# Patient Record
Sex: Male | Born: 1945 | Race: Black or African American | Hispanic: No | Marital: Married | Smoking: Former smoker
Health system: Southern US, Community
[De-identification: ages and names within clinical notes are randomized; demographics above are authoritative.]

## PROBLEM LIST (undated history)

## (undated) DIAGNOSIS — N183 Chronic kidney disease, stage 3 unspecified: Secondary | ICD-10-CM

## (undated) DIAGNOSIS — D649 Anemia, unspecified: Secondary | ICD-10-CM

## (undated) DIAGNOSIS — R339 Retention of urine, unspecified: Secondary | ICD-10-CM

## (undated) DIAGNOSIS — I10 Essential (primary) hypertension: Secondary | ICD-10-CM

## (undated) DIAGNOSIS — M109 Gout, unspecified: Secondary | ICD-10-CM

---

## 2004-02-07 ENCOUNTER — Emergency Department: Payer: Self-pay | Admitting: Emergency Medicine

## 2004-02-07 ENCOUNTER — Other Ambulatory Visit: Payer: Self-pay

## 2004-08-20 ENCOUNTER — Other Ambulatory Visit: Payer: Self-pay

## 2004-08-20 ENCOUNTER — Emergency Department: Payer: Self-pay | Admitting: Emergency Medicine

## 2005-05-19 ENCOUNTER — Emergency Department: Payer: Self-pay | Admitting: Emergency Medicine

## 2005-05-19 ENCOUNTER — Other Ambulatory Visit: Payer: Self-pay

## 2005-05-25 ENCOUNTER — Emergency Department: Payer: Self-pay | Admitting: Emergency Medicine

## 2005-07-30 ENCOUNTER — Emergency Department: Payer: Self-pay | Admitting: Emergency Medicine

## 2005-07-30 ENCOUNTER — Other Ambulatory Visit: Payer: Self-pay

## 2005-09-26 ENCOUNTER — Other Ambulatory Visit: Payer: Self-pay

## 2005-09-26 ENCOUNTER — Inpatient Hospital Stay: Payer: Self-pay | Admitting: Internal Medicine

## 2006-06-30 ENCOUNTER — Emergency Department: Payer: Self-pay | Admitting: Emergency Medicine

## 2006-06-30 ENCOUNTER — Other Ambulatory Visit: Payer: Self-pay

## 2006-10-14 ENCOUNTER — Emergency Department: Payer: Self-pay | Admitting: Emergency Medicine

## 2006-11-05 ENCOUNTER — Other Ambulatory Visit: Payer: Self-pay

## 2006-11-05 ENCOUNTER — Emergency Department: Payer: Self-pay | Admitting: Emergency Medicine

## 2007-01-15 ENCOUNTER — Inpatient Hospital Stay: Payer: Self-pay | Admitting: Internal Medicine

## 2007-01-15 ENCOUNTER — Other Ambulatory Visit: Payer: Self-pay

## 2007-01-19 ENCOUNTER — Other Ambulatory Visit: Payer: Self-pay

## 2007-05-10 ENCOUNTER — Emergency Department: Payer: Self-pay | Admitting: Emergency Medicine

## 2007-05-10 ENCOUNTER — Other Ambulatory Visit: Payer: Self-pay

## 2008-01-24 ENCOUNTER — Emergency Department: Payer: Self-pay | Admitting: Emergency Medicine

## 2008-06-17 ENCOUNTER — Emergency Department: Payer: Self-pay | Admitting: Internal Medicine

## 2008-06-27 ENCOUNTER — Emergency Department: Payer: Self-pay | Admitting: Emergency Medicine

## 2011-03-23 ENCOUNTER — Emergency Department: Payer: Self-pay | Admitting: Emergency Medicine

## 2011-09-26 ENCOUNTER — Emergency Department: Payer: Self-pay | Admitting: Emergency Medicine

## 2011-09-26 LAB — CBC
HGB: 12.9 g/dL — ABNORMAL LOW (ref 13.0–18.0)
MCHC: 33.3 g/dL (ref 32.0–36.0)
MCV: 92 fL (ref 80–100)
Platelet: 110 10*3/uL — ABNORMAL LOW (ref 150–440)
RBC: 4.24 10*6/uL — ABNORMAL LOW (ref 4.40–5.90)
RDW: 16.1 % — ABNORMAL HIGH (ref 11.5–14.5)
WBC: 7.1 10*3/uL (ref 3.8–10.6)

## 2011-09-26 LAB — BASIC METABOLIC PANEL
Anion Gap: 15 (ref 7–16)
BUN: 10 mg/dL (ref 7–18)
Calcium, Total: 9.2 mg/dL (ref 8.5–10.1)
Co2: 21 mmol/L (ref 21–32)
EGFR (African American): 52 — ABNORMAL LOW
EGFR (Non-African Amer.): 45 — ABNORMAL LOW
Glucose: 211 mg/dL — ABNORMAL HIGH (ref 65–99)
Osmolality: 277 (ref 275–301)

## 2011-09-26 LAB — URINALYSIS, COMPLETE
Glucose,UR: 150 mg/dL (ref 0–75)
Leukocyte Esterase: NEGATIVE
Nitrite: NEGATIVE
Specific Gravity: 1.01 (ref 1.003–1.030)
WBC UR: 1 /HPF (ref 0–5)

## 2011-09-26 LAB — PRO B NATRIURETIC PEPTIDE: B-Type Natriuretic Peptide: 441 pg/mL — ABNORMAL HIGH (ref 0–125)

## 2014-03-09 DIAGNOSIS — D649 Anemia, unspecified: Secondary | ICD-10-CM | POA: Insufficient documentation

## 2014-03-09 DIAGNOSIS — N189 Chronic kidney disease, unspecified: Secondary | ICD-10-CM | POA: Insufficient documentation

## 2014-03-09 DIAGNOSIS — I10 Essential (primary) hypertension: Secondary | ICD-10-CM | POA: Insufficient documentation

## 2014-03-09 DIAGNOSIS — N184 Chronic kidney disease, stage 4 (severe): Secondary | ICD-10-CM | POA: Insufficient documentation

## 2016-02-19 DIAGNOSIS — R011 Cardiac murmur, unspecified: Secondary | ICD-10-CM | POA: Diagnosis not present

## 2016-02-19 DIAGNOSIS — N183 Chronic kidney disease, stage 3 (moderate): Secondary | ICD-10-CM | POA: Diagnosis not present

## 2016-02-19 DIAGNOSIS — I10 Essential (primary) hypertension: Secondary | ICD-10-CM | POA: Diagnosis not present

## 2016-02-19 DIAGNOSIS — Z23 Encounter for immunization: Secondary | ICD-10-CM | POA: Diagnosis not present

## 2016-02-19 DIAGNOSIS — M109 Gout, unspecified: Secondary | ICD-10-CM | POA: Diagnosis not present

## 2016-02-19 DIAGNOSIS — E785 Hyperlipidemia, unspecified: Secondary | ICD-10-CM | POA: Diagnosis not present

## 2017-04-16 DIAGNOSIS — Z0000 Encounter for general adult medical examination without abnormal findings: Secondary | ICD-10-CM | POA: Diagnosis not present

## 2017-04-16 DIAGNOSIS — I10 Essential (primary) hypertension: Secondary | ICD-10-CM | POA: Diagnosis not present

## 2017-04-16 DIAGNOSIS — E785 Hyperlipidemia, unspecified: Secondary | ICD-10-CM | POA: Diagnosis not present

## 2017-04-16 DIAGNOSIS — Z1389 Encounter for screening for other disorder: Secondary | ICD-10-CM | POA: Diagnosis not present

## 2017-07-29 ENCOUNTER — Emergency Department: Payer: Medicare HMO

## 2017-07-29 ENCOUNTER — Inpatient Hospital Stay
Admission: EM | Admit: 2017-07-29 | Discharge: 2017-07-31 | DRG: 682 | Disposition: A | Discharge status: Home / Self Care | Payer: Medicare HMO | Attending: Internal Medicine | Admitting: Internal Medicine

## 2017-07-29 ENCOUNTER — Other Ambulatory Visit: Payer: Self-pay

## 2017-07-29 DIAGNOSIS — Z87891 Personal history of nicotine dependence: Secondary | ICD-10-CM | POA: Diagnosis not present

## 2017-07-29 DIAGNOSIS — R3129 Other microscopic hematuria: Secondary | ICD-10-CM | POA: Diagnosis present

## 2017-07-29 DIAGNOSIS — N139 Obstructive and reflux uropathy, unspecified: Secondary | ICD-10-CM | POA: Diagnosis not present

## 2017-07-29 DIAGNOSIS — Z79899 Other long term (current) drug therapy: Secondary | ICD-10-CM

## 2017-07-29 DIAGNOSIS — G9341 Metabolic encephalopathy: Secondary | ICD-10-CM | POA: Diagnosis present

## 2017-07-29 DIAGNOSIS — E871 Hypo-osmolality and hyponatremia: Secondary | ICD-10-CM | POA: Diagnosis present

## 2017-07-29 DIAGNOSIS — R778 Other specified abnormalities of plasma proteins: Secondary | ICD-10-CM

## 2017-07-29 DIAGNOSIS — R319 Hematuria, unspecified: Secondary | ICD-10-CM | POA: Diagnosis not present

## 2017-07-29 DIAGNOSIS — K8020 Calculus of gallbladder without cholecystitis without obstruction: Secondary | ICD-10-CM | POA: Diagnosis present

## 2017-07-29 DIAGNOSIS — E876 Hypokalemia: Secondary | ICD-10-CM | POA: Diagnosis present

## 2017-07-29 DIAGNOSIS — N183 Chronic kidney disease, stage 3 (moderate): Secondary | ICD-10-CM | POA: Diagnosis present

## 2017-07-29 DIAGNOSIS — R079 Chest pain, unspecified: Secondary | ICD-10-CM | POA: Diagnosis not present

## 2017-07-29 DIAGNOSIS — R0602 Shortness of breath: Secondary | ICD-10-CM | POA: Diagnosis not present

## 2017-07-29 DIAGNOSIS — N2889 Other specified disorders of kidney and ureter: Secondary | ICD-10-CM | POA: Diagnosis present

## 2017-07-29 DIAGNOSIS — R339 Retention of urine, unspecified: Secondary | ICD-10-CM | POA: Diagnosis present

## 2017-07-29 DIAGNOSIS — K567 Ileus, unspecified: Secondary | ICD-10-CM

## 2017-07-29 DIAGNOSIS — N1339 Other hydronephrosis: Secondary | ICD-10-CM | POA: Diagnosis not present

## 2017-07-29 DIAGNOSIS — R0789 Other chest pain: Secondary | ICD-10-CM | POA: Diagnosis present

## 2017-07-29 DIAGNOSIS — E861 Hypovolemia: Secondary | ICD-10-CM | POA: Diagnosis present

## 2017-07-29 DIAGNOSIS — N1330 Unspecified hydronephrosis: Secondary | ICD-10-CM | POA: Diagnosis not present

## 2017-07-29 DIAGNOSIS — I129 Hypertensive chronic kidney disease with stage 1 through stage 4 chronic kidney disease, or unspecified chronic kidney disease: Secondary | ICD-10-CM | POA: Diagnosis present

## 2017-07-29 DIAGNOSIS — R809 Proteinuria, unspecified: Secondary | ICD-10-CM | POA: Diagnosis not present

## 2017-07-29 DIAGNOSIS — H9190 Unspecified hearing loss, unspecified ear: Secondary | ICD-10-CM | POA: Diagnosis present

## 2017-07-29 DIAGNOSIS — R7989 Other specified abnormal findings of blood chemistry: Secondary | ICD-10-CM

## 2017-07-29 DIAGNOSIS — N179 Acute kidney failure, unspecified: Secondary | ICD-10-CM | POA: Diagnosis not present

## 2017-07-29 HISTORY — DX: Chronic kidney disease, stage 3 (moderate): N18.3

## 2017-07-29 HISTORY — DX: Anemia, unspecified: D64.9

## 2017-07-29 HISTORY — DX: Chronic kidney disease, stage 3 unspecified: N18.30

## 2017-07-29 HISTORY — DX: Essential (primary) hypertension: I10

## 2017-07-29 LAB — CBC WITH DIFFERENTIAL/PLATELET
Basophils Absolute: 0 10*3/uL (ref 0–0.1)
Basophils Relative: 0 %
EOS ABS: 0 10*3/uL (ref 0–0.7)
EOS PCT: 0 %
HCT: 34.5 % — ABNORMAL LOW (ref 40.0–52.0)
Hemoglobin: 12 g/dL — ABNORMAL LOW (ref 13.0–18.0)
LYMPHS ABS: 0.6 10*3/uL — AB (ref 1.0–3.6)
Lymphocytes Relative: 7 %
MCH: 26.7 pg (ref 26.0–34.0)
MCHC: 34.7 g/dL (ref 32.0–36.0)
MCV: 77.1 fL — AB (ref 80.0–100.0)
MONOS PCT: 11 %
Monocytes Absolute: 1 10*3/uL (ref 0.2–1.0)
Neutro Abs: 7.7 10*3/uL — ABNORMAL HIGH (ref 1.4–6.5)
Neutrophils Relative %: 82 %
PLATELETS: 183 10*3/uL (ref 150–440)
RBC: 4.47 MIL/uL (ref 4.40–5.90)
RDW: 16.4 % — ABNORMAL HIGH (ref 11.5–14.5)
WBC: 9.4 10*3/uL (ref 3.8–10.6)

## 2017-07-29 LAB — URINALYSIS, COMPLETE (UACMP) WITH MICROSCOPIC
Bilirubin Urine: NEGATIVE
GLUCOSE, UA: NEGATIVE mg/dL
Ketones, ur: NEGATIVE mg/dL
Leukocytes, UA: NEGATIVE
NITRITE: NEGATIVE
PH: 6 (ref 5.0–8.0)
Protein, ur: 100 mg/dL — AB
SPECIFIC GRAVITY, URINE: 1.004 — AB (ref 1.005–1.030)
Squamous Epithelial / LPF: NONE SEEN (ref 0–5)

## 2017-07-29 LAB — COMPREHENSIVE METABOLIC PANEL
ALT: 10 U/L — AB (ref 17–63)
AST: 23 U/L (ref 15–41)
Albumin: 4 g/dL (ref 3.5–5.0)
Alkaline Phosphatase: 46 U/L (ref 38–126)
Anion gap: 13 (ref 5–15)
BILIRUBIN TOTAL: 0.7 mg/dL (ref 0.3–1.2)
BUN: 34 mg/dL — ABNORMAL HIGH (ref 6–20)
CHLORIDE: 87 mmol/L — AB (ref 101–111)
CO2: 19 mmol/L — ABNORMAL LOW (ref 22–32)
CREATININE: 3.08 mg/dL — AB (ref 0.61–1.24)
Calcium: 9.2 mg/dL (ref 8.9–10.3)
GFR, EST AFRICAN AMERICAN: 22 mL/min — AB (ref >60)
GFR, EST NON AFRICAN AMERICAN: 19 mL/min — AB (ref >60)
Glucose, Bld: 101 mg/dL — ABNORMAL HIGH (ref 65–99)
Potassium: 3.4 mmol/L — ABNORMAL LOW (ref 3.5–5.1)
Sodium: 119 mmol/L — CL (ref 135–145)
TOTAL PROTEIN: 7.8 g/dL (ref 6.5–8.1)

## 2017-07-29 LAB — CREATININE, SERUM
CREATININE: 3.19 mg/dL — AB (ref 0.61–1.24)
GFR calc Af Amer: 21 mL/min — ABNORMAL LOW (ref >60)
GFR calc non Af Amer: 18 mL/min — ABNORMAL LOW (ref >60)

## 2017-07-29 LAB — MAGNESIUM: MAGNESIUM: 1.7 mg/dL (ref 1.7–2.4)

## 2017-07-29 LAB — LIPASE, BLOOD: LIPASE: 41 U/L (ref 11–51)

## 2017-07-29 LAB — TROPONIN I: Troponin I: 0.03 ng/mL (ref <0.03)

## 2017-07-29 MED ORDER — SODIUM CHLORIDE 0.9 % IV SOLN
INTRAVENOUS | Status: DC
  Administered 2017-07-29 – 2017-07-30 (×3): via INTRAVENOUS

## 2017-07-29 MED ORDER — ACETAMINOPHEN 325 MG PO TABS: 650.0000 mg | ORAL_TABLET | Freq: Four times a day (QID) | ORAL | Status: DC | PRN

## 2017-07-29 MED ORDER — CARVEDILOL 25 MG PO TABS
25.0000 mg | ORAL_TABLET | Freq: Two times a day (BID) | ORAL | Status: DC
  Administered 2017-07-30 – 2017-07-31 (×3): 25 mg via ORAL
  Filled 2017-07-29 (×3): qty 1

## 2017-07-29 MED ORDER — ALBUTEROL SULFATE (2.5 MG/3ML) 0.083% IN NEBU: 2.5000 mg | INHALATION_SOLUTION | RESPIRATORY_TRACT | Status: DC | PRN

## 2017-07-29 MED ORDER — HYDROCODONE-ACETAMINOPHEN 5-325 MG PO TABS: 1.0000 | ORAL_TABLET | ORAL | Status: DC | PRN

## 2017-07-29 MED ORDER — DOXAZOSIN MESYLATE 2 MG PO TABS
2.0000 mg | ORAL_TABLET | Freq: Every day | ORAL | Status: DC
  Administered 2017-07-30 – 2017-07-31 (×2): 2 mg via ORAL
  Filled 2017-07-29 (×2): qty 1

## 2017-07-29 MED ORDER — HEPARIN SODIUM (PORCINE) 5000 UNIT/ML IJ SOLN
5000.0000 [IU] | Freq: Three times a day (TID) | INTRAMUSCULAR | Status: DC
  Administered 2017-07-29 – 2017-07-31 (×5): 5000 [IU] via SUBCUTANEOUS
  Filled 2017-07-29 (×5): qty 1

## 2017-07-29 MED ORDER — ACETAMINOPHEN 650 MG RE SUPP: 650.0000 mg | Freq: Four times a day (QID) | RECTAL | Status: DC | PRN

## 2017-07-29 MED ORDER — IOPAMIDOL (ISOVUE-300) INJECTION 61%: 30.0000 mL | Freq: Once | INTRAVENOUS | Status: DC | PRN

## 2017-07-29 MED ORDER — ONDANSETRON HCL 4 MG PO TABS: 4.0000 mg | ORAL_TABLET | Freq: Four times a day (QID) | ORAL | Status: DC | PRN

## 2017-07-29 MED ORDER — POTASSIUM CHLORIDE CRYS ER 20 MEQ PO TBCR
40.0000 meq | EXTENDED_RELEASE_TABLET | Freq: Once | ORAL | Status: AC
  Administered 2017-07-29: 40 meq via ORAL
  Filled 2017-07-29: qty 2

## 2017-07-29 MED ORDER — AMLODIPINE BESYLATE 5 MG PO TABS
10.0000 mg | ORAL_TABLET | Freq: Every day | ORAL | Status: DC
  Administered 2017-07-30 – 2017-07-31 (×2): 10 mg via ORAL
  Filled 2017-07-29 (×2): qty 2

## 2017-07-29 MED ORDER — PRAVASTATIN SODIUM 40 MG PO TABS
40.0000 mg | ORAL_TABLET | Freq: Every day | ORAL | Status: DC
  Administered 2017-07-30: 40 mg via ORAL
  Filled 2017-07-29: qty 1

## 2017-07-29 MED ORDER — SODIUM CHLORIDE 0.9 % IV SOLN
Freq: Once | INTRAVENOUS | Status: AC
  Administered 2017-07-29: 14:00:00 via INTRAVENOUS

## 2017-07-29 MED ORDER — SENNOSIDES-DOCUSATE SODIUM 8.6-50 MG PO TABS: 1.0000 | ORAL_TABLET | Freq: Every evening | ORAL | Status: DC | PRN

## 2017-07-29 MED ORDER — ONDANSETRON HCL 4 MG/2ML IJ SOLN: 4.0000 mg | Freq: Four times a day (QID) | INTRAMUSCULAR | Status: DC | PRN

## 2017-07-29 MED ORDER — BISACODYL 5 MG PO TBEC: 5.0000 mg | DELAYED_RELEASE_TABLET | Freq: Every day | ORAL | Status: DC | PRN

## 2017-07-29 NOTE — ED Triage Notes (Signed)
Pt c/o having substernal chest pain for the past 2-3 hours with SOB.

## 2017-07-29 NOTE — ED Notes (Signed)
Date and time results received: 07/29/17 1315  Test: Sodium Critical Value: 119  Test: Troponin Critical Value: 0.03  Name of Provider Notified: Cinda Quest, MD  Orders Received? Or Actions Taken?: See new order

## 2017-07-29 NOTE — Progress Notes (Signed)
Received consult for bladder distention with bilateral hydronephrosis presumably secondary to urinary retention.  Foley catheter is now in place.  I will see the patient nonurgently in the morning.  No further intervention at this time, we will follow-up labs tomorrow to assess for improvement in renal function with bladder drainage.    Hollice Espy, MD

## 2017-07-29 NOTE — ED Provider Notes (Signed)
[facility] Emergency Department Provider Note   ____________________________________________   First MD Initiated Contact with Patient 07/29/17 1116     (approximate)  I have reviewed the triage vital signs and the nursing notes.   HISTORY  Chief Complaint Chest Pain    HPI Troy Berry is a 72 y.o. male who comes in with his wife.  His wife says he was breathing hard yesterday.  Today he is breathing hard with exertion and has chest tightness in his chest since this morning.  It does not seem to be worse with exertion.  Shortness of breath may be worse with exertion no.  He also complains of some right upper quadrant pain on palpation on my exam.  This appeared to have just started now.  Low-grade fever he is not coughing though.  Past medical history significant for hypertension is also taking cholesterol-lowering medication.   Past Medical History:  Diagnosis Date  . Hypertension     There are no active problems to display for this patient.   History reviewed. No pertinent surgical history.  Prior to Admission medications   Medication Sig Start Date End Date Taking? Authorizing Provider  amLODipine (NORVASC) 10 MG tablet Take 10 mg by mouth daily.   Yes [provider]  carvedilol (COREG) 25 MG tablet Take 25 mg by mouth 2 (two) times daily with a meal.   Yes [provider]  doxazosin (CARDURA) 2 MG tablet Take 1 tablet by mouth daily. 07/16/17  Yes [provider]  lovastatin (MEVACOR) 40 MG tablet Take 1 tablet by mouth every evening. 07/16/17  Yes [provider]    Allergies Patient has no known allergies.  No family history on file.  Social History Social History   Tobacco Use  . Smoking status: Former Research scientist (life sciences)  . Smokeless tobacco: Never Used  Substance Use Topics  . Alcohol use: Yes  . Drug use: Never    Review of Systems  Constitutional: No fever/chills Eyes: No visual changes. ENT: No  sore throat. Cardiovascular: See HPI Respiratory: See HPI Gastrointestinal: Upper abdominal pain see HPI.  No nausea, no vomiting.  No diarrhea.  No constipation. Genitourinary: Negative for dysuria. Musculoskeletal: Negative for back pain. Skin: Negative for rash. Neurological: Negative for headaches, focal weakness  ____________________________________________   PHYSICAL EXAM:  VITAL SIGNS: ED Triage Vitals [07/29/17 1105]  Enc Vitals Group     BP (!) 175/97     Pulse Rate 83     Resp (!) 24     Temp 99.1 F (37.3 C)     Temp Source Oral     SpO2 96 %     Weight 210 lb (95.3 kg)     Height 5\' 11"  (1.803 m)     Head Circumference      Peak Flow      Pain Score 6     Pain Loc      Pain Edu?      Excl. in [place]?     Constitutional: Alert and oriented. Well appearing and in no acute distress. Eyes: Conjunctivae are normal. Head: Atraumatic. Nose: No congestion/rhinnorhea. Mouth/Throat: Mucous membranes are moist.  Oropharynx non-erythematous. Neck: No stridor. Cardiovascular: Normal rate, regular rhythm. Grossly normal heart sounds.  Good peripheral circulation. Respiratory: Normal respiratory effort.  No retractions. Lungs CTAB. Gastrointestinal: Soft and nontender except on palpation of the right upper quadrant where he says it hurts a little bit when I palpate fairly deeply. No distention. No  abdominal bruits. No CVA tenderness. Musculoskeletal: No lower extremity tenderness trace edema.   Neurologic: Slightly slowed speech and language. No gross focal neurologic deficits are appreciated.. Skin:  Skin is warm, dry and intact. No rash noted. Psychiatric: Mood and affect are normal. Speech and behavior are normal.  ____________________________________________   LABS (all labs ordered are listed, but only abnormal results are displayed)  Labs Reviewed  TROPONIN I - Abnormal; Notable for the following components:      Result Value   Troponin I 0.03 (*)    All other  components within normal limits  CBC WITH DIFFERENTIAL/PLATELET - Abnormal; Notable for the following components:   Hemoglobin 12.0 (*)    HCT 34.5 (*)    MCV 77.1 (*)    RDW 16.4 (*)    Neutro Abs 7.7 (*)    Lymphs Abs 0.6 (*)    All other components within normal limits  COMPREHENSIVE METABOLIC PANEL - Abnormal; Notable for the following components:   Sodium 119 (*)    Potassium 3.4 (*)    Chloride 87 (*)    CO2 19 (*)    Glucose, Bld 101 (*)    BUN 34 (*)    Creatinine, Ser 3.08 (*)    ALT 10 (*)    GFR calc non Af Amer 19 (*)    GFR calc Af Amer 22 (*)    All other components within normal limits  LIPASE, BLOOD  URINALYSIS, COMPLETE (UACMP) WITH MICROSCOPIC   ____________________________________________  EKG  EKG read interpreted by me shows normal sinus rhythm rate of 81 with first-degree AV block there is a suggestion of slight ST elevation but not significant elevation in lead II flipped T's in 1 and L these changes are not much different from previous EKG in 2013 ____________________________________________  RADIOLOGY  ED MD interpretatio chest x-ray shows no obvious acute pathology there is loops of dilated bowel however that probably explains patient's tenderness.  CT of the abdomen was done has multiple results which have copied below.  I have reviewed both films CT and chest x-ray Bilateral hydronephrosis. Urinary bladder is markedly distended into  the lower abdomen. Prostate enlargement.    Cholelithiasis.    Gaseous distention of the right colon and transverse colon. This  presumably is related to mild ileus. Colonic diverticulosis. No  active diverticulitis.    Diffuse severe aortoiliac atherosclerosis and branch vessel  atherosclerosis.    Trace bilateral effusions. Diffuse severe coronary artery disease.    Old granulomatous disease.       Official radiology report(s): Ct Abdomen Pelvis Wo Contrast  Result Date: 07/29/2017 CLINICAL  DATA:  Epigastric pain. Shortness of breath. Dilated bowel noted on chest x-ray. EXAM: CT ABDOMEN AND PELVIS WITHOUT CONTRAST TECHNIQUE: Multidetector CT imaging of the abdomen and pelvis was performed following the standard protocol without IV contrast. COMPARISON:  Chest x-ray performed today FINDINGS: Lower chest: Diffuse extensive coronary artery calcifications. Calcified mediastinal and hilar lymph nodes. Trace pleural effusions bilaterally. Hepatobiliary: Numerous layering gallstones within the gallbladder. Scattered hepatic calcifications. No visible suspicious hepatic abnormality. Pancreas: No focal abnormality or ductal dilatation. Spleen: No focal abnormality.  Normal size. Adrenals/Urinary Tract: Severe renovascular calcifications. Bilateral moderate hydronephrosis. Urinary bladder is markedly distended into the lower abdomen. No visible stones. Adrenal glands are unremarkable. Stomach/Bowel: Gaseous distension of the transverse colon, ascending colon and proximal descending colon. Scattered moderate colonic diverticulosis. No visible active diverticulitis. Moderate stool burden in the right colon. Stomach is moderately distended with  gas and debris. Small bowel decompressed, grossly unremarkable. Vascular/Lymphatic: Diffuse aortic, iliac and branch vessel calcifications. No aneurysm or adenopathy. Reproductive: Enlarged prostate. Other: No free fluid or free air. Musculoskeletal: Diffuse advanced degenerative disc and facet disease in the lumbar spine. IMPRESSION: Bilateral hydronephrosis. Urinary bladder is markedly distended into the lower abdomen. Prostate enlargement. Cholelithiasis. Gaseous distention of the right colon and transverse colon. This presumably is related to mild ileus. Colonic diverticulosis. No active diverticulitis. Diffuse severe aortoiliac atherosclerosis and branch vessel atherosclerosis. Trace bilateral effusions.  Diffuse severe coronary artery disease. Old granulomatous  disease. Electronically Signed   By: Rolm Baptise M.D.   On: 07/29/2017 13:33   Dg Chest 2 View  Result Date: 07/29/2017 CLINICAL DATA:  Chest pain and shortness of breath. EXAM: CHEST - 2 VIEW COMPARISON:  09/26/2011 and 03/23/2011 FINDINGS: Heart size and pulmonary vascularity are normal. Tortuosity of the thoracic aorta. Multiple calcified lymph nodes in the hilar regions, unchanged. No infiltrates or effusions. Slight linear scarring at the right lung base. Dilated bowel loops in the upper abdomen. IMPRESSION: No active cardiopulmonary disease.  Dilated bowel. Electronically Signed   By: Lorriane Shire M.D.   On: 07/29/2017 12:26    ____________________________________________   PROCEDURES  Procedure(s) performed:   Procedures  Critical Care performed: Critical care time 45 minutes including reviewing the films myself reviewing old records speaking with the patient in the hospital Dr.  ____________________________________________   INITIAL IMPRESSION / [place] / ED COURSE           ____________________________________________   FINAL CLINICAL IMPRESSION(S) / ED DIAGNOSES  Final diagnoses:  Chest pain, unspecified type  Elevated troponin  Other hydronephrosis  Ileus ([place])  Hyponatremia  AKI (acute kidney injury) [facility])     ED Discharge Orders    None       Note:  This document was prepared using Dragon voice recognition software and may include unintentional dictation errors.    Nena Polio, MD 07/29/17 1359

## 2017-07-29 NOTE — H&P (Addendum)
[facility] NAME: Troy Berry    MR#:  [id number]  DATE OF BIRTH:  29-Nov-1945  DATE OF ADMISSION:  07/29/2017  PRIMARY CARE PHYSICIAN: [facility] [place]   REQUESTING/REFERRING PHYSICIAN: Dr. Rip Harbour.  CHIEF COMPLAINT:   Chief Complaint  Patient presents with  . Chest Pain   Chest pain on the right side. HISTORY OF PRESENT ILLNESS:  Troy Berry  is a 72 y.o. male with a known history of hypertension.  The patient came to ED due to above chief complaints.  He looks confused, only complains of right-sided chest pain.  His wife said that he has been drinking a lot of water recently. He is found no sodium in the 119 and acute renal failure with elevated troponin at at 3.08. PAST MEDICAL HISTORY:   Past Medical History:  Diagnosis Date  . Anemia   . CKD (chronic kidney disease), stage III ([place])   . Hypertension     PAST SURGICAL HISTORY:  History reviewed. No pertinent surgical history.  SOCIAL HISTORY:   Social History   Tobacco Use  . Smoking status: Former Research scientist (life sciences)  . Smokeless tobacco: Never Used  Substance Use Topics  . Alcohol use: Yes    FAMILY HISTORY:   Family History  Problem Relation Age of Onset  . Cancer Father     DRUG ALLERGIES:  No Known Allergies  REVIEW OF SYSTEMS:   Review of Systems  Unable to perform ROS: Mental status change    MEDICATIONS AT HOME:   Prior to Admission medications   Medication Sig Start Date End Date Taking? Authorizing Provider  amLODipine (NORVASC) 10 MG tablet Take 10 mg by mouth daily.   Yes [provider]  carvedilol (COREG) 25 MG tablet Take 25 mg by mouth 2 (two) times daily with a meal.   Yes [provider]  doxazosin (CARDURA) 2 MG tablet Take 1 tablet by mouth daily. 07/16/17  Yes [provider]  lovastatin (MEVACOR) 40 MG tablet Take 1 tablet by mouth every evening. 07/16/17  Yes [provider]      VITAL SIGNS:  Blood pressure (!) 141/87, pulse 78, temperature 99.1 F (37.3 C), temperature source Oral, resp. rate 20, height 5\' 11"  (1.803 m), weight 210 lb (95.3 kg), SpO2 96 %.  PHYSICAL EXAMINATION:  Physical Exam  GENERAL:  72 y.o.-year-old patient lying in the bed with no acute distress.  EYES: Pupils equal, round, reactive to light and accommodation. No scleral icterus. Extraocular muscles intact.  HEENT: Head atraumatic, normocephalic. Oropharynx and nasopharynx clear.  NECK:  Supple, no jugular venous distention. No thyroid enlargement, no tenderness.  LUNGS: Normal breath sounds bilaterally, no wheezing, rales,rhonchi or crepitation. No use of accessory muscles of respiration.  CARDIOVASCULAR: S1, S2 normal. No murmurs, rubs, or gallops.  ABDOMEN: Soft, nontender, nondistended. Bowel sounds present. No organomegaly or mass.  EXTREMITIES: No pedal edema, cyanosis, or clubbing.  NEUROLOGIC: Cranial nerves II through XII are intact. Muscle strength 5/5 in all extremities. Sensation intact. Gait not checked.  PSYCHIATRIC: The patient is alert and oriented x 2.  SKIN: No obvious rash, lesion, or ulcer.   LABORATORY PANEL:   CBC Recent Labs  Lab 07/29/17 1227  WBC 9.4  HGB 12.0*  HCT 34.5*  PLT 183   ------------------------------------------------------------------------------------------------------------------  Chemistries  Recent Labs  Lab 07/29/17 1227  NA 119*  K 3.4*  CL 87*  CO2 19*  GLUCOSE 101*  BUN  34*  CREATININE 3.08*  CALCIUM 9.2  AST 23  ALT 10*  ALKPHOS 46  BILITOT 0.7   ------------------------------------------------------------------------------------------------------------------  Cardiac Enzymes Recent Labs  Lab 07/29/17 1227  TROPONINI 0.03*   ------------------------------------------------------------------------------------------------------------------  RADIOLOGY:  Ct Abdomen Pelvis Wo Contrast  Result Date:  07/29/2017 CLINICAL DATA:  Epigastric pain. Shortness of breath. Dilated bowel noted on chest x-ray. EXAM: CT ABDOMEN AND PELVIS WITHOUT CONTRAST TECHNIQUE: Multidetector CT imaging of the abdomen and pelvis was performed following the standard protocol without IV contrast. COMPARISON:  Chest x-ray performed today FINDINGS: Lower chest: Diffuse extensive coronary artery calcifications. Calcified mediastinal and hilar lymph nodes. Trace pleural effusions bilaterally. Hepatobiliary: Numerous layering gallstones within the gallbladder. Scattered hepatic calcifications. No visible suspicious hepatic abnormality. Pancreas: No focal abnormality or ductal dilatation. Spleen: No focal abnormality.  Normal size. Adrenals/Urinary Tract: Severe renovascular calcifications. Bilateral moderate hydronephrosis. Urinary bladder is markedly distended into the lower abdomen. No visible stones. Adrenal glands are unremarkable. Stomach/Bowel: Gaseous distension of the transverse colon, ascending colon and proximal descending colon. Scattered moderate colonic diverticulosis. No visible active diverticulitis. Moderate stool burden in the right colon. Stomach is moderately distended with gas and debris. Small bowel decompressed, grossly unremarkable. Vascular/Lymphatic: Diffuse aortic, iliac and branch vessel calcifications. No aneurysm or adenopathy. Reproductive: Enlarged prostate. Other: No free fluid or free air. Musculoskeletal: Diffuse advanced degenerative disc and facet disease in the lumbar spine. IMPRESSION: Bilateral hydronephrosis. Urinary bladder is markedly distended into the lower abdomen. Prostate enlargement. Cholelithiasis. Gaseous distention of the right colon and transverse colon. This presumably is related to mild ileus. Colonic diverticulosis. No active diverticulitis. Diffuse severe aortoiliac atherosclerosis and branch vessel atherosclerosis. Trace bilateral effusions.  Diffuse severe coronary artery disease. Old  granulomatous disease. Electronically Signed   By: Rolm Baptise M.D.   On: 07/29/2017 13:33   Dg Chest 2 View  Result Date: 07/29/2017 CLINICAL DATA:  Chest pain and shortness of breath. EXAM: CHEST - 2 VIEW COMPARISON:  09/26/2011 and 03/23/2011 FINDINGS: Heart size and pulmonary vascularity are normal. Tortuosity of the thoracic aorta. Multiple calcified lymph nodes in the hilar regions, unchanged. No infiltrates or effusions. Slight linear scarring at the right lung base. Dilated bowel loops in the upper abdomen. IMPRESSION: No active cardiopulmonary disease.  Dilated bowel. Electronically Signed   By: Lorriane Shire M.D.   On: 07/29/2017 12:26      IMPRESSION AND PLAN:   ARF on CKD stage 3 The patient will be admitted to medical floor. Start normal saline IV, follow-up BMP and nephrology consult.  Bilateral hydronephrosis, possibly due to urinary retention. Foley catheter and follow-up BMP.  Hyponatremia, NS iv and f/u BMP.  Acute metabolic encephalopathy due to above.  Aspiration fall precaution.  Hypokalemia.  Give potassium supplement and follow-up level. Cholelithiasis per CAT scan.  Asymptomatic. Hypertension.  Continue home hypertension medication.  All the records are reviewed and case discussed with ED provider. Management plans discussed with the patient, family and they are in agreement.  CODE STATUS: Full code  TOTAL TIME TAKING CARE OF THIS PATIENT: 50 minutes.    Demetrios Loll M.D on 07/29/2017 at 2:55 PM  Between 7am to 6pm - Pager - [phone]  After 6pm go to [email or web address] - Technical brewer [facility]  Office  [phone]  CC: Primary care physician; [facility]   Note: This dictation was prepared with Dragon dictation along with smaller phrase technology. Any transcriptional errors that result from this process are unin

## 2017-07-29 NOTE — Progress Notes (Signed)
Advanced Care Plan.  Purpose of Encounter: Code status. Parties in Attendance: The patient and me. Patient's Decisional Capacity: Yes. Medical Story: Troy Berry  is a 72 y.o. male with a known history of hypertension, CKD and anemia.  The patient is being admitted for acute renal failure, hyponatremia and bilateral hydronephrosis due to urine retention.  I discussed with the patient about his current condition, prognosis and the colder status.  The patient wants full code.   Plan:  Code Status: Full code. Time spent discussing advance care planning: 17 minutes.

## 2017-07-29 NOTE — Consult Note (Signed)
[facility]  CONSULT NOTE    Date: 07/29/2017                  Patient Name:  Troy Berry  MRN: [id number]  DOB: 1945/07/17  Age / Sex: 72 y.o., male         PCP: [facility] [place]                 Service Requesting Consult: Hospitalist- Dr. Bridgett Larsson                  Reason for Consult: ARF and hyponatremia             History of Present Illness: [name] is a 72 y.o. black  male with anemia, CKD stage III, HTN , who was admitted to [facility] on 07/29/2017 for chest pain, ARF and hyponatremia.    Patient reports that he was not feeling well this morning.   In the ED the patient was found to have urinary retention. A foley was place and they removed 1.1 L.   Wife is at bedside and patient is a hard of hearing.    Medications: Outpatient medications: Medications Prior to Admission  Medication Sig Dispense Refill Last Dose  . amLODipine (NORVASC) 10 MG tablet Take 10 mg by mouth daily.   07/29/2017 at 0800  . carvedilol (COREG) 25 MG tablet Take 25 mg by mouth 2 (two) times daily with a meal.   07/29/2017 at 0800  . doxazosin (CARDURA) 2 MG tablet Take 1 tablet by mouth daily.   07/29/2017 at 0800  . lovastatin (MEVACOR) 40 MG tablet Take 1 tablet by mouth every evening.   07/28/2017 at 1900    Current medications: Current Facility-Administered Medications  Medication Dose Route Frequency Provider Last Rate Last Dose  . 0.9 %  sodium chloride infusion   Intravenous Continuous Demetrios Loll, MD 100 mL/hr at 07/29/17 1549    . acetaminophen (TYLENOL) tablet 650 mg  650 mg Oral Q6H PRN Demetrios Loll, MD       Or  . acetaminophen (TYLENOL) suppository 650 mg  650 mg Rectal Q6H PRN Demetrios Loll, MD      . albuterol (PROVENTIL) (2.5 MG/3ML) 0.083% nebulizer solution 2.5 mg  2.5 mg Nebulization Q2H PRN Demetrios Loll, MD      . Derrill Memo ON 07/30/2017] amLODipine (NORVASC) tablet 10 mg  10 mg Oral Daily Demetrios Loll, MD      . bisacodyl (DULCOLAX) EC tablet 5 mg   5 mg Oral Daily PRN Demetrios Loll, MD      . carvedilol (COREG) tablet 25 mg  25 mg Oral BID WC Demetrios Loll, MD      . Derrill Memo ON 07/30/2017] doxazosin (CARDURA) tablet 2 mg  2 mg Oral Daily Demetrios Loll, MD      . heparin injection 5,000 Units  5,000 Units Subcutaneous Q8H Demetrios Loll, MD      . HYDROcodone-acetaminophen (NORCO/VICODIN) 5-325 MG per tablet 1-2 tablet  1-2 tablet Oral Q4H PRN Demetrios Loll, MD      . iopamidol (ISOVUE-300) 61 % injection 30 mL  30 mL Oral Once PRN Nena Polio, MD      . ondansetron [facility]) tablet 4 mg  4 mg Oral Q6H PRN Demetrios Loll, MD       Or  . ondansetron [facility]) injection 4 mg  4 mg Intravenous Q6H PRN Demetrios Loll, MD      . potassium chloride SA (  K-DUR,KLOR-CON) CR tablet 40 mEq  40 mEq Oral Once Demetrios Loll, MD      . pravastatin (PRAVACHOL) tablet 40 mg  40 mg Oral q1800 Demetrios Loll, MD      . senna-docusate (Senokot-S) tablet 1 tablet  1 tablet Oral QHS PRN Demetrios Loll, MD          Allergies: No Known Allergies    Past Medical History: Past Medical History:  Diagnosis Date  . Anemia   . CKD (chronic kidney disease), stage III ([place])   . Hypertension      Past Surgical History: History reviewed. No pertinent surgical history.   Family History: Family History  Problem Relation Age of Onset  . Cancer Father      Social History: Social History   Socioeconomic History  . Marital status: Married    Spouse name: Not on file  . Number of children: Not on file  . Years of education: Not on file  . Highest education level: Not on file  Occupational History  . Not on file  Social Needs  . Financial resource strain: Not on file  . Food insecurity:    Worry: Not on file    Inability: Not on file  . Transportation needs:    Medical: Not on file    Non-medical: Not on file  Tobacco Use  . Smoking status: Former Smoker    Types: Cigarettes    Last attempt to quit: 04/07/1998    Years since quitting: 19.3  . Smokeless tobacco: Never Used   Substance and Sexual Activity  . Alcohol use: Yes  . Drug use: Never  . Sexual activity: Yes  Lifestyle  . Physical activity:    Days per week: Not on file    Minutes per session: Not on file  . Stress: Not on file  Relationships  . Social connections:    Talks on phone: Not on file    Gets together: Not on file    Attends religious service: Not on file    Active member of club or organization: Not on file    Attends meetings of clubs or organizations: Not on file    Relationship status: Not on file  . Intimate partner violence:    Fear of current or ex partner: Not on file    Emotionally abused: Not on file    Physically abused: Not on file    Forced sexual activity: Not on file  Other Topics Concern  . Not on file  Social History Narrative  . Not on file     Review of Systems: Review of Systems  Constitutional: Negative for chills, diaphoresis, fever, malaise/fatigue and weight loss.  HENT: Positive for hearing loss. Negative for congestion, ear discharge, ear pain, nosebleeds, sinus pain, sore throat and tinnitus.   Eyes: Negative for blurred vision, double vision, photophobia, pain, discharge and redness.  Respiratory: Negative for cough, hemoptysis, sputum production, shortness of breath, wheezing and stridor.   Cardiovascular: Positive for leg swelling. Negative for chest pain, palpitations, orthopnea, claudication and PND.  Gastrointestinal: Negative for abdominal pain, blood in stool, constipation, diarrhea, heartburn, melena, nausea and vomiting.  Genitourinary: Negative for dysuria, flank pain, frequency, hematuria and urgency.  Musculoskeletal: Negative for back pain, falls, joint pain, myalgias and neck pain.  Skin: Negative for itching and rash.  Neurological: Negative for dizziness, tingling, tremors, sensory change, speech change, focal weakness, seizures, loss of consciousness, weakness and headaches.  Endo/Heme/Allergies: Negative for environmental  allergies and polydipsia.  Does not bruise/bleed easily.  Psychiatric/Behavioral: Negative for depression, hallucinations, memory loss, substance abuse and suicidal ideas. The patient is not nervous/anxious and does not have insomnia.     Vital Signs: Blood pressure (!) 166/80, pulse 74, temperature 98.7 F (37.1 C), temperature source Oral, resp. rate 18, height 5\' 11"  (1.803 m), weight 84.6 kg (186 lb 8.2 oz), SpO2 100 %.  Weight trends: Filed Weights   07/29/17 1105 07/29/17 1550  Weight: 95.3 kg (210 lb) 84.6 kg (186 lb 8.2 oz)    Physical Exam: General: NAD, lying comfortably in bed, patient has difficulty hearing   Head: Normocephalic, atraumatic. dry oral mucosal membranes  Eyes: Anicteric, PERRL  Neck: Supple, trachea midline  Lungs:  Clear to auscultation  Heart: Regular rate and rhythm  Abdomen:  Soft, nontender,   Extremities:  no peripheral edema.  Neurologic: Nonfocal, moving all four extremities  Skin: No lesions  GU  Foley in place, red urine      Lab results: Basic Metabolic Panel: Recent Labs  Lab 07/29/17 1227  NA 119*  K 3.4*  CL 87*  CO2 19*  GLUCOSE 101*  BUN 34*  CREATININE 3.08*  CALCIUM 9.2    Liver Function Tests: Recent Labs  Lab 07/29/17 1227  AST 23  ALT 10*  ALKPHOS 46  BILITOT 0.7  PROT 7.8  ALBUMIN 4.0   Recent Labs  Lab 07/29/17 1227  LIPASE 41   No results for input(s): AMMONIA in the last 168 hours.  CBC: Recent Labs  Lab 07/29/17 1227  WBC 9.4  NEUTROABS 7.7*  HGB 12.0*  HCT 34.5*  MCV 77.1*  PLT 183    Cardiac Enzymes: Recent Labs  Lab 07/29/17 1227  TROPONINI 0.03*    BNP: Invalid input(s): POCBNP  CBG: No results for input(s): GLUCAP in the last 168 hours.  Microbiology: No results found for this or any previous visit.  Coagulation Studies: No results for input(s): LABPROT, INR in the last 72 hours.  Urinalysis: Recent Labs    07/29/17 1455  COLORURINE STRAW*  LABSPEC 1.004*   PHURINE 6.0  GLUCOSEU NEGATIVE  HGBUR MODERATE*  BILIRUBINUR NEGATIVE  KETONESUR NEGATIVE  PROTEINUR 100*  NITRITE NEGATIVE  LEUKOCYTESUR NEGATIVE      Imaging: Ct Abdomen Pelvis Wo Contrast  Result Date: 07/29/2017 CLINICAL DATA:  Epigastric pain. Shortness of breath. Dilated bowel noted on chest x-ray. EXAM: CT ABDOMEN AND PELVIS WITHOUT CONTRAST TECHNIQUE: Multidetector CT imaging of the abdomen and pelvis was performed following the standard protocol without IV contrast. COMPARISON:  Chest x-ray performed today FINDINGS: Lower chest: Diffuse extensive coronary artery calcifications. Calcified mediastinal and hilar lymph nodes. Trace pleural effusions bilaterally. Hepatobiliary: Numerous layering gallstones within the gallbladder. Scattered hepatic calcifications. No visible suspicious hepatic abnormality. Pancreas: No focal abnormality or ductal dilatation. Spleen: No focal abnormality.  Normal size. Adrenals/Urinary Tract: Severe renovascular calcifications. Bilateral moderate hydronephrosis. Urinary bladder is markedly distended into the lower abdomen. No visible stones. Adrenal glands are unremarkable. Stomach/Bowel: Gaseous distension of the transverse colon, ascending colon and proximal descending colon. Scattered moderate colonic diverticulosis. No visible active diverticulitis. Moderate stool burden in the right colon. Stomach is moderately distended with gas and debris. Small bowel decompressed, grossly unremarkable. Vascular/Lymphatic: Diffuse aortic, iliac and branch vessel calcifications. No aneurysm or adenopathy. Reproductive: Enlarged prostate. Other: No free fluid or free air. Musculoskeletal: Diffuse advanced degenerative disc and facet disease in the lumbar spine. IMPRESSION: Bilateral hydronephrosis. Urinary bladder is markedly distended into the lower abdomen. Prostate  enlargement. Cholelithiasis. Gaseous distention of the right colon and transverse colon. This presumably is  related to mild ileus. Colonic diverticulosis. No active diverticulitis. Diffuse severe aortoiliac atherosclerosis and branch vessel atherosclerosis. Trace bilateral effusions.  Diffuse severe coronary artery disease. Old granulomatous disease. Electronically Signed   By: Rolm Baptise M.D.   On: 07/29/2017 13:33   Dg Chest 2 View  Result Date: 07/29/2017 CLINICAL DATA:  Chest pain and shortness of breath. EXAM: CHEST - 2 VIEW COMPARISON:  09/26/2011 and 03/23/2011 FINDINGS: Heart size and pulmonary vascularity are normal. Tortuosity of the thoracic aorta. Multiple calcified lymph nodes in the hilar regions, unchanged. No infiltrates or effusions. Slight linear scarring at the right lung base. Dilated bowel loops in the upper abdomen. IMPRESSION: No active cardiopulmonary disease.  Dilated bowel. Electronically Signed   By: Lorriane Shire M.D.   On: 07/29/2017 12:26      Assessment & Plan: Mr. Nakoa Ganus is a 72 y.o.  male with , anemia, CKD stage III, HTN , who was admitted to [facility] on 07/29/2017 for chest pain, ARF and hyponatremia.    1. Acute Renal Failure likely due to post renal obstruction. Baseline Creatinine of 1.57 on 09/26/11.  - foley in place -IV fluids NS 100 ml/hr   2. Hematuria and proteinuria  - Continue to monitor.  - appreciate urology input   3.Hyponatremia secondary to hypovolemia  - IV fluids in anticipation for post obstructive diuresis   LOS: 0 Neven Fina 4/24/20194:15 PM

## 2017-07-29 NOTE — ED Notes (Signed)
Patient in room comfortable at this time ,medic Andy in room getting IV.

## 2017-07-30 ENCOUNTER — Telehealth: Payer: Self-pay | Admitting: Urology

## 2017-07-30 DIAGNOSIS — N1339 Other hydronephrosis: Secondary | ICD-10-CM

## 2017-07-30 DIAGNOSIS — R339 Retention of urine, unspecified: Secondary | ICD-10-CM

## 2017-07-30 DIAGNOSIS — N179 Acute kidney failure, unspecified: Principal | ICD-10-CM

## 2017-07-30 LAB — CBC
HCT: 28.2 % — ABNORMAL LOW (ref 40.0–52.0)
HEMOGLOBIN: 10 g/dL — AB (ref 13.0–18.0)
MCH: 27.6 pg (ref 26.0–34.0)
MCHC: 35.5 g/dL (ref 32.0–36.0)
MCV: 77.9 fL — AB (ref 80.0–100.0)
Platelets: 147 10*3/uL — ABNORMAL LOW (ref 150–440)
RBC: 3.62 MIL/uL — AB (ref 4.40–5.90)
RDW: 16.2 % — ABNORMAL HIGH (ref 11.5–14.5)
WBC: 6.3 10*3/uL (ref 3.8–10.6)

## 2017-07-30 LAB — BASIC METABOLIC PANEL
Anion gap: 6 (ref 5–15)
BUN: 36 mg/dL — ABNORMAL HIGH (ref 6–20)
CHLORIDE: 99 mmol/L — AB (ref 101–111)
CO2: 20 mmol/L — AB (ref 22–32)
CREATININE: 2.71 mg/dL — AB (ref 0.61–1.24)
Calcium: 8.2 mg/dL — ABNORMAL LOW (ref 8.9–10.3)
GFR calc non Af Amer: 22 mL/min — ABNORMAL LOW (ref >60)
GFR, EST AFRICAN AMERICAN: 25 mL/min — AB (ref >60)
Glucose, Bld: 79 mg/dL (ref 65–99)
POTASSIUM: 3.1 mmol/L — AB (ref 3.5–5.1)
Sodium: 125 mmol/L — ABNORMAL LOW (ref 135–145)

## 2017-07-30 MED ORDER — POTASSIUM CHLORIDE IN NACL 40-0.9 MEQ/L-% IV SOLN
INTRAVENOUS | Status: AC
  Administered 2017-07-30 – 2017-07-31 (×3): 100 mL/h via INTRAVENOUS
  Filled 2017-07-30 (×3): qty 1000

## 2017-07-30 MED ORDER — MAGNESIUM SULFATE 2 GM/50ML IV SOLN
2.0000 g | Freq: Once | INTRAVENOUS | Status: AC
  Administered 2017-07-30: 2 g via INTRAVENOUS
  Filled 2017-07-30: qty 50

## 2017-07-30 MED ORDER — POTASSIUM CHLORIDE CRYS ER 20 MEQ PO TBCR
40.0000 meq | EXTENDED_RELEASE_TABLET | Freq: Once | ORAL | Status: AC
  Administered 2017-07-30: 40 meq via ORAL
  Filled 2017-07-30: qty 2

## 2017-07-30 NOTE — Telephone Encounter (Signed)
APPS MADE   Troy Berry

## 2017-07-30 NOTE — Consult Note (Signed)
Urology Consult  I have been asked to see the patient by Dr. Barrett Shell, for evaluation and management of retention/hydronephrosis.  Chief Complaint: Same as above  History of Present Illness: Troy Berry is a 72 y.o. year old admitted yesterday afternoon to the hospital service with acute on chronic renal failure, urinary retention with 1.1 L with associated moderate hydroureteronephrosis.    The patient initially presented to the emergency room complaining of pain extending across his chest.  He had no urinary complaints including no dysuria, gross hematuria, urgency, frequency, or difficulty voiding.  No obstructive urinary symptoms.  He is found to have an elevated creatinine of about 3 from his baseline of approximately 1.5.  He was also noted to have hyponatremia.  Notably today, the patient is a somewhat poor historian.  It does appear that he is on doxazosin as an outpatient.  Urinalysis does show evidence of microscopic hematuria, otherwise unremarkable.  On chart review, it appears that he had a PSA in 10/2008 which was elevated to 10.4.  It is unclear whether he had any follow-up for this.  There are no additional follow-up labs available.    Past Medical History:  Diagnosis Date  . Anemia   . CKD (chronic kidney disease), stage III ([place])   . Hypertension     History reviewed. No pertinent surgical history.  Home Medications:  Current Meds  Medication Sig  . amLODipine (NORVASC) 10 MG tablet Take 10 mg by mouth daily.  . carvedilol (COREG) 25 MG tablet Take 25 mg by mouth 2 (two) times daily with a meal.  . doxazosin (CARDURA) 2 MG tablet Take 1 tablet by mouth daily.  Marland Kitchen lovastatin (MEVACOR) 40 MG tablet Take 1 tablet by mouth every evening.    Allergies: No Known Allergies  Family History  Problem Relation Age of Onset  . Cancer Father     Social History:  reports that he quit smoking about 19 years ago. His smoking use included cigarettes. He has never  used smokeless tobacco. He reports that he drinks alcohol. He reports that he does not use drugs.  ROS: A complete review of systems was performed.  All systems are negative except for pertinent findings as noted.  Notably today, the patient is a poor historian.  Physical Exam:  Vital signs in last 24 hours: Temp:  [97.8 F (36.6 C)-99.1 F (37.3 C)] 98.6 F (37 C) (04/25 0814) Pulse Rate:  [61-83] 61 (04/25 0814) Resp:  [14-24] 14 (04/25 0814) BP: (120-175)/(70-97) 147/70 (04/25 0814) SpO2:  [96 %-100 %] 100 % (04/25 0814) Weight:  [186 lb 8.2 oz (84.6 kg)-210 lb (95.3 kg)] 186 lb 8.2 oz (84.6 kg) (04/24 1550) Constitutional:  Alert and oriented, No acute distress.  Able to answer most questions but seems to have a lack of understanding. HEENT: [place] AT, moist mucus membranes.  Trachea midline, no masses Cardiovascular: No clubbing, cyanosis, or edema. Respiratory: No increased work of breathing or respiratory distress. GI: Abdomen is soft, nontender, nondistended, no abdominal masses GU: Circumcised phallus with a Foley catheter in place draining clear yellow urine secured to the leg.  Bilateral descended testicles. Skin: No rashes, bruises or suspicious lesions Rectal exam deferred for outpatient follow-up Neurologic: Grossly intact, no focal deficits, moving all 4 extremities Psychiatric: Normal mood and affect   Laboratory Data:  Recent Labs    07/29/17 1227 07/30/17 0416  WBC 9.4 6.3  HGB 12.0* 10.0*  HCT 34.5* 28.2*   Recent Labs  07/29/17 1227 07/30/17 0416  NA 119* 125*  K 3.4* 3.1*  CL 87* 99*  CO2 19* 20*  GLUCOSE 101* 79  BUN 34* 36*  CREATININE 3.19*  3.08* 2.71*  CALCIUM 9.2 8.2*  \  Radiologic Imaging: Ct Abdomen Pelvis Wo Contrast  Result Date: 07/29/2017 CLINICAL DATA:  Epigastric pain. Shortness of breath. Dilated bowel noted on chest x-ray. EXAM: CT ABDOMEN AND PELVIS WITHOUT CONTRAST TECHNIQUE: Multidetector CT imaging of the abdomen and  pelvis was performed following the standard protocol without IV contrast. COMPARISON:  Chest x-ray performed today FINDINGS: Lower chest: Diffuse extensive coronary artery calcifications. Calcified mediastinal and hilar lymph nodes. Trace pleural effusions bilaterally. Hepatobiliary: Numerous layering gallstones within the gallbladder. Scattered hepatic calcifications. No visible suspicious hepatic abnormality. Pancreas: No focal abnormality or ductal dilatation. Spleen: No focal abnormality.  Normal size. Adrenals/Urinary Tract: Severe renovascular calcifications. Bilateral moderate hydronephrosis. Urinary bladder is markedly distended into the lower abdomen. No visible stones. Adrenal glands are unremarkable. Stomach/Bowel: Gaseous distension of the transverse colon, ascending colon and proximal descending colon. Scattered moderate colonic diverticulosis. No visible active diverticulitis. Moderate stool burden in the right colon. Stomach is moderately distended with gas and debris. Small bowel decompressed, grossly unremarkable. Vascular/Lymphatic: Diffuse aortic, iliac and branch vessel calcifications. No aneurysm or adenopathy. Reproductive: Enlarged prostate. Other: No free fluid or free air. Musculoskeletal: Diffuse advanced degenerative disc and facet disease in the lumbar spine. IMPRESSION: Bilateral hydronephrosis. Urinary bladder is markedly distended into the lower abdomen. Prostate enlargement. Cholelithiasis. Gaseous distention of the right colon and transverse colon. This presumably is related to mild ileus. Colonic diverticulosis. No active diverticulitis. Diffuse severe aortoiliac atherosclerosis and branch vessel atherosclerosis. Trace bilateral effusions.  Diffuse severe coronary artery disease. Old granulomatous disease. Electronically Signed   By: Rolm Baptise M.D.   On: 07/29/2017 13:33   Dg Chest 2 View  Result Date: 07/29/2017 CLINICAL DATA:  Chest pain and shortness of breath. EXAM: CHEST  - 2 VIEW COMPARISON:  09/26/2011 and 03/23/2011 FINDINGS: Heart size and pulmonary vascularity are normal. Tortuosity of the thoracic aorta. Multiple calcified lymph nodes in the hilar regions, unchanged. No infiltrates or effusions. Slight linear scarring at the right lung base. Dilated bowel loops in the upper abdomen. IMPRESSION: No active cardiopulmonary disease.  Dilated bowel. Electronically Signed   By: Lorriane Shire M.D.   On: 07/29/2017 12:26    CT scan personally reviewed.  Agree with radiologic interpretation.  Impression/Assessment:  72 year old male presenting with urinary retention with secondary bilateral hydroureteronephrosis and acute renal failure.  Based on his lack of symptoms and marked bladder distention, I suspect this is a chronic issue for him.  Incidental microscopic hematuria, presumably related to prostamegaly and stretch injury of her bladder, however, underlying issues will need to be ruled out.  Plan:  -Maintain Foley catheter for at least a week in order to allow for renal decompression -Replace fluids and electrolytes as needed for postobstructive diuresis -We will arrange for outpatient voiding trial with [facility] teaching as needed next week -We will plan to repeat his PSA/rectal exam as an outpatient once his acute retention has resolved -He will also likely need cystoscopy due to presence of microscopic hematuria.  07/30/2017, 8:49 AM  Hollice Espy,  MD   Urology will sign off, please page with any questions or concerns.

## 2017-07-30 NOTE — Clinical Social Work Note (Signed)
CSW received referral for SNF, PT is recommending not follow up.  CSW to sign off please re-consult if social work needs arise.  Jones Broom. [place], MSW, [place]

## 2017-07-30 NOTE — Telephone Encounter (Signed)
-----   Message from Hollice Espy, MD sent at 07/30/2017  9:03 AM EDT ----- Regarding: outpatient follow up Please arrange for voiding trial next week with a nurse and possible [facility] teaching.  He needs to see urologist in 2 to 3 weeks for PSA/rectal exam/bladder management discussion.   Hollice Espy, MD

## 2017-07-30 NOTE — Evaluation (Signed)
Physical Therapy Evaluation Patient Details Name: Troy Berry MRN: [id number] DOB: 1945/06/02 Today's Date: 07/30/2017   History of Present Illness  72 y/o male who came to [facility] with chest pain, low sodium, admitted with acute renal failure.   Clinical Impression  Pt did relatively well with all aspects of PT.  He was slow and labored with getting to sitting, getting to standing and with circumambulation of the nurses' station but ultimately he needed little more than verbal cues and encouragement with all tasks and reports feeling relatively close to his baseline. We will maintain pt on in-house PT caseload to insure that he remains somewhat active while here but pt states that he feels good about going home and is not interested in HHPT at this time (PT agrees that he is likely not far from his baseline and should be safe at home.)      Follow Up Recommendations No PT follow up    Equipment Recommendations       Recommendations for Other Services       Precautions / Restrictions Precautions Precautions: Fall Restrictions Weight Bearing Restrictions: No      Mobility  Bed Mobility Overal bed mobility: Modified Independent                Transfers Overall transfer level: Modified independent Equipment used: Rolling walker (2 wheeled)             General transfer comment: Pt was able to rise from the low bed setting w/o direct assist, slow to rise with heavy UE use  Ambulation/Gait Ambulation/Gait assistance: Supervision Ambulation Distance (Feet): 200 Feet Assistive device: Rolling walker (2 wheeled)       General Gait Details: Pt was able to ambulate with slow but consistent and safe cadence and though he had some fatigue his vitals remained stable and he was able to circumambulate the nurses' station with relative ease  Stairs            Wheelchair Mobility    Modified Rankin (Stroke Patients Only)       Balance Overall balance assessment:  Modified Independent                                           Pertinent Vitals/Pain Pain Assessment: No/denies pain    Home Living Family/patient expects to be discharged to:: Private residence Living Arrangements: Spouse/significant other   Type of Home: [place] Access: Ramped entrance       [place]: Environmental consultant - 2 wheels      Prior Function Level of Independence: Independent with assistive device(s)         Comments: Pt dresses, showers, is able to get out of the home multiple times per week     Hand Dominance        Extremity/Trunk Assessment   Upper Extremity Assessment Upper Extremity Assessment: Generalized weakness(R grossly 3-/5 shld elevation to ~70, L 3+/5 to ~120,)    Lower Extremity Assessment Lower Extremity Assessment: Overall WFL for tasks assessed;Generalized weakness       Communication   Communication: No difficulties  Cognition Arousal/Alertness: Awake/alert Behavior During Therapy: WFL for tasks assessed/performed Overall Cognitive Status: Within Functional Limits for tasks assessed  General Comments: Pt did have some minimal orientation confusion (date)      General Comments      Exercises     Assessment/Plan    PT Assessment Patient needs continued PT services  PT Problem List Decreased strength;Decreased range of motion;Decreased activity tolerance;Decreased balance;Decreased mobility;Decreased coordination;Decreased cognition;Decreased knowledge of use of DME;Decreased safety awareness       PT Treatment Interventions DME instruction;Gait training;Stair training;Functional mobility training;Therapeutic activities;Balance training;Therapeutic exercise;Neuromuscular re-education;Patient/family education;Cognitive remediation    PT Goals (Current goals can be found in the Care Plan section)  Acute Rehab PT Goals Patient Stated Goal: go home PT Goal  Formulation: With patient Time For Goal Achievement: 08/13/17 Potential to Achieve Goals: Good    Frequency Min 2X/week   Barriers to discharge        Co-evaluation               AM-PAC PT "6 Clicks" Daily Activity  Outcome Measure Difficulty turning over in bed (including adjusting bedclothes, sheets and blankets)?: None Difficulty moving from lying on back to sitting on the side of the bed? : A Little Difficulty sitting down on and standing up from a chair with arms (e.g., wheelchair, bedside commode, etc,.)?: A Little Help needed moving to and from a bed to chair (including a wheelchair)?: None Help needed walking in hospital room?: None Help needed climbing 3-5 steps with a railing? : A Little 6 Click Score: 21    End of Session Equipment Utilized During Treatment: Gait belt Activity Tolerance: Patient tolerated treatment well Patient left: with chair alarm set;with call bell/phone within reach Nurse Communication: Mobility status PT Visit Diagnosis: Muscle weakness (generalized) (M62.81);Difficulty in walking, not elsewhere classified (R26.2)    Time: 4128-7867 PT Time Calculation (min) (ACUTE ONLY): 31 min   Charges:   PT Evaluation $PT Eval Low Complexity: 1 Low PT Treatments $Gait Training: 8-22 mins   PT G Codes:        Kreg Shropshire, DPT 07/30/2017, 5:28 PM

## 2017-07-30 NOTE — Progress Notes (Signed)
[facility] [place] NAME: Troy Berry    MR#:  [id number]  DATE OF BIRTH:  1945/05/26  SUBJECTIVE:  CHIEF COMPLAINT:   Chief Complaint  Patient presents with  . Chest Pain   No complaints.  The patient is more alert in the week. REVIEW OF SYSTEMS:  Review of Systems  Constitutional: Positive for malaise/fatigue. Negative for chills and fever.  HENT: Negative for sore throat.   Eyes: Negative for blurred vision and double vision.  Respiratory: Negative for cough, hemoptysis, shortness of breath, wheezing and stridor.   Cardiovascular: Negative for chest pain, palpitations, orthopnea and leg swelling.  Gastrointestinal: Negative for abdominal pain, blood in stool, diarrhea, melena, nausea and vomiting.  Genitourinary: Negative for dysuria, flank pain and hematuria.  Musculoskeletal: Negative for back pain and joint pain.  Skin: Negative for rash.  Neurological: Negative for dizziness, sensory change, focal weakness, seizures, loss of consciousness, weakness and headaches.  Endo/Heme/Allergies: Negative for polydipsia.  Psychiatric/Behavioral: Negative for depression. The patient is not nervous/anxious.     DRUG ALLERGIES:  No Known Allergies VITALS:  Blood pressure (!) 147/70, pulse 61, temperature 98.6 F (37 C), temperature source Oral, resp. rate 14, height 5\' 11"  (1.803 m), weight 186 lb 8.2 oz (84.6 kg), SpO2 100 %. PHYSICAL EXAMINATION:  Physical Exam  Constitutional: He is oriented to person, place, and time. He appears well-developed.  HENT:  Head: Normocephalic.  Mouth/Throat: Oropharynx is clear and moist.  Eyes: Pupils are equal, round, and reactive to light. Conjunctivae and EOM are normal. No scleral icterus.  Neck: Normal range of motion. Neck supple. No JVD present. No tracheal deviation present.  Cardiovascular: Normal rate, regular rhythm and normal heart sounds. Exam reveals no gallop.  No murmur  heard. Pulmonary/Chest: Effort normal and breath sounds normal. No respiratory distress. He has no wheezes. He has no rales.  Abdominal: Soft. Bowel sounds are normal. He exhibits no distension. There is no tenderness. There is no rebound.  Musculoskeletal: Normal range of motion. He exhibits no edema or tenderness.  Neurological: He is alert and oriented to person, place, and time. No cranial nerve deficit.  Skin: No rash noted. No erythema.  Psychiatric: He has a normal mood and affect.   LABORATORY PANEL:  Male CBC Recent Labs  Lab 07/30/17 0416  WBC 6.3  HGB 10.0*  HCT 28.2*  PLT 147*   ------------------------------------------------------------------------------------------------------------------ Chemistries  Recent Labs  Lab 07/29/17 1227 07/30/17 0416  NA 119* 125*  K 3.4* 3.1*  CL 87* 99*  CO2 19* 20*  GLUCOSE 101* 79  BUN 34* 36*  CREATININE 3.19*  3.08* 2.71*  CALCIUM 9.2 8.2*  MG 1.7  --   AST 23  --   ALT 10*  --   ALKPHOS 46  --   BILITOT 0.7  --    RADIOLOGY:  No results found. ASSESSMENT AND PLAN:   ARF on CKD stage 3, possible due to hydronephrosis. Improving venous Foley catheter and normal saline IV, follow-up BMP.  Bilateral hydronephrosis, due to urinary retention. Keep Foley catheter and follow-up BMP.  Follow-up with urologist as outpatient. Maintain Foley catheter for at least a week in order to allow for renal decompression per Dr. Erlene Quan.  Hyponatremia, improving with NS iv and f/u BMP.  Acute metabolic encephalopathy due to above.  Aspiration fall precaution.  Better.  Hypokalemia.  Given potassium supplement and follow-up level. Cholelithiasis per CAT scan.  Asymptomatic. Hypertension.  Continue home  hypertension medication.  All the records are reviewed and case discussed with Care Management/Social Worker. Management plans discussed with the patient, his wife and they are in agreement.  CODE STATUS: Full Code  TOTAL  TIME TAKING CARE OF THIS PATIENT: 36 minutes.   More than 50% of the time was spent in counseling/coordination of care: YES  POSSIBLE D/C IN 2-3 DAYS, DEPENDING ON CLINICAL CONDITION.   Demetrios Loll M.D on 07/30/2017 at 4:06 PM  Between 7am to 6pm - Pager - [phone]  After 6pm go to [email or web address] - [facility]

## 2017-07-30 NOTE — Progress Notes (Signed)
[facility]  ROUNDING NOTE   Subjective:   Creatinine 2.71 (3.08)  NS at 167mL/hr  Objective:  Vital signs in last 24 hours:  Temp:  [97.8 F (36.6 C)-99.1 F (37.3 C)] 98.6 F (37 C) (04/25 0814) Pulse Rate:  [61-83] 61 (04/25 0814) Resp:  [14-24] 14 (04/25 0814) BP: (120-175)/(70-97) 147/70 (04/25 0814) SpO2:  [96 %-100 %] 100 % (04/25 0814) Weight:  [84.6 kg (186 lb 8.2 oz)-95.3 kg (210 lb)] 84.6 kg (186 lb 8.2 oz) (04/24 1550)  Weight change:  Filed Weights   07/29/17 1105 07/29/17 1550  Weight: 95.3 kg (210 lb) 84.6 kg (186 lb 8.2 oz)    Intake/Output: I/O last 3 completed shifts: In: 1076.7 [I.V.:1076.7] Out: 5300 [Urine:5300]   Intake/Output this shift:  Total I/O In: -  Out: 1300 [Urine:1300]  Physical Exam: General: NAD, laying in bed  Head: Normocephalic, atraumatic. Moist oral mucosal membranes  Eyes: Anicteric, PERRL  Neck: Supple, trachea midline  Lungs:  Clear to auscultation  Heart: Regular rate and rhythm  Abdomen:  Soft, nontender,   Extremities: no peripheral edema.  Neurologic: Nonfocal, moving all four extremities  Skin: No lesions  GU Foley with red urine, trauma with dried blood around urethra    Basic Metabolic Panel: Recent Labs  Lab 07/29/17 1227 07/30/17 0416  NA 119* 125*  K 3.4* 3.1*  CL 87* 99*  CO2 19* 20*  GLUCOSE 101* 79  BUN 34* 36*  CREATININE 3.19*  3.08* 2.71*  CALCIUM 9.2 8.2*  MG 1.7  --     Liver Function Tests: Recent Labs  Lab 07/29/17 1227  AST 23  ALT 10*  ALKPHOS 46  BILITOT 0.7  PROT 7.8  ALBUMIN 4.0   Recent Labs  Lab 07/29/17 1227  LIPASE 41   No results for input(s): AMMONIA in the last 168 hours.  CBC: Recent Labs  Lab 07/29/17 1227 07/30/17 0416  WBC 9.4 6.3  NEUTROABS 7.7*  --   HGB 12.0* 10.0*  HCT 34.5* 28.2*  MCV 77.1* 77.9*  PLT 183 147*    Cardiac Enzymes: Recent Labs  Lab 07/29/17 1227  TROPONINI 0.03*    BNP: Invalid input(s):  POCBNP  CBG: No results for input(s): GLUCAP in the last 168 hours.  Microbiology: No results found for this or any previous visit.  Coagulation Studies: No results for input(s): LABPROT, INR in the last 72 hours.  Urinalysis: Recent Labs    07/29/17 1455  COLORURINE STRAW*  LABSPEC 1.004*  PHURINE 6.0  GLUCOSEU NEGATIVE  HGBUR MODERATE*  BILIRUBINUR NEGATIVE  KETONESUR NEGATIVE  PROTEINUR 100*  NITRITE NEGATIVE  LEUKOCYTESUR NEGATIVE      Imaging: Ct Abdomen Pelvis Wo Contrast  Result Date: 07/29/2017 CLINICAL DATA:  Epigastric pain. Shortness of breath. Dilated bowel noted on chest x-ray. EXAM: CT ABDOMEN AND PELVIS WITHOUT CONTRAST TECHNIQUE: Multidetector CT imaging of the abdomen and pelvis was performed following the standard protocol without IV contrast. COMPARISON:  Chest x-ray performed today FINDINGS: Lower chest: Diffuse extensive coronary artery calcifications. Calcified mediastinal and hilar lymph nodes. Trace pleural effusions bilaterally. Hepatobiliary: Numerous layering gallstones within the gallbladder. Scattered hepatic calcifications. No visible suspicious hepatic abnormality. Pancreas: No focal abnormality or ductal dilatation. Spleen: No focal abnormality.  Normal size. Adrenals/Urinary Tract: Severe renovascular calcifications. Bilateral moderate hydronephrosis. Urinary bladder is markedly distended into the lower abdomen. No visible stones. Adrenal glands are unremarkable. Stomach/Bowel: Gaseous distension of the transverse colon, ascending colon and proximal descending colon. Scattered moderate  colonic diverticulosis. No visible active diverticulitis. Moderate stool burden in the right colon. Stomach is moderately distended with gas and debris. Small bowel decompressed, grossly unremarkable. Vascular/Lymphatic: Diffuse aortic, iliac and branch vessel calcifications. No aneurysm or adenopathy. Reproductive: Enlarged prostate. Other: No free fluid or free air.  Musculoskeletal: Diffuse advanced degenerative disc and facet disease in the lumbar spine. IMPRESSION: Bilateral hydronephrosis. Urinary bladder is markedly distended into the lower abdomen. Prostate enlargement. Cholelithiasis. Gaseous distention of the right colon and transverse colon. This presumably is related to mild ileus. Colonic diverticulosis. No active diverticulitis. Diffuse severe aortoiliac atherosclerosis and branch vessel atherosclerosis. Trace bilateral effusions.  Diffuse severe coronary artery disease. Old granulomatous disease. Electronically Signed   By: Rolm Baptise M.D.   On: 07/29/2017 13:33   Dg Chest 2 View  Result Date: 07/29/2017 CLINICAL DATA:  Chest pain and shortness of breath. EXAM: CHEST - 2 VIEW COMPARISON:  09/26/2011 and 03/23/2011 FINDINGS: Heart size and pulmonary vascularity are normal. Tortuosity of the thoracic aorta. Multiple calcified lymph nodes in the hilar regions, unchanged. No infiltrates or effusions. Slight linear scarring at the right lung base. Dilated bowel loops in the upper abdomen. IMPRESSION: No active cardiopulmonary disease.  Dilated bowel. Electronically Signed   By: Lorriane Shire M.D.   On: 07/29/2017 12:26     Medications:   . 0.9 % NaCl with KCl 40 mEq / L 100 mL/hr (07/30/17 1026)  . magnesium sulfate 1 - 4 g bolus IVPB 2 g (07/30/17 1026)   . amLODipine  10 mg Oral Daily  . carvedilol  25 mg Oral BID WC  . doxazosin  2 mg Oral Daily  . heparin  5,000 Units Subcutaneous Q8H  . pravastatin  40 mg Oral q1800   acetaminophen **OR** acetaminophen, albuterol, bisacodyl, HYDROcodone-acetaminophen, iopamidol, ondansetron **OR** ondansetron (ZOFRAN) IV, senna-docusate  Assessment/ Plan:  [name] is a 72 y.o. black male [name] is a 72 y.o.  male with , anemia, CKD stage III, HTN , who was admitted to [facility] on 07/29/2017 for chest pain, ARF and hyponatremia.    1. Acute Renal Failure likely due to post renal  obstruction. Baseline Creatinine of 1.57 on 09/26/11.  - foley in place - Continue IV fluids  2. Hematuria and proteinuria  - Continue to monitor.  - appreciate urology input   3.Hyponatremia and hypokalemia: with post obstructive diuresis - IV fluids: change to NS with 40KCl      LOS: 1 Troy Berry 4/25/201910:36 AM

## 2017-07-31 LAB — BASIC METABOLIC PANEL
ANION GAP: 6 (ref 5–15)
BUN: 39 mg/dL — ABNORMAL HIGH (ref 6–20)
CO2: 20 mmol/L — AB (ref 22–32)
Calcium: 8.3 mg/dL — ABNORMAL LOW (ref 8.9–10.3)
Chloride: 111 mmol/L (ref 101–111)
Creatinine, Ser: 2.27 mg/dL — ABNORMAL HIGH (ref 0.61–1.24)
GFR calc Af Amer: 31 mL/min — ABNORMAL LOW (ref >60)
GFR calc non Af Amer: 27 mL/min — ABNORMAL LOW (ref >60)
GLUCOSE: 89 mg/dL (ref 65–99)
Potassium: 4 mmol/L (ref 3.5–5.1)
Sodium: 137 mmol/L (ref 135–145)

## 2017-07-31 LAB — CBC
HCT: 28.7 % — ABNORMAL LOW (ref 40.0–52.0)
HEMOGLOBIN: 9.8 g/dL — AB (ref 13.0–18.0)
MCH: 27 pg (ref 26.0–34.0)
MCHC: 33.9 g/dL (ref 32.0–36.0)
MCV: 79.6 fL — ABNORMAL LOW (ref 80.0–100.0)
Platelets: 167 10*3/uL (ref 150–440)
RBC: 3.61 MIL/uL — AB (ref 4.40–5.90)
RDW: 16.4 % — ABNORMAL HIGH (ref 11.5–14.5)
WBC: 6 10*3/uL (ref 3.8–10.6)

## 2017-07-31 LAB — MAGNESIUM: Magnesium: 2.2 mg/dL (ref 1.7–2.4)

## 2017-07-31 NOTE — Evaluation (Addendum)
Occupational Therapy Evaluation Patient Details Name: Troy Berry MRN: [id number] DOB: 06/14/1945 Today's Date: 07/31/2017    History of Present Illness 72 y/o male who came to [facility] with chest pain, low sodium, admitted with acute renal failure.    Clinical Impression    Pt. presents with weakness, and confusion. Pt. Resides at home with with his wife. Pt. reports independence with ADLs. Pt. wife reports the pt. gives her a hard time when she tries to assist with morning care tasks. Pt.'s wife assists with meal preparation, medication management, home management, and household tasks. Pt. Reports that he has not driven in a year.  Follow-up Skilled OT services are not warranted at this time, however pt. could benefit from an in home aide to assist the pt. and wife as needed.     Follow Up Recommendations  No OT follow up    Equipment Recommendations       Recommendations for Other Services       Precautions / Restrictions Precautions Precautions: Fall Restrictions Weight Bearing Restrictions: No      Mobility Bed Mobility Overal bed mobility: Modified Independent                Transfers Overall transfer level: Modified independent Equipment used: Rolling walker (2 wheeled)                  Balance                                           ADL either performed or assessed with clinical judgement   ADL Overall ADL's : Needs assistance/impaired Eating/Feeding: Set up;Independent   Grooming: Set up;Independent   Upper Body Bathing: Set up   Lower Body Bathing: Set up;Minimal assistance   Upper Body Dressing : Set up   Lower Body Dressing: Set up;Minimal assistance                       Vision Baseline Vision/History: Wears glasses Wears Glasses: Reading only Patient Visual Report: No change from baseline       Perception     Praxis      Pertinent Vitals/Pain       Hand Dominance     Extremity/Trunk  Assessment Upper Extremity Assessment Upper Extremity Assessment: Generalized weakness           Communication Communication Communication: No difficulties   Cognition Arousal/Alertness: Awake/alert Behavior During Therapy: WFL for tasks assessed/performed Overall Cognitive Status: Within Functional Limits for tasks assessed                                 General Comments: Minimal confusion, with increased time needed.   General Comments       Exercises     Shoulder Instructions      Home Living Family/patient expects to be discharged to:: Private residence Living Arrangements: Spouse/significant other Available Help at Discharge: Family Type of Home: House Home Access: [place]: One level     Bathroom Shower/Tub: Walk-in shower;Curtain;Door         Home Equipment: Walker - 2 wheels          Prior Functioning/Environment Level of Independence: Independent with assistive device(s)        Comments: Pt. was independent with  basic ADL care. Wife assists pt. As needed, and assists with IADL tasks.Pt. stopped driving approximately one year ago.         OT Problem List: Decreased activity tolerance      OT Treatment/Interventions:      OT Goals(Current goals can be found in the care plan section) Acute Rehab OT Goals Patient Stated Goal: To return home OT Goal Formulation: With patient Potential to Achieve Goals: Good  OT Frequency:     Barriers to D/C:            Co-evaluation              AM-PAC PT "6 Clicks" Daily Activity     Outcome Measure Help from another person eating meals?: None Help from another person taking care of personal grooming?: None Help from another person toileting, which includes using toliet, bedpan, or urinal?: A Little Help from another person bathing (including washing, rinsing, drying)?: A Little Help from another person to put on and taking off regular upper body clothing?:  None Help from another person to put on and taking off regular lower body clothing?: A Little 6 Click Score: 21   End of Session Equipment Utilized During Treatment: Gait belt  Activity Tolerance: Patient tolerated treatment well Patient left: in bed;with call bell/phone within reach;with bed alarm set  OT Visit Diagnosis: Muscle weakness (generalized) (M62.81)                Time: 5397-6734 OT Time Calculation (min): 18 min Charges:  OT General Charges $OT Visit: 1 Visit OT Evaluation $OT Eval Low Complexity: 1 Low G-Codes:    Harrel Carina, MS, OTR/L   Harrel Carina, MS, OTR/L 07/31/2017, 11:54 AM

## 2017-07-31 NOTE — Progress Notes (Signed)
Discharged to home with wife and daughter. Extensive instruction on how to care for the leg bag and how to given peri/foley care.  Wife is nervous about the process. Daughter was formerly a NA and remembers how to do it.

## 2017-07-31 NOTE — Progress Notes (Signed)
[facility]  ROUNDING NOTE   Subjective:   Patient reports no problems at this time. Denies any pain. Wife at bedside.  Creatinine improving 2.27. Potassium improving to 4.0 NS with 40 mEq/L potassium at 100 ml/hr.    Objective:  Vital signs in last 24 hours:  Temp:  [98 F (36.7 C)-99.1 F (37.3 C)] 99.1 F (37.3 C) (04/26 0744) Pulse Rate:  [63-66] 63 (04/26 0744) Resp:  [14-16] 16 (04/26 0744) BP: (133-158)/(75-81) 158/81 (04/26 0744) SpO2:  [99 %-100 %] 100 % (04/26 0744) Weight:  [81 kg (178 lb 8 oz)] 81 kg (178 lb 8 oz) (04/26 0357)  Weight change: -14.3 kg (-31 lb 8 oz) Filed Weights   07/29/17 1105 07/29/17 1550 07/31/17 0357  Weight: 95.3 kg (210 lb) 84.6 kg (186 lb 8.2 oz) 81 kg (178 lb 8 oz)    Intake/Output: I/O last 3 completed shifts: In: 4098 [I.V.:1515] Out: 9300 [Urine:9300]   Intake/Output this shift:  No intake/output data recorded.  Physical Exam: General: NAD, appears comfortable   Head: Normocephalic, atraumatic. Moist oral mucosal membranes  Eyes: Anicteric, PERRL  Neck: Supple, trachea midline  Lungs:  Clear to auscultation  Heart: Regular rate and rhythm  Abdomen:  Soft, nontender,   Extremities:  No peripheral edema.  Neurologic: Nonfocal, moving all four extremities  Skin: No lesions  GU Foley Cath in place     Basic Metabolic Panel: Recent Labs  Lab 07/29/17 1227 07/30/17 0416 07/31/17 0507  NA 119* 125* 137  K 3.4* 3.1* 4.0  CL 87* 99* 111  CO2 19* 20* 20*  GLUCOSE 101* 79 89  BUN 34* 36* 39*  CREATININE 3.19*  3.08* 2.71* 2.27*  CALCIUM 9.2 8.2* 8.3*  MG 1.7  --  2.2    Liver Function Tests: Recent Labs  Lab 07/29/17 1227  AST 23  ALT 10*  ALKPHOS 46  BILITOT 0.7  PROT 7.8  ALBUMIN 4.0   Recent Labs  Lab 07/29/17 1227  LIPASE 41   No results for input(s): AMMONIA in the last 168 hours.  CBC: Recent Labs  Lab 07/29/17 1227 07/30/17 0416 07/31/17 0507  WBC 9.4 6.3 6.0  NEUTROABS 7.7*   --   --   HGB 12.0* 10.0* 9.8*  HCT 34.5* 28.2* 28.7*  MCV 77.1* 77.9* 79.6*  PLT 183 147* 167    Cardiac Enzymes: Recent Labs  Lab 07/29/17 1227  TROPONINI 0.03*    BNP: Invalid input(s): POCBNP  CBG: No results for input(s): GLUCAP in the last 168 hours.  Microbiology: No results found for this or any previous visit.  Coagulation Studies: No results for input(s): LABPROT, INR in the last 72 hours.  Urinalysis: Recent Labs    07/29/17 1455  COLORURINE STRAW*  LABSPEC 1.004*  PHURINE 6.0  GLUCOSEU NEGATIVE  HGBUR MODERATE*  BILIRUBINUR NEGATIVE  KETONESUR NEGATIVE  PROTEINUR 100*  NITRITE NEGATIVE  LEUKOCYTESUR NEGATIVE      Imaging: Ct Abdomen Pelvis Wo Contrast  Result Date: 07/29/2017 CLINICAL DATA:  Epigastric pain. Shortness of breath. Dilated bowel noted on chest x-ray. EXAM: CT ABDOMEN AND PELVIS WITHOUT CONTRAST TECHNIQUE: Multidetector CT imaging of the abdomen and pelvis was performed following the standard protocol without IV contrast. COMPARISON:  Chest x-ray performed today FINDINGS: Lower chest: Diffuse extensive coronary artery calcifications. Calcified mediastinal and hilar lymph nodes. Trace pleural effusions bilaterally. Hepatobiliary: Numerous layering gallstones within the gallbladder. Scattered hepatic calcifications. No visible suspicious hepatic abnormality. Pancreas: No focal abnormality or ductal  dilatation. Spleen: No focal abnormality.  Normal size. Adrenals/Urinary Tract: Severe renovascular calcifications. Bilateral moderate hydronephrosis. Urinary bladder is markedly distended into the lower abdomen. No visible stones. Adrenal glands are unremarkable. Stomach/Bowel: Gaseous distension of the transverse colon, ascending colon and proximal descending colon. Scattered moderate colonic diverticulosis. No visible active diverticulitis. Moderate stool burden in the right colon. Stomach is moderately distended with gas and debris. Small bowel  decompressed, grossly unremarkable. Vascular/Lymphatic: Diffuse aortic, iliac and branch vessel calcifications. No aneurysm or adenopathy. Reproductive: Enlarged prostate. Other: No free fluid or free air. Musculoskeletal: Diffuse advanced degenerative disc and facet disease in the lumbar spine. IMPRESSION: Bilateral hydronephrosis. Urinary bladder is markedly distended into the lower abdomen. Prostate enlargement. Cholelithiasis. Gaseous distention of the right colon and transverse colon. This presumably is related to mild ileus. Colonic diverticulosis. No active diverticulitis. Diffuse severe aortoiliac atherosclerosis and branch vessel atherosclerosis. Trace bilateral effusions.  Diffuse severe coronary artery disease. Old granulomatous disease. Electronically Signed   By: Rolm Baptise M.D.   On: 07/29/2017 13:33   Dg Chest 2 View  Result Date: 07/29/2017 CLINICAL DATA:  Chest pain and shortness of breath. EXAM: CHEST - 2 VIEW COMPARISON:  09/26/2011 and 03/23/2011 FINDINGS: Heart size and pulmonary vascularity are normal. Tortuosity of the thoracic aorta. Multiple calcified lymph nodes in the hilar regions, unchanged. No infiltrates or effusions. Slight linear scarring at the right lung base. Dilated bowel loops in the upper abdomen. IMPRESSION: No active cardiopulmonary disease.  Dilated bowel. Electronically Signed   By: Lorriane Shire M.D.   On: 07/29/2017 12:26     Medications:   . 0.9 % NaCl with KCl 40 mEq / L 100 mL/hr (07/31/17 0746)   . amLODipine  10 mg Oral Daily  . carvedilol  25 mg Oral BID WC  . doxazosin  2 mg Oral Daily  . heparin  5,000 Units Subcutaneous Q8H  . pravastatin  40 mg Oral q1800   acetaminophen **OR** acetaminophen, albuterol, bisacodyl, HYDROcodone-acetaminophen, iopamidol, ondansetron **OR** ondansetron (ZOFRAN) IV, senna-docusate  Assessment/ Plan:  [name] is a 72 y.o. black  male with anemia, CKD stage III, HTN, who was admitted to [facility] on  4/24/2019for chest pain, ARF and hyponatremia.   1. Acute Renal Failure secondary to post obstructive uropathy. Baseline Creatinine of 1.57 on 09/26/11.  - Foley in place, continue to monitor urinary output.  - Encouraged po intake, D/C IV fluids  -creatinine improving.  -urine output of 5.1L yesterday.   -Plan to follow outpatient in 1-2 weeks.   2. Hematuria and proteinuria  -continue to monitor  -appreciate urology input   3. Hyponatremia and hypokalemia with post obstructive diuresis  -hyponatremia resolved. Current sodium of 137  - Hypokalemia resolved. Current potassium of 4.0        LOS: 2 Emi Lymon 4/26/20199:31 AM

## 2017-07-31 NOTE — Care Management Important Message (Signed)
Copy of signed IM left in patient's room.    

## 2017-07-31 NOTE — Discharge Summary (Signed)
[place] [facility] [place] NAME: Troy Berry    MR#:  [id number]  DATE OF BIRTH:  1946/03/08  DATE OF ADMISSION:  07/29/2017   ADMITTING PHYSICIAN: Demetrios Loll, MD  DATE OF DISCHARGE: 07/31/2017  1:55 PM  PRIMARY CARE PHYSICIAN: [facility], [place]   ADMISSION DIAGNOSIS:  Hyponatremia [E87.1] Ileus ([place]) [K56.7] Elevated troponin [R74.8] AKI (acute kidney injury) ([place]) [N17.9] Other hydronephrosis [N13.39] Chest pain, unspecified type [R07.9] DISCHARGE DIAGNOSIS:  Active Problems:   ARF (acute renal failure) ([place])   AKI (acute kidney injury) ([place])   Other hydronephrosis   Urinary retention  SECONDARY DIAGNOSIS:   Past Medical History:  Diagnosis Date  . Anemia   . CKD (chronic kidney disease), stage III ([place])   . Hypertension    HOSPITAL COURSE:   ARFon CKD stage 3, possible due to hydronephrosis. Improving with Foley catheter and normal saline IV, follow-up BMP as outpatient.  Bilateral hydronephrosis,due to urinary retention. Keep Foley catheter and follow-up BMP.  Follow-up with urologist as outpatient. Maintain Foley catheter for at least a week in order to allow for renal decompression per Dr. Erlene Quan.  Hyponatremia, improved with NS iv.  Acute metabolic encephalopathy due to above. Aspiration fall precaution.   Improved.  Hypokalemia. Given potassium supplement and improved. Cholelithiasis per CAT scan.Asymptomatic. Hypertension. Continue home hypertension medication. DISCHARGE CONDITIONS:  Stable, discharge to home today. CONSULTS OBTAINED:  Treatment Team:  Lavonia Dana, MD Hollice Espy, MD DRUG ALLERGIES:  No Known Allergies DISCHARGE MEDICATIONS:   Allergies as of 07/31/2017   No Known Allergies     Medication List    TAKE these medications   amLODipine 10 MG tablet Commonly known as:  NORVASC Take 10 mg by mouth daily.   carvedilol 25 MG tablet Commonly known as:   COREG Take 25 mg by mouth 2 (two) times daily with a meal.   doxazosin 2 MG tablet Commonly known as:  CARDURA Take 1 tablet by mouth daily.   lovastatin 40 MG tablet Commonly known as:  MEVACOR Take 1 tablet by mouth every evening.        DISCHARGE INSTRUCTIONS:  See AVS.  If you experience worsening of your admission symptoms, develop shortness of breath, life threatening emergency, suicidal or homicidal thoughts you must seek medical attention immediately by calling 911 or calling your MD immediately  if symptoms less severe.  You Must read complete instructions/literature along with all the possible adverse reactions/side effects for all the Medicines you take and that have been prescribed to you. Take any new Medicines after you have completely understood and accpet all the possible adverse reactions/side effects.   Please note  You were cared for by a hospitalist during your hospital stay. If you have any questions about your discharge medications or the care you received while you were in the hospital after you are discharged, you can call the unit and asked to speak with the hospitalist on call if the hospitalist that took care of you is not available. Once you are discharged, your primary care physician will handle any further medical issues. Please note that NO REFILLS for any discharge medications will be authorized once you are discharged, as it is imperative that you return to your primary care physician (or establish a relationship with a primary care physician if you do not have one) for your aftercare needs so that they can reassess your need for medications and monitor your lab values.  On the day of Discharge:  VITAL SIGNS:  Blood pressure (!) 158/81, pulse 63, temperature 99.1 F (37.3 C), temperature source Oral, resp. rate 16, height 5\' 11"  (1.803 m), weight 178 lb 8 oz (81 kg), SpO2 100 %. PHYSICAL EXAMINATION:  GENERAL:  72 y.o.-year-old patient lying in the  bed with no acute distress.  EYES: Pupils equal, round, reactive to light and accommodation. No scleral icterus. Extraocular muscles intact.  HEENT: Head atraumatic, normocephalic. Oropharynx and nasopharynx clear.  NECK:  Supple, no jugular venous distention. No thyroid enlargement, no tenderness.  LUNGS: Normal breath sounds bilaterally, no wheezing, rales,rhonchi or crepitation. No use of accessory muscles of respiration.  CARDIOVASCULAR: S1, S2 normal. No murmurs, rubs, or gallops.  ABDOMEN: Soft, non-tender, non-distended. Bowel sounds present. No organomegaly or mass.  EXTREMITIES: No pedal edema, cyanosis, or clubbing.  NEUROLOGIC: Cranial nerves II through XII are intact. Muscle strength 5/5 in all extremities. Sensation intact. Gait not checked.  PSYCHIATRIC: The patient is alert and oriented x 3.  SKIN: No obvious rash, lesion, or ulcer.  DATA REVIEW:   CBC Recent Labs  Lab 07/31/17 0507  WBC 6.0  HGB 9.8*  HCT 28.7*  PLT 167    Chemistries  Recent Labs  Lab 07/29/17 1227  07/31/17 0507  NA 119*   < > 137  K 3.4*   < > 4.0  CL 87*   < > 111  CO2 19*   < > 20*  GLUCOSE 101*   < > 89  BUN 34*   < > 39*  CREATININE 3.19*  3.08*   < > 2.27*  CALCIUM 9.2   < > 8.3*  MG 1.7  --  2.2  AST 23  --   --   ALT 10*  --   --   ALKPHOS 46  --   --   BILITOT 0.7  --   --    < > = values in this interval not displayed.     Microbiology Results  No results found for this or any previous visit.  RADIOLOGY:  No results found.   Management plans discussed with the patient, his wife and they are in agreement.  CODE STATUS: Full Code   TOTAL TIME TAKING CARE OF THIS PATIENT: 33 minutes.    Demetrios Loll M.D on 07/31/2017 at 4:51 PM  Between 7am to 6pm - Pager - [phone]  After 6pm go to [email or web address] - [employer] [facility]  Office  [phone]  CC: Primary care physician; Center, [facility]   Note: This  dictation was prepared with Dragon dictation along with smaller phrase technology. Any transcriptional errors that result from this process are unintentional.

## 2017-07-31 NOTE — Discharge Instructions (Signed)
Keep Foley and f/u Dr. Erlene Quan in 1 week.

## 2017-07-31 NOTE — Care Management (Signed)
No discharge needs identified by members of the care team 

## 2017-07-31 NOTE — Plan of Care (Signed)
Supported by wife and children.  Ready for discharge

## 2017-08-03 ENCOUNTER — Ambulatory Visit (INDEPENDENT_AMBULATORY_CARE_PROVIDER_SITE_OTHER): Payer: Medicare HMO

## 2017-08-03 ENCOUNTER — Telehealth: Payer: Self-pay

## 2017-08-03 VITALS — BP 146/83 | HR 73 | Ht 70.0 in

## 2017-08-03 DIAGNOSIS — R339 Retention of urine, unspecified: Secondary | ICD-10-CM

## 2017-08-03 NOTE — Progress Notes (Signed)
Fill and Pull Catheter Removal  Patient is present today for a catheter removal.  Patient was cleaned and prepped in a sterile fashion 259ml of sterile water/ saline was instilled into the bladder when the patient felt the urge to urinate. 39ml of water was then drained from the balloon.  A 16FR foley cath was removed from the bladder no complications were noted .  Patient as then given some time to void on their own.  Patient cannot void their own after some time.  Patient tolerated well.  Preformed by: Fonnie Jarvis, CMA  Simple Catheter Placement  Due to urinary retention patient is present today for a foley cath placement.  Patient was cleaned and prepped in a sterile fashion with betadine and lidocaine jelly 2% was instilled into the urethra.  A 16 FR foley catheter was inserted, urine return was noted  244ml, urine was clear in color.  The balloon was filled with 10cc of sterile water.  A night bag was attached for drainage. Patient was also given a night bag to take home and was given instruction on how to change from one bag to another.  Patient was given instruction on proper catheter care.  Patient tolerated well, no complications were noted   Preformed by: Fonnie Jarvis, CMA  Additional notes/ Follow up: 1 week voiding trial

## 2017-08-03 NOTE — Telephone Encounter (Signed)
Flagged on EMMI report for not receiving discharge papers and not knowing who to contact for changes in condition.   Called and spoke with patient's wife, Troy Berry, as patient was laying down resting at time of call.  Per wife, they did receive discharge papers and still had them at home.  Relayed to follow up with Dr. Erlene Quan and [facility] for any questions that arise regarding patient's condition.   Wife did mentioned patient may benefit from physical therapy as he struggles using walker and she has issues assisting him.  Said their son-in-law helps whenever able.  I told her to mention her request to [facility] when he sees his PCP on 08/07/17.   No other questions or concerns currently.   I thanked her for her time and informed her that the patient would receive one more automated call in the next few days checking on him one more time.

## 2017-08-07 DIAGNOSIS — N179 Acute kidney failure, unspecified: Secondary | ICD-10-CM | POA: Diagnosis not present

## 2017-08-07 DIAGNOSIS — R5381 Other malaise: Secondary | ICD-10-CM | POA: Diagnosis not present

## 2017-08-11 ENCOUNTER — Ambulatory Visit (INDEPENDENT_AMBULATORY_CARE_PROVIDER_SITE_OTHER): Payer: Medicare HMO

## 2017-08-11 DIAGNOSIS — R339 Retention of urine, unspecified: Secondary | ICD-10-CM

## 2017-08-11 NOTE — Progress Notes (Signed)
Fill and Pull Catheter Removal  Patient is present today for a catheter removal.  Patient was cleaned and prepped in a sterile fashion 32ml of sterile water/ saline was instilled into the bladder when the patient felt the urge to urinate. 63ml of water was then drained from the balloon.  A 16FR foley cath was removed from the bladder no complications were noted .  Patient as then given some time to void on their own.  Patient cannot void on their own after some time.  Patient tolerated well.   Simple Catheter Placement  Due to urinary retention patient is present today for a foley cath placement.  Patient was cleaned and prepped in a sterile fashion with betadine and lidocaine jelly 2% was instilled into the urethra.  A 16 FR foley catheter was inserted, urine return was noted  213ml, saline was clear in color.  The balloon was filled with 10cc of sterile water.  A leg bag was attached for drainage. Patient was also given a night bag to take home and was given instruction on how to change from one bag to another.  Patient was given instruction on proper catheter care.  Patient tolerated well, no complications were noted   Preformed by: Gordy Clement, CMA  Additional notes/ Follow up: with provider to access, pt scheduled accordingly

## 2017-08-21 ENCOUNTER — Encounter: Payer: Self-pay | Admitting: Urology

## 2017-08-21 ENCOUNTER — Ambulatory Visit (INDEPENDENT_AMBULATORY_CARE_PROVIDER_SITE_OTHER): Payer: Medicare HMO | Admitting: Urology

## 2017-08-21 VITALS — BP 120/79 | HR 76 | Temp 98.7°F | Resp 16 | Wt 169.3 lb

## 2017-08-21 DIAGNOSIS — R339 Retention of urine, unspecified: Secondary | ICD-10-CM

## 2017-08-21 NOTE — Progress Notes (Signed)
08/21/2017 2:20 PM   Troy Berry 11/26/1945 [id number]  Referring provider: Center, [facility] [place], [place] [address]  Chief Complaint  Patient presents with  . Elevated PSA  . Urinary Retention    HPI: The patient is a 72 year old gentleman who was recently seen in the hospital for acute on chronic renal failure, 1.1 L urinary retention, and associated bilateral hydroureteronephrosis who presents today for follow-up.  With Foley catheter placement, the retention as well as the hydroureteronephrosis and acute kidney injury resolved.  He also has been seen in our office on more than one occasion and has failed a trial of void.  He was also noted in the hospital to have microscopic hematuria.  He did have a CT without contrast that showed the after mentioned bilateral hydroureteronephrosis and urinary retention but no other GU pathology was noted.  Patient has a distant history of a in July 2000% of the PSA of 10.4.  The patient is a poor historian and does not provide any reliable history.  He is doxazosin according to his chart though when asked directly he was not sure if he was taking this medication.   PMH: Past Medical History:  Diagnosis Date  . Anemia   . CKD (chronic kidney disease), stage III ([place])   . Hypertension     Surgical History: History reviewed. No pertinent surgical history.  Home Medications:  Allergies as of 08/21/2017      Reactions   Ace Inhibitors    Other reaction(s): Other (See Comments) lip swelling      Medication List        Accurate as of 08/21/17  2:20 PM. Always use your most recent med list.          amLODipine 10 MG tablet Commonly known as:  NORVASC Take 10 mg by mouth daily.   carvedilol 25 MG tablet Commonly known as:  COREG Take 25 mg by mouth 2 (two) times daily with a meal.   doxazosin 2 MG tablet Commonly known as:  CARDURA Take 1 tablet by mouth daily.   lovastatin 40 MG  tablet Commonly known as:  MEVACOR Take 1 tablet by mouth every evening.       Allergies:  Allergies  Allergen Reactions  . Ace Inhibitors     Other reaction(s): Other (See Comments) lip swelling    Family History: Family History  Problem Relation Age of Onset  . Cancer Father     Social History:  reports that he quit smoking about 19 years ago. His smoking use included cigarettes. He has never used smokeless tobacco. He reports that he drinks alcohol. He reports that he does not use drugs.  ROS: UROLOGY Frequent Urination?: No Hard to postpone urination?: No Burning/pain with urination?: Yes Get up at night to urinate?: No Leakage of urine?: No Urine stream starts and stops?: No Trouble starting stream?: No Do you have to strain to urinate?: No Blood in urine?: No Urinary tract infection?: No Sexually transmitted disease?: No Injury to kidneys or bladder?: No Painful intercourse?: No Weak stream?: No Erection problems?: No Penile pain?: No  Gastrointestinal Nausea?: No Vomiting?: No Indigestion/heartburn?: No Diarrhea?: No Constipation?: No  Constitutional Fever: No Night sweats?: No Weight loss?: No Fatigue?: No  Skin Skin rash/lesions?: No Itching?: No  Eyes Blurred vision?: No Double vision?: No  Ears/Nose/Throat Sore throat?: No Sinus problems?: No  Hematologic/Lymphatic Swollen glands?: No Easy bruising?: No  Cardiovascular Leg swelling?: No Chest pain?:  No  Respiratory Cough?: No Shortness of breath?: No  Endocrine Excessive thirst?: No  Musculoskeletal Back pain?: No Joint pain?: No  Neurological Headaches?: No Dizziness?: No  Psychologic Depression?: No Anxiety?: No  Physical Exam: BP 120/79   Pulse 76   Temp 98.7 F (37.1 C) (Oral)   Resp 16   Wt 169 lb 4.8 oz (76.8 kg)   SpO2 99%   BMI 24.29 kg/m   Constitutional:  Alert and oriented, No acute distress. HEENT: [place] AT, moist mucus membranes.  Trachea  midline, no masses. Cardiovascular: No clubbing, cyanosis, or edema. Respiratory: Normal respiratory effort, no increased work of breathing. GI: Abdomen is soft, nontender, nondistended, no abdominal masses GU: No CVA tenderness.  Foley in place Skin: No rashes, bruises or suspicious lesions. Lymph: No cervical or inguinal adenopathy. Neurologic: Grossly intact, no focal deficits, moving all 4 extremities. Psychiatric: Normal mood and affect.  Laboratory Data: Lab Results  Component Value Date   WBC 6.0 07/31/2017   HGB 9.8 (L) 07/31/2017   HCT 28.7 (L) 07/31/2017   MCV 79.6 (L) 07/31/2017   PLT 167 07/31/2017    Lab Results  Component Value Date   CREATININE 2.27 (H) 07/31/2017    No results found for: PSA  No results found for: TESTOSTERONE  No results found for: HGBA1C  Urinalysis    Component Value Date/Time   COLORURINE STRAW (A) 07/29/2017 1455   APPEARANCEUR CLEAR (A) 07/29/2017 1455   APPEARANCEUR Hazy 09/26/2011 1609   LABSPEC 1.004 (L) 07/29/2017 1455   LABSPEC 1.010 09/26/2011 1609   PHURINE 6.0 07/29/2017 1455   GLUCOSEU NEGATIVE 07/29/2017 1455   GLUCOSEU 150 mg/dL 09/26/2011 1609   HGBUR MODERATE (A) 07/29/2017 1455   BILIRUBINUR NEGATIVE 07/29/2017 1455   BILIRUBINUR Negative 09/26/2011 1609   KETONESUR NEGATIVE 07/29/2017 1455   PROTEINUR 100 (A) 07/29/2017 1455   NITRITE NEGATIVE 07/29/2017 1455   LEUKOCYTESUR NEGATIVE 07/29/2017 1455   LEUKOCYTESUR Negative 09/26/2011 1609    Assessment & Plan:    1.  BPH 2.  Urinary retention 3.  Microscopic hematuria 4.  History of elevated PSA -The patient has failed multiple trials of void for high volume urinary retention resulting in AK I and hydroureteronephrosis.  At this point, the etiology of his urinary retention is not clear.  The patient also does not provide any reliable history to understand his baseline voiding function.  For now we will continue the catheter and doxazosin.  I will have him  undergo a urodynamic study to better assess his bladder function.  He will follow-up after the urodynamic study in the morning for cystoscopy and possible repeat trial of void.  Return for after UDS, early AM appt for cysto and possible TOV.  Nickie Retort, MD  [facility] [address] [place], [place] [address] [phone]

## 2017-09-27 ENCOUNTER — Emergency Department
Admission: EM | Admit: 2017-09-27 | Discharge: 2017-09-27 | Disposition: A | Discharge status: Home / Self Care | Payer: Medicare HMO | Attending: Emergency Medicine | Admitting: Emergency Medicine

## 2017-09-27 DIAGNOSIS — N183 Chronic kidney disease, stage 3 (moderate): Secondary | ICD-10-CM | POA: Diagnosis not present

## 2017-09-27 DIAGNOSIS — N390 Urinary tract infection, site not specified: Secondary | ICD-10-CM | POA: Diagnosis not present

## 2017-09-27 DIAGNOSIS — Y658 Other specified misadventures during surgical and medical care: Secondary | ICD-10-CM | POA: Diagnosis not present

## 2017-09-27 DIAGNOSIS — T83511A Infection and inflammatory reaction due to indwelling urethral catheter, initial encounter: Secondary | ICD-10-CM | POA: Diagnosis not present

## 2017-09-27 DIAGNOSIS — Z87891 Personal history of nicotine dependence: Secondary | ICD-10-CM | POA: Insufficient documentation

## 2017-09-27 DIAGNOSIS — I129 Hypertensive chronic kidney disease with stage 1 through stage 4 chronic kidney disease, or unspecified chronic kidney disease: Secondary | ICD-10-CM | POA: Insufficient documentation

## 2017-09-27 DIAGNOSIS — Z79899 Other long term (current) drug therapy: Secondary | ICD-10-CM | POA: Insufficient documentation

## 2017-09-27 DIAGNOSIS — R8290 Unspecified abnormal findings in urine: Secondary | ICD-10-CM | POA: Diagnosis present

## 2017-09-27 LAB — URINALYSIS, COMPLETE (UACMP) WITH MICROSCOPIC
BILIRUBIN URINE: NEGATIVE
Glucose, UA: NEGATIVE mg/dL
KETONES UR: NEGATIVE mg/dL
Nitrite: NEGATIVE
PROTEIN: 100 mg/dL — AB
SQUAMOUS EPITHELIAL / LPF: NONE SEEN (ref 0–5)
Specific Gravity, Urine: 1.015 (ref 1.005–1.030)
pH: 7 (ref 5.0–8.0)

## 2017-09-27 MED ORDER — LEVOFLOXACIN 750 MG PO TABS: 750.0000 mg | ORAL_TABLET | Freq: Every day | ORAL | 0 refills | Status: AC

## 2017-09-27 MED ORDER — LEVOFLOXACIN 750 MG PO TABS
750.0000 mg | ORAL_TABLET | Freq: Once | ORAL | Status: AC
  Administered 2017-09-27: 750 mg via ORAL
  Filled 2017-09-27: qty 1

## 2017-09-27 NOTE — Discharge Instructions (Addendum)
You are being treated for a UTI related to your catheter. Take the prescription antibiotic as directed. Follow-up with your urologist for further management. Return to the Emergency Department of any concerning changes to the urine. A urine culture is pending.

## 2017-09-27 NOTE — ED Notes (Signed)
Pt wife states that his urine has had a strong odor for a while now and she suspects an infection. Pt alert and oriented x 4.

## 2017-09-27 NOTE — ED Provider Notes (Signed)
[facility] Emergency Department Provider Note ____________________________________________  Time seen: 1605  I have reviewed the triage vital signs and the nursing notes.  HISTORY  Chief Complaint  Urinary Tract Infection  HPI Troy Nick. is a 72 y.o. male with CKD, BPH, and hydroureteronephorisis, presents to the ED accompanied by his wife, for evaluation of indwelling urinary catheter problems.  The wife reports that the urine is draining into the bag is still yellow but she notes a small smell from the urine.  She is also noted that there is a black sediment in the bag and she is concerned.  She reports that he has had no fevers, nausea, or vomiting.  Patient feels like he is completely empty in his bladder denies any bladder fullness, retention, or dysuria.  The Foley has been in place for approximately 8 weeks, due to urinary retention..  An attempt was made to discontinue the catheter on 08/11/2017, but the patient is unable to void spontaneously after catheter removal.  Patient had a catheter replaced at that time.  Patient was last seen about 3 weeks prior at urology, for continued assessment of his BPH and urinary retention without a clear etiology.  Past Medical History:  Diagnosis Date  . Anemia   . CKD (chronic kidney disease), stage III ([place])   . Hypertension     Patient Active Problem List   Diagnosis Date Noted  . AKI (acute kidney injury) ([place])   . Other hydronephrosis   . Urinary retention   . ARF (acute renal failure) ([place]) 07/29/2017    History reviewed. No pertinent surgical history.  Prior to Admission medications   Medication Sig Start Date End Date Taking? Authorizing Provider  amLODipine (NORVASC) 10 MG tablet Take 10 mg by mouth daily.    [provider]  carvedilol (COREG) 25 MG tablet Take 25 mg by mouth 2 (two) times daily with a meal.    [provider]  doxazosin (CARDURA) 2 MG tablet Take 1 tablet by  mouth daily. 07/16/17   [provider]  levofloxacin (LEVAQUIN) 750 MG tablet Take 1 tablet (750 mg total) by mouth daily for 7 days. 09/27/17 10/04/17  Lashonda Sonneborn, Dannielle Karvonen, PA-C  lovastatin (MEVACOR) 40 MG tablet Take 1 tablet by mouth every evening. 07/16/17   [provider]    Allergies Ace inhibitors  Family History  Problem Relation Age of Onset  . Cancer Father     Social History Social History   Tobacco Use  . Smoking status: Former Smoker    Types: Cigarettes    Last attempt to quit: 04/07/1998    Years since quitting: 19.4  . Smokeless tobacco: Never Used  Substance Use Topics  . Alcohol use: Not Currently  . Drug use: Never    Review of Systems  Constitutional: Negative for fever. Cardiovascular: Negative for chest pain. Respiratory: Negative for shortness of breath. Gastrointestinal: Negative for abdominal pain, vomiting and diarrhea. Genitourinary: Negative for dysuria. Reports malodorous urine with indwelling catheter Musculoskeletal: Negative for back pain. Skin: Negative for rash. Neurological: Negative for headaches, focal weakness or numbness. ____________________________________________  PHYSICAL EXAM:  VITAL SIGNS: ED Triage Vitals [09/27/17 1512]  Enc Vitals Group     BP 123/77     Pulse Rate 71     Resp 18     Temp 98.2 F (36.8 C)     Temp Source Oral     SpO2 99 %     Weight 169  lb (76.7 kg)     Height 5\' 10"  (1.778 m)     Head Circumference      Peak Flow      Pain Score 0     Pain Loc      Pain Edu?      Excl. in [place]?     Constitutional: Alert and oriented. Well appearing and in no distress. Head: Normocephalic and atraumatic. Eyes: Conjunctivae are normal. Normal extraocular movements Cardiovascular: Normal rate, regular rhythm. Normal distal pulses. Respiratory: Normal respiratory effort. No wheezes/rales/rhonchi. Gastrointestinal: Soft and nontender. No distention. Psychiatric: Mood and affect are normal.  Patient exhibits appropriate insight and judgment. ____________________________________________   LABS (pertinent positives/negatives)  Labs Reviewed  URINALYSIS, COMPLETE (UACMP) WITH MICROSCOPIC - Abnormal; Notable for the following components:      Result Value   Color, Urine AMBER (*)    APPearance CLOUDY (*)    Hgb urine dipstick SMALL (*)    Protein, ur 100 (*)    Leukocytes, UA SMALL (*)    WBC, UA >50 (*)    Bacteria, UA MANY (*)    Non Squamous Epithelial 0-5 (*)    All other components within normal limits  URINE CULTURE  ____________________________________________  PROCEDURES  Procedures Levofloxacin 750 mg PO Foley catheter/bag replaced ____________________________________________  INITIAL IMPRESSION / ASSESSMENT AND PLAN / ED COURSE  Patient with ED evaluation of suspected UTI secondary to indwelling urinary catheter.  Patient's urinary cath was replaced and flushes clearly after insertion.  Patient is discharged after initial ED dose with Levaquin 750 daily for 7 days.  He will follow-up with [facility] for further evaluation and management.  Return precautions have been reviewed with the patient and his wife, who verbalizes understanding.  Urine culture is pending at the time of discharge. ____________________________________________  FINAL CLINICAL IMPRESSION(S) / ED DIAGNOSES  Final diagnoses:  Urinary tract infection associated with indwelling urethral catheter, initial encounter [facility])      Melvenia Needles, PA-C 09/27/17 [place], [place], MD 09/29/17 2039

## 2017-09-27 NOTE — ED Triage Notes (Signed)
Per pt's wife, pt's indwelling leg bag has turned black.  Per wife, the urine draining is still yellow in color.  Foul smell noted to patient.  Wife concerned about color of bag, but states she doesn't think that patient has a UTI.  Pt states that he feels like his bladder is emptying and has no pain in bladder.  Per wife pt has not follow up with urologist yet, but she believes that the foley was placed approximately a month ago.

## 2017-09-28 LAB — URINE CULTURE: Special Requests: NORMAL

## 2017-10-26 DIAGNOSIS — Z87891 Personal history of nicotine dependence: Secondary | ICD-10-CM | POA: Diagnosis not present

## 2017-10-26 DIAGNOSIS — E785 Hyperlipidemia, unspecified: Secondary | ICD-10-CM | POA: Diagnosis not present

## 2017-10-26 DIAGNOSIS — M109 Gout, unspecified: Secondary | ICD-10-CM | POA: Diagnosis not present

## 2017-10-26 DIAGNOSIS — R69 Illness, unspecified: Secondary | ICD-10-CM | POA: Diagnosis not present

## 2017-10-26 DIAGNOSIS — Z809 Family history of malignant neoplasm, unspecified: Secondary | ICD-10-CM | POA: Diagnosis not present

## 2017-10-26 DIAGNOSIS — I10 Essential (primary) hypertension: Secondary | ICD-10-CM | POA: Diagnosis not present

## 2017-10-29 DIAGNOSIS — M109 Gout, unspecified: Secondary | ICD-10-CM | POA: Diagnosis not present

## 2017-10-29 DIAGNOSIS — N134 Hydroureter: Secondary | ICD-10-CM | POA: Diagnosis not present

## 2017-10-29 DIAGNOSIS — N179 Acute kidney failure, unspecified: Secondary | ICD-10-CM | POA: Diagnosis not present

## 2017-10-29 DIAGNOSIS — I129 Hypertensive chronic kidney disease with stage 1 through stage 4 chronic kidney disease, or unspecified chronic kidney disease: Secondary | ICD-10-CM | POA: Diagnosis not present

## 2017-10-29 DIAGNOSIS — N139 Obstructive and reflux uropathy, unspecified: Secondary | ICD-10-CM | POA: Diagnosis not present

## 2017-10-29 DIAGNOSIS — N183 Chronic kidney disease, stage 3 (moderate): Secondary | ICD-10-CM | POA: Diagnosis not present

## 2017-11-24 ENCOUNTER — Encounter: Payer: Self-pay | Admitting: Urology

## 2017-11-24 ENCOUNTER — Ambulatory Visit: Payer: Medicare HMO | Admitting: Urology

## 2017-11-24 VITALS — BP 131/83 | HR 75 | Ht 70.0 in

## 2017-11-24 DIAGNOSIS — R339 Retention of urine, unspecified: Secondary | ICD-10-CM

## 2017-11-24 MED ORDER — NITROFURANTOIN MONOHYD MACRO 100 MG PO CAPS: 100.0000 mg | ORAL_CAPSULE | Freq: Two times a day (BID) | ORAL | 0 refills | Status: DC

## 2017-11-24 MED ORDER — CEFTRIAXONE SODIUM 1 G IJ SOLR
1.0000 g | Freq: Once | INTRAMUSCULAR | Status: AC
  Administered 2017-11-24: 1 g via INTRAMUSCULAR

## 2017-11-24 MED ORDER — CIPROFLOXACIN HCL 500 MG PO TABS: 500.0000 mg | ORAL_TABLET | Freq: Two times a day (BID) | ORAL | 0 refills | Status: AC

## 2017-11-24 NOTE — Patient Instructions (Signed)
Indwelling Urinary Catheter Care, Adult  Take good care of your catheter to keep it working and to prevent problems.  How to wear your catheter  Attach your catheter to your leg with tape (adhesive tape) or a leg strap. Make sure it is not too tight. If you use tape, remove any bits of tape that are already on the catheter.  How to wear a drainage bag  You should have:   A large overnight bag.   A small leg bag.    Overnight Bag  You may wear the overnight bag at any time. Always keep the bag below the level of your bladder but off the floor. When you sleep, put a clean plastic bag in a wastebasket. Then hang the bag inside the wastebasket.  Leg Bag  Never wear the leg bag at night. Always wear the leg bag below your knee. Keep the leg bag secure with a leg strap or tape.  How to care for your skin   Clean the skin around the catheter at least once every day.   Shower every day. Do not take baths.   Put creams, lotions, or ointments on your genital area only as told by your doctor.   Do not use powders, sprays, or lotions on your genital area.  How to clean your catheter and your skin  1. Wash your hands with soap and water.  2. Wet a washcloth in warm water and gentle (mild) soap.  3. Use the washcloth to clean the skin where the catheter enters your body. Clean downward and wipe away from the catheter in small circles. Do not wipe toward the catheter.  4. Pat the area dry with a clean towel. Make sure to clean off all soap.  How to care for your drainage bags  Empty your drainage bag when it is ?- full or at least 2-3 times a day. Replace your drainage bag once a month or sooner if it starts to smell bad or look dirty. Do not clean your drainage bag unless told by your doctor.  Emptying a drainage bag    Supplies Needed   Rubbing alcohol.   Gauze pad or cotton ball.   Tape or a leg strap.    Steps  1. Wash your hands with soap and water.  2. Separate (detach) the bag from your leg.  3. Hold the bag over  the toilet or a clean container. Keep the bag below your hips and bladder. This stops pee (urine) from going back into the tube.  4. Open the pour spout at the bottom of the bag.  5. Empty the pee into the toilet or container. Do not let the pour spout touch any surface.  6. Put rubbing alcohol on a gauze pad or cotton ball.  7. Use the gauze pad or cotton ball to clean the pour spout.  8. Close the pour spout.  9. Attach the bag to your leg with tape or a leg strap.  10. Wash your hands.    Changing a drainage bag  Supplies Needed   Alcohol wipes.   A clean drainage bag.   Adhesive tape or a leg strap.    Steps  1. Wash your hands with soap and water.  2. Separate the dirty bag from your leg.  3. Pinch the rubber catheter with your fingers so that pee does not spill out.  4. Separate the catheter tube from the drainage tube where these tubes connect (at the   connection valve). Do not let the tubes touch any surface.  5. Clean the end of the catheter tube with an alcohol wipe. Use a different alcohol wipe to clean the end of the drainage tube.  6. Connect the catheter tube to the drainage tube of the clean bag.  7. Attach the new bag to the leg with adhesive tape or a leg strap.  8. Wash your hands.    How to prevent infection and other problems   Never pull on your catheter or try to remove it. Pulling can damage tissue in your body.   Always wash your hands before and after touching your catheter.   If a leg strap gets wet, replace it with a dry one.   Drink enough fluids to keep your pee clear or pale yellow, or as told by your doctor.   Do not let the drainage bag or tubing touch the floor.   Wear cotton underwear.   If you are male, wipe from front to back after you poop (have a bowel movement).   Check on the catheter often to make sure it works and the tubing is not twisted.  Get help if:   Your pee is cloudy.   Your pee smells unusually bad.   Your pee is not draining into the bag.   Your  tube gets clogged.   Your catheter starts to leak.   Your bladder feels full.  Get help right away if:   You have redness, swelling, or pain where the catheter enters your body.   You have fluid, pus, or a bad smell coming from the area where the catheter enters your body.   The area where the catheter enters your body feels warm.   You have a fever.   You have pain in your:  ? Stomach (abdomen).  ? Legs.  ? Lower back.  ? Bladder.   You see blood fill the catheter.   Your pee is pink or red.   You feel sick to your stomach (nauseous).   You throw up (vomit).   You have chills.   Your catheter gets pulled out.  This information is not intended to replace advice given to you by your health care provider. Make sure you discuss any questions you have with your health care provider.  Document Released: 07/19/2012 Document Revised: 02/20/2016 Document Reviewed: 09/06/2013  Elsevier Interactive Patient Education  2018 [employer]

## 2017-11-24 NOTE — Progress Notes (Signed)
11/24/2017 5:15 PM   Troy Berry October 23, 1945 [id number]  Referring provider: Lavonia Dana, MD [address] [place],  [address]  CC: Urinary retention  HPI: I had the pleasure of seeing [name] in urology clinic today for urinary retention.  Briefly, he is a 72 year old fragile appearing African-American male who was initially admitted to the hospital in April 2019 with acute urinary retention and AKI.  Creatinine peak was 3.1 at that time.  Foley was placed for decompression, however he failed a void trial as an outpatient.  Dr. Pilar Berry had recommended urodynamics, however the patient did not follow-up.  Unfortunately, he and his wife had to have low healthcare literacy and are unable to provide much history.  He thinks his father may have had prostate cancer.  Additionally, he reportedly had a PSA value of 10 in 2010, however this was never further worked up as he was lost to follow-up.  He has been on doxazosin since April 2019.  Transportation to and from appointments is a major challenge for them.  His current catheter has been in place approximately 10 weeks.  He denies fever, chills, hematuria, flank pain.  PMH: Past Medical History:  Diagnosis Date  . Anemia   . CKD (chronic kidney disease), stage III ([place])   . Hypertension     Surgical History: History reviewed. No pertinent surgical history.   Allergies:  Allergies  Allergen Reactions  . Ace Inhibitors     Other reaction(s): Other (See Comments) lip swelling    Family History: Family History  Problem Relation Age of Onset  . Cancer Father     Social History:  reports that he quit smoking about 19 years ago. His smoking use included cigarettes. He has never used smokeless tobacco. He reports that he drank alcohol. He reports that he does not use drugs.  ROS: Please see flowsheet from today's date for complete review of systems.  Physical Exam: BP 131/83 (BP Location:  Left Arm, Patient Position: Sitting, Cuff Size: Normal)   Pulse 75   Ht 5\' 10"  (1.778 m)   BMI 24.25 kg/m    Constitutional: Chronically ill-appearing, fragile Cardiovascular: No clubbing, cyanosis, or edema. Respiratory: Normal respiratory effort, no increased work of breathing. GI: Abdomen is soft, nontender, nondistended, no abdominal masses Foley catheter with yellow urine Lymph: No cervical or inguinal lymphadenopathy. Skin: No rashes, bruises or suspicious lesions. Neurologic: Delayed, slow to respond Psychiatric: Normal mood and affect.  Laboratory Data: None to review  Pertinent Imaging:  I have personally reviewed the CT abdomen pelvis 07/2017.  Notable for distended bladder and bilateral hydronephrosis, prostate measures 123 g.  Assessment & Plan:   In summary, [name] is a fragile appearing 72 year old African-American male with poor access to medical care and poor health literacy.  He is here to discuss his Foley dependent urinary retention.  It is clear that he would be unable to perform clean intermittent catheterization, and his wife adamantly refuses to learn this.  Possible etiologies include include BPH, advanced prostate cancer, and detrusor failure.  Could consider a PSA value, however will likely be significantly elevated with his chronic Foley catheter and prostatomegaly.  Prior to offering any surgical intervention for bladder outlet obstruction, we need to obtain urodynamics to see if he has a functioning bladder.  I explained this in detail to him and his wife.  I have worked with our nursing team to set him up with social work so they can  obtain a ride to [place] and obtain urodynamics.  I also provided him with a prescription for Cipro and Macrobid to start 3 days prior to urodynamics to sterilize the urine and minimize risk of urosepsis.  Finally, prior to undergoing any surgical therapy we will plan to obtain a PSA and perform DRE.   Plan: 1. Urodynamics,  abx prior to sterilize urine 2. PSA/DRE, BMP, renal ultrasound, and foley change in 4 weeks 3. Consider HOLEP if functional detrusor on urodynamics 4. Would need PCP clearance an optimization prior to any surgical therapy   Troy Co, MD  [facility] [address] [place], [place] [address] [phone]

## 2017-11-24 NOTE — Progress Notes (Signed)
Cath Change/ Replacement  Patient is present today for a catheter change due to urinary retention.  76ml of water was removed from the balloon, a 16 coude foley cath was removed with out difficulty.  Patient was cleaned and prepped in a sterile fashion with betadine and 2% lidocaine jelly was instilled into the urethra. A 18 coude foley cath was replaced into the bladder no complications were noted Urine return was noted 21ml and urine was yellow in color. The balloon was filled with 78ml of sterile water. A leg bag was attached for drainage.  A night bag was also given to the patient and patient was given instruction on how to change from one bag to another. Patient was given proper instruction on catheter care.    Preformed by: Elberta Leatherwood, CMA  Follow up: 4 weeks

## 2017-11-25 LAB — CBC WITH DIFFERENTIAL
Basophils Absolute: 0 10*3/uL (ref 0.0–0.2)
Basos: 0 %
EOS (ABSOLUTE): 0.2 10*3/uL (ref 0.0–0.4)
EOS: 4 %
HEMOGLOBIN: 8.3 g/dL — AB (ref 13.0–17.7)
Hematocrit: 26.3 % — ABNORMAL LOW (ref 37.5–51.0)
IMMATURE GRANS (ABS): 0 10*3/uL (ref 0.0–0.1)
Immature Granulocytes: 0 %
LYMPHS: 16 %
Lymphocytes Absolute: 0.9 10*3/uL (ref 0.7–3.1)
MCH: 25.2 pg — ABNORMAL LOW (ref 26.6–33.0)
MCHC: 31.6 g/dL (ref 31.5–35.7)
MCV: 80 fL (ref 79–97)
MONOCYTES: 13 %
Monocytes Absolute: 0.7 10*3/uL (ref 0.1–0.9)
Neutrophils Absolute: 3.7 10*3/uL (ref 1.4–7.0)
Neutrophils: 67 %
RBC: 3.29 x10E6/uL — AB (ref 4.14–5.80)
RDW: 16.4 % — ABNORMAL HIGH (ref 12.3–15.4)
WBC: 5.5 10*3/uL (ref 3.4–10.8)

## 2017-11-25 LAB — COMPREHENSIVE METABOLIC PANEL
ALT: 10 IU/L (ref 0–44)
AST: 9 IU/L (ref 0–40)
Albumin/Globulin Ratio: 1 — ABNORMAL LOW (ref 1.2–2.2)
Albumin: 3.4 g/dL — ABNORMAL LOW (ref 3.5–4.8)
Alkaline Phosphatase: 43 IU/L (ref 39–117)
BUN/Creatinine Ratio: 11 (ref 10–24)
BUN: 24 mg/dL (ref 8–27)
Bilirubin Total: 0.2 mg/dL (ref 0.0–1.2)
CALCIUM: 9.4 mg/dL (ref 8.6–10.2)
CO2: 18 mmol/L — AB (ref 20–29)
CREATININE: 2.17 mg/dL — AB (ref 0.76–1.27)
Chloride: 106 mmol/L (ref 96–106)
GFR, EST AFRICAN AMERICAN: 34 mL/min/{1.73_m2} — AB (ref >59)
GFR, EST NON AFRICAN AMERICAN: 29 mL/min/{1.73_m2} — AB (ref >59)
Globulin, Total: 3.4 g/dL (ref 1.5–4.5)
Glucose: 106 mg/dL — ABNORMAL HIGH (ref 65–99)
Potassium: 3.9 mmol/L (ref 3.5–5.2)
Sodium: 141 mmol/L (ref 134–144)
TOTAL PROTEIN: 6.8 g/dL (ref 6.0–8.5)

## 2017-11-30 ENCOUNTER — Ambulatory Visit: Payer: Medicare HMO | Admitting: Urology

## 2017-12-08 DIAGNOSIS — N179 Acute kidney failure, unspecified: Secondary | ICD-10-CM | POA: Diagnosis not present

## 2017-12-08 DIAGNOSIS — N183 Chronic kidney disease, stage 3 (moderate): Secondary | ICD-10-CM | POA: Diagnosis not present

## 2017-12-08 DIAGNOSIS — I129 Hypertensive chronic kidney disease with stage 1 through stage 4 chronic kidney disease, or unspecified chronic kidney disease: Secondary | ICD-10-CM | POA: Diagnosis not present

## 2017-12-08 DIAGNOSIS — D631 Anemia in chronic kidney disease: Secondary | ICD-10-CM | POA: Diagnosis not present

## 2017-12-08 DIAGNOSIS — M109 Gout, unspecified: Secondary | ICD-10-CM | POA: Diagnosis not present

## 2017-12-21 ENCOUNTER — Telehealth: Payer: Self-pay | Admitting: Family Medicine

## 2017-12-21 ENCOUNTER — Ambulatory Visit
Admission: RE | Admit: 2017-12-21 | Discharge: 2017-12-21 | Disposition: A | Discharge status: Home / Self Care | Payer: Medicare HMO | Source: Ambulatory Visit | Attending: Urology | Admitting: Urology

## 2017-12-21 DIAGNOSIS — N200 Calculus of kidney: Secondary | ICD-10-CM | POA: Insufficient documentation

## 2017-12-21 DIAGNOSIS — R339 Retention of urine, unspecified: Secondary | ICD-10-CM | POA: Insufficient documentation

## 2017-12-21 DIAGNOSIS — R93422 Abnormal radiologic findings on diagnostic imaging of left kidney: Secondary | ICD-10-CM | POA: Diagnosis not present

## 2017-12-21 DIAGNOSIS — N281 Cyst of kidney, acquired: Secondary | ICD-10-CM | POA: Diagnosis not present

## 2017-12-21 DIAGNOSIS — N132 Hydronephrosis with renal and ureteral calculous obstruction: Secondary | ICD-10-CM | POA: Diagnosis not present

## 2017-12-21 NOTE — Telephone Encounter (Signed)
LMOM for patient to return call.

## 2017-12-21 NOTE — Telephone Encounter (Signed)
-----   Message from [name], MD sent at 12/21/2017  2:04 PM EDT ----- Regarding: normal renal ultrasound Please let patient or wife know that his renal ultrasound looked good. The kidneys are draining well with the catheter in place.  [name], MD 12/21/2017

## 2017-12-22 ENCOUNTER — Telehealth: Payer: Self-pay | Admitting: Urology

## 2017-12-22 ENCOUNTER — Ambulatory Visit: Payer: Medicare HMO

## 2017-12-22 ENCOUNTER — Encounter: Payer: Self-pay | Admitting: Urology

## 2017-12-22 DIAGNOSIS — R338 Other retention of urine: Secondary | ICD-10-CM | POA: Diagnosis not present

## 2017-12-22 DIAGNOSIS — R3914 Feeling of incomplete bladder emptying: Secondary | ICD-10-CM | POA: Diagnosis not present

## 2017-12-22 NOTE — Telephone Encounter (Signed)
App moved Left message to cb to confirm new app time  MB

## 2017-12-22 NOTE — Telephone Encounter (Signed)
-----   Message from [name], MD sent at 12/21/2017  2:14 PM EDT ----- Regarding: UDS discussion Please cancel his visit with [facility] 9/23 to discuss UDS results, and schedule with me 9/23 instead. I discussed with Dr. McDiarmid.  Please put in a 30 or 45 minute spot if possible, complex patient.  Thanks, [name], MD 12/21/2017

## 2017-12-23 NOTE — Telephone Encounter (Signed)
Patients wife notified

## 2017-12-28 ENCOUNTER — Ambulatory Visit: Payer: Medicare HMO | Admitting: Urology

## 2018-01-20 ENCOUNTER — Ambulatory Visit: Payer: Medicare HMO | Admitting: Family Medicine

## 2018-01-22 DIAGNOSIS — R3914 Feeling of incomplete bladder emptying: Secondary | ICD-10-CM | POA: Diagnosis not present

## 2018-01-22 DIAGNOSIS — R338 Other retention of urine: Secondary | ICD-10-CM | POA: Diagnosis not present

## 2018-01-25 ENCOUNTER — Other Ambulatory Visit: Payer: Self-pay | Admitting: Urology

## 2018-01-29 ENCOUNTER — Ambulatory Visit (INDEPENDENT_AMBULATORY_CARE_PROVIDER_SITE_OTHER): Payer: Medicare HMO | Admitting: Urology

## 2018-01-29 ENCOUNTER — Encounter: Payer: Self-pay | Admitting: Urology

## 2018-01-29 VITALS — BP 140/86 | HR 80 | Ht 70.0 in

## 2018-01-29 DIAGNOSIS — N281 Cyst of kidney, acquired: Secondary | ICD-10-CM | POA: Diagnosis not present

## 2018-01-29 DIAGNOSIS — R339 Retention of urine, unspecified: Secondary | ICD-10-CM | POA: Diagnosis not present

## 2018-01-29 DIAGNOSIS — N138 Other obstructive and reflux uropathy: Secondary | ICD-10-CM

## 2018-01-29 DIAGNOSIS — N401 Enlarged prostate with lower urinary tract symptoms: Secondary | ICD-10-CM | POA: Diagnosis not present

## 2018-01-29 DIAGNOSIS — N200 Calculus of kidney: Secondary | ICD-10-CM

## 2018-01-29 MED ORDER — FINASTERIDE 5 MG PO TABS: 5.0000 mg | ORAL_TABLET | Freq: Every day | ORAL | 11 refills | Status: DC

## 2018-01-29 NOTE — Progress Notes (Signed)
01/29/2018 10:14 AM   Troy Berry 06/04/1945 [id number]  Referring provider: Center, [facility] [place], [place] [address]  Chief Complaint  Patient presents with  . Results    UDS    HPI: 71 year old male with a history of urinary retention, acute kidney injury secondary to urinary obstruction who returns today following urodynamics.  Dynamics performed on 01/22/2018 show a maximal capacity of 310 cc.  The first sensation was 242 mL's.  He did have bladder instability and an unsuppressed contraction leading to voiding.  He was ultimately able to generate a voluntary contraction with an obstructed type of flow pattern.  His voided volume was only 62 mL's with a max flow of 6 mL's per second.  His detrusor at peak flow was only 23 cm of water.  His max detrusor pressure was 33 cm of water.  PVR was 249 cc.  He did have intermittent EMG activity during voiding.  Fluoroscopy showed a mildly trabeculated bladder with bladder neck elevation without reflux.  Urogram indicates that he is unobstructed.  Initially admitted in 07/2017 with urinary retention with a 0.1 L in his bladder with associated moderate hydroureteronephrosis and elevated creatinine to 3 from his baseline of 1.5.  He was previously on doxazosin and this was continued.  Prostate estimated volume 123 cc based on CT scan.  Please today, he is a very poor historian and minimally conversive.  He does report that he is somewhat "aggravated" by his Foley catheter.  He is not ambulatory and does no ADLs.  PMH: Past Medical History:  Diagnosis Date  . Anemia   . CKD (chronic kidney disease), stage III ([place])   . Hypertension     Surgical History: No past surgical history on file.  Home Medications:  Allergies as of 01/29/2018      Reactions   Ace Inhibitors    Other reaction(s): Other (See Comments) lip swelling      Medication List        Accurate as of 01/29/18 10:14 AM.  Always use your most recent med list.          allopurinol 300 MG tablet Commonly known as:  ZYLOPRIM Take 300 mg by mouth daily.   amLODipine 10 MG tablet Commonly known as:  NORVASC Take 10 mg by mouth daily.   carvedilol 25 MG tablet Commonly known as:  COREG Take 25 mg by mouth 2 (two) times daily with a meal.   doxazosin 2 MG tablet Commonly known as:  CARDURA Take 1 tablet by mouth daily.   finasteride 5 MG tablet Commonly known as:  PROSCAR Take 1 tablet (5 mg total) by mouth daily.   lovastatin 40 MG tablet Commonly known as:  MEVACOR Take 1 tablet by mouth every evening.       Allergies:  Allergies  Allergen Reactions  . Ace Inhibitors     Other reaction(s): Other (See Comments) lip swelling    Family History: Family History  Problem Relation Age of Onset  . Cancer Father     Social History:  reports that he quit smoking about 19 years ago. His smoking use included cigarettes. He has never used smokeless tobacco. He reports that he drank alcohol. He reports that he does not use drugs.  ROS: UROLOGY Frequent Urination?: No Hard to postpone urination?: No Burning/pain with urination?: No Get up at night to urinate?: No Leakage of urine?: No Urine stream starts and stops?: No Trouble starting  stream?: No Do you have to strain to urinate?: No Blood in urine?: No Urinary tract infection?: No Sexually transmitted disease?: No Injury to kidneys or bladder?: No Painful intercourse?: No Weak stream?: No Erection problems?: No Penile pain?: No  Gastrointestinal Nausea?: No Vomiting?: No Indigestion/heartburn?: No Diarrhea?: No Constipation?: No  Constitutional Fever: No Night sweats?: No Weight loss?: Yes Fatigue?: No  Skin Skin rash/lesions?: No Itching?: No  Eyes Blurred vision?: No Double vision?: No  Ears/Nose/Throat Sore throat?: No Sinus problems?: No  Hematologic/Lymphatic Swollen glands?: No Easy bruising?:  No  Cardiovascular Leg swelling?: Yes Chest pain?: No  Respiratory Cough?: No Shortness of breath?: Yes  Endocrine Excessive thirst?: No  Musculoskeletal Back pain?: No Joint pain?: Yes  Neurological Headaches?: Yes Dizziness?: No  Psychologic Depression?: Yes Anxiety?: No  Physical Exam: BP 140/86   Pulse 80   Ht 5\' 10"  (1.778 m)   BMI 24.25 kg/m   Constitutional:  Alert and oriented, No acute distress.  Accompanied by wife.  In wheelchair.  Minimally conversive. HEENT: [place] AT, moist mucus membranes.  Trachea midline, no masses. Cardiovascular: No clubbing, cyanosis, or edema. Respiratory: Normal respiratory effort, no increased work of breathing. GI: Abdomen is soft, nontender, nondistended, no abdominal masses GU: Foley catheter in place. Rectal: Slightly decreased sphincter tone.  Massive prostate, 50+ cc, no obvious nodularity appreciated. Neurologic: Grossly intact, no focal deficits, moving all 4 extremities. Psychiatric: Normal mood and affect.  Laboratory Data: Lab Results  Component Value Date   WBC 5.5 11/24/2017   HGB 8.3 (L) 11/24/2017   HCT 26.3 (L) 11/24/2017   MCV 80 11/24/2017   PLT 167 07/31/2017    Lab Results  Component Value Date   CREATININE 2.17 (H) 11/24/2017    Urinalysis    Component Value Date/Time   COLORURINE AMBER (A) 09/27/2017 1517   APPEARANCEUR CLOUDY (A) 09/27/2017 1517   APPEARANCEUR Hazy 09/26/2011 1609   LABSPEC 1.015 09/27/2017 1517   LABSPEC 1.010 09/26/2011 1609   PHURINE 7.0 09/27/2017 1517   GLUCOSEU NEGATIVE 09/27/2017 1517   GLUCOSEU 150 mg/dL 09/26/2011 1609   HGBUR SMALL (A) 09/27/2017 1517   BILIRUBINUR NEGATIVE 09/27/2017 1517   BILIRUBINUR Negative 09/26/2011 1609   KETONESUR NEGATIVE 09/27/2017 1517   PROTEINUR 100 (A) 09/27/2017 1517   NITRITE NEGATIVE 09/27/2017 1517   LEUKOCYTESUR SMALL (A) 09/27/2017 1517   LEUKOCYTESUR Negative 09/26/2011 1609    Lab Results  Component Value Date    BACTERIA MANY (A) 09/27/2017    Pertinent Imaging: Results for orders placed during the hospital encounter of 12/21/17  Ultrasound renal complete   Narrative CLINICAL DATA:  72 year old male with urinary retention. Evaluate for resolution of hydronephrosis. Subsequent encounter.  EXAM: RENAL / URINARY TRACT ULTRASOUND COMPLETE  COMPARISON:  None.  FINDINGS: Right Kidney:  Length: 10.1 cm. Echogenicity within normal limits. No hydronephrosis. Mid to upper pole 7.3 mm nonobstructing stone. Lower pole 1.1 x 1.4 x 1.1 cm cyst.  Left Kidney:  Length: 9.9 cm. Echogenicity within normal limits. Partially duplicated left renal collecting system without hydronephrosis. 3.2 x 2.8 x 2.9 cm cyst mid aspect. Lower pole 1.6 x 1.6 x 1.6 cm minimally septated cyst. Lower pole 1 cm cyst.  Bladder:  Decompressed by Foley catheter.  IMPRESSION: 1. Resolution of previously noted hydronephrosis. Partially duplicated left renal collecting system. 2. Left renal cysts largest measuring up to 3.3 cm. 1.6 cm left lower pole cyst with thin septation. 3. Right upper pole 7.3 mm nonobstructing stone and 1.1  cm lower pole cyst. 4. Foley catheter in place with decompressed urinary bladder.   Electronically Signed   By: Genia Del M.D.   On: 12/21/2017 13:31    Renal ultrasound personally reviewed.  Agree with radiologic interpretation.  Assessment & Plan:    1. Urinary retention S/p multiple failed voiding trials Urodynamics was reviewed personally today, he was able to void with notably and obstructive voiding pattern, however, his detrusor pressure at max peak flow was relatively low only 23 cm of water.  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 Benign prostatic hyperplasia with urinary obstruction As above He has not yet had a PSA, will likely be elevated in the setting of prostamegaly and chronic indwelling Foley, however, would like to pursue this to ensure that is not markedly elevated He will have labs this afternoon Rectal exam reassuring, and no nodularity shaded - PSA; Future  3. Kidney stones No kidney stone on renal ultrasound Exline asymptomatic We will follow conservatively given his overall status and medical comorbidities  4. 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  Hollice Espy, MD  [facility] [address] [place], [place] [address] [phone]  I spent 40 min with this patient of which greater than 50% was spent in counseling and coordination of care with the patient.

## 2018-02-04 DIAGNOSIS — N183 Chronic kidney disease, stage 3 (moderate): Secondary | ICD-10-CM | POA: Diagnosis not present

## 2018-02-04 DIAGNOSIS — I10 Essential (primary) hypertension: Secondary | ICD-10-CM | POA: Diagnosis not present

## 2018-02-04 DIAGNOSIS — E785 Hyperlipidemia, unspecified: Secondary | ICD-10-CM | POA: Diagnosis not present

## 2018-02-26 ENCOUNTER — Other Ambulatory Visit
Admission: RE | Admit: 2018-02-26 | Discharge: 2018-02-26 | Disposition: A | Discharge status: Home / Self Care | Payer: Medicare HMO | Source: Ambulatory Visit | Attending: Urology | Admitting: Urology

## 2018-02-26 ENCOUNTER — Encounter: Payer: Self-pay | Admitting: Urology

## 2018-02-26 ENCOUNTER — Other Ambulatory Visit: Payer: Self-pay

## 2018-02-26 ENCOUNTER — Ambulatory Visit (INDEPENDENT_AMBULATORY_CARE_PROVIDER_SITE_OTHER): Payer: Medicare HMO | Admitting: Urology

## 2018-02-26 VITALS — BP 125/74 | HR 76 | Wt 150.0 lb

## 2018-02-26 DIAGNOSIS — N138 Other obstructive and reflux uropathy: Secondary | ICD-10-CM | POA: Diagnosis not present

## 2018-02-26 DIAGNOSIS — R339 Retention of urine, unspecified: Secondary | ICD-10-CM

## 2018-02-26 DIAGNOSIS — N401 Enlarged prostate with lower urinary tract symptoms: Secondary | ICD-10-CM | POA: Insufficient documentation

## 2018-02-26 LAB — PSA: Prostatic Specific Antigen: 16.38 ng/mL — ABNORMAL HIGH (ref 0.00–4.00)

## 2018-02-26 NOTE — Progress Notes (Signed)
Simple Catheter Placement  Due to urinary retention patient is present today for a foley cath placement.  Patient was cleaned and prepped in a sterile fashion with betadine and lidocaine jelly 2% was instilled into the urethra.  A 16 coude FR foley catheter was inserted, urine return was noted  223ml, urine was clear yellow in color.  The balloon was filled with 10cc of sterile water.  A leg bag was attached for drainage. Patient was also given a night bag to take home and was given instruction on how to change from one bag to another.  Patient was given instruction on proper catheter care.  Patient tolerated well, no complications were noted   Preformed by: Fonnie Jarvis, CMA  Additional notes/ Follow up: 1 month cath exchange

## 2018-02-26 NOTE — Progress Notes (Signed)
Fill and Pull Catheter Removal  Patient is present today for a catheter removal.  Patient was cleaned and prepped in a sterile fashion 19ml of sterile water/ saline was instilled into the bladder when the patient felt the urge to urinate. 66ml of water was then drained from the balloon.  The foley cath was removed from the bladder no complications were noted .  Patient as then given some time to void on their own.  Patient cannot void their own after some time.  Patient tolerated well.  Preformed by: Elizabeth Palau, CMA(AAMA)

## 2018-02-26 NOTE — Patient Instructions (Signed)
Suprapubic Catheter Home Guide A suprapubic catheter is a rubber tube used to drain urine from the bladder into a collection bag. The catheter is inserted into the bladder through a small opening in the in the lower abdomen, near the center of the body, above the pubic bone (suprapubic area). There is a tiny balloon filled with germ-free (sterile) water on the end of the catheter that is in the bladder. The balloon helps to keep the catheter in place. Your suprapubic catheter may need to be replaced every 4-6 weeks, or as often as recommended by your health care provider. The collection bag must be emptied every day and cleaned every 2-3 days. The collection bag can be put beside your bed at night and attached to your leg during the day. You may have a large collection bag to use at night and a smaller one to use during the day. What are the risks?  Urine flow can become blocked. This can happen if the catheter is not working correctly, or if you have a blood clot in your bladder or in the catheter.  Tissue near the catheter may can become irritated and bleed.  Bacteria may get into your bladder and cause a urinary tract infection. How do I change the catheter? Supplies needed  Two pairs of sterile gloves.  Catheter.  Two syringes.  Sterile water.  Sterile cleaning solution.  Lubricant.  Collection bags. Changing the catheter To replace your catheter, take the following steps: 1. Drink plenty of fluids during the hours before you plan to change the catheter. 2. Wash your hands with soap and water. If soap and water are not available, use hand sanitizer. 3. Lie on your back and put on sterile gloves. 4. Clean the skin around the catheter opening using the sterile cleaning solution. 5. Remove the water from the balloon using a syringe. 6. Slowly remove the catheter. ? Do not pull on the catheter if it seems stuck. ? Call your health care provider immediately if you have difficulty  removing the catheter. 7. Take off the used gloves, and put on a new pair. 8. Put lubricant on the end of the new catheter that will go into your bladder. 9. Gently slide the catheter through the opening in your abdomen and into your bladder. 10. Wait for some urine to start flowing through the catheter. When urine starts to flow through the catheter, use a new syringe to fill the balloon with sterile water. 11. Attach the collection bag to the end of the catheter. Make sure the connection is tight. 12. Remove the gloves and wash your hands with soap and water.  How do I care for my skin around the catheter? Use a clean washcloth and soapy water to clean the skin around your catheter every day. Pat the area dry with a clean towel.  Do not pull on the catheter.  Do not use ointment or lotion on this area unless told by your health care provider.  Check your skin around the catheter every day for signs of infection. Check for: ? Redness, swelling, or pain. ? Fluid or blood. ? Warmth. ? Pus or a bad smell.  How do I clean and empty the collection bag? Clean the collection bag every 2-3 days, or as often as told by your health care provider. To do this, take the following steps:  Wash your hands with soap and water. If soap and water are not available, use hand sanitizer.  Disconnect the bag   from the catheter and immediately attach a new bag to the catheter.  Empty the used bag completely.  Clean the used bag using one of the following methods: ? Rinse the used bag with warm water and soap. ? Fill the bag with water and add 1 tsp of vinegar. Let it sit for about 30 minutes, then empty the bag.  Let the bag dry completely, and put it in a clean plastic bag before storing it.  Empty the large collection bag every 8 hours. Empty the small collection bag when it is about ? full. To empty your large or small collection bag, take the following steps:  Always keep the bag below the level  of the catheter. This keeps urine from flowing backwards into the catheter.  Hold the bag over the toilet or another container. Turn the valve (spigot) at the bottom of the bag to empty the urine. ? Do not touch the opening of the spigot. ? Do not let the opening touch the toilet or container.  Close the spigot tightly when the bag is empty.  What are some general tips?  Always wash your hands before and after caring for your catheter and collection bag. Use a mild, fragrance-free soap. If soap and water are not available, use hand sanitizer.  Clean the catheter with soap and water as often as told by your health care provider.  Always make sure there are no twists or curls (kinks) in the catheter tube.  Always make sure there are no leaks in the catheter or collection bag.  Drink enough fluid to keep your urine clear or pale yellow.  Do not take baths, swim, or use a hot tub. When should I seek medical care? Seek medical care if:  You leak urine.  You have redness, swelling, or pain around your catheter opening.  You have fluid or blood coming from your catheter opening.  Your catheter opening feels warm to the touch.  You have pus or a bad smell coming from your catheter opening.  You have a fever or chills.  Your urine flow slows down.  Your urine becomes cloudy or smelly.  When should I seek immediate medical care? Seek immediate medical care if your catheter comes out, or if you have:  Nausea.  Back pain.  Difficulty changing your catheter.  Blood in your urine.  No urine flow for 1 hour.  This information is not intended to replace advice given to you by your health care provider. Make sure you discuss any questions you have with your health care provider. Document Released: 12/10/2010 Document Revised: 11/21/2015 Document Reviewed: 12/05/2014 Elsevier Interactive Patient Education  2018 [name].

## 2018-02-26 NOTE — Progress Notes (Signed)
02/26/2018 5:56 PM   Troy Berry 31-Oct-1945 [id number]  Referring provider: Center, [facility] [place], [place] [address]  Chief Complaint  Patient presents with  . Urinary Retention    4 wk    HPI: 72 year old male with chronic urinary retention who presents today for repeat voiding trial.  As per last visit note, he underwent urodynamics which showed relatively significant detrusor hypofunction as well as sequela from chronic outlet obstruction including elevated bladder neck and trabeculation.  He also had some instability.  He initially presented with massive urinary retention, 1 L in the bladder and acute on chronic renal insufficiency secondary to this, hydroureteronephrosis bilaterally.  He is yet to have a PSA in the recent past although being instructed to go by the lab on several occasions.  He did go have this drawn prior to being seen today.  He does have a personal history of elevated PSA with PSA of approximately 10.4 in 2012 without further evaluation.  He is failed multiple voiding trials.  He would not be able to tolerate/ perform CIC per his wife.  The patient has fairly poor medical literacy and poor functional status.    PMH: Past Medical History:  Diagnosis Date  . Anemia   . CKD (chronic kidney disease), stage III ([place])   . Hypertension     Surgical History: No past surgical history on file.  Home Medications:  Allergies as of 02/26/2018      Reactions   Ace Inhibitors    Other reaction(s): Other (See Comments) lip swelling      Medication List        Accurate as of 02/26/18  5:56 PM. Always use your most recent med list.          allopurinol 300 MG tablet Commonly known as:  ZYLOPRIM Take 300 mg by mouth daily.   amLODipine 10 MG tablet Commonly known as:  NORVASC Take 10 mg by mouth daily.   carvedilol 25 MG tablet Commonly known as:  COREG Take 25 mg by mouth 2 (two) times daily with a  meal.   doxazosin 2 MG tablet Commonly known as:  CARDURA Take 1 tablet by mouth daily.   finasteride 5 MG tablet Commonly known as:  PROSCAR Take 1 tablet (5 mg total) by mouth daily.   lovastatin 40 MG tablet Commonly known as:  MEVACOR Take 1 tablet by mouth every evening.       Allergies:  Allergies  Allergen Reactions  . Ace Inhibitors     Other reaction(s): Other (See Comments) lip swelling    Family History: Family History  Problem Relation Age of Onset  . Cancer Father     Social History:  reports that he quit smoking about 19 years ago. His smoking use included cigarettes. He has never used smokeless tobacco. He reports that he drank alcohol. He reports that he does not use drugs.  ROS: UROLOGY Frequent Urination?: No Hard to postpone urination?: No Burning/pain with urination?: No Get up at night to urinate?: No Leakage of urine?: No Urine stream starts and stops?: No Trouble starting stream?: No Do you have to strain to urinate?: No Blood in urine?: No Urinary tract infection?: No Sexually transmitted disease?: No Injury to kidneys or bladder?: No Painful intercourse?: No Weak stream?: No Erection problems?: No Penile pain?: No  Gastrointestinal Nausea?: No Vomiting?: No Indigestion/heartburn?: No Diarrhea?: No Constipation?: No  Constitutional Fever: No Night sweats?: No Weight loss?:  No Fatigue?: No  Skin Skin rash/lesions?: No Itching?: No  Eyes Blurred vision?: No Double vision?: No  Ears/Nose/Throat Sore throat?: No Sinus problems?: No  Hematologic/Lymphatic Swollen glands?: No Easy bruising?: No  Cardiovascular Leg swelling?: Yes Chest pain?: No  Respiratory Cough?: No Shortness of breath?: Yes  Endocrine Excessive thirst?: No  Musculoskeletal Back pain?: No Joint pain?: No  Neurological Headaches?: Yes Dizziness?: No  Psychologic Depression?: Yes Anxiety?: No  Physical Exam: BP 125/74   Pulse  76   Wt 150 lb (68 kg)   BMI 21.52 kg/m   Constitutional:  Alert and oriented, No acute distress.  Minimally conversive. HEENT: [place] AT, moist mucus membranes.  Trachea midline, no masses. Cardiovascular: No clubbing, cyanosis, or edema. Respiratory: Normal respiratory effort, no increased work of breathing. Skin: No rashes, bruises or suspicious lesions. Neurologic: Grossly intact, no focal deficits, moving all 4 extremities. Psychiatric: Normal mood and affect.  Laboratory Data: Lab Results  Component Value Date   WBC 5.5 11/24/2017   HGB 8.3 (L) 11/24/2017   HCT 26.3 (L) 11/24/2017   MCV 80 11/24/2017   PLT 167 07/31/2017    Lab Results  Component Value Date   CREATININE 2.17 (H) 11/24/2017    Assessment & Plan:    1. Urinary retention Failed voiding trial again today, catheter placed As per previous discussion, given his relatively low peak voiding pressure of 23 cm of water, I am suspicious that he will not benefit from surgical intervention Surgical intervention was discussed and declined At this point, chronic indwelling catheter is the best management He would not be able to CIC We did discuss the placement of SP tube today at length which has benefits which were outlined today in detail including the risks He was provided literature on suprapubic tube placement which the patient is wife will share with her daughter and consider, will let us know if he like to pursue this which would most likely be best performed in interventional radiology  We will plan for continued monthly cath changes, will reach out to home health to find out if this can be facilitated   2. Benign prostatic hyperplasia with urinary obstruction This point time, limited benefit to continue to finasteride/doxazosin thus will discontinue these medications    Hollice Espy, MD  [facility] [address], [place], [place] [address] [phone]

## 2018-03-01 ENCOUNTER — Telehealth: Payer: Self-pay

## 2018-03-01 NOTE — Telephone Encounter (Signed)
-----   Message from Hollice Espy, MD sent at 02/26/2018  5:58 PM EST ----- I failed to mention at this appointment today that he could stop his doxazosin and finasteride now that he has an indwelling Foley.  Please reach out to him and have him stop these medications.  I taken him off his list.  Hollice Espy, MD

## 2018-03-01 NOTE — Telephone Encounter (Signed)
Left message advising patient to call office to discuss medication changes per Dr Erlene Quan.

## 2018-03-01 NOTE — Telephone Encounter (Signed)
Advised patients wife to have him stop taking doxazosin and finasteride per Dr Erlene Quan.

## 2018-03-01 NOTE — Telephone Encounter (Signed)
Pt's wife called office returning call.  Please call her.

## 2018-03-09 DIAGNOSIS — N139 Obstructive and reflux uropathy, unspecified: Secondary | ICD-10-CM | POA: Diagnosis not present

## 2018-03-09 DIAGNOSIS — D631 Anemia in chronic kidney disease: Secondary | ICD-10-CM | POA: Diagnosis not present

## 2018-03-09 DIAGNOSIS — N183 Chronic kidney disease, stage 3 (moderate): Secondary | ICD-10-CM | POA: Diagnosis not present

## 2018-03-09 DIAGNOSIS — I129 Hypertensive chronic kidney disease with stage 1 through stage 4 chronic kidney disease, or unspecified chronic kidney disease: Secondary | ICD-10-CM | POA: Diagnosis not present

## 2018-03-09 DIAGNOSIS — M109 Gout, unspecified: Secondary | ICD-10-CM | POA: Diagnosis not present

## 2018-03-11 ENCOUNTER — Other Ambulatory Visit: Payer: Self-pay

## 2018-03-11 DIAGNOSIS — N401 Enlarged prostate with lower urinary tract symptoms: Secondary | ICD-10-CM

## 2018-03-11 DIAGNOSIS — N138 Other obstructive and reflux uropathy: Secondary | ICD-10-CM

## 2018-03-11 DIAGNOSIS — R339 Retention of urine, unspecified: Secondary | ICD-10-CM

## 2018-03-21 ENCOUNTER — Emergency Department
Admission: EM | Admit: 2018-03-21 | Discharge: 2018-03-21 | Disposition: A | Discharge status: Home / Self Care | Payer: Medicare HMO | Attending: Emergency Medicine | Admitting: Emergency Medicine

## 2018-03-21 ENCOUNTER — Encounter: Payer: Self-pay | Admitting: Emergency Medicine

## 2018-03-21 DIAGNOSIS — Z79899 Other long term (current) drug therapy: Secondary | ICD-10-CM | POA: Diagnosis not present

## 2018-03-21 DIAGNOSIS — Y69 Unspecified misadventure during surgical and medical care: Secondary | ICD-10-CM | POA: Diagnosis not present

## 2018-03-21 DIAGNOSIS — N183 Chronic kidney disease, stage 3 (moderate): Secondary | ICD-10-CM | POA: Insufficient documentation

## 2018-03-21 DIAGNOSIS — T839XXA Unspecified complication of genitourinary prosthetic device, implant and graft, initial encounter: Secondary | ICD-10-CM | POA: Diagnosis present

## 2018-03-21 DIAGNOSIS — Z87891 Personal history of nicotine dependence: Secondary | ICD-10-CM | POA: Diagnosis not present

## 2018-03-21 DIAGNOSIS — I129 Hypertensive chronic kidney disease with stage 1 through stage 4 chronic kidney disease, or unspecified chronic kidney disease: Secondary | ICD-10-CM | POA: Insufficient documentation

## 2018-03-21 DIAGNOSIS — T83098A Other mechanical complication of other indwelling urethral catheter, initial encounter: Secondary | ICD-10-CM | POA: Diagnosis not present

## 2018-03-21 NOTE — ED Provider Notes (Signed)
[facility] Emergency Department Provider Note   ____________________________________________    I have reviewed the triage vital signs and the nursing notes.   HISTORY  Chief Complaint Urinary Catheter Problem     HPI Troy Berry. is a 72 y.o. male with an indwelling Foley catheter who presents today because he was walking and tripped on the catheter and apparently the tube disconnected from the port.  The catheter did not come out of his penis.  His wife reconnected the tube and urine continues to pass through as per usual but she is concerned that she did it wrong.  She also notes that his catheter has not been changed in over a month.  No fevers or chills.  Patient reports he feels well   Past Medical History:  Diagnosis Date  . Anemia   . CKD (chronic kidney disease), stage III ([place])   . Hypertension     Patient Active Problem List   Diagnosis Date Noted  . AKI (acute kidney injury) ([place])   . Other hydronephrosis   . Urinary retention   . ARF (acute renal failure) ([place]) 07/29/2017    History reviewed. No pertinent surgical history.  Prior to Admission medications   Medication Sig Start Date End Date Taking? Authorizing Provider  allopurinol (ZYLOPRIM) 300 MG tablet Take 300 mg by mouth daily. 12/08/17   [provider]  amLODipine (NORVASC) 10 MG tablet Take 10 mg by mouth daily.    [provider]  carvedilol (COREG) 25 MG tablet Take 25 mg by mouth 2 (two) times daily with a meal.    [provider]  lovastatin (MEVACOR) 40 MG tablet Take 1 tablet by mouth every evening. 07/16/17   [provider]     Allergies Ace inhibitors  Family History  Problem Relation Age of Onset  . Cancer Father     Social History Social History   Tobacco Use  . Smoking status: Former Smoker    Types: Cigarettes    Last attempt to quit: 04/07/1998    Years since quitting: 19.9  . Smokeless tobacco: Never  Used  Substance Use Topics  . Alcohol use: Not Currently  . Drug use: Never    Review of Systems  Constitutional: No fever/chills    Gastrointestinal: No abdominal pain.  No nausea, no vomiting.   Genitourinary: As above  Skin: Negative for abrasion or laceration     ____________________________________________   PHYSICAL EXAM:  VITAL SIGNS: ED Triage Vitals  Enc Vitals Group     BP 03/21/18 1150 (!) 145/70     Pulse Rate 03/21/18 1150 66     Resp 03/21/18 1150 16     Temp 03/21/18 1150 98.8 F (37.1 C)     Temp Source 03/21/18 1150 Oral     SpO2 03/21/18 1150 99 %     Weight 03/21/18 1152 72.6 kg (160 lb)     Height 03/21/18 1152 1.803 m (5\' 11" )     Head Circumference --      Peak Flow --      Pain Score 03/21/18 1150 7     Pain Loc --      Pain Edu? --      Excl. in [place]? --      Constitutional: Alert  Eyes: Conjunctivae are normal.    Cardiovascular: Normal rate, regular rhythm.  Respiratory: Normal respiratory effort.  No retractions. Genitourinary: Foley catheter in place, yellow urine in the bag,  no injury to the penis, normal scrotum  Neurologic:  Normal speech and language. No gross focal neurologic deficits are appreciated.   Skin:  Skin is warm, dry and intact.    ____________________________________________   LABS (all labs ordered are listed, but only abnormal results are displayed)  Labs Reviewed - No data to display ____________________________________________  EKG   ____________________________________________  RADIOLOGY  None ____________________________________________   PROCEDURES  Procedure(s) performed: No  Procedures   Critical Care performed: No ____________________________________________   INITIAL IMPRESSION / ASSESSMENT AND PLAN / ED COURSE  Pertinent labs & imaging results that were available during my care of the patient were reviewed by me and considered in my medical decision making (see chart for  details).  Patient's catheter is in place and draining appropriately, had nurse change catheter given that he has not had it swapped recently.  Nursing education provided for wife as well   ____________________________________________   FINAL CLINICAL IMPRESSION(S) / ED DIAGNOSES  Final diagnoses:  Problem with urinary catheter ([place])      NEW MEDICATIONS STARTED DURING THIS VISIT:  Discharge Medication List as of 03/21/2018  3:30 PM       Note:  This document was prepared using Dragon voice recognition software and may include unintentional dictation errors.    Lavonia Drafts, MD 03/21/18 [phone]

## 2018-03-21 NOTE — ED Triage Notes (Addendum)
Pt to ED with urinary catheter that wife states is "not working right". Pt has had catheter in place for several months. Per wife pt tripped this morning and pulled catheter.

## 2018-03-21 NOTE — ED Notes (Signed)
68F Foley catheter removed without difficulty, coude catheter tip noted.

## 2018-04-08 ENCOUNTER — Ambulatory Visit (INDEPENDENT_AMBULATORY_CARE_PROVIDER_SITE_OTHER): Payer: Medicare HMO | Admitting: Family Medicine

## 2018-04-08 DIAGNOSIS — R339 Retention of urine, unspecified: Secondary | ICD-10-CM

## 2018-04-08 NOTE — Progress Notes (Signed)
Cath Change/ Replacement  Patient is present today for a catheter change due to urinary retention.  63ml of water was removed from the balloon, a 16FR foley cath was removed with out difficulty.  Patient was cleaned and prepped in a sterile fashion with betadine and 2% lidocaine jelly was instilled into the urethra. A 16 coude foley cath was replaced into the bladder no complications were noted Urine return was noted 57ml and urine was yellow in color. The balloon was filled with 71ml of sterile water. A leg bag was attached for drainage.  A night bag was also given to the patient and patient was given instruction on how to change from one bag to another. Patient was given proper instruction on catheter care.    Preformed by: Elberta Leatherwood, CMA  Follow up: 1 month cath change

## 2018-04-28 ENCOUNTER — Encounter: Payer: Self-pay | Admitting: Emergency Medicine

## 2018-04-28 ENCOUNTER — Emergency Department: Payer: Medicare HMO

## 2018-04-28 ENCOUNTER — Inpatient Hospital Stay
Admission: EM | Admit: 2018-04-28 | Discharge: 2018-04-30 | DRG: 698 | Disposition: A | Discharge status: Home Health | Payer: Medicare HMO | Attending: Internal Medicine | Admitting: Internal Medicine

## 2018-04-28 ENCOUNTER — Other Ambulatory Visit: Payer: Self-pay

## 2018-04-28 DIAGNOSIS — N179 Acute kidney failure, unspecified: Secondary | ICD-10-CM | POA: Diagnosis present

## 2018-04-28 DIAGNOSIS — I129 Hypertensive chronic kidney disease with stage 1 through stage 4 chronic kidney disease, or unspecified chronic kidney disease: Secondary | ICD-10-CM | POA: Diagnosis present

## 2018-04-28 DIAGNOSIS — Z87891 Personal history of nicotine dependence: Secondary | ICD-10-CM | POA: Diagnosis not present

## 2018-04-28 DIAGNOSIS — A4159 Other Gram-negative sepsis: Secondary | ICD-10-CM | POA: Diagnosis present

## 2018-04-28 DIAGNOSIS — T83091A Other mechanical complication of indwelling urethral catheter, initial encounter: Secondary | ICD-10-CM | POA: Diagnosis not present

## 2018-04-28 DIAGNOSIS — R410 Disorientation, unspecified: Secondary | ICD-10-CM

## 2018-04-28 DIAGNOSIS — N320 Bladder-neck obstruction: Secondary | ICD-10-CM | POA: Diagnosis present

## 2018-04-28 DIAGNOSIS — Z87440 Personal history of urinary (tract) infections: Secondary | ICD-10-CM

## 2018-04-28 DIAGNOSIS — T83511A Infection and inflammatory reaction due to indwelling urethral catheter, initial encounter: Secondary | ICD-10-CM | POA: Diagnosis present

## 2018-04-28 DIAGNOSIS — N184 Chronic kidney disease, stage 4 (severe): Secondary | ICD-10-CM | POA: Diagnosis present

## 2018-04-28 DIAGNOSIS — Y846 Urinary catheterization as the cause of abnormal reaction of the patient, or of later complication, without mention of misadventure at the time of the procedure: Secondary | ICD-10-CM | POA: Diagnosis present

## 2018-04-28 DIAGNOSIS — I440 Atrioventricular block, first degree: Secondary | ICD-10-CM | POA: Diagnosis present

## 2018-04-28 DIAGNOSIS — R7989 Other specified abnormal findings of blood chemistry: Secondary | ICD-10-CM

## 2018-04-28 DIAGNOSIS — N390 Urinary tract infection, site not specified: Secondary | ICD-10-CM | POA: Diagnosis present

## 2018-04-28 DIAGNOSIS — G9341 Metabolic encephalopathy: Secondary | ICD-10-CM | POA: Diagnosis present

## 2018-04-28 DIAGNOSIS — Z79899 Other long term (current) drug therapy: Secondary | ICD-10-CM

## 2018-04-28 DIAGNOSIS — I248 Other forms of acute ischemic heart disease: Secondary | ICD-10-CM | POA: Diagnosis present

## 2018-04-28 DIAGNOSIS — Y738 Miscellaneous gastroenterology and urology devices associated with adverse incidents, not elsewhere classified: Secondary | ICD-10-CM | POA: Diagnosis present

## 2018-04-28 DIAGNOSIS — M109 Gout, unspecified: Secondary | ICD-10-CM | POA: Diagnosis present

## 2018-04-28 DIAGNOSIS — T83011A Breakdown (mechanical) of indwelling urethral catheter, initial encounter: Secondary | ICD-10-CM

## 2018-04-28 DIAGNOSIS — R6520 Severe sepsis without septic shock: Secondary | ICD-10-CM | POA: Diagnosis present

## 2018-04-28 DIAGNOSIS — A419 Sepsis, unspecified organism: Secondary | ICD-10-CM

## 2018-04-28 DIAGNOSIS — R778 Other specified abnormalities of plasma proteins: Secondary | ICD-10-CM

## 2018-04-28 HISTORY — DX: Retention of urine, unspecified: R33.9

## 2018-04-28 HISTORY — DX: Gout, unspecified: M10.9

## 2018-04-28 LAB — CBC WITH DIFFERENTIAL/PLATELET
ABS IMMATURE GRANULOCYTES: 0.03 10*3/uL (ref 0.00–0.07)
BASOS PCT: 0 %
Basophils Absolute: 0 10*3/uL (ref 0.0–0.1)
Eosinophils Absolute: 0.1 10*3/uL (ref 0.0–0.5)
Eosinophils Relative: 1 %
HEMATOCRIT: 35.2 % — AB (ref 39.0–52.0)
HEMOGLOBIN: 11.7 g/dL — AB (ref 13.0–17.0)
Immature Granulocytes: 0 %
Lymphocytes Relative: 13 %
Lymphs Abs: 1.3 10*3/uL (ref 0.7–4.0)
MCH: 27.5 pg (ref 26.0–34.0)
MCHC: 33.2 g/dL (ref 30.0–36.0)
MCV: 82.6 fL (ref 80.0–100.0)
MONO ABS: 1.1 10*3/uL — AB (ref 0.1–1.0)
Monocytes Relative: 12 %
NEUTROS ABS: 7.3 10*3/uL (ref 1.7–7.7)
NEUTROS PCT: 74 %
Platelets: 226 10*3/uL (ref 150–400)
RBC: 4.26 MIL/uL (ref 4.22–5.81)
RDW: 15.8 % — ABNORMAL HIGH (ref 11.5–15.5)
WBC: 9.8 10*3/uL (ref 4.0–10.5)
nRBC: 0 % (ref 0.0–0.2)

## 2018-04-28 LAB — URINALYSIS, COMPLETE (UACMP) WITH MICROSCOPIC
BILIRUBIN URINE: NEGATIVE
GLUCOSE, UA: NEGATIVE mg/dL
Ketones, ur: NEGATIVE mg/dL
NITRITE: NEGATIVE
PROTEIN: 100 mg/dL — AB
Specific Gravity, Urine: 1.012 (ref 1.005–1.030)
pH: 8 (ref 5.0–8.0)

## 2018-04-28 LAB — TROPONIN I
TROPONIN I: 0.07 ng/mL — AB (ref <0.03)
Troponin I: 0.08 ng/mL (ref <0.03)
Troponin I: 0.09 ng/mL (ref <0.03)

## 2018-04-28 LAB — COMPREHENSIVE METABOLIC PANEL
ALT: 12 U/L (ref 0–44)
AST: 22 U/L (ref 15–41)
Albumin: 4.1 g/dL (ref 3.5–5.0)
Alkaline Phosphatase: 50 U/L (ref 38–126)
Anion gap: 13 (ref 5–15)
BILIRUBIN TOTAL: 0.7 mg/dL (ref 0.3–1.2)
BUN: 42 mg/dL — AB (ref 8–23)
CHLORIDE: 102 mmol/L (ref 98–111)
CO2: 20 mmol/L — ABNORMAL LOW (ref 22–32)
Calcium: 11.1 mg/dL — ABNORMAL HIGH (ref 8.9–10.3)
Creatinine, Ser: 2.66 mg/dL — ABNORMAL HIGH (ref 0.61–1.24)
GFR, EST AFRICAN AMERICAN: 26 mL/min — AB (ref >60)
GFR, EST NON AFRICAN AMERICAN: 23 mL/min — AB (ref >60)
Glucose, Bld: 139 mg/dL — ABNORMAL HIGH (ref 70–99)
POTASSIUM: 4.1 mmol/L (ref 3.5–5.1)
Sodium: 135 mmol/L (ref 135–145)
TOTAL PROTEIN: 7.7 g/dL (ref 6.5–8.1)

## 2018-04-28 LAB — LACTIC ACID, PLASMA
LACTIC ACID, VENOUS: 4.7 mmol/L — AB (ref 0.5–1.9)
LACTIC ACID, VENOUS: 2.8 mmol/L — AB (ref 0.5–1.9)
Lactic Acid, Venous: 2.1 mmol/L (ref 0.5–1.9)
Lactic Acid, Venous: 1.4 mmol/L (ref 0.5–1.9)

## 2018-04-28 LAB — PROCALCITONIN: Procalcitonin: 0.2 ng/mL

## 2018-04-28 MED ORDER — ACETAMINOPHEN 325 MG PO TABS: 650.0000 mg | ORAL_TABLET | Freq: Four times a day (QID) | ORAL | Status: DC | PRN

## 2018-04-28 MED ORDER — SODIUM CHLORIDE 0.9 % IV BOLUS (SEPSIS)
1000.0000 mL | Freq: Once | INTRAVENOUS | Status: AC
  Administered 2018-04-28: 1000 mL via INTRAVENOUS

## 2018-04-28 MED ORDER — SODIUM CHLORIDE 0.9 % IV SOLN: 2.0000 g | INTRAVENOUS | Status: DC

## 2018-04-28 MED ORDER — ACETAMINOPHEN 650 MG RE SUPP: 650.0000 mg | Freq: Four times a day (QID) | RECTAL | Status: DC | PRN

## 2018-04-28 MED ORDER — PRAVASTATIN SODIUM 20 MG PO TABS
40.0000 mg | ORAL_TABLET | Freq: Every day | ORAL | Status: DC
  Administered 2018-04-28 – 2018-04-29 (×2): 40 mg via ORAL
  Filled 2018-04-28 (×2): qty 2

## 2018-04-28 MED ORDER — ONDANSETRON HCL 4 MG/2ML IJ SOLN: 4.0000 mg | Freq: Four times a day (QID) | INTRAMUSCULAR | Status: DC | PRN

## 2018-04-28 MED ORDER — SODIUM CHLORIDE 0.9 % IV SOLN
2.0000 g | Freq: Once | INTRAVENOUS | Status: AC
  Administered 2018-04-28: 2 g via INTRAVENOUS
  Filled 2018-04-28: qty 2

## 2018-04-28 MED ORDER — SODIUM CHLORIDE 0.9 % IV SOLN
INTRAVENOUS | Status: DC
  Administered 2018-04-28 – 2018-04-29 (×3): via INTRAVENOUS

## 2018-04-28 MED ORDER — ASPIRIN EC 81 MG PO TBEC
81.0000 mg | DELAYED_RELEASE_TABLET | Freq: Every day | ORAL | Status: DC
  Administered 2018-04-28 – 2018-04-30 (×3): 81 mg via ORAL
  Filled 2018-04-28 (×3): qty 1

## 2018-04-28 MED ORDER — ONDANSETRON HCL 4 MG PO TABS: 4.0000 mg | ORAL_TABLET | Freq: Four times a day (QID) | ORAL | Status: DC | PRN

## 2018-04-28 MED ORDER — SODIUM CHLORIDE 0.9 % IV BOLUS
1000.0000 mL | Freq: Once | INTRAVENOUS | Status: AC
  Administered 2018-04-28: 07:00:00 1000 mL via INTRAVENOUS

## 2018-04-28 MED ORDER — HEPARIN SODIUM (PORCINE) 5000 UNIT/ML IJ SOLN
5000.0000 [IU] | Freq: Three times a day (TID) | INTRAMUSCULAR | Status: DC
  Administered 2018-04-28 – 2018-04-30 (×7): 5000 [IU] via SUBCUTANEOUS
  Filled 2018-04-28 (×7): qty 1

## 2018-04-28 MED ORDER — ALBUTEROL SULFATE (2.5 MG/3ML) 0.083% IN NEBU: 2.5000 mg | INHALATION_SOLUTION | RESPIRATORY_TRACT | Status: DC | PRN

## 2018-04-28 MED ORDER — SODIUM CHLORIDE 0.9 % IV SOLN
1.0000 g | INTRAVENOUS | Status: AC
  Administered 2018-04-28 – 2018-04-29 (×2): 1 g via INTRAVENOUS
  Filled 2018-04-28 (×2): qty 1

## 2018-04-28 MED ORDER — POLYETHYLENE GLYCOL 3350 17 G PO PACK
17.0000 g | PACK | Freq: Every day | ORAL | Status: DC | PRN
  Administered 2018-04-29: 10:00:00 17 g via ORAL
  Filled 2018-04-28: qty 1

## 2018-04-28 MED ORDER — AMLODIPINE BESYLATE 5 MG PO TABS
10.0000 mg | ORAL_TABLET | Freq: Every day | ORAL | Status: DC
  Administered 2018-04-28 – 2018-04-30 (×3): 10 mg via ORAL
  Filled 2018-04-28 (×3): qty 2

## 2018-04-28 NOTE — Progress Notes (Signed)
CODE SEPSIS - PHARMACY COMMUNICATION  **Broad Spectrum Antibiotics should be administered within 1 hour of Sepsis diagnosis**  Time Code Sepsis Called/Page Received: 1610 9604  Antibiotics Ordered: 5409 8119  Time of 1st antibiotic administration: 0122 0340  Additional action taken by pharmacy:   If necessary, Name of Provider/Nurse Contacted:     Eloise Harman ,PharmD Clinical Pharmacist  04/28/2018  4:55 AM

## 2018-04-28 NOTE — Care Management Note (Signed)
Case Management Note  Patient Details  Name: Troy Berry. MRN: [id number] Date of Birth: Jun 06, 1945  Subjective/Objective:                  Admitted to [facility] with the diagnosis of delirium. Lives with wife, Letta Median [phone]). Seen at [facility] 2-3 weeks ago. Prescriptions are filled at [facility].  No home health. No skilled facility. No home oxygen. Rolling walker in the home, no other equipment.  Takes care of all basic activities of daily living, doesn't drive. Family helps with errands.  Last fall was 6 months ago. Decreased appetite since becoming sick. Lost weight, but not sure how much weight lost. Chronic foley .  Action/Plan: Physical therapy is recommending home with home health and physical therapy.  [place] query. Copy placed on chart. Copy given to patient. [facility].  Will need to fax orders to [facility] when available,    Expected Discharge Date:                  Expected Discharge Plan:     In-House Referral:   yes  Discharge planning Services   yes  Post Acute Care Choice:   yes Choice offered to:   patient and wife  DME Arranged:    DME Agency:     HH Arranged:   Will need to be faxed                                                         [facility] Agency:   [facility]  Status of Service:     If discussed at [place] of Stay Meetings, dates discussed:    Additional Comments:  Shelbie Ammons, RN MSN CCM Care Management [phone] 04/28/2018, 3:16 PM

## 2018-04-28 NOTE — H&P (Signed)
[facility] [place] NAME: Franciscojavier Wronski    MR#:  [id number]  DATE OF BIRTH:  1945-08-02  DATE OF ADMISSION:  04/28/2018  PRIMARY CARE PHYSICIAN: [facility] [place]   REQUESTING/REFERRING PHYSICIAN: Dr. Karma Greaser  CHIEF COMPLAINT:   Chief Complaint  Patient presents with  . Abdominal Pain    HISTORY OF PRESENT ILLNESS:  Miller Limehouse  is a 73 y.o. male with a known history of hypertension, CKD stage IV, chronic indwelling Foley catheter, recurrent UTIs presents to the emergency room from home due to weakness, confusion and abdominal pain.  Patient was found to have a clogged Foley catheter with distended bladder.  Foley catheter was replaced with thick purulent urine.  Patient has been hypertensive but lactic acid significantly elevated at 4.9.  Found to have UTI.  Is being admitted to the hospitalist service. Patient is confused and unable to contribute much history.  He is awake alert and conversational.  PAST MEDICAL HISTORY:   Past Medical History:  Diagnosis Date  . Anemia   . CKD (chronic kidney disease), stage III ([place])   . Gout   . Hypertension   . Urinary retention    in-dwelling Foley since Nov 2019, sees Dr. Hollice Espy    PAST SURGICAL HISTORY:  History reviewed. No pertinent surgical history.  SOCIAL HISTORY:   Social History   Tobacco Use  . Smoking status: Former Smoker    Types: Cigarettes    Last attempt to quit: 04/07/1998    Years since quitting: 20.0  . Smokeless tobacco: Never Used  Substance Use Topics  . Alcohol use: Not Currently    FAMILY HISTORY:   Family History  Problem Relation Age of Onset  . Cancer Father     DRUG ALLERGIES:   Allergies  Allergen Reactions  . Ace Inhibitors     Other reaction(s): Other (See Comments) lip swelling    REVIEW OF SYSTEMS:   Review of Systems  Unable to perform ROS: Mental status change    MEDICATIONS AT HOME:   Prior to  Admission medications   Medication Sig Start Date End Date Taking? Authorizing Provider  allopurinol (ZYLOPRIM) 300 MG tablet Take 300 mg by mouth daily. 12/08/17   [provider]  amLODipine (NORVASC) 10 MG tablet Take 10 mg by mouth daily.    [provider]  lovastatin (MEVACOR) 40 MG tablet Take 1 tablet by mouth every evening. 07/16/17   [provider]     VITAL SIGNS:  Blood pressure (!) 170/79, pulse 70, temperature 98 F (36.7 C), temperature source Oral, resp. rate 19, height 5\' 11"  (1.803 m), weight 80 kg, SpO2 100 %.  PHYSICAL EXAMINATION:  Physical Exam  GENERAL:  73 y.o.-year-old patient lying in the bed with no acute distress.  EYES: Pupils equal, round, reactive to light and accommodation. No scleral icterus. Extraocular muscles intact.  HEENT: Head atraumatic, normocephalic. Oropharynx and nasopharynx clear. No oropharyngeal erythema, moist oral mucosa  NECK:  Supple, no jugular venous distention. No thyroid enlargement, no tenderness.  LUNGS: Normal breath sounds bilaterally, no wheezing, rales, rhonchi. No use of accessory muscles of respiration.  CARDIOVASCULAR: S1, S2 normal. No murmurs, rubs, or gallops.  ABDOMEN: Soft, nontender, nondistended. Bowel sounds present. No organomegaly or mass.  Foley catheter in place  EXTREMITIES: No pedal edema, cyanosis, or clubbing. + 2 pedal & radial pulses b/l.   NEUROLOGIC: Cranial nerves II through XII are intact. No focal Motor  or sensory deficits appreciated b/l PSYCHIATRIC: The patient is alert and awake.  Pleasantly confused SKIN: No obvious rash, lesion, or ulcer.   LABORATORY PANEL:   CBC Recent Labs  Lab 04/28/18 0211  WBC 9.8  HGB 11.7*  HCT 35.2*  PLT 226   ------------------------------------------------------------------------------------------------------------------  Chemistries  Recent Labs  Lab 04/28/18 0211  NA 135  K 4.1  CL 102  CO2 20*  GLUCOSE 139*  BUN 42*   CREATININE 2.66*  CALCIUM 11.1*  AST 22  ALT 12  ALKPHOS 50  BILITOT 0.7   ------------------------------------------------------------------------------------------------------------------  Cardiac Enzymes Recent Labs  Lab 04/28/18 0211  TROPONINI 0.08*   ------------------------------------------------------------------------------------------------------------------  RADIOLOGY:  Ct Abdomen Pelvis Wo Contrast  Result Date: 04/28/2018 CLINICAL DATA:  Abdominal distension and lower abdominal pain EXAM: CT ABDOMEN AND PELVIS WITHOUT CONTRAST TECHNIQUE: Multidetector CT imaging of the abdomen and pelvis was performed following the standard protocol without IV contrast. COMPARISON:  CT 07/29/2017 FINDINGS: Lower chest: Lung bases demonstrate scattered lung nodules, many of which are calcified. No acute consolidation or effusion. Coronary vascular calcification. Cardiomegaly. Calcified hilar and mediastinal lymph nodes. Hepatobiliary: Numerous stones within the gallbladder. No focal hepatic abnormality or biliary dilatation Pancreas: Unremarkable. No pancreatic ductal dilatation or surrounding inflammatory changes. Spleen: Normal in size without focal abnormality. Adrenals/Urinary Tract: Adrenal glands are normal. Probable cysts within the left kidney. Hyperdense lesion mid to lower left kidney, possible hemorrhagic or proteinaceous cyst. Renal vascular calcification. Slight enlargement of the renal pelvises with prominent ureters bilaterally. Foley catheter in the bladder. Bladder appears thick walled and there is hazy edema within the pelvis around the bladder. Increased density within the bladder around the Foley catheter, may reflect stones within the bladder. Stomach/Bowel: Stomach is within normal limits. Appendix appears normal. No evidence of bowel wall thickening, distention, or inflammatory changes. Vascular/Lymphatic: Severe aortic atherosclerosis. No aneurysm. No significantly enlarged  lymph nodes. Reproductive: Enlarged prostate gland Other: Negative for free air or free fluid. Musculoskeletal: Degenerative changes without acute or suspicious abnormality. IMPRESSION: 1. Prominent renal pelvises with dilated ureters bilaterally. Foley catheter in the decompressed bladder. Bladder appears thick walled and there is edema/soft tissue stranding in the pelvis, suggesting possible cystitis. No ureteral stones. High density within the decompressed urinary bladder around the Foley catheter balloon, may reflect bladder stones. 2. Numerous gallstones 3. Cardiomegaly.  Evidence of prior granulomatous disease. 4. Enlarged prostate gland Electronically Signed   By: Donavan Foil M.D.   On: 04/28/2018 03:46   Dg Chest Portable 1 View  Result Date: 04/28/2018 CLINICAL DATA:  Shortness of breath EXAM: PORTABLE CHEST 1 VIEW COMPARISON:  07/29/2017 FINDINGS: Linear scarring or atelectasis at the right base. Mild cardiomegaly. Calcified hilar lymph nodes. No pleural effusion or pneumothorax. IMPRESSION: 1. Subsegmental atelectasis or scar at the right base 2. Mild cardiomegaly Electronically Signed   By: Donavan Foil M.D.   On: 04/28/2018 02:59     IMPRESSION AND PLAN:   *Catheter related UTI with severe sepsis and acute encephalopathy Start IV ceftriaxone.  Urine cultures and blood cultures. Elevated lactic acid.  Stat bolus of normal saline.  Continue maintenance IV fluids. Monitor input and output.  Foley catheter changed.  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  Should improve now that Foley catheter has been changed.  Continue IV fluids.  Monitor input and output.  Repeat BMP in the morning.  *Hypertension.  Continue amlodipine  *Mild elevation troponin secondary to demand ischemia  in setting of CKD stage IV.  Will repeat troponin.  No chest pain or EKG changes.  DVT prophylaxis with heparin  All the records are reviewed and case  discussed with ED provider. Management plans discussed with the patient, family and they are in agreement.  CODE STATUS: Full code  TOTAL CRITICAL CARE  TIME TAKING CARE OF THIS PATIENT: 35 minutes.   Leia Alf Jacynda Brunke M.D on 04/28/2018 at 5:39 AM  Between 7am to 6pm - Pager - [phone]  After 6pm go to [email or web address] - password [email or web address] [place] [facility]  Office  [phone]  CC: Primary care physician; [facility]  Note: This dictation was prepared with Dragon dictation along with smaller phrase technology. Any transcriptional errors that result from this process are unintentional.

## 2018-04-28 NOTE — Progress Notes (Signed)
Pharmacy Antibiotic Note  Troy Berry. is a 73 y.o. male admitted on 04/28/2018 with UTI/possible sepsis.  Pharmacy has been consulted for cefepime dosing.  Plan: Cefepime 2 grams q 24 hours ordered.  Height: 5\' 11"  (180.3 cm) Weight: 176 lb 6.4 oz (80 kg) IBW/kg (Calculated) : 75.3  Temp (24hrs), Avg:98 F (36.7 C), Min:98 F (36.7 C), Max:98 F (36.7 C)  Recent Labs  Lab 04/28/18 0211  WBC 9.8  CREATININE 2.66*  LATICACIDVEN 4.7*    Estimated Creatinine Clearance: 26.3 mL/min (A) (by C-G formula based on SCr of 2.66 mg/dL (H)).    Allergies  Allergen Reactions  . Ace Inhibitors     Other reaction(s): Other (See Comments) lip swelling    Antimicrobials this admission: Cefepime 1/22  >>    >>   Dose adjustments this admission:   Microbiology results: 1/22 BCx: pending 1/22 UCx: pending      1/22 UA: pending Thank you for allowing pharmacy to be a part of this patient's care.  Alisia Vanengen S 04/28/2018 3:05 AM

## 2018-04-28 NOTE — ED Provider Notes (Signed)
[facility] Emergency Department Provider Note  ____________________________________________   First MD Initiated Contact with Patient 04/28/18 0209     (approximate)  I have reviewed the triage vital signs and the nursing notes.   HISTORY  Chief Complaint Abdominal Pain  Level 5 caveat:  history/ROS limited by altered mental status/confusion and/or critical illness.   HPI Troy Berry. is a 73 y.o. male with medical history as listed below who presents by EMS for evaluation of a variety of complaints.  History was very minimal from the family to the paramedics and the patient is confused and unable to provide much history.  It sounds as if the patient has not had much urine output into his Foley bag since yesterday and has had abdominal pain and swelling as well as some shortness of breath.  He was quite disheveled with dried feces on his fingers upon arrival and in distress with rapid breathing and saying that his belly hurt.  He denies shortness of breath to me but reports pain.  He denies fever but he is not a reliable historian by virtue of his acute illness.  See hospital course for additional information.  Past Medical History:  Diagnosis Date  . Anemia   . CKD (chronic kidney disease), stage III ([place])   . Gout   . Hypertension   . Urinary retention    in-dwelling Foley since Nov 2019, sees Dr. Hollice Espy    Patient Active Problem List   Diagnosis Date Noted  . AKI (acute kidney injury) ([place])   . Other hydronephrosis   . Urinary retention   . ARF (acute renal failure) ([place]) 07/29/2017    History reviewed. No pertinent surgical history.  Prior to Admission medications   Medication Sig Start Date End Date Taking? Authorizing Provider  allopurinol (ZYLOPRIM) 300 MG tablet Take 300 mg by mouth daily. 12/08/17   [provider]  amLODipine (NORVASC) 10 MG tablet Take 10 mg by mouth daily.    [provider]    lovastatin (MEVACOR) 40 MG tablet Take 1 tablet by mouth every evening. 07/16/17   [provider]    Allergies Ace inhibitors  Family History  Problem Relation Age of Onset  . Cancer Father     Social History Social History   Tobacco Use  . Smoking status: Former Smoker    Types: Cigarettes    Last attempt to quit: 04/07/1998    Years since quitting: 20.0  . Smokeless tobacco: Never Used  Substance Use Topics  . Alcohol use: Not Currently  . Drug use: Never    Review of Systems Level 5 caveat:  history/ROS limited by altered mental status/confusion and/or critical illness.    ____________________________________________   PHYSICAL EXAM:  VITAL SIGNS: ED Triage Vitals  Enc Vitals Group     BP 04/28/18 0203 (!) 169/93     Pulse Rate 04/28/18 0203 100     Resp 04/28/18 0203 (!) 44     Temp 04/28/18 0203 98 F (36.7 C)     Temp Source 04/28/18 0203 Oral     SpO2 04/28/18 0203 100 %     Weight 04/28/18 0204 80 kg (176 lb 6.4 oz)     Height 04/28/18 0204 1.803 m (5\' 11" )     Head Circumference --      Peak Flow --      Pain Score --      Pain Loc --  Pain Edu? --      Excl. in [place]? --     Constitutional: Awake and alert but confused and unable to provide much history.  In mild distress.  Disheveled. Eyes: Conjunctivae are normal.  Head: Atraumatic. Nose: No congestion/rhinnorhea. Mouth/Throat: Mucous membranes are dry. Neck: No stridor.  No meningeal signs.   Cardiovascular: Sinus tachycardia, regular rhythm. Good peripheral circulation. Grossly normal heart sounds. Respiratory: Increased respiratory rate with normal respiratory effort.  No retractions. Lungs CTAB. Gastrointestinal: Soft with mild diffuse tenderness to palpation but no rebound and no guarding. No distention after Foley catheter was changed by the ED nurses. Musculoskeletal: Thin habitus.  No lower extremity tenderness nor edema. No gross deformities of extremities. Neurologic:   Normal speech and language. No gross focal neurologic deficits are appreciated.  Unable to participate fully in neurological exam. Skin:  Skin is warm, dry and intact. No rash noted.   ____________________________________________   LABS (all labs ordered are listed, but only abnormal results are displayed)  Labs Reviewed  LACTIC ACID, PLASMA - Abnormal; Notable for the following components:      Result Value   Lactic Acid, Venous 4.7 (*)    All other components within normal limits  LACTIC ACID, PLASMA - Abnormal; Notable for the following components:   Lactic Acid, Venous 2.8 (*)    All other components within normal limits  COMPREHENSIVE METABOLIC PANEL - Abnormal; Notable for the following components:   CO2 20 (*)    Glucose, Bld 139 (*)    BUN 42 (*)    Creatinine, Ser 2.66 (*)    Calcium 11.1 (*)    GFR calc non Af Amer 23 (*)    GFR calc Af Amer 26 (*)    All other components within normal limits  CBC WITH DIFFERENTIAL/PLATELET - Abnormal; Notable for the following components:   Hemoglobin 11.7 (*)    HCT 35.2 (*)    RDW 15.8 (*)    Monocytes Absolute 1.1 (*)    All other components within normal limits  TROPONIN I - Abnormal; Notable for the following components:   Troponin I 0.08 (*)    All other components within normal limits  URINALYSIS, COMPLETE (UACMP) WITH MICROSCOPIC - Abnormal; Notable for the following components:   APPearance TURBID (*)    Hgb urine dipstick MODERATE (*)    Protein, ur 100 (*)    Leukocytes, UA MODERATE (*)    RBC / HPF >50 (*)    WBC, UA >50 (*)    Bacteria, UA MANY (*)    All other components within normal limits  URINE CULTURE  CULTURE, BLOOD (ROUTINE X 2)  CULTURE, BLOOD (ROUTINE X 2)  PROCALCITONIN  LACTIC ACID, PLASMA  TROPONIN I   ____________________________________________  EKG  ED ECG REPORT I, Hinda Kehr, the attending physician, personally viewed and interpreted this ECG.  Date: 04/28/2018 EKG Time: 2:02  AM Rate: 98 Rhythm: Sinus rhythm with first-degree AV block QRS Axis: normal Intervals: Prolonged PR interval of 261 ms ST/T Wave abnormalities: Non-specific ST segment / T-wave changes, but no clear evidence of acute ischemia. Narrative Interpretation: no definitive evidence of acute ischemia; does not meet STEMI criteria.   ____________________________________________  RADIOLOGY I, Hinda Kehr, personally viewed and evaluated these images (plain radiographs) as part of my medical decision making, as well as reviewing the written report by the radiologist.  ED MD interpretation: No evidence of acute infection or other acute intrathoracic abnormality.  Official radiology report(s): Ct  Abdomen Pelvis Wo Contrast  Result Date: 04/28/2018 CLINICAL DATA:  Abdominal distension and lower abdominal pain EXAM: CT ABDOMEN AND PELVIS WITHOUT CONTRAST TECHNIQUE: Multidetector CT imaging of the abdomen and pelvis was performed following the standard protocol without IV contrast. COMPARISON:  CT 07/29/2017 FINDINGS: Lower chest: Lung bases demonstrate scattered lung nodules, many of which are calcified. No acute consolidation or effusion. Coronary vascular calcification. Cardiomegaly. Calcified hilar and mediastinal lymph nodes. Hepatobiliary: Numerous stones within the gallbladder. No focal hepatic abnormality or biliary dilatation Pancreas: Unremarkable. No pancreatic ductal dilatation or surrounding inflammatory changes. Spleen: Normal in size without focal abnormality. Adrenals/Urinary Tract: Adrenal glands are normal. Probable cysts within the left kidney. Hyperdense lesion mid to lower left kidney, possible hemorrhagic or proteinaceous cyst. Renal vascular calcification. Slight enlargement of the renal pelvises with prominent ureters bilaterally. Foley catheter in the bladder. Bladder appears thick walled and there is hazy edema within the pelvis around the bladder. Increased density within the bladder  around the Foley catheter, may reflect stones within the bladder. Stomach/Bowel: Stomach is within normal limits. Appendix appears normal. No evidence of bowel wall thickening, distention, or inflammatory changes. Vascular/Lymphatic: Severe aortic atherosclerosis. No aneurysm. No significantly enlarged lymph nodes. Reproductive: Enlarged prostate gland Other: Negative for free air or free fluid. Musculoskeletal: Degenerative changes without acute or suspicious abnormality. IMPRESSION: 1. Prominent renal pelvises with dilated ureters bilaterally. Foley catheter in the decompressed bladder. Bladder appears thick walled and there is edema/soft tissue stranding in the pelvis, suggesting possible cystitis. No ureteral stones. High density within the decompressed urinary bladder around the Foley catheter balloon, may reflect bladder stones. 2. Numerous gallstones 3. Cardiomegaly.  Evidence of prior granulomatous disease. 4. Enlarged prostate gland Electronically Signed   By: Donavan Foil M.D.   On: 04/28/2018 03:46   Dg Chest Portable 1 View  Result Date: 04/28/2018 CLINICAL DATA:  Shortness of breath EXAM: PORTABLE CHEST 1 VIEW COMPARISON:  07/29/2017 FINDINGS: Linear scarring or atelectasis at the right base. Mild cardiomegaly. Calcified hilar lymph nodes. No pleural effusion or pneumothorax. IMPRESSION: 1. Subsegmental atelectasis or scar at the right base 2. Mild cardiomegaly Electronically Signed   By: Donavan Foil M.D.   On: 04/28/2018 02:59    ____________________________________________   PROCEDURES  Critical Care performed: Yes, see critical care procedure note(s)   Procedure(s) performed:   .Critical Care Performed by: Hinda Kehr, MD Authorized by: Hinda Kehr, MD   Critical care provider statement:    Critical care time (minutes):  30   Critical care time was exclusive of:  Separately billable procedures and treating other patients   Critical care was necessary to treat or prevent  imminent or life-threatening deterioration of the following conditions:  Sepsis   Critical care was time spent personally by me on the following activities:  Development of treatment plan with patient or surrogate, discussions with consultants, evaluation of patient's response to treatment, examination of patient, obtaining history from patient or surrogate, ordering and performing treatments and interventions, ordering and review of laboratory studies, ordering and review of radiographic studies, pulse oximetry, re-evaluation of patient's condition and review of old charts     ____________________________________________   INITIAL IMPRESSION / [place] / ED COURSE  As part of my medical decision making, I reviewed the following data within the [place] notes reviewed and incorporated, Labs reviewed , EKG interpreted , Old chart reviewed, Radiograph reviewed , Discussed with admitting physician  and Notes from prior ED visits  Differential diagnosis includes, but is not limited to, urinary retention secondary to failure of indwelling Foley catheter, UTI/pyelonephritis associated with the indwelling Foley, ureteral stones in the setting of infection, other acute intra-abdominal infection such as diverticulitis or appendicitis, acute intracranial hemorrhage or CVA, ACS, pneumonia.  The patient's distress came down markedly after the Foley catheter was changed.  Prior to the nurses changing the catheter, they performed a bladder scan which revealed a large volume of urine.  After changing the Foley at least 750 mL of thick, cloudy, and foul-smelling urine with intermittent blood clots drained out.  The patient says that he feels better but is still confused and unable to provide much detail.  He still has some mild tenderness to palpation throughout the abdomen.  I anticipate a catheter associated infection that is likely been exacerbated by the acute retention in  the setting of a Foley catheter blockage or failure.  I will perform a work-up and will hold off on empiric antibiotics until I have a little bit more detail.  Broad laboratory evaluation is pending.  Clinical Course as of Apr 28 638  Wed Apr 28, 2018  0230 WBC: 9.8 [CF]  0255 Although the patient has tachypnea which is likely secondary to the pain from the urinary obstruction and malfunctioning Foley catheter and does not have any other serious criteria, he has a grossly infected urine in the setting of Foley catheter with a lactic acid of 4.7 and altered mental status/confusion.  I will err on the side of caution and call code sepsis.  There is no sign of septic shock and I am ordering 1 L normal saline in addition to cefepime 2 g IV for catheter associated infection.  I have added on a procalcitonin and blood cultures prior to antibiotic administration.  Urinalysis and urine culture are still pending but the urine specimen that I visualized appeared very cloudy with heavy sediment consistent with an infection.  Chest x-ray was unremarkable.  I have ordered a CT abdomen/pelvis without oral or IV contrast for the sake of ruling out any other acute issues such as a ureteral stone given that the patient is not able to provide much additional history and he does appear acutely ill.  The patient will require admission.  Lactic Acid, Venous(!!): 4.7 [CF]  0349 reassuring procalcitonin   Procalcitonin: 0.20 [CF]  4332 Discussed case with Dr. Darvin Neighbours by phone who will admit.   [CF]  0521 Grossly infected urine as anticipated.  Urinalysis, Complete w Microscopic(!) [CF]    Clinical Course User Index [CF] Hinda Kehr, MD    ____________________________________________  FINAL CLINICAL IMPRESSION(S) / ED DIAGNOSES  Final diagnoses:  Malfunction of Foley catheter, initial encounter ([place])  Sepsis, due to unspecified organism, unspecified whether acute organ dysfunction present [facility])  Urinary tract  infection associated with indwelling urethral catheter, initial encounter ([place])  Delirium  Elevated lactic acid level  Elevated troponin I level     MEDICATIONS GIVEN DURING THIS VISIT:  Medications  heparin injection 5,000 Units (5,000 Units Subcutaneous Given 04/28/18 0620)  acetaminophen (TYLENOL) tablet 650 mg (has no administration in time range)    Or  acetaminophen (TYLENOL) suppository 650 mg (has no administration in time range)  polyethylene glycol (MIRALAX / GLYCOLAX) packet 17 g (has no administration in time range)  ondansetron (ZOFRAN) tablet 4 mg (has no administration in time range)    Or  ondansetron (ZOFRAN) injection 4 mg (has no administration in time range)  albuterol (PROVENTIL) (2.5  MG/3ML) 0.083% nebulizer solution 2.5 mg (has no administration in time range)  0.9 %  sodium chloride infusion ( Intravenous New Bag/Given 04/28/18 0622)  sodium chloride 0.9 % bolus 1,000 mL (has no administration in time range)  cefTRIAXone (ROCEPHIN) 1 g in sodium chloride 0.9 % 100 mL IVPB (has no administration in time range)  amLODipine (NORVASC) tablet 10 mg (has no administration in time range)  pravastatin (PRAVACHOL) tablet 40 mg (has no administration in time range)  sodium chloride 0.9 % bolus 1,000 mL (0 mLs Intravenous Stopped 04/28/18 0452)  ceFEPIme (MAXIPIME) 2 g in sodium chloride 0.9 % 100 mL IVPB (0 g Intravenous Stopped 04/28/18 0452)     ED Discharge Orders    None       Note:  This document was prepared using Dragon voice recognition software and may include unintentional dictation errors.    Hinda Kehr, MD 04/28/18 [phone]

## 2018-04-28 NOTE — Progress Notes (Signed)
PT Cancellation Note  Patient Details Name: Troy Berry. MRN: [id number] DOB: [birth year]   Cancelled Treatment:    Reason Eval/Treat Not Completed: Patient at procedure or test/unavailable. Order received and chart reviewed.  Pt currently having lab values taken.  Will re-attempt later when time allows.  Roxanne Gates, PT, DPT  Roxanne Gates 04/28/2018, 1:09 PM

## 2018-04-28 NOTE — Progress Notes (Signed)
*  Catheter related UTI with severe sepsis and acute encephalopathy Start IV ceftriaxone.  Urine cultures and blood cultures. Elevated lactic acid.  Stat bolus of normal saline.  Continue maintenance IV fluids. Monitor input and output.  Foley catheter changed.  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  Should improve now that Foley catheter has been changed.  Continue IV fluids.  Monitor input and output.  Repeat BMP in the morning.  *Hypertension.  Continue amlodipine  *Mild elevation troponin secondary to demand ischemia in setting of CKD stage IV.  Will repeat troponin.  No chest pain or EKG changes.  DVT prophylaxis with heparin Agree with above

## 2018-04-28 NOTE — ED Notes (Signed)
Date and time results received: 04/28/18  2:49 AM  (use smartphrase ".now" to insert current time)  Test: trop / lactic Critical Value: 0.08 / 4.7   Name of Provider Notified: Karma Greaser  Orders Received? Or Actions Taken?: Orders Received - See Orders for details

## 2018-04-28 NOTE — ED Notes (Signed)
ED TO INPATIENT HANDOFF REPORT  Name/Age/Gender Troy Berry. 73 y.o. male  Code Status    Code Status Orders  (From admission, onward)         Start     Ordered   04/28/18 0526  Full code  Continuous     04/28/18 0526        Code Status History    Date Active Date Inactive Code Status Order ID Comments User Context   07/29/2017 1530 07/31/2017 1700 Full Code [id number]  Demetrios Loll, MD ED      Home/SNF/Other Home  Chief Complaint Abdominal Pain  Level of Care/Admitting Diagnosis ED Disposition    ED Disposition Condition [place]: [place] [[id number]]  Level of Care: Med-Surg [16]  Diagnosis: UTI (urinary tract infection) [916945]  Admitting Physician: Hillary Bow [[id number]]  Attending Physician: Hillary Bow [[id number]]  Estimated length of stay: past midnight tomorrow  Certification:: I certify this patient will need inpatient services for at least 2 midnights  PT Class (Do Not Modify): Inpatient [101]  PT Acc Code (Do Not Modify): Private [1]       Medical History Past Medical History:  Diagnosis Date  . Anemia   . CKD (chronic kidney disease), stage III ([place])   . Gout   . Hypertension   . Urinary retention    in-dwelling [place] since Nov 2019, sees Dr. Hollice Espy    Allergies Allergies  Allergen Reactions  . Ace Inhibitors     Other reaction(s): Other (See Comments) lip swelling    IV Location/Drains/Wounds Patient Lines/Drains/Airways Status   Active Line/Drains/Airways    Name:   Placement date:   Placement time:   Site:   Days:   Peripheral IV 04/28/18 Left Antecubital   04/28/18    0210    Antecubital   less than 1   Urethral Catheter Wilfred Lacy, EDT Non-latex 16 Fr.   03/21/18    1546    Non-latex   38   Urethral Catheter Donalynn Furlong, RN Latex 16 Fr.   04/28/18    0223    Latex   less than 1          Labs/Imaging Results for orders placed or performed during the hospital encounter of  04/28/18 (from the past 48 hour(s))  Lactic acid, plasma     Status: Abnormal   Collection Time: 04/28/18  2:11 AM  Result Value Ref Range   Lactic Acid, Venous 4.7 (HH) 0.5 - 1.9 mmol/L    Comment: CRITICAL RESULT CALLED TO, READ BACK BY AND VERIFIED WITH JENN Brinden Kincheloe @0244  04/28/18 AKT Performed at [facility], [address] [place], [place] [address]   Comprehensive metabolic panel     Status: Abnormal   Collection Time: 04/28/18  2:11 AM  Result Value Ref Range   Sodium 135 135 - 145 mmol/L   Potassium 4.1 3.5 - 5.1 mmol/L   Chloride 102 98 - 111 mmol/L   CO2 20 (L) 22 - 32 mmol/L   Glucose, Bld 139 (H) 70 - 99 mg/dL   BUN 42 (H) 8 - 23 mg/dL   Creatinine, Ser 2.66 (H) 0.61 - 1.24 mg/dL   Calcium 11.1 (H) 8.9 - 10.3 mg/dL   Total Protein 7.7 6.5 - 8.1 g/dL   Albumin 4.1 3.5 - 5.0 g/dL   AST 22 15 - 41 U/L   ALT 12 0 - 44 U/L   Alkaline Phosphatase 50 38 -  126 U/L   Total Bilirubin 0.7 0.3 - 1.2 mg/dL   GFR calc non Af Amer 23 (L) >60 mL/min   GFR calc Af Amer 26 (L) >60 mL/min   Anion gap 13 5 - 15    Comment: Performed at [facility], [address] [place], [place] [address]  CBC with Differential     Status: Abnormal   Collection Time: 04/28/18  2:11 AM  Result Value Ref Range   WBC 9.8 4.0 - 10.5 K/uL   RBC 4.26 4.22 - 5.81 MIL/uL   Hemoglobin 11.7 (L) 13.0 - 17.0 g/dL   HCT 35.2 (L) 39.0 - 52.0 %   MCV 82.6 80.0 - 100.0 fL   MCH 27.5 26.0 - 34.0 pg   MCHC 33.2 30.0 - 36.0 g/dL   RDW 15.8 (H) 11.5 - 15.5 %   Platelets 226 150 - 400 K/uL   nRBC 0.0 0.0 - 0.2 %   Neutrophils Relative % 74 %   Neutro Abs 7.3 1.7 - 7.7 K/uL   Lymphocytes Relative 13 %   Lymphs Abs 1.3 0.7 - 4.0 K/uL   Monocytes Relative 12 %   Monocytes Absolute 1.1 (H) 0.1 - 1.0 K/uL   Eosinophils Relative 1 %   Eosinophils Absolute 0.1 0.0 - 0.5 K/uL   Basophils Relative 0 %   Basophils Absolute 0.0 0.0 - 0.1 K/uL   Immature Granulocytes 0 %   Abs Immature Granulocytes  0.03 0.00 - 0.07 K/uL    Comment: Performed at [facility], [address] [place], [place] [address]  Troponin I - ONCE - STAT     Status: Abnormal   Collection Time: 04/28/18  2:11 AM  Result Value Ref Range   Troponin I 0.08 (HH) <0.03 ng/mL    Comment: CRITICAL RESULT CALLED TO, READ BACK BY AND VERIFIED WITH JENN Armondo Cech @0244  04/28/18 AKT Performed at [facility], [address] [place], [place] [address]   Urinalysis, Complete w Microscopic     Status: Abnormal   Collection Time: 04/28/18  2:11 AM  Result Value Ref Range   Color, Urine YELLOW YELLOW   APPearance TURBID (A) CLEAR   Specific Gravity, Urine 1.012 1.005 - 1.030   pH 8.0 5.0 - 8.0   Glucose, UA NEGATIVE NEGATIVE mg/dL   Hgb urine dipstick MODERATE (A) NEGATIVE   Bilirubin Urine NEGATIVE NEGATIVE   Ketones, ur NEGATIVE NEGATIVE mg/dL   Protein, ur 100 (A) NEGATIVE mg/dL   Nitrite NEGATIVE NEGATIVE   Leukocytes, UA MODERATE (A) NEGATIVE   RBC / HPF >50 (H) 0 - 5 RBC/hpf   WBC, UA >50 (H) 0 - 5 WBC/hpf   Bacteria, UA MANY (A) NONE SEEN   Squamous Epithelial / LPF 0-5 0 - 5    Comment: Performed at [facility], [address] [place], [place] [address]  Procalcitonin     Status: None   Collection Time: 04/28/18  2:11 AM  Result Value Ref Range   Procalcitonin 0.20 ng/mL    Comment:        Interpretation: PCT (Procalcitonin) <= 0.5 ng/mL: Systemic infection (sepsis) is not likely. Local bacterial infection is possible. (NOTE)       Sepsis PCT Algorithm           Lower Respiratory Tract  Infection PCT Algorithm    ----------------------------     ----------------------------         PCT < 0.25 ng/mL                PCT < 0.10 ng/mL         Strongly encourage             Strongly discourage   discontinuation of antibiotics    initiation of antibiotics    ----------------------------     -----------------------------       PCT 0.25 - 0.50 ng/mL             PCT 0.10 - 0.25 ng/mL               OR       >80% decrease in PCT            Discourage initiation of                                            antibiotics      Encourage discontinuation           of antibiotics    ----------------------------     -----------------------------         PCT >= 0.50 ng/mL              PCT 0.26 - 0.50 ng/mL               AND        <80% decrease in PCT             Encourage initiation of                                             antibiotics       Encourage continuation           of antibiotics    ----------------------------     -----------------------------        PCT >= 0.50 ng/mL                  PCT > 0.50 ng/mL               AND         increase in PCT                  Strongly encourage                                      initiation of antibiotics    Strongly encourage escalation           of antibiotics                                     -----------------------------                                           PCT <= 0.25 ng/mL  OR                                        > 80% decrease in PCT                                     Discontinue / Do not initiate                                             antibiotics Performed at [facility], [address] [place], [place] [address]   Lactic acid, plasma     Status: Abnormal   Collection Time: 04/28/18  3:23 AM  Result Value Ref Range   Lactic Acid, Venous 2.8 (HH) 0.5 - 1.9 mmol/L    Comment: CRITICAL RESULT CALLED TO, READ BACK BY AND VERIFIED WITH  [facility] AT 4431 04/28/18 SDR Performed at [facility], [address] [place], [place] [address]    Ct Abdomen Pelvis Wo Contrast  Result Date: 04/28/2018 CLINICAL DATA:  Abdominal distension and lower abdominal pain EXAM: CT ABDOMEN AND PELVIS WITHOUT CONTRAST TECHNIQUE: Multidetector CT imaging of the abdomen and pelvis was performed following the standard  protocol without IV contrast. COMPARISON:  CT 07/29/2017 FINDINGS: Lower chest: Lung bases demonstrate scattered lung nodules, many of which are calcified. No acute consolidation or effusion. Coronary vascular calcification. Cardiomegaly. Calcified hilar and mediastinal lymph nodes. Hepatobiliary: Numerous stones within the gallbladder. No focal hepatic abnormality or biliary dilatation Pancreas: Unremarkable. No pancreatic ductal dilatation or surrounding inflammatory changes. Spleen: Normal in size without focal abnormality. Adrenals/Urinary Tract: Adrenal glands are normal. Probable cysts within the left kidney. Hyperdense lesion mid to lower left kidney, possible hemorrhagic or proteinaceous cyst. Renal vascular calcification. Slight enlargement of the renal pelvises with prominent ureters bilaterally. Foley catheter in the bladder. Bladder appears thick walled and there is hazy edema within the pelvis around the bladder. Increased density within the bladder around the Foley catheter, may reflect stones within the bladder. Stomach/Bowel: Stomach is within normal limits. Appendix appears normal. No evidence of bowel wall thickening, distention, or inflammatory changes. Vascular/Lymphatic: Severe aortic atherosclerosis. No aneurysm. No significantly enlarged lymph nodes. Reproductive: Enlarged prostate gland Other: Negative for free air or free fluid. Musculoskeletal: Degenerative changes without acute or suspicious abnormality. IMPRESSION: 1. Prominent renal pelvises with dilated ureters bilaterally. Foley catheter in the decompressed bladder. Bladder appears thick walled and there is edema/soft tissue stranding in the pelvis, suggesting possible cystitis. No ureteral stones. High density within the decompressed urinary bladder around the Foley catheter balloon, may reflect bladder stones. 2. Numerous gallstones 3. Cardiomegaly.  Evidence of prior granulomatous disease. 4. Enlarged prostate gland Electronically  Signed   By: Donavan Foil M.D.   On: 04/28/2018 03:46   Dg Chest Portable 1 View  Result Date: 04/28/2018 CLINICAL DATA:  Shortness of breath EXAM: PORTABLE CHEST 1 VIEW COMPARISON:  07/29/2017 FINDINGS: Linear scarring or atelectasis at the right base. Mild cardiomegaly. Calcified hilar lymph nodes. No pleural effusion or pneumothorax. IMPRESSION: 1. Subsegmental atelectasis or scar at the right base 2. Mild cardiomegaly Electronically Signed   By: Donavan Foil M.D.   On: 04/28/2018 02:59    Pending Labs  Unresulted Labs (From admission, onward)    Start     Ordered   04/29/18 6979  Basic metabolic panel  Tomorrow morning,   STAT     04/28/18 0526   04/29/18 0500  CBC  Tomorrow morning,   STAT     04/28/18 0526   04/28/18 0800  Lactic acid, plasma  Once-Timed,   STAT     04/28/18 0539   04/28/18 0253  Blood Culture (routine x 2)  BLOOD CULTURE X 2,   STAT     04/28/18 0255   04/28/18 0215  Urine Culture  Add-on,   AD    Question:  Patient immune status  Answer:  Normal   04/28/18 0214          Vitals/Pain Today's Vitals   04/28/18 0330 04/28/18 0400 04/28/18 0430 04/28/18 0530  BP:  (!) 158/80 (!) 149/79 (!) 170/79  Pulse: 83 74 71 70  Resp: 14 20 14 19   Temp:      TempSrc:      SpO2: 94% 100% 100% 100%  Weight:      Height:      PainSc:        Isolation Precautions No active isolations  Medications Medications  heparin injection 5,000 Units (has no administration in time range)  acetaminophen (TYLENOL) tablet 650 mg (has no administration in time range)    Or  acetaminophen (TYLENOL) suppository 650 mg (has no administration in time range)  polyethylene glycol (MIRALAX / GLYCOLAX) packet 17 g (has no administration in time range)  ondansetron (ZOFRAN) tablet 4 mg (has no administration in time range)    Or  ondansetron (ZOFRAN) injection 4 mg (has no administration in time range)  albuterol (PROVENTIL) (2.5 MG/3ML) 0.083% nebulizer solution 2.5 mg (has no  administration in time range)  0.9 %  sodium chloride infusion (has no administration in time range)  sodium chloride 0.9 % bolus 1,000 mL (has no administration in time range)  cefTRIAXone (ROCEPHIN) 1 g in sodium chloride 0.9 % 100 mL IVPB (has no administration in time range)  sodium chloride 0.9 % bolus 1,000 mL (0 mLs Intravenous Stopped 04/28/18 0452)  ceFEPIme (MAXIPIME) 2 g in sodium chloride 0.9 % 100 mL IVPB (0 g Intravenous Stopped 04/28/18 0452)    Mobility walks with device / walker / needs PT

## 2018-04-28 NOTE — Evaluation (Signed)
Physical Therapy Evaluation Patient Details Name: Troy Berry. MRN: [id number] DOB: 01/23/1946 Today's Date: 04/28/2018   History of Present Illness  Pt is a 73 year old male admitted with a UTI.  PMH includes CKD 4, Htn, chronic catheterization and recurrent UTI's.  Clinical Impression  Pt lethargic initially when PT entered room but oriented to situation and place.  He was able to perform bed mobility mod I and sit at EOB with good balance.  He presented with fair strength of UE/LE and reported no sensation loss.  Pt able to stand from bedside with use of RW, CGA.  Pt able to ambulate 30 ft in room with RW with mild gait deviations indicative of fall risk.  Pt demonstrated mild balance deficits with static balance functional activity.  He was able to complete all seated there ex with some manual assist for correct form and initiation of movement.  Pt will continue to benefit from skilled PT with focus on strength, tolerance to activity and balance and fall prevention.    Follow Up Recommendations Home health PT;Supervision - Intermittent    Equipment Recommendations  None recommended by PT    Recommendations for Other Services       Precautions / Restrictions Precautions Precautions: Fall Restrictions Weight Bearing Restrictions: No      Mobility  Bed Mobility Overal bed mobility: Modified Independent             General bed mobility comments: Increased time  Transfers Overall transfer level: Needs assistance Equipment used: Rolling walker (2 wheeled) Transfers: Sit to/from Stand Sit to Stand: Min guard         General transfer comment: Able to rise from bedside without assistance.  Ambulation/Gait Ambulation/Gait assistance: Supervision Gait Distance (Feet): 30 Feet Assistive device: Rolling walker (2 wheeled)     Gait velocity interpretation: <1.8 ft/sec, indicate of risk for recurrent falls General Gait Details: Low foot clearance and step length,  slow gait possibly due to pt lethargy.  Stairs            Wheelchair Mobility    Modified Rankin (Stroke Patients Only)       Balance Overall balance assessment: Needs assistance Sitting-balance support: Feet supported Sitting balance-[name]: Good     Standing balance support: Bilateral upper extremity supported Standing balance-[name]: Fair Standing balance comment: Unilateral UE assist to simulate opening cabinet door, unilateral UE assist to bend to lift object from floor.                             Pertinent Vitals/Pain Pain Assessment: No/denies pain    Home Living Family/patient expects to be discharged to:: Private residence Living Arrangements: Spouse/significant other;Children Available Help at Discharge: Family;Available 24 hours/day Type of Home: House Home Access: Stairs to enter Entrance Stairs-Rails: Can reach both Entrance Stairs-Number of Steps: 5 Home Layout: One level Home Equipment: Walker - 2 wheels      Prior Function Level of Independence: Independent with assistive device(s)         Comments: Reports mostly household ambulation     Hand Dominance        Extremity/Trunk Assessment   Upper Extremity Assessment Upper Extremity Assessment: Generalized weakness(Grossly 4-/5 bilaterally.)    Lower Extremity Assessment Lower Extremity Assessment: Generalized weakness(Grossly 4-/5 bilaterally.)    Cervical / Trunk Assessment Cervical / Trunk Assessment: Kyphotic  Communication   Communication: HOH  Cognition Arousal/Alertness: Lethargic Behavior During Therapy:  WFL for tasks assessed/performed Overall Cognitive Status: Within Functional Limits for tasks assessed                                 General Comments: Pt asleep upon arrival and gradually became more responses as session progressed.      General Comments      Exercises General Exercises - Lower Extremity Ankle Circles/Pumps:  20 reps;Both;Supine;Strengthening Quad Sets: 10 reps;Both;Strengthening;Supine Heel Slides: Strengthening;Both;10 reps;Supine Hip ABduction/ADduction: Strengthening;Both;10 reps;Supine Straight Leg Raises: Strengthening;Both;10 reps;Supine   Assessment/Plan    PT Assessment Patient needs continued PT services  PT Problem List Decreased strength;Decreased mobility;Decreased balance;Decreased knowledge of use of DME;Decreased activity tolerance       PT Treatment Interventions DME instruction;Balance training;Functional mobility training;Patient/family education;Gait training;Therapeutic activities;Therapeutic exercise;Stair training    PT Goals (Current goals can be found in the Care Plan section)  Acute Rehab PT Goals Patient Stated Goal: To return to general daily activity and to regain strength. PT Goal Formulation: With patient Time For Goal Achievement: 05/12/18 Potential to Achieve Goals: Good    Frequency Min 2X/week   Barriers to discharge        Co-evaluation               AM-PAC PT "6 Clicks" Mobility  Outcome Measure Help needed turning from your back to your side while in a flat bed without using bedrails?: None Help needed moving from lying on your back to sitting on the side of a flat bed without using bedrails?: A Little Help needed moving to and from a bed to a chair (including a wheelchair)?: A Little Help needed standing up from a chair using your arms (e.g., wheelchair or bedside chair)?: A Little Help needed to walk in hospital room?: A Little Help needed climbing 3-5 steps with a railing? : A Little 6 Click Score: 19    End of Session Equipment Utilized During Treatment: Gait belt Activity Tolerance: Patient tolerated treatment well Patient left: in chair;with call bell/phone within reach;with family/visitor present;with chair alarm set Nurse Communication: Mobility status PT Visit Diagnosis: Unsteadiness on feet (R26.81);Muscle weakness  (generalized) (M62.81)    Time: 2706-2376 PT Time Calculation (min) (ACUTE ONLY): 25 min   Charges:   PT Evaluation $PT Eval Low Complexity: 1 Low PT Treatments $Therapeutic Exercise: 8-22 mins       Roxanne Gates, PT, DPT   Roxanne Gates 04/28/2018, 3:10 PM

## 2018-04-28 NOTE — ED Triage Notes (Signed)
Patient to ER for c/o lower abd pain today. Patient reports having BM today after taking laxative on 1/21 at 0900. Reports pain with BM and since BM. Patient has urinary catheter in place with leg bag, states he hasn't emptied urine since 1/21 at 0900 as well. Per EMS, patient had c/o shortness of breath earlier today, was laying down upon their arrival to home with O2 sat of 100% on RA.

## 2018-04-28 NOTE — ED Notes (Signed)
Patient transported to CT 

## 2018-04-29 DIAGNOSIS — N390 Urinary tract infection, site not specified: Secondary | ICD-10-CM

## 2018-04-29 LAB — CBC
HCT: 31.3 % — ABNORMAL LOW (ref 39.0–52.0)
Hemoglobin: 10.2 g/dL — ABNORMAL LOW (ref 13.0–17.0)
MCH: 28.3 pg (ref 26.0–34.0)
MCHC: 32.6 g/dL (ref 30.0–36.0)
MCV: 86.9 fL (ref 80.0–100.0)
Platelets: 186 10*3/uL (ref 150–400)
RBC: 3.6 MIL/uL — ABNORMAL LOW (ref 4.22–5.81)
RDW: 16.1 % — ABNORMAL HIGH (ref 11.5–15.5)
WBC: 5.1 10*3/uL (ref 4.0–10.5)
nRBC: 0 % (ref 0.0–0.2)

## 2018-04-29 LAB — BASIC METABOLIC PANEL
Anion gap: 7 (ref 5–15)
BUN: 35 mg/dL — ABNORMAL HIGH (ref 8–23)
CO2: 23 mmol/L (ref 22–32)
Calcium: 9.4 mg/dL (ref 8.9–10.3)
Chloride: 108 mmol/L (ref 98–111)
Creatinine, Ser: 2.04 mg/dL — ABNORMAL HIGH (ref 0.61–1.24)
GFR calc Af Amer: 36 mL/min — ABNORMAL LOW (ref >60)
GFR calc non Af Amer: 31 mL/min — ABNORMAL LOW (ref >60)
Glucose, Bld: 74 mg/dL (ref 70–99)
Potassium: 3.6 mmol/L (ref 3.5–5.1)
Sodium: 138 mmol/L (ref 135–145)

## 2018-04-29 MED ORDER — CEFDINIR 300 MG PO CAPS
300.0000 mg | ORAL_CAPSULE | Freq: Two times a day (BID) | ORAL | Status: DC
  Administered 2018-04-30: 300 mg via ORAL
  Filled 2018-04-29: qty 1

## 2018-04-29 MED ORDER — ENSURE ENLIVE PO LIQD
237.0000 mL | ORAL | Status: DC
  Administered 2018-04-29: 17:00:00 237 mL via ORAL

## 2018-04-29 MED ORDER — ADULT MULTIVITAMIN W/MINERALS CH
1.0000 | ORAL_TABLET | Freq: Every day | ORAL | Status: DC
  Administered 2018-04-30: 09:00:00 1 via ORAL
  Filled 2018-04-29: qty 1

## 2018-04-29 NOTE — Progress Notes (Signed)
[place] at [place] NAME: Troy Berry    MR#:  [id number]  DATE OF BIRTH:  1945-07-18  SUBJECTIVE:  CHIEF COMPLAINT:   Chief Complaint  Patient presents with  . Abdominal Pain   Brought with altered mental status and obstructed chronic Foley catheter with UTI. Foley catheter changed.  Much improved today.  REVIEW OF SYSTEMS:  CONSTITUTIONAL: No fever, have fatigue or weakness.  EYES: No blurred or double vision.  EARS, NOSE, AND THROAT: No tinnitus or ear pain.  RESPIRATORY: No cough, shortness of breath, wheezing or hemoptysis.  CARDIOVASCULAR: No chest pain, orthopnea, edema.  GASTROINTESTINAL: No nausea, vomiting, diarrhea or abdominal pain.  GENITOURINARY: No dysuria, hematuria.  ENDOCRINE: No polyuria, nocturia,  HEMATOLOGY: No anemia, easy bruising or bleeding SKIN: No rash or lesion. MUSCULOSKELETAL: No joint pain or arthritis.   NEUROLOGIC: No tingling, numbness, weakness.  PSYCHIATRY: No anxiety or depression.   ROS  DRUG ALLERGIES:   Allergies  Allergen Reactions  . Ace Inhibitors     Other reaction(s): Other (See Comments) lip swelling    VITALS:  Blood pressure 123/74, pulse 78, temperature 98.4 F (36.9 C), temperature source Oral, resp. rate 16, height 5\' 11"  (1.803 m), weight 71.9 kg, SpO2 100 %.  PHYSICAL EXAMINATION:  GENERAL:  73 y.o.-year-old patient lying in the bed with no acute distress.  EYES: Pupils equal, round, reactive to light and accommodation. No scleral icterus. Extraocular muscles intact.  HEENT: Head atraumatic, normocephalic. Oropharynx and nasopharynx clear.  NECK:  Supple, no jugular venous distention. No thyroid enlargement, no tenderness.  LUNGS: Normal breath sounds bilaterally, no wheezing, rales,rhonchi or crepitation. No use of accessory muscles of respiration.  CARDIOVASCULAR: S1, S2 normal. No murmurs, rubs, or gallops.  ABDOMEN: Soft, nontender, nondistended. Bowel sounds  present. No organomegaly or mass.  Foley catheter in place. EXTREMITIES: No pedal edema, cyanosis, or clubbing.  NEUROLOGIC: Cranial nerves II through XII are intact. Muscle strength 3-4/5 in all extremities. Sensation intact. Gait not checked.  PSYCHIATRIC: The patient is alert and oriented x 3.  SKIN: No obvious rash, lesion, or ulcer.   Physical Exam LABORATORY PANEL:   CBC Recent Labs  Lab 04/29/18 0514  WBC 5.1  HGB 10.2*  HCT 31.3*  PLT 186   ------------------------------------------------------------------------------------------------------------------  Chemistries  Recent Labs  Lab 04/28/18 0211 04/29/18 0514  NA 135 138  K 4.1 3.6  CL 102 108  CO2 20* 23  GLUCOSE 139* 74  BUN 42* 35*  CREATININE 2.66* 2.04*  CALCIUM 11.1* 9.4  AST 22  --   ALT 12  --   ALKPHOS 50  --   BILITOT 0.7  --    ------------------------------------------------------------------------------------------------------------------  Cardiac Enzymes Recent Labs  Lab 04/28/18 0750 04/28/18 2149  TROPONINI 0.09* 0.07*   ------------------------------------------------------------------------------------------------------------------  RADIOLOGY:  Ct Abdomen Pelvis Wo Contrast  Result Date: 04/28/2018 CLINICAL DATA:  Abdominal distension and lower abdominal pain EXAM: CT ABDOMEN AND PELVIS WITHOUT CONTRAST TECHNIQUE: Multidetector CT imaging of the abdomen and pelvis was performed following the standard protocol without IV contrast. COMPARISON:  CT 07/29/2017 FINDINGS: Lower chest: Lung bases demonstrate scattered lung nodules, many of which are calcified. No acute consolidation or effusion. Coronary vascular calcification. Cardiomegaly. Calcified hilar and mediastinal lymph nodes. Hepatobiliary: Numerous stones within the gallbladder. No focal hepatic abnormality or biliary dilatation Pancreas: Unremarkable. No pancreatic ductal dilatation or surrounding inflammatory changes. Spleen:  Normal in size without focal abnormality. Adrenals/Urinary Tract: Adrenal glands are normal.  Probable cysts within the left kidney. Hyperdense lesion mid to lower left kidney, possible hemorrhagic or proteinaceous cyst. Renal vascular calcification. Slight enlargement of the renal pelvises with prominent ureters bilaterally. Foley catheter in the bladder. Bladder appears thick walled and there is hazy edema within the pelvis around the bladder. Increased density within the bladder around the Foley catheter, may reflect stones within the bladder. Stomach/Bowel: Stomach is within normal limits. Appendix appears normal. No evidence of bowel wall thickening, distention, or inflammatory changes. Vascular/Lymphatic: Severe aortic atherosclerosis. No aneurysm. No significantly enlarged lymph nodes. Reproductive: Enlarged prostate gland Other: Negative for free air or free fluid. Musculoskeletal: Degenerative changes without acute or suspicious abnormality. IMPRESSION: 1. Prominent renal pelvises with dilated ureters bilaterally. Foley catheter in the decompressed bladder. Bladder appears thick walled and there is edema/soft tissue stranding in the pelvis, suggesting possible cystitis. No ureteral stones. High density within the decompressed urinary bladder around the Foley catheter balloon, may reflect bladder stones. 2. Numerous gallstones 3. Cardiomegaly.  Evidence of prior granulomatous disease. 4. Enlarged prostate gland Electronically Signed   By: Donavan Foil M.D.   On: 04/28/2018 03:46   Dg Chest Portable 1 View  Result Date: 04/28/2018 CLINICAL DATA:  Shortness of breath EXAM: PORTABLE CHEST 1 VIEW COMPARISON:  07/29/2017 FINDINGS: Linear scarring or atelectasis at the right base. Mild cardiomegaly. Calcified hilar lymph nodes. No pleural effusion or pneumothorax. IMPRESSION: 1. Subsegmental atelectasis or scar at the right base 2. Mild cardiomegaly Electronically Signed   By: Donavan Foil M.D.   On:  04/28/2018 02:59    ASSESSMENT AND PLAN:   Active Problems:   ARF (acute renal failure) (HCC)   Acute lower UTI  *Catheter related UTI with severe sepsis and acute encephalopathy On IV ceftriaxone. Urine culture growing bacteria but awaited sensitivity, blood cultures are negative. Elevated lactic acid. Given bolus of normal saline. Continue maintenance IV fluids. Monitor input and output. Foley catheter changed. Lactic acid improved. Mental status much improved and back to normal now.  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  After changing the Foley catheter, kidney function improved and back to baseline.  *Hypertension. Continue amlodipine  *Mild elevation troponin secondary to demand ischemia in setting of CKD stage IV.  repeat troponin. No chest pain or EKG changes.  * DVT prophylaxis with heparin   All the records are reviewed and case discussed with Care Management/Social Workerr. Management plans discussed with the patient, family and they are in agreement.  CODE STATUS: Full code.  TOTAL TIME TAKING CARE OF THIS PATIENT: 35 minutes.  Patient's wife was present in the room during my visit.  Discussed with nurse and encourage patient for ambulating in the room.  POSSIBLE D/C IN 1-2 DAYS, DEPENDING ON CLINICAL CONDITION.   Vaughan Basta M.D on 04/29/2018   Between 7am to 6pm - Pager - [phone]  After 6pm go to [email or web address] - password [email or web address] [place] [facility]  Office  [phone]  CC: Primary care physician; [facility], [facility]  Note: This dictation was prepared with Dragon dictation along with smaller phrase technology. Any transcriptional errors that result from this process are unintentional.

## 2018-04-29 NOTE — Progress Notes (Deleted)
Patient refused to wait for wheelchair and walked out claiming ride was here followed him to front entrance to make sure he got down  there ok.

## 2018-04-29 NOTE — Progress Notes (Signed)
Nutrition Brief Note  Patient identified on the Malnutrition Screening Tool (MST) Report  73 year old male admitted with a UTI.  PMH includes CKD 4, HTN, chronic catheterization and recurrent UTI's.  Met with pt in room today. Pt reports good appetite and oral intake at baseline. Pt eating 75-100% of meals in hospital. Pt reports he does enjoy Ensure and he drinks this at home. Pt reports he used to weigh 180-190lbs but reports his weight has declined over the past two years since he retired. Per chart, pt's weight fluctuates from 149-169lbs; unsure if pt has had any significant wt loss. RD will add Ensure daily as pt with mild to moderate muscle depletions and with CKD IV.   Wt Readings from Last 15 Encounters:  04/29/18 71.9 kg  03/21/18 72.6 kg  02/26/18 68 kg  09/27/17 76.7 kg  08/21/17 76.8 kg  07/31/17 81 kg    Body mass index is 22.11 kg/m. Patient meets criteria for normal weight based on current BMI.   Current diet order is regular, patient is consuming approximately 100% of meals at this time. Labs and medications reviewed.   No nutrition interventions warranted at this time. If nutrition issues arise, please consult RD.   Koleen Distance MS, RD, LDN Pager #- [phone] Office#- [phone] After Hours Pager: [phone]

## 2018-04-30 LAB — BASIC METABOLIC PANEL
ANION GAP: 5 (ref 5–15)
BUN: 35 mg/dL — ABNORMAL HIGH (ref 8–23)
CO2: 24 mmol/L (ref 22–32)
Calcium: 9.5 mg/dL (ref 8.9–10.3)
Chloride: 109 mmol/L (ref 98–111)
Creatinine, Ser: 2.05 mg/dL — ABNORMAL HIGH (ref 0.61–1.24)
GFR calc Af Amer: 36 mL/min — ABNORMAL LOW (ref >60)
GFR calc non Af Amer: 31 mL/min — ABNORMAL LOW (ref >60)
Glucose, Bld: 83 mg/dL (ref 70–99)
Potassium: 3.9 mmol/L (ref 3.5–5.1)
Sodium: 138 mmol/L (ref 135–145)

## 2018-04-30 LAB — CBC
HCT: 28.4 % — ABNORMAL LOW (ref 39.0–52.0)
HEMOGLOBIN: 9.1 g/dL — AB (ref 13.0–17.0)
MCH: 27.2 pg (ref 26.0–34.0)
MCHC: 32 g/dL (ref 30.0–36.0)
MCV: 85 fL (ref 80.0–100.0)
Platelets: 170 10*3/uL (ref 150–400)
RBC: 3.34 MIL/uL — ABNORMAL LOW (ref 4.22–5.81)
RDW: 15.9 % — ABNORMAL HIGH (ref 11.5–15.5)
WBC: 5.8 10*3/uL (ref 4.0–10.5)
nRBC: 0 % (ref 0.0–0.2)

## 2018-04-30 MED ORDER — ACETAMINOPHEN 325 MG PO TABS: 650.0000 mg | ORAL_TABLET | Freq: Four times a day (QID) | ORAL | 0 refills | Status: DC | PRN

## 2018-04-30 MED ORDER — CEFPODOXIME PROXETIL 200 MG PO TABS
200.0000 mg | ORAL_TABLET | Freq: Two times a day (BID) | ORAL | Status: DC
  Filled 2018-04-30 (×4): qty 1

## 2018-04-30 MED ORDER — CEFPODOXIME PROXETIL 200 MG PO TABS: 200.0000 mg | ORAL_TABLET | Freq: Two times a day (BID) | ORAL | 0 refills | Status: AC

## 2018-04-30 MED ORDER — CEFPODOXIME PROXETIL 200 MG PO TABS
200.0000 mg | ORAL_TABLET | Freq: Two times a day (BID) | ORAL | Status: DC
  Filled 2018-04-30: qty 1

## 2018-04-30 NOTE — Discharge Summary (Signed)
[place] at [place] NAME: Troy Berry    MR#:  [id number]  DATE OF BIRTH:  15-Apr-1945  DATE OF ADMISSION:  04/28/2018 ADMITTING PHYSICIAN: Hillary Bow, MD  DATE OF DISCHARGE: 04/30/2018   PRIMARY CARE PHYSICIAN: [facility] [place]    ADMISSION DIAGNOSIS:  Delirium [R41.0] Elevated troponin I level [R79.89] Elevated lactic acid level [R79.89] Malfunction of Foley catheter, initial encounter ([place]) [T83.011A] Urinary tract infection associated with indwelling urethral catheter, initial encounter ([place]) [T83.511A, N39.0] Sepsis, due to unspecified organism, unspecified whether acute organ dysfunction present ([place]) [A41.9]  DISCHARGE DIAGNOSIS:  Active Problems:   ARF (acute renal failure) ([place])   Acute lower UTI   SECONDARY DIAGNOSIS:   Past Medical History:  Diagnosis Date  . Anemia   . CKD (chronic kidney disease), stage III ([place])   . Gout   . Hypertension   . Urinary retention    in-dwelling Foley since Nov 2019, sees Dr. Mayme Genta COURSE:   *Catheter related UTI with severe sepsis and acute encephalopathy On IV ceftriaxone. Urine culture growing bacteria [employer] , blood cultures are negative. Elevated lactic acid. Given bolus of normal saline. Continue maintenance IV fluids. Monitor input and output. Foley catheter changed. Lactic acid improved. Mental status much improved and back to normal now. Biotics changed to oral depending on the culture reports.  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  After changing the Foley catheter, kidney function improved and back to baseline.  *Hypertension. Continue amlodipine  *Mild elevation troponin secondary to demand ischemia in setting of CKD stage IV.  repeat troponin. No chest pain or EKG changes.  * DVT prophylaxis with heparin  DISCHARGE CONDITIONS:    Stable.  CONSULTS OBTAINED:    DRUG ALLERGIES:   Allergies  Allergen Reactions  . Ace Inhibitors     Other reaction(s): Other (See Comments) lip swelling    DISCHARGE MEDICATIONS:   Allergies as of 04/30/2018      Reactions   Ace Inhibitors    Other reaction(s): Other (See Comments) lip swelling      Medication List    TAKE these medications   acetaminophen 325 MG tablet Commonly known as:  TYLENOL Take 2 tablets (650 mg total) by mouth every 6 (six) hours as needed for mild pain (or Fever >/= 101).   allopurinol 300 MG tablet Commonly known as:  ZYLOPRIM Take 300 mg by mouth daily.   amLODipine 10 MG tablet Commonly known as:  NORVASC Take 10 mg by mouth daily.   aspirin EC 81 MG tablet Take 81 mg by mouth daily.   cefpodoxime 200 MG tablet Commonly known as:  VANTIN Take 1 tablet (200 mg total) by mouth every 12 (twelve) hours for 7 days.   lovastatin 40 MG tablet Commonly known as:  MEVACOR Take 1 tablet by mouth every evening.        DISCHARGE INSTRUCTIONS:   Follow with primary care physician in 1 week.  If you experience worsening of your admission symptoms, develop shortness of breath, life threatening emergency, suicidal or homicidal thoughts you must seek medical attention immediately by calling 911 or calling your MD immediately  if symptoms less severe.  You Must read complete instructions/literature along with all the possible adverse reactions/side effects for all the Medicines you take and that have been prescribed to you. Take any new Medicines after you have completely understood  and accept all the possible adverse reactions/side effects.   Please note  You were cared for by a hospitalist during your hospital stay. If you have any questions about your discharge medications or the care you received while you were in the hospital after you are discharged, you can call the unit and asked to speak with the hospitalist on call if the  hospitalist that took care of you is not available. Once you are discharged, your primary care physician will handle any further medical issues. Please note that NO REFILLS for any discharge medications will be authorized once you are discharged, as it is imperative that you return to your primary care physician (or establish a relationship with a primary care physician if you do not have one) for your aftercare needs so that they can reassess your need for medications and monitor your lab values.    Today   CHIEF COMPLAINT:   Chief Complaint  Patient presents with  . Abdominal Pain    HISTORY OF PRESENT ILLNESS:  Troy Berry  is a 73 y.o. male with a known history of hypertension, CKD stage IV, chronic indwelling Foley catheter, recurrent UTIs presents to the emergency room from home due to weakness, confusion and abdominal pain.  Patient was found to have a clogged Foley catheter with distended bladder.  Foley catheter was replaced with thick purulent urine.  Patient has been hypertensive but lactic acid significantly elevated at 4.9.  Found to have UTI.  Is being admitted to the hospitalist service. Patient is confused and unable to contribute much history.  He is awake alert and conversational.   VITAL SIGNS:  Blood pressure (!) 145/75, pulse 66, temperature 98.1 F (36.7 C), temperature source Oral, resp. rate 18, height 5\' 11"  (1.803 m), weight 70.6 kg, SpO2 100 %.  I/O:    Intake/Output Summary (Last 24 hours) at 04/30/2018 1326 Last data filed at 04/30/2018 1000 Gross per 24 hour  Intake 120 ml  Output 2025 ml  Net -1905 ml    PHYSICAL EXAMINATION:   GENERAL:  73 y.o.-year-old patient lying in the bed with no acute distress.  EYES: Pupils equal, round, reactive to light and accommodation. No scleral icterus. Extraocular muscles intact.  HEENT: Head atraumatic, normocephalic. Oropharynx and nasopharynx clear.  NECK:  Supple, no jugular venous distention. No thyroid  enlargement, no tenderness.  LUNGS: Normal breath sounds bilaterally, no wheezing, rales,rhonchi or crepitation. No use of accessory muscles of respiration.  CARDIOVASCULAR: S1, S2 normal. No murmurs, rubs, or gallops.  ABDOMEN: Soft, nontender, nondistended. Bowel sounds present. No organomegaly or mass.  Foley catheter in place. EXTREMITIES: No pedal edema, cyanosis, or clubbing.  NEUROLOGIC: Cranial nerves II through XII are intact. Muscle strength 3-4/5 in all extremities. Sensation intact. Gait not checked.  PSYCHIATRIC: The patient is alert and oriented x 3.  SKIN: No obvious rash, lesion, or ulcer.   DATA REVIEW:   CBC Recent Labs  Lab 04/30/18 0423  WBC 5.8  HGB 9.1*  HCT 28.4*  PLT 170    Chemistries  Recent Labs  Lab 04/28/18 0211  04/30/18 0423  NA 135   < > 138  K 4.1   < > 3.9  CL 102   < > 109  CO2 20*   < > 24  GLUCOSE 139*   < > 83  BUN 42*   < > 35*  CREATININE 2.66*   < > 2.05*  CALCIUM 11.1*   < > 9.5  AST 22  --   --  ALT 12  --   --   ALKPHOS 50  --   --   BILITOT 0.7  --   --    < > = values in this interval not displayed.    Cardiac Enzymes Recent Labs  Lab 04/28/18 2149  TROPONINI 0.07*    Microbiology Results  Results for orders placed or performed during the hospital encounter of 04/28/18  Urine Culture     Status: Abnormal (Preliminary result)   Collection Time: 04/28/18  2:11 AM  Result Value Ref Range Status   Specimen Description   Final    URINE, CATHETERIZED Performed at [facility], [address] [place], [place] [address]    Special Requests   Final    Normal Performed at [facility], [address] [place], [place] [address]    Culture (A)  Final    >=100,000 COLONIES/mL PROVIDENCIA RETTGERI CULTURE REINCUBATED FOR BETTER GROWTH Performed at [facility], [place] [address] [place], [place] [address]    Report Status PENDING  Incomplete   Organism ID, Bacteria PROVIDENCIA RETTGERI (A)   Final      Susceptibility   Providencia rettgeri - MIC*    AMPICILLIN RESISTANT Resistant     CEFAZOLIN >=64 RESISTANT Resistant     CEFTRIAXONE <=1 SENSITIVE Sensitive     CIPROFLOXACIN <=0.25 SENSITIVE Sensitive     GENTAMICIN <=1 SENSITIVE Sensitive     IMIPENEM 1 SENSITIVE Sensitive     NITROFURANTOIN 128 RESISTANT Resistant     TRIMETH/SULFA <=20 SENSITIVE Sensitive     AMPICILLIN/SULBACTAM <=2 SENSITIVE Sensitive     PIP/TAZO <=4 SENSITIVE Sensitive     * >=100,000 COLONIES/mL PROVIDENCIA RETTGERI  Blood Culture (routine x 2)     Status: None (Preliminary result)   Collection Time: 04/28/18  3:23 AM  Result Value Ref Range Status   Specimen Description BLOOD RIGHT AC  Final   Special Requests   Final    BOTTLES DRAWN AEROBIC AND ANAEROBIC Blood Culture results may not be optimal due to an inadequate volume of blood received in culture bottles   Culture   Final    NO GROWTH 2 DAYS Performed at [facility], [address], [place], [place] [address]    Report Status PENDING  Incomplete  Blood Culture (routine x 2)     Status: None (Preliminary result)   Collection Time: 04/28/18  3:23 AM  Result Value Ref Range Status   Specimen Description BLOOD LEFT AC  Final   Special Requests   Final    BOTTLES DRAWN AEROBIC AND ANAEROBIC Blood Culture results may not be optimal due to an excessive volume of blood received in culture bottles   Culture   Final    NO GROWTH 2 DAYS Performed at [facility], [address], [place], [place] [address]    Report Status PENDING  Incomplete    RADIOLOGY:  No results found.  EKG:   Orders placed or performed during the hospital encounter of 04/28/18  . EKG 12-Lead  . EKG 12-Lead  . ED EKG  . ED EKG      Management plans discussed with the patient, family and they are in agreement.  CODE STATUS: Full    Code Status Orders  (From admission, onward)         Start     Ordered   04/28/18 0526  Full code   Continuous     04/28/18 0526  Code Status History    Date Active Date Inactive Code Status Order ID Comments User Context   07/29/2017 1530 07/31/2017 1700 Full Code [id number]  Demetrios Loll, MD ED      TOTAL TIME TAKING CARE OF THIS PATIENT: 35 minutes.    Vaughan Basta M.D on 04/30/2018 at 1:26 PM  Between 7am to 6pm - Pager - [phone]  After 6pm go to [email or web address] - password [email or web address] [place] [facility]  Office  [phone]  CC: Primary care physician; [facility], [facility]   Note: This dictation was prepared with Dragon dictation along with smaller phrase technology. Any transcriptional errors that result from this process are unintentional.

## 2018-04-30 NOTE — Care Management Important Message (Signed)
Important Message  Patient Details  Name: Troy Berry. MRN: [id number] Date of Birth: November 27, 1945   Medicare Important Message Given:  Yes    [name] 04/30/2018, 11:45 AM

## 2018-04-30 NOTE — Progress Notes (Signed)
[facility]  Next of kin: Lucretia Field (daughter) [phone]  Encarnacion Slates [phone] (son-in-law)  If patient discharges after hours, please call [phone].   Marilynne Drivers [place] 04/30/2018, 2:00 PM

## 2018-04-30 NOTE — Care Management (Addendum)
Discharge to home today per Dr. Anselm Jungling. Home Health orders faxed to [facility]. Received telephone call back from [facility]. Unable to accept [employer]. [place], will update Floydene Flock, [facility] representative. Family will transport Shelbie Ammons RN MSN CCM Care Management [phone]

## 2018-05-03 LAB — CULTURE, BLOOD (ROUTINE X 2)
CULTURE: NO GROWTH
Culture: NO GROWTH

## 2018-05-04 LAB — URINE CULTURE: Special Requests: NORMAL

## 2018-05-13 ENCOUNTER — Ambulatory Visit (INDEPENDENT_AMBULATORY_CARE_PROVIDER_SITE_OTHER): Payer: Medicare HMO | Admitting: Family Medicine

## 2018-05-13 VITALS — BP 112/73 | HR 70

## 2018-05-13 DIAGNOSIS — R339 Retention of urine, unspecified: Secondary | ICD-10-CM

## 2018-05-13 NOTE — Progress Notes (Signed)
Cath Change/ Replacement  Patient is present today for a catheter change due to urinary retention.  20ml of water was removed from the balloon, a 16FR foley cath was removed with out difficulty.  Patient was cleaned and prepped in a sterile fashion with betadine and 2% lidocaine jelly was instilled into the urethra. A 16 FR foley cath was replaced into the bladder no complications were noted Urine return was noted 89ml and urine was yellow in color. The balloon was filled with 76ml of sterile water. A leg bag was attached for drainage.  A night bag was also given to the patient and patient was given instruction on how to change from one bag to another. Patient was given proper instruction on catheter care.    Preformed by: Elberta Leatherwood, CMA  Follow up: 1 month cath change

## 2018-06-10 ENCOUNTER — Ambulatory Visit (INDEPENDENT_AMBULATORY_CARE_PROVIDER_SITE_OTHER): Payer: Medicare HMO | Admitting: Family Medicine

## 2018-06-10 DIAGNOSIS — R339 Retention of urine, unspecified: Secondary | ICD-10-CM

## 2018-06-10 NOTE — Progress Notes (Signed)
Cath Change/ Replacement  Patient is present today for a catheter change due to urinary retention.  65ml of water was removed from the balloon, a 16FR foley cath was removed with out difficulty.  Patient was cleaned and prepped in a sterile fashion with betadine and 2% lidocaine jelly was instilled into the urethra. A 16 FR foley cath was replaced into the bladder no complications were noted Urine return was noted 66ml and urine was yellow in color. The balloon was filled with 21ml of sterile water. A night bag was attached for drainage. Patient was given proper instruction on catheter care.    Preformed by: Elberta Leatherwood, CMA  Follow up: 1 month

## 2018-07-13 ENCOUNTER — Telehealth: Payer: Self-pay

## 2018-07-13 ENCOUNTER — Emergency Department: Payer: Medicare HMO

## 2018-07-13 ENCOUNTER — Encounter: Payer: Self-pay | Admitting: Emergency Medicine

## 2018-07-13 ENCOUNTER — Ambulatory Visit (INDEPENDENT_AMBULATORY_CARE_PROVIDER_SITE_OTHER): Payer: Medicare HMO

## 2018-07-13 ENCOUNTER — Other Ambulatory Visit: Payer: Self-pay

## 2018-07-13 ENCOUNTER — Emergency Department
Admission: EM | Admit: 2018-07-13 | Discharge: 2018-07-13 | Disposition: A | Discharge status: Home / Self Care | Payer: Medicare HMO | Attending: Emergency Medicine | Admitting: Emergency Medicine

## 2018-07-13 VITALS — BP 147/87 | HR 71 | Ht 70.0 in | Wt 155.0 lb

## 2018-07-13 DIAGNOSIS — I129 Hypertensive chronic kidney disease with stage 1 through stage 4 chronic kidney disease, or unspecified chronic kidney disease: Secondary | ICD-10-CM | POA: Diagnosis not present

## 2018-07-13 DIAGNOSIS — Z79899 Other long term (current) drug therapy: Secondary | ICD-10-CM | POA: Insufficient documentation

## 2018-07-13 DIAGNOSIS — N3090 Cystitis, unspecified without hematuria: Secondary | ICD-10-CM | POA: Insufficient documentation

## 2018-07-13 DIAGNOSIS — K4090 Unilateral inguinal hernia, without obstruction or gangrene, not specified as recurrent: Secondary | ICD-10-CM | POA: Diagnosis not present

## 2018-07-13 DIAGNOSIS — N183 Chronic kidney disease, stage 3 (moderate): Secondary | ICD-10-CM | POA: Insufficient documentation

## 2018-07-13 DIAGNOSIS — Z87891 Personal history of nicotine dependence: Secondary | ICD-10-CM | POA: Diagnosis not present

## 2018-07-13 DIAGNOSIS — R1031 Right lower quadrant pain: Secondary | ICD-10-CM | POA: Diagnosis present

## 2018-07-13 DIAGNOSIS — Z960 Presence of urogenital implants: Secondary | ICD-10-CM | POA: Insufficient documentation

## 2018-07-13 DIAGNOSIS — R339 Retention of urine, unspecified: Secondary | ICD-10-CM | POA: Diagnosis not present

## 2018-07-13 DIAGNOSIS — Z7982 Long term (current) use of aspirin: Secondary | ICD-10-CM | POA: Diagnosis not present

## 2018-07-13 MED ORDER — CEPHALEXIN 250 MG PO CAPS: 250.0000 mg | ORAL_CAPSULE | Freq: Two times a day (BID) | ORAL | 0 refills | Status: AC

## 2018-07-13 NOTE — ED Notes (Signed)
Pt has foley cath. Denies knowing who his legal guardian is. Pt A&Ox2 (disoriented to year; said it was "2002" and place; said it was "[employer]").

## 2018-07-13 NOTE — Progress Notes (Signed)
Cath Change/ Replacement  Patient is present today for a catheter change due to urinary retention.  69ml of water was removed from the balloon, a 16FR foley cath was removed with out difficulty.  Patient was cleaned and prepped in a sterile fashion with betadine and 2% lidocaine jelly was instilled into the urethra. A 16 FR foley cath was replaced into the bladder no complications were noted Urine return was noted 76ml and urine was yellow in color. The balloon was filled with 97ml of sterile water. A leg bag was attached for drainage.  A night bag was also given to the patient and patient was given instruction on how to change from one bag to another. Patient was given proper instruction on catheter care.    Preformed by: Shawnie Dapper, CMA   Follow up: 1 month

## 2018-07-13 NOTE — Discharge Instructions (Addendum)
Please call the number for surgery to arrange a follow-up appointment to discuss your inguinal hernia and possible repair.  Please take your antibiotics as prescribed twice daily for the next 10 days and follow-up with your doctor regarding your bladder infection.

## 2018-07-13 NOTE — Telephone Encounter (Signed)
Called pt no answer. Left detailed message informing pt that if there is truly no urine flow into the leg bag then they will need to seek care in the ED as it is after 5pm and there is no provider on campus. Encouraged adequate hydration, also to make sure that catheter is not blocked or obstructed.

## 2018-07-13 NOTE — ED Notes (Signed)
Called wife Rue Tinnel) without answer at [phone] to try and find out who pt's guardian is to notify of visit to ER.

## 2018-07-13 NOTE — ED Notes (Addendum)
Pt states pain dec at the moment; wife in lobby and was given brief update by this RN; wife states she shares guardianship with pt's daughter; pt offered blankets but declines. Pt given pillow and tv remote.

## 2018-07-13 NOTE — ED Notes (Signed)
Attempted to contact Jannetta Quint (pt's wife) again at # in pt's demographics.

## 2018-07-13 NOTE — ED Triage Notes (Addendum)
Pt to triage via w/c with no distress noted, mask from home in place; pt reports painful knot to right groin noted today; denies any accomp symptoms; pt denies any hx of same or hx hernia, denies any injury

## 2018-07-13 NOTE — ED Notes (Signed)
Pt leaving for CT.  

## 2018-07-13 NOTE — ED Notes (Signed)
Pt signed d/c since unable to have visitors rn. Pt wheeled out to wife & grandson.

## 2018-07-13 NOTE — ED Provider Notes (Signed)
[facility] Emergency Department Provider Note  Time seen: 7:53 PM  I have reviewed the triage vital signs and the nursing notes.   HISTORY  Chief Complaint Groin Pain   HPI Troy Berry. is a 73 y.o. male with a past medical history of anemia, CKD, gout, hypertension, presents to the emergency department for right flank pain.  According to the patient over the past 1 month he has intermittently had pain and swelling in his right lower abdomen/right groin.  Patient states that time will have sharp pains in his right flank.  Denies any current constipation nausea vomiting or diarrhea.  No fever.  Minimal discomfort currently.  States sometimes he will have a large swelling to this area and sometimes it will go away.  States it has gone away currently.  Patient has a chronic indwelling Foley catheter.  Patient denies any scrotal swelling or testicular discomfort.  Patient denies any fever, cough congestion or recent travel.   Past Medical History:  Diagnosis Date  . Anemia   . CKD (chronic kidney disease), stage III ([place])   . Gout   . Hypertension   . Urinary retention    in-dwelling Foley since Nov 2019, sees Dr. Hollice Espy    Patient Active Problem List   Diagnosis Date Noted  . Acute lower UTI 04/29/2018  . AKI (acute kidney injury) ([place])   . Other hydronephrosis   . Urinary retention   . ARF (acute renal failure) ([place]) 07/29/2017    History reviewed. No pertinent surgical history.  Prior to Admission medications   Medication Sig Start Date End Date Taking? Authorizing Provider  acetaminophen (TYLENOL) 325 MG tablet Take 2 tablets (650 mg total) by mouth every 6 (six) hours as needed for mild pain (or Fever >/= 101). 04/30/18   Vaughan Basta, MD  allopurinol (ZYLOPRIM) 300 MG tablet Take 300 mg by mouth daily. 12/08/17   [provider]  amLODipine (NORVASC) 10 MG tablet Take 10 mg by mouth daily.    [provider]   aspirin EC 81 MG tablet Take 81 mg by mouth daily.    [provider]  lovastatin (MEVACOR) 40 MG tablet Take 1 tablet by mouth every evening. 07/16/17   [provider]    Allergies  Allergen Reactions  . Ace Inhibitors     Other reaction(s): Other (See Comments) lip swelling    Family History  Problem Relation Age of Onset  . Cancer Father     Social History Social History   Tobacco Use  . Smoking status: Former Smoker    Types: Cigarettes    Last attempt to quit: 04/07/1998    Years since quitting: 20.2  . Smokeless tobacco: Never Used  Substance Use Topics  . Alcohol use: Not Currently  . Drug use: Never    Review of Systems Constitutional: Negative for fever. ENT: Negative for recent illness/congestion Cardiovascular: Negative for chest pain. Respiratory: Negative for shortness of breath. Gastrointestinal: Intermittent right flank pain.  Negative for nausea vomiting diarrhea or constipation. Genitourinary: Negative for urinary compaints Musculoskeletal: Negative for musculoskeletal complaints Skin: Negative for skin complaints  Neurological: Negative for headache All other ROS negative  ____________________________________________   PHYSICAL EXAM:  VITAL SIGNS: ED Triage Vitals  Enc Vitals Group     BP 07/13/18 1935 113/74     Pulse Rate 07/13/18 1935 69     Resp 07/13/18 1935 18     Temp 07/13/18 1935 98.3 F (36.8  C)     Temp Source 07/13/18 1935 Oral     SpO2 07/13/18 1935 99 %     Weight 07/13/18 1936 150 lb (68 kg)     Height 07/13/18 1936 5\' 9"  (1.753 m)     Head Circumference --      Peak Flow --      Pain Score 07/13/18 1935 10     Pain Loc --      Pain Edu? --      Excl. in [place]? --    Constitutional: Alert and oriented. Well appearing and in no distress. Eyes: Normal exam ENT   Head: Normocephalic and atraumatic.   Mouth/Throat: Mucous membranes are moist. Cardiovascular: Normal rate, regular rhythm. No  murmur Respiratory: Normal respiratory effort without tachypnea nor retractions. Breath sounds are clear Gastrointestinal: Soft and nontender. No distention.  No signs of inguinal hernia on exam. Genitourinary: Normal external GU exam, Foley catheter present.  No scrotal swelling. Musculoskeletal: Nontender with normal range of motion in all extremities. Neurologic:  Normal speech and language. No gross focal neurologic deficits  Skin:  Skin is warm, dry and intact.  Psychiatric: Mood and affect are normal.   ____________________________________________   RADIOLOGY  IMPRESSION: 1. Bladder decompressed by Foley catheter. Diffuse bladder wall thickening and perivesicular edema, suspicious for cystitis. Chronic bladder outlet obstruction also considered. Suspected stones in the bladder adjacent to the Foley balloon as seen on prior exam. 2. Improved dilatation of bilateral ureters and renal collecting system since January. No current hydronephrosis. Thickening of the distal left ureter appears similar and may be infectious or inflammatory. 3. Bilateral nonobstructing renal stones versus vascular calcifications. 4. Cholelithiasis without gallbladder inflammation. 5. Colonic diverticulosis without diverticulitis. 6. Enlarged prostate gland. 7. Chronic cardiomegaly. Aortic Atherosclerosis (ICD10-I70.0).IMPRESSION:    INITIAL IMPRESSION / ASSESSMENT AND PLAN / ED COURSE  Pertinent labs & imaging results that were available during my care of the patient were reviewed by me and considered in my medical decision making (see chart for details).  Patient presents emergency department for right flank pain with occasional swelling in his right groin.  Differential would include intra-abdominal pathology such as appendicitis, ureterolithiasis, more likely inguinal hernia.  We will obtain the CT scan to further evaluate.  Patient has no swelling currently and denies any discomfort,  nontender.  CT scan shows bladder wall thickening concerning for possible edema or cystitis.  Patient has chronic Foley catheter with a decompressed bladder.  No other obvious acute abnormalities.  Patient has a chronic indwelling Foley catheter, urine would be grossly infected at baseline we will cover with antibiotics as a precaution have the patient follow-up with his doctor.  Regarding the swelling still highly suspect intermittent inguinal hernia.  No hernia present at this time, nontender to this area.  We will discharge with surgery follow-up to discuss repair in the future.  Patient agreeable to plan of care.  ____________________________________________   FINAL CLINICAL IMPRESSION(S) / ED DIAGNOSES  Right flank pain Cystitis   Harvest Dark, MD 07/13/18 2138

## 2018-07-28 ENCOUNTER — Encounter: Payer: Self-pay | Admitting: *Deleted

## 2018-08-05 ENCOUNTER — Other Ambulatory Visit: Payer: Self-pay

## 2018-08-05 ENCOUNTER — Encounter: Payer: Self-pay | Admitting: Surgery

## 2018-08-05 ENCOUNTER — Ambulatory Visit (INDEPENDENT_AMBULATORY_CARE_PROVIDER_SITE_OTHER): Payer: Medicare HMO | Admitting: Surgery

## 2018-08-05 VITALS — BP 153/85 | HR 61 | Temp 97.7°F | Resp 16 | Ht 71.0 in | Wt 150.0 lb

## 2018-08-05 DIAGNOSIS — R1909 Other intra-abdominal and pelvic swelling, mass and lump: Secondary | ICD-10-CM

## 2018-08-05 NOTE — Patient Instructions (Signed)
Please call our office if you have questions or concerns.   

## 2018-08-05 NOTE — Progress Notes (Signed)
Surgical Clinic History and Physical  Referring provider:  Frazier Richards, MD [place], [place] [id number]  HISTORY OF PRESENT ILLNESS (HPI):  73 y.o. minimally mobile male with a chronic indwelling Foley catheter presents for evaluation of a possible Right inguinal hernia. Patient's wife reports that she noted a Right groin bulge (demonstrated as medial to ASIS) 3 months ago. Patient states that he experiences intermittent burning with urination, though it is unclear whether he experiences Right groin or scrotal pain, and he is unable to describe whether bulge and pain have been present x months or x years. Patient recently presented to [facility] ED for Right flank burning. No RIH could be appreciated by ED physician at that time, and CT A/P was performed. Patient is referred accordingly to evaluate for Right inguinal hernia. Patient otherwise reports +flatus, denies recently frequent constipation, N/V, fever/chills, CP, or SOB.  PAST MEDICAL HISTORY (PMH):  Past Medical History:  Diagnosis Date  . Anemia   . CKD (chronic kidney disease), stage III ([place])   . Gout   . Hypertension   . Urinary retention    in-dwelling Foley since Nov 2019, sees Dr. Hollice Espy    PAST SURGICAL HISTORY [facility]):  History reviewed. No pertinent surgical history.   MEDICATIONS:  Prior to Admission medications   Medication Sig Start Date End Date Taking? Authorizing Provider  acetaminophen (TYLENOL) 325 MG tablet Take 2 tablets (650 mg total) by mouth every 6 (six) hours as needed for mild pain (or Fever >/= 101). 04/30/18  Yes Vaughan Basta, MD  allopurinol (ZYLOPRIM) 300 MG tablet Take 300 mg by mouth daily. 12/08/17  Yes [provider]  amLODipine (NORVASC) 10 MG tablet Take 10 mg by mouth daily.   Yes [provider]  aspirin EC 81 MG tablet Take 81 mg by mouth daily.   Yes [provider]  carvedilol (COREG) 25 MG tablet  05/11/18  Yes [provider]   cholecalciferol (VITAMIN D3) 25 MCG (1000 UT) tablet Take 1,000 Units by mouth daily.   Yes [provider]  lovastatin (MEVACOR) 40 MG tablet Take 1 tablet by mouth every evening. 07/16/17  Yes [provider]    ALLERGIES:  Allergies  Allergen Reactions  . Ace Inhibitors     Other reaction(s): Other (See Comments) lip swelling    SOCIAL HISTORY:  Social History   Socioeconomic History  . Marital status: Married    Spouse name: Not on file  . Number of children: Not on file  . Years of education: Not on file  . Highest education level: Not on file  Occupational History  . Not on file  Social Needs  . Financial resource strain: Not hard at all  . Food insecurity:    Worry: Never true    Inability: Never true  . Transportation needs:    Medical: No    Non-medical: No  Tobacco Use  . Smoking status: Former Smoker    Types: Cigarettes    Last attempt to quit: 04/07/1998    Years since quitting: 20.3  . Smokeless tobacco: Never Used  Substance and Sexual Activity  . Alcohol use: Not Currently  . Drug use: Never  . Sexual activity: Yes  Lifestyle  . Physical activity:    Days per week: 0 days    Minutes per session: 0 min  . Stress: Not at all  Relationships  . Social connections:    Talks on phone: Once a week  Gets together: Once a week    Attends religious service: Never    Active member of club or organization: No    Attends meetings of clubs or organizations: Never    Relationship status: Married  . Intimate partner violence:    Fear of current or ex partner: No    Emotionally abused: No    Physically abused: No    Forced sexual activity: No  Other Topics Concern  . Not on file  Social History Narrative   Lives at home with family    The patient currently resides (home / rehab facility / nursing home): Home The patient normally is (ambulatory / bedbound): Minimally ambulatory with assistance  FAMILY HISTORY:  Family History   Problem Relation Age of Onset  . Cancer Father     Otherwise negative/non-contributory.  REVIEW OF SYSTEMS:  Constitutional: denies any other weight loss, fever, chills, or sweats  Eyes: denies any other vision changes, history of eye injury  ENT: denies sore throat, hearing problems  Respiratory: denies shortness of breath, wheezing  Cardiovascular: denies chest pain, palpitations  Gastrointestinal: abdominal pain, N/V, and bowel function as per HPI Musculoskeletal: denies any other joint pains or cramps  Skin: Denies any other rashes or skin discolorations Neurological: denies any other headache, dizziness, weakness  Psychiatric: Denies any other depression, anxiety   All other review of systems were otherwise negative   VITAL SIGNS:  BP (!) 153/85   Pulse 61   Temp 97.7 F (36.5 C) (Temporal)   Resp 16   Ht 5\' 11"  (1.803 m)   Wt 150 lb (68 kg)   SpO2 98%   BMI 20.92 kg/m   PHYSICAL EXAM:  Constitutional:  -- Normal body habitus  -- Awake, alert, and oriented x3  Eyes:  -- Pupils equally round and reactive to light  -- No scleral icterus  Ear, nose, throat:  -- =No jugular venous distension -- No nasal drainage, bleeding Pulmonary:  -- No crackles  -- Equal breath sounds bilaterally -- Breathing non-labored at rest Cardiovascular:  -- S1, S2 present  -- No pericardial rubs  Gastrointestinal:  -- Abdomen soft, non-tender to palpation, non-distended, no guarding/rebound tenderness -- No abdominal masses appreciated, pulsatile or otherwise (specifically no inguinal hernia able to be appreciated on Right or Left side, nor with coughing, valsalva, or sitting upright from reclined position) Musculoskeletal and Integumentary:  -- Wounds or skin discoloration: None appreciated Genitourinary: -- Indwelling Foley catheter to leg back, patient wearing diaper -- Extremities: B/L UE and LE FROM, hands and feet warm, no edema  Neurologic:  -- Motor function: Intact and  symmetric -- Sensation: Intact and symmetric  Labs:  CBC Latest Ref Rng & Units 04/30/2018 04/29/2018 04/28/2018  WBC 4.0 - 10.5 K/uL 5.8 5.1 9.8  Hemoglobin 13.0 - 17.0 g/dL 9.1(L) 10.2(L) 11.7(L)  Hematocrit 39.0 - 52.0 % 28.4(L) 31.3(L) 35.2(L)  Platelets 150 - 400 K/uL 170 186 226   CMP Latest Ref Rng & Units 04/30/2018 04/29/2018 04/28/2018  Glucose 70 - 99 mg/dL 83 74 139(H)  BUN 8 - 23 mg/dL 35(H) 35(H) 42(H)  Creatinine 0.61 - 1.24 mg/dL 2.05(H) 2.04(H) 2.66(H)  Sodium 135 - 145 mmol/L 138 138 135  Potassium 3.5 - 5.1 mmol/L 3.9 3.6 4.1  Chloride 98 - 111 mmol/L 109 108 102  CO2 22 - 32 mmol/L 24 23 20(L)  Calcium 8.9 - 10.3 mg/dL 9.5 9.4 11.1(H)  Total Protein 6.5 - 8.1 g/dL - - 7.7  Total Bilirubin  0.3 - 1.2 mg/dL - - 0.7  Alkaline Phos 38 - 126 U/L - - 50  AST 15 - 41 U/L - - 22  ALT 0 - 44 U/L - - 12    Imaging studies:  CT Abdomen and Pelvis without Contrast (07/13/2018) - personally reviewed and discussed with patient 1. Bladder decompressed by Foley catheter. Diffuse bladder wall thickening and perivesicular edema, suspicious for cystitis. Chronic bladder outlet obstruction also considered. Suspected stones in the bladder adjacent to the Foley balloon as seen on prior exam. 2. Improved dilatation of bilateral ureters and renal collecting system since January. No current hydronephrosis. Thickening of the distal left ureter appears similar and may be infectious or inflammatory. 3. Bilateral nonobstructing renal stones versus vascular calcifications. 4. Cholelithiasis without gallbladder inflammation. 5. Colonic diverticulosis without diverticulitis. 6. Enlarged prostate gland. 7. Chronic cardiomegaly.  Aortic Atherosclerosis    CT Abdomen and Pelvis without Contrast (04/28/2018) - personally reviewed and discussed with patient 1. Prominent renal pelvises with dilated ureters bilaterally. Foley catheter in the decompressed bladder. Bladder appears thick walled and  there is edema/soft tissue stranding in the pelvis, suggesting possible cystitis. No ureteral stones. High density within the decompressed urinary bladder around the Foley catheter balloon, may reflect bladder stones. 2. Numerous gallstones 3. Cardiomegaly.  Evidence of prior granulomatous disease. 4. Enlarged prostate gland   CT Abdomen and Pelvis without Contrast (07/29/2017) - personally reviewed and discussed with patient Bilateral hydronephrosis. Urinary bladder is markedly distended into the lower abdomen. Prostate enlargement.  Cholelithiasis.  Gaseous distention of the right colon and transverse colon. This presumably is related to mild ileus. Colonic diverticulosis. No active diverticulitis.  Diffuse severe aortoiliac atherosclerosis and branch vessel atherosclerosis.  Trace bilateral effusions.  Diffuse severe coronary artery disease.  Old granulomatous disease.   Assessment/Plan:  73 y.o. male with no evidence of inguinal hernias (Right or Left) on exam or on radiographic imaging studies, complicated by co-morbidities including advanced chronological age, HTN, CKD (stage 3), chronic indwelling Foley catheter with cystitis, gout, and former chronic tobacco abuse (smoking).   - management of chronic indwelling Foley catheter as per urology  - discussed with patient differential diagnoses and signs/symptoms of inguinal hernia  - return to clinic as needed, instructed to call if any questions or concerns  All of the above recommendations were discussed with the patient and his wife, and all of patient's and family's questions were answered to their expressed satisfaction.  Thank you for the opportunity to participate in this patient's care.  -- Marilynne Drivers Rosana Hoes, MD, [place]: [facility] - Partnering for exceptional care. Office: [phone]

## 2018-08-12 ENCOUNTER — Ambulatory Visit (INDEPENDENT_AMBULATORY_CARE_PROVIDER_SITE_OTHER): Payer: Medicare HMO | Admitting: *Deleted

## 2018-08-12 ENCOUNTER — Other Ambulatory Visit: Payer: Self-pay

## 2018-08-12 DIAGNOSIS — R339 Retention of urine, unspecified: Secondary | ICD-10-CM | POA: Diagnosis not present

## 2018-08-12 NOTE — Progress Notes (Signed)
Cath Change/ Replacement  Patient is present today for a catheter change due to urinary retention.  48ml of water was removed from the balloon, a 16FR foley coude cath was removed with out difficulty.  Patient was cleaned and prepped in a sterile fashion with betadine and 2% lidocaine jelly was instilled into the urethra. A 16 FR foley coude cath was replaced into the bladder complications were noted as: patient had a bladder spasm-was irrigated to confirm placement some light pink blood was noted. Urine return was noted and urine was yellow in color. The balloon was filled with 67ml of sterile water. A leg bag was attached for drainage.  A night bag was also given to the patient and patient was given instruction on how to change from one bag to another. Patient was given proper instruction on catheter care.    Preformed by: Verlene Mayer, CMA and Alva Garnet  Follow up: One month

## 2018-08-29 ENCOUNTER — Emergency Department: Payer: Medicare HMO

## 2018-08-29 ENCOUNTER — Other Ambulatory Visit: Payer: Self-pay

## 2018-08-29 ENCOUNTER — Emergency Department
Admission: EM | Admit: 2018-08-29 | Discharge: 2018-08-30 | Disposition: A | Discharge status: Home / Self Care | Payer: Medicare HMO | Attending: Emergency Medicine | Admitting: Emergency Medicine

## 2018-08-29 DIAGNOSIS — T83511A Infection and inflammatory reaction due to indwelling urethral catheter, initial encounter: Secondary | ICD-10-CM | POA: Diagnosis not present

## 2018-08-29 DIAGNOSIS — Z79899 Other long term (current) drug therapy: Secondary | ICD-10-CM | POA: Diagnosis not present

## 2018-08-29 DIAGNOSIS — I129 Hypertensive chronic kidney disease with stage 1 through stage 4 chronic kidney disease, or unspecified chronic kidney disease: Secondary | ICD-10-CM | POA: Insufficient documentation

## 2018-08-29 DIAGNOSIS — N183 Chronic kidney disease, stage 3 (moderate): Secondary | ICD-10-CM | POA: Insufficient documentation

## 2018-08-29 DIAGNOSIS — Y69 Unspecified misadventure during surgical and medical care: Secondary | ICD-10-CM | POA: Insufficient documentation

## 2018-08-29 DIAGNOSIS — K5900 Constipation, unspecified: Secondary | ICD-10-CM | POA: Insufficient documentation

## 2018-08-29 DIAGNOSIS — T83091A Other mechanical complication of indwelling urethral catheter, initial encounter: Secondary | ICD-10-CM

## 2018-08-29 DIAGNOSIS — Z87891 Personal history of nicotine dependence: Secondary | ICD-10-CM | POA: Diagnosis not present

## 2018-08-29 DIAGNOSIS — Z7982 Long term (current) use of aspirin: Secondary | ICD-10-CM | POA: Insufficient documentation

## 2018-08-29 DIAGNOSIS — N390 Urinary tract infection, site not specified: Secondary | ICD-10-CM

## 2018-08-29 LAB — CBC WITH DIFFERENTIAL/PLATELET
Abs Immature Granulocytes: 0.03 10*3/uL (ref 0.00–0.07)
Basophils Absolute: 0 10*3/uL (ref 0.0–0.1)
Basophils Relative: 0 %
Eosinophils Absolute: 0.2 10*3/uL (ref 0.0–0.5)
Eosinophils Relative: 2 %
HCT: 37.8 % — ABNORMAL LOW (ref 39.0–52.0)
Hemoglobin: 12.5 g/dL — ABNORMAL LOW (ref 13.0–17.0)
Immature Granulocytes: 0 %
Lymphocytes Relative: 9 %
Lymphs Abs: 0.8 10*3/uL (ref 0.7–4.0)
MCH: 27.7 pg (ref 26.0–34.0)
MCHC: 33.1 g/dL (ref 30.0–36.0)
MCV: 83.6 fL (ref 80.0–100.0)
Monocytes Absolute: 0.7 10*3/uL (ref 0.1–1.0)
Monocytes Relative: 7 %
Neutro Abs: 7.2 10*3/uL (ref 1.7–7.7)
Neutrophils Relative %: 82 %
Platelets: 196 10*3/uL (ref 150–400)
RBC: 4.52 MIL/uL (ref 4.22–5.81)
RDW: 14.6 % (ref 11.5–15.5)
WBC: 8.9 10*3/uL (ref 4.0–10.5)
nRBC: 0 % (ref 0.0–0.2)

## 2018-08-29 NOTE — ED Notes (Signed)
Pt brought to ED by wife and son in law; wife to stay with husband for a few minutes in waiting room until she can let the nurse know what's going on; son in law says pt "might have a touch of dementia"

## 2018-08-29 NOTE — ED Provider Notes (Signed)
[facility] Emergency Department Provider Note   ____________________________________________    I have reviewed the triage vital signs and the nursing notes.   HISTORY  Chief Complaint Constipation    HPI Troy Berry. is a 73 y.o. male who presents with complaints of constipation and Foley problem.  Patient reports that he does not think he has had a bowel movement in approximately 2 weeks.,  Feels bloated but denies abdominal pain.  Has not taken anything for this.  Also has not had to empty his Foley bag today, currently it is empty which is unusual for him.  Denies fevers or chills.  No myalgias.  Past Medical History:  Diagnosis Date  . Anemia   . CKD (chronic kidney disease), stage III ([place])   . Gout   . Hypertension   . Urinary retention    in-dwelling Foley since Nov 2019, sees Dr. Hollice Espy    Patient Active Problem List   Diagnosis Date Noted  . Acute lower UTI 04/29/2018  . AKI (acute kidney injury) ([place])   . Other hydronephrosis   . Urinary retention   . ARF (acute renal failure) ([place]) 07/29/2017  . Anemia 03/09/2014  . Chronic kidney disease 03/09/2014  . Essential hypertension 03/09/2014    No past surgical history on file.  Prior to Admission medications   Medication Sig Start Date End Date Taking? Authorizing Provider  acetaminophen (TYLENOL) 325 MG tablet Take 2 tablets (650 mg total) by mouth every 6 (six) hours as needed for mild pain (or Fever >/= 101). 04/30/18   Vaughan Basta, MD  allopurinol (ZYLOPRIM) 300 MG tablet Take 300 mg by mouth daily. 12/08/17   [provider]  amLODipine (NORVASC) 10 MG tablet Take 10 mg by mouth daily.    [provider]  aspirin EC 81 MG tablet Take 81 mg by mouth daily.    [provider]  carvedilol (COREG) 25 MG tablet  05/11/18   [provider]  cholecalciferol (VITAMIN D3) 25 MCG (1000 UT) tablet Take 1,000 Units by mouth daily.     [provider]  ciprofloxacin (CIPRO) 500 MG tablet Take 1 tablet (500 mg total) by mouth 2 (two) times daily for 10 days. 08/30/18 09/09/18  Lavonia Drafts, MD  docusate sodium (COLACE) 100 MG capsule Take 1 capsule (100 mg total) by mouth 2 (two) times daily. 08/30/18 08/30/19  Lavonia Drafts, MD  lovastatin (MEVACOR) 40 MG tablet Take 1 tablet by mouth every evening. 07/16/17   [provider]  polyethylene glycol (MIRALAX) 17 g packet Take 17 g by mouth daily. 08/30/18   Lavonia Drafts, MD     Allergies Ace inhibitors  Family History  Problem Relation Age of Onset  . Cancer Father     Social History Social History   Tobacco Use  . Smoking status: Former Smoker    Types: Cigarettes    Last attempt to quit: 04/07/1998    Years since quitting: 20.4  . Smokeless tobacco: Never Used  Substance Use Topics  . Alcohol use: Not Currently  . Drug use: Never    Review of Systems  Constitutional: No fever/chills Eyes: No visual changes.  ENT: No sore throat. Cardiovascular: Denies chest pain. Respiratory: Denies shortness of breath. Gastrointestinal: As above Genitourinary: As above Musculoskeletal: Negative for back pain. Skin: Negative for rash. Neurological: Negative for headaches or weakness   ____________________________________________   PHYSICAL EXAM:  VITAL SIGNS: ED Triage Vitals  Enc  Vitals Group     BP 08/29/18 2038 111/78     Pulse Rate 08/29/18 2038 (!) 111     Resp 08/29/18 2038 20     Temp 08/29/18 2038 97.9 F (36.6 C)     Temp Source 08/29/18 2038 Oral     SpO2 08/29/18 2038 96 %     Weight 08/29/18 2029 68 kg (150 lb)     Height 08/29/18 2029 1.803 m (5\' 11" )     Head Circumference --      Peak Flow --      Pain Score --      Pain Loc --      Pain Edu? --      Excl. in [place]? --     Constitutional: Alert and oriented.  Eyes: Conjunctivae are normal.   Nose: No congestion/rhinnorhea. Mouth/Throat: Mucous membranes are dry   Cardiovascular: Mild tachycardia, regular rhythm. Grossly normal heart sounds.  Good peripheral circulation. Respiratory: Normal respiratory effort.  No retractions.  Gastrointestinal: Soft and nontender. No distention.  No CVA tenderness. Genitourinary: Foley catheter in place, normal penis, no urine in bag at all, no significant suprapubic swelling, bladder scan unremarkable Musculoskeletal:  Warm and well perfused Neurologic:  Normal speech and language. No gross focal neurologic deficits are appreciated.  Skin:  Skin is warm, dry and intact. No rash noted. Psychiatric: Mood and affect are normal. Speech and behavior are normal.  ____________________________________________   LABS (all labs ordered are listed, but only abnormal results are displayed)  Labs Reviewed  CBC WITH DIFFERENTIAL/PLATELET - Abnormal; Notable for the following components:      Result Value   Hemoglobin 12.5 (*)    HCT 37.8 (*)    All other components within normal limits  COMPREHENSIVE METABOLIC PANEL - Abnormal; Notable for the following components:   Glucose, Bld 127 (*)    BUN 47 (*)    Creatinine, Ser 2.64 (*)    Calcium 11.4 (*)    Total Protein 8.4 (*)    GFR calc non Af Amer 23 (*)    GFR calc Af Amer 27 (*)    All other components within normal limits  URINALYSIS, COMPLETE (UACMP) WITH MICROSCOPIC - Abnormal; Notable for the following components:   Color, Urine YELLOW (*)    APPearance TURBID (*)    pH 9.0 (*)    Hgb urine dipstick MODERATE (*)    Protein, ur >=300 (*)    Nitrite POSITIVE (*)    Leukocytes,Ua LARGE (*)    RBC / HPF >50 (*)    WBC, UA >50 (*)    Bacteria, UA FEW (*)    All other components within normal limits  URINE CULTURE   ____________________________________________  EKG   ____________________________________________  RADIOLOGY  KUB demonstrates prominent stool volume, ____________________________________________   PROCEDURES  Procedure(s) performed: No   Procedures   Critical Care performed: No ____________________________________________   INITIAL IMPRESSION / ASSESSMENT AND PLAN / ED COURSE  Pertinent labs & imaging results that were available during my care of the patient were reviewed by me and considered in my medical decision making (see chart for details).  Patient presents with constipation, Foley catheter complication as noted above.  KUB does not demonstrate fecal impaction, he will require laxatives, stool softeners.  However the fact that he does not have any urine in his Foley catheter bag is somewhat concerning, he may have an obstruction in the catheter itself although bladder scan is not consistent with that.  We will obtain labs and check his kidney function and consider IV fluids  Patient given 500 cc bolus for mild dehydration, Rocephin for catheter associated urinary tract infection.  Catheter was blocked, replaced with good urine output.  Mag citrate given for constipation will start the patient on MiraLAX and Colace and Cipro as an outpatient, follow-up with PCP.  Return precautions discussed    ____________________________________________   FINAL CLINICAL IMPRESSION(S) / ED DIAGNOSES  Final diagnoses:  Obstruction of Foley catheter, initial encounter ([place])  Urinary tract infection associated with indwelling urethral catheter, initial encounter ([place])  Constipation, unspecified constipation type        Note:  This document was prepared using Dragon voice recognition software and may include unintentional dictation errors.   Lavonia Drafts, MD 08/30/18 Natasha Mead

## 2018-08-29 NOTE — ED Triage Notes (Addendum)
Patient with lower abdominal pain and pain at his penis.  Wife reports decreased urinary output in foley cath and that patient has not had a bowel movement since last week.

## 2018-08-30 LAB — URINALYSIS, COMPLETE (UACMP) WITH MICROSCOPIC
Bilirubin Urine: NEGATIVE
Glucose, UA: NEGATIVE mg/dL
Ketones, ur: NEGATIVE mg/dL
Nitrite: POSITIVE — AB
Protein, ur: >=300 mg/dL — AB
RBC / HPF: >50 RBC/hpf — ABNORMAL HIGH (ref 0–5)
Specific Gravity, Urine: 1.012 (ref 1.005–1.030)
Squamous Epithelial / HPF: NONE SEEN (ref 0–5)
WBC, UA: >50 WBC/hpf — ABNORMAL HIGH (ref 0–5)
pH: 9 — ABNORMAL HIGH (ref 5.0–8.0)

## 2018-08-30 LAB — COMPREHENSIVE METABOLIC PANEL
ALT: 11 U/L (ref 0–44)
AST: 20 U/L (ref 15–41)
Albumin: 4.5 g/dL (ref 3.5–5.0)
Alkaline Phosphatase: 48 U/L (ref 38–126)
Anion gap: 10 (ref 5–15)
BUN: 47 mg/dL — ABNORMAL HIGH (ref 8–23)
CO2: 23 mmol/L (ref 22–32)
Calcium: 11.4 mg/dL — ABNORMAL HIGH (ref 8.9–10.3)
Chloride: 103 mmol/L (ref 98–111)
Creatinine, Ser: 2.64 mg/dL — ABNORMAL HIGH (ref 0.61–1.24)
GFR calc Af Amer: 27 mL/min — ABNORMAL LOW (ref >60)
GFR calc non Af Amer: 23 mL/min — ABNORMAL LOW (ref >60)
Glucose, Bld: 127 mg/dL — ABNORMAL HIGH (ref 70–99)
Potassium: 4.1 mmol/L (ref 3.5–5.1)
Sodium: 136 mmol/L (ref 135–145)
Total Bilirubin: 0.5 mg/dL (ref 0.3–1.2)
Total Protein: 8.4 g/dL — ABNORMAL HIGH (ref 6.5–8.1)

## 2018-08-30 MED ORDER — DOCUSATE SODIUM 100 MG PO CAPS: 100.0000 mg | ORAL_CAPSULE | Freq: Two times a day (BID) | ORAL | 2 refills | Status: AC

## 2018-08-30 MED ORDER — SODIUM CHLORIDE 0.9 % IV BOLUS
500.0000 mL | Freq: Once | INTRAVENOUS | Status: AC
  Administered 2018-08-30: 500 mL via INTRAVENOUS

## 2018-08-30 MED ORDER — MAGNESIUM CITRATE PO SOLN
1.0000 | Freq: Once | ORAL | Status: AC
  Administered 2018-08-30: 02:00:00 1 via ORAL
  Filled 2018-08-30: qty 296

## 2018-08-30 MED ORDER — SODIUM CHLORIDE 0.9 % IV SOLN
1.0000 g | Freq: Once | INTRAVENOUS | Status: AC
  Administered 2018-08-30: 1 g via INTRAVENOUS
  Filled 2018-08-30: qty 10

## 2018-08-30 MED ORDER — POLYETHYLENE GLYCOL 3350 17 G PO PACK: 17.0000 g | PACK | Freq: Every day | ORAL | 0 refills | Status: DC

## 2018-08-30 MED ORDER — CIPROFLOXACIN HCL 500 MG PO TABS: 500.0000 mg | ORAL_TABLET | Freq: Two times a day (BID) | ORAL | 0 refills | Status: AC

## 2018-09-01 LAB — URINE CULTURE: Culture: >=100000 — AB

## 2018-09-09 ENCOUNTER — Ambulatory Visit: Payer: Medicare HMO

## 2018-09-16 ENCOUNTER — Other Ambulatory Visit: Payer: Self-pay

## 2018-09-16 ENCOUNTER — Ambulatory Visit (INDEPENDENT_AMBULATORY_CARE_PROVIDER_SITE_OTHER): Payer: Medicare HMO

## 2018-09-16 DIAGNOSIS — R339 Retention of urine, unspecified: Secondary | ICD-10-CM | POA: Diagnosis not present

## 2018-09-16 NOTE — Progress Notes (Signed)
Cath Change/ Replacement  Patient is present today for a catheter change due to urinary retention.  79ml of water was removed from the balloon, a 16 FR foley cath was removed with out difficulty.  Patient was cleaned and prepped in a sterile fashion with betadine and 2% lidocaine jelly was instilled into the urethra. A 16 coude FR foley cath was replaced into the bladder complications were noted as: patient experiencing pain at tip of penis, erosion noted at meatus along with a granulation tissue sensative to touch. Dr. Erlene Quan was brought in for exam and instructed to silver nitrate part of granulation tissue. This was done , patient tolerated well. Patient was instructed to keep catheter attached to leg with stat lock to help avoid more erosion. Stat lock was attached today Urine return was noted 55ml and urine was yellow in color. The balloon was filled with 41ml of sterile water. A night bag was attached for drainage. Patient was given proper instruction on catheter care.    Preformed by: Fonnie Jarvis, CMA  Follow up: 1 month cath exchange

## 2018-09-16 NOTE — Progress Notes (Signed)
Called patient's wife and left a message for her to call back to discuss today's instructions . Due to covid visitor precautions she was not allowed to accompany patient today

## 2018-10-18 ENCOUNTER — Ambulatory Visit: Payer: Medicare HMO

## 2018-10-18 ENCOUNTER — Ambulatory Visit (INDEPENDENT_AMBULATORY_CARE_PROVIDER_SITE_OTHER): Payer: Medicare HMO | Admitting: Family Medicine

## 2018-10-18 ENCOUNTER — Other Ambulatory Visit: Payer: Self-pay

## 2018-10-18 DIAGNOSIS — R339 Retention of urine, unspecified: Secondary | ICD-10-CM | POA: Diagnosis not present

## 2018-10-18 NOTE — Progress Notes (Signed)
Cath Change/ Replacement  Patient is present today for a catheter change due to urinary retention.  49ml of water was removed from the balloon, a 16FR foley cath was removed with out difficulty.  Patient was cleaned and prepped in a sterile fashion with betadine and 2% lidocaine jelly was instilled into the urethra. A 16 FR foley cath was replaced into the bladder no complications were noted Urine return was noted 27ml and urine was yellow in color. The balloon was filled with 72ml of sterile water.  A night bag was also given to the patient and patient was given instruction on how to change from one bag to another. Patient was given proper instruction on catheter care.    Preformed by: Elberta Leatherwood, CMA  Follow up: 1 month Cath change

## 2018-11-18 ENCOUNTER — Ambulatory Visit: Payer: Medicare HMO

## 2018-11-19 ENCOUNTER — Other Ambulatory Visit: Payer: Self-pay

## 2018-11-19 ENCOUNTER — Ambulatory Visit (INDEPENDENT_AMBULATORY_CARE_PROVIDER_SITE_OTHER): Payer: Medicare HMO

## 2018-11-19 DIAGNOSIS — R339 Retention of urine, unspecified: Secondary | ICD-10-CM

## 2018-11-19 NOTE — Progress Notes (Signed)
Cath Change/ Replacement  Patient is present today for a catheter change due to urinary retention.  32ml of water was removed from the balloon, a 16FR foley cath was removed with out difficulty.  Patient was cleaned and prepped in a sterile fashion with betadine and 2% lidocaine jelly was instilled into the urethra. A 16 FR foley cath was replaced into the bladder no complications were noted Urine return was noted 84ml and urine was clear yellow in color. The balloon was filled with 18ml of sterile water. A night bag was attached for drainage.  A night bag was also given to the patient and patient was given instruction on how to change from one bag to another. Patient was given proper instruction on catheter care.    Performed by: Fonnie Jarvis, CMA  Follow up: 1 month

## 2018-12-23 ENCOUNTER — Ambulatory Visit (INDEPENDENT_AMBULATORY_CARE_PROVIDER_SITE_OTHER): Payer: Medicare HMO | Admitting: *Deleted

## 2018-12-23 ENCOUNTER — Other Ambulatory Visit: Payer: Self-pay

## 2018-12-23 DIAGNOSIS — R339 Retention of urine, unspecified: Secondary | ICD-10-CM | POA: Diagnosis not present

## 2018-12-23 NOTE — Progress Notes (Signed)
Cath Change/ Replacement  Patient is present today for a catheter change due to urinary retention.  18ml of water was removed from the balloon, a 16FR foley cath was removed with out difficulty.  Patient was cleaned and prepped in a sterile fashion with betadine and 2% lidocaine jelly was instilled into the urethra. A 16 FR foley cath was replaced into the bladder no complications were noted Urine return was noted 5ml and urine was yellow in color. The balloon was filled with 11ml of sterile water. An overnight bag was attached for drainage.  A night bag was also given to the patient and patient was given instruction on how to change from one bag to another. Patient was given proper instruction on catheter care.    Performed JP:[id number] Troy Berry, CMA  Follow up: One month

## 2019-01-21 ENCOUNTER — Ambulatory Visit (INDEPENDENT_AMBULATORY_CARE_PROVIDER_SITE_OTHER): Payer: Medicare HMO

## 2019-01-21 ENCOUNTER — Other Ambulatory Visit: Payer: Self-pay

## 2019-01-21 DIAGNOSIS — R339 Retention of urine, unspecified: Secondary | ICD-10-CM

## 2019-01-21 NOTE — Progress Notes (Signed)
Cath Change/ Replacement  Patient is present today for a catheter change due to urinary retention.  64ml of water was removed from the balloon, a 16FR foley cath was removed with out difficulty.  Patient was cleaned and prepped in a sterile fashion with betadine and 2% lidocaine jelly was instilled into the urethra. A 16 FR foley cath was replaced into the bladder no complications were noted The balloon was filled with 39ml of sterile water. An overnight bag was attached for drainage.  A leg bag was also given to the patient and patient was given instruction on how to change from one bag to another. Patient was given proper instruction on catheter care.    Performed FM:[id number] Mell Guia CMA , Fonnie Jarvis CMA   Follow up: One month

## 2019-01-31 ENCOUNTER — Emergency Department: Payer: Medicare HMO

## 2019-01-31 ENCOUNTER — Emergency Department
Admission: EM | Admit: 2019-01-31 | Discharge: 2019-01-31 | Disposition: A | Discharge status: Home / Self Care | Payer: Medicare HMO | Attending: Emergency Medicine | Admitting: Emergency Medicine

## 2019-01-31 ENCOUNTER — Other Ambulatory Visit: Payer: Self-pay

## 2019-01-31 ENCOUNTER — Encounter: Payer: Self-pay | Admitting: Emergency Medicine

## 2019-01-31 DIAGNOSIS — T83098A Other mechanical complication of other indwelling urethral catheter, initial encounter: Secondary | ICD-10-CM | POA: Insufficient documentation

## 2019-01-31 DIAGNOSIS — N183 Chronic kidney disease, stage 3 unspecified: Secondary | ICD-10-CM | POA: Diagnosis not present

## 2019-01-31 DIAGNOSIS — T83091A Other mechanical complication of indwelling urethral catheter, initial encounter: Secondary | ICD-10-CM

## 2019-01-31 DIAGNOSIS — Z7982 Long term (current) use of aspirin: Secondary | ICD-10-CM | POA: Insufficient documentation

## 2019-01-31 DIAGNOSIS — Y733 Surgical instruments, materials and gastroenterology and urology devices (including sutures) associated with adverse incidents: Secondary | ICD-10-CM | POA: Insufficient documentation

## 2019-01-31 DIAGNOSIS — R1030 Lower abdominal pain, unspecified: Secondary | ICD-10-CM | POA: Diagnosis present

## 2019-01-31 DIAGNOSIS — Z87891 Personal history of nicotine dependence: Secondary | ICD-10-CM | POA: Diagnosis not present

## 2019-01-31 DIAGNOSIS — Z79899 Other long term (current) drug therapy: Secondary | ICD-10-CM | POA: Diagnosis not present

## 2019-01-31 DIAGNOSIS — I129 Hypertensive chronic kidney disease with stage 1 through stage 4 chronic kidney disease, or unspecified chronic kidney disease: Secondary | ICD-10-CM | POA: Insufficient documentation

## 2019-01-31 LAB — CBC
HCT: 35.8 % — ABNORMAL LOW (ref 39.0–52.0)
Hemoglobin: 12.1 g/dL — ABNORMAL LOW (ref 13.0–17.0)
MCH: 27.1 pg (ref 26.0–34.0)
MCHC: 33.8 g/dL (ref 30.0–36.0)
MCV: 80.1 fL (ref 80.0–100.0)
Platelets: 173 10*3/uL (ref 150–400)
RBC: 4.47 MIL/uL (ref 4.22–5.81)
RDW: 13.6 % (ref 11.5–15.5)
WBC: 8.3 10*3/uL (ref 4.0–10.5)
nRBC: 0 % (ref 0.0–0.2)

## 2019-01-31 LAB — COMPREHENSIVE METABOLIC PANEL
ALT: 11 U/L (ref 0–44)
AST: 17 U/L (ref 15–41)
Albumin: 4.1 g/dL (ref 3.5–5.0)
Alkaline Phosphatase: 46 U/L (ref 38–126)
Anion gap: 14 (ref 5–15)
BUN: 37 mg/dL — ABNORMAL HIGH (ref 8–23)
CO2: 21 mmol/L — ABNORMAL LOW (ref 22–32)
Calcium: 11.5 mg/dL — ABNORMAL HIGH (ref 8.9–10.3)
Chloride: 100 mmol/L (ref 98–111)
Creatinine, Ser: 2.76 mg/dL — ABNORMAL HIGH (ref 0.61–1.24)
GFR calc Af Amer: 25 mL/min — ABNORMAL LOW (ref >60)
GFR calc non Af Amer: 22 mL/min — ABNORMAL LOW (ref >60)
Glucose, Bld: 128 mg/dL — ABNORMAL HIGH (ref 70–99)
Potassium: 3.5 mmol/L (ref 3.5–5.1)
Sodium: 135 mmol/L (ref 135–145)
Total Bilirubin: 0.9 mg/dL (ref 0.3–1.2)
Total Protein: 7.7 g/dL (ref 6.5–8.1)

## 2019-01-31 LAB — URINALYSIS, COMPLETE (UACMP) WITH MICROSCOPIC
Bilirubin Urine: NEGATIVE
Glucose, UA: NEGATIVE mg/dL
Ketones, ur: NEGATIVE mg/dL
Nitrite: POSITIVE — AB
Protein, ur: 100 mg/dL — AB
RBC / HPF: >50 RBC/hpf — ABNORMAL HIGH (ref 0–5)
Specific Gravity, Urine: 1.008 (ref 1.005–1.030)
Squamous Epithelial / HPF: NONE SEEN (ref 0–5)
WBC, UA: >50 WBC/hpf — ABNORMAL HIGH (ref 0–5)
pH: 8 (ref 5.0–8.0)

## 2019-01-31 LAB — LIPASE, BLOOD: Lipase: 29 U/L (ref 11–51)

## 2019-01-31 NOTE — ED Triage Notes (Signed)
Pt presents to ED via [facility] with c/o pubic pain that started last night. Per [facility] pt has urinary catheter in. EMS reports changed approx 1 month ago, denies N/V. EMS reports no alteration in mental status, also reports per wife urinary catheter was placed to keep patient off dialysis.

## 2019-01-31 NOTE — ED Notes (Signed)
EDP at bedside at this time.  

## 2019-01-31 NOTE — ED Provider Notes (Signed)
[facility] Emergency Department Provider Note ____________________________________________   First MD Initiated Contact with Patient 01/31/19 1048     (approximate)  I have reviewed the triage vital signs and the nursing notes.   HISTORY  Chief Complaint Abdominal Pain  EM caveat, patient seems a somewhat poor historian.  HPI Waylyn Tenbrink. is a 73 y.o. male   here for evaluation of what he describes as difficulty with his urinary catheter and bladder tenderness  Patient has past medical history of chronic kidney disease, urinary retention.  Follows with Dr. Hollice Espy of urology  Patient reports he has a catheter, he needs is to keep his kidneys going so he does not end up with worsening kidneys.    Patient brought in EMS for concerns of suprapubic abdominal pain.  Does follow with clinic here for catheter exchanges from time to time.  Past Medical History:  Diagnosis Date  . Anemia   . CKD (chronic kidney disease), stage III   . Gout   . Hypertension   . Urinary retention    in-dwelling Foley since Nov 2019, sees Dr. Hollice Espy    Patient Active Problem List   Diagnosis Date Noted  . Acute lower UTI 04/29/2018  . AKI (acute kidney injury) ([place])   . Other hydronephrosis   . Urinary retention   . ARF (acute renal failure) ([place]) 07/29/2017  . Anemia 03/09/2014  . Chronic kidney disease 03/09/2014  . Essential hypertension 03/09/2014    History reviewed. No pertinent surgical history.  Prior to Admission medications   Medication Sig Start Date End Date Taking? Authorizing Provider  acetaminophen (TYLENOL) 325 MG tablet Take 2 tablets (650 mg total) by mouth every 6 (six) hours as needed for mild pain (or Fever >/= 101). 04/30/18   Vaughan Basta, MD  allopurinol (ZYLOPRIM) 300 MG tablet Take 300 mg by mouth daily. 12/08/17   [provider]  amLODipine (NORVASC) 10 MG tablet Take 10 mg by mouth daily.     [provider]  aspirin EC 81 MG tablet Take 81 mg by mouth daily.    [provider]  carvedilol (COREG) 25 MG tablet  05/11/18   [provider]  cholecalciferol (VITAMIN D3) 25 MCG (1000 UT) tablet Take 1,000 Units by mouth daily.    [provider]  docusate sodium (COLACE) 100 MG capsule Take 1 capsule (100 mg total) by mouth 2 (two) times daily. 08/30/18 08/30/19  Lavonia Drafts, MD  lovastatin (MEVACOR) 40 MG tablet Take 1 tablet by mouth every evening. 07/16/17   [provider]  polyethylene glycol (MIRALAX) 17 g packet Take 17 g by mouth daily. 08/30/18   Lavonia Drafts, MD    Allergies Ace inhibitors  Family History  Problem Relation Age of Onset  . Cancer Father     Social History Social History   Tobacco Use  . Smoking status: Former Smoker    Types: Cigarettes    Quit date: 04/07/1998    Years since quitting: 20.8  . Smokeless tobacco: Never Used  Substance Use Topics  . Alcohol use: Not Currently  . Drug use: Never    Review of Systems Constitutional: No fever/chills Eyes: No visual changes. ENT: No sore throat. Cardiovascular: Denies chest pain. Respiratory: Denies shortness of breath. Gastrointestinal: No abdominal pain.  Reports his bladder is uncomfortable pointing towards his lower abdomen. Genitourinary: Has a catheter Musculoskeletal: Negative for back pain. Skin: Negative for rash. Neurological: Negative for headaches,  areas of focal weakness or numbness.    ____________________________________________   PHYSICAL EXAM:  VITAL SIGNS: ED Triage Vitals  Enc Vitals Group     BP 01/31/19 1032 (!) 186/93     Pulse Rate 01/31/19 1032 92     Resp 01/31/19 1032 20     Temp 01/31/19 1032 98.4 F (36.9 C)     Temp Source 01/31/19 1032 Oral     SpO2 01/31/19 1032 97 %     Weight 01/31/19 1033 160 lb (72.6 kg)     Height 01/31/19 1033 5\' 11"  (1.803 m)     Head Circumference --      Peak Flow --      Pain  Score 01/31/19 1032 9     Pain Loc --      Pain Edu? --      Excl. in [place]? --     Constitutional: Alert and oriented. Well appearing and in no acute distress.  He seems just slightly tachypnea. Eyes: Conjunctivae are normal. Head: Atraumatic. Nose: No congestion/rhinnorhea. Mouth/Throat: Mucous membranes are moist. Neck: No stridor.  Cardiovascular: Normal rate, regular rhythm. Grossly normal heart sounds.  Good peripheral circulation. Respiratory: Normal respiratory effort.  No retractions. Lungs CTAB. Gastrointestinal: Soft and nontender. No distention except in the suprapubic region his bladder seems slightly distended and just slightly uncomfortable to palpation but no bilateral lower quadrant tenderness no rebound or guarding in any quadrant.  Normal testicular exam without tenderness or scrotal edema.  Penis is normal, nonerect, Foley catheter located within it without noted discharge.  No penile erosions are noted.  Musculoskeletal: No lower extremity tenderness with trace bilateral lower extremity edema. Neurologic:  Normal speech and language. No gross focal neurologic deficits are appreciated.  Somewhat poor historian. Skin:  Skin is warm, dry and intact. No rash noted. Psychiatric: Mood and affect are calm, pleasant. Speech and behavior are normal though he does seem somewhat disoriented to year.  ____________________________________________   LABS (all labs ordered are listed, but only abnormal results are displayed)  Labs Reviewed  CBC - Abnormal; Notable for the following components:      Result Value   Hemoglobin 12.1 (*)    HCT 35.8 (*)    All other components within normal limits  COMPREHENSIVE METABOLIC PANEL - Abnormal; Notable for the following components:   CO2 21 (*)    Glucose, Bld 128 (*)    BUN 37 (*)    Creatinine, Ser 2.76 (*)    Calcium 11.5 (*)    GFR calc non Af Amer 22 (*)    GFR calc Af Amer 25 (*)    All other components within normal limits   URINALYSIS, COMPLETE (UACMP) WITH MICROSCOPIC - Abnormal; Notable for the following components:   Color, Urine YELLOW (*)    APPearance TURBID (*)    Hgb urine dipstick MODERATE (*)    Protein, ur 100 (*)    Nitrite POSITIVE (*)    Leukocytes,Ua LARGE (*)    RBC / HPF >50 (*)    WBC, UA >50 (*)    Bacteria, UA RARE (*)    All other components within normal limits  URINE CULTURE  LIPASE, BLOOD   ____________________________________________  EKG  Reviewed inter by me at 1042 Heart rate 90 Cures 109 QTc 470 Normal sinus rhythm, left ventricular hypertrophy.  Inferior old Q waves.  Compared with April 28, 2018 ____________________________________________  RADIOLOGY   ____________________________________________   PROCEDURES  Procedure(s) performed: None  Procedures  Critical Care performed: No  ____________________________________________   INITIAL IMPRESSION / ASSESSMENT AND PLAN / ED COURSE  Pertinent labs & imaging results that were available during my care of the patient were reviewed by me and considered in my medical decision making (see chart for details).   Patient presents for evaluation for lower abdominal discomfort.  Also noted to have noted output in his Foley catheter and some lower abdominal bladder distention.  Bladder scan demonstrates retention at the bedside.  No fever, alert oriented to self and wife but not to year.  Discussion with his wife this is his baseline mental status he does have suffer from some slight chronic confusion.  Clinical Course as of Jan 30 1406  Mon Jan 31, 2019  1243 Reviewed UA and history with Dr. Diamantina Providence (Urology).  He recommends against antibiotics, at this point would culture urine and only treat if patient developing symptoms of UTI which I do not detect or see any signs or symptoms.  Patient's wife also confirmed does not have any obvious symptoms of UTI or fevers.  Patient appears much improved, resting comfortably  appears appropriate for discharge to home to continue care with urology   [MQ]    Clinical Course User Index [MQ] Delman Kitten, MD   ----------------------------------------- 12:30 PM on 01/31/2019 -----------------------------------------  Patient family arrived, wife reports that he started complaining last night pain.  Now that he has a new catheter and he seems to be feeling back to normal.  I agree, the patient is resting comfortably, his respiratory rate is normalized his breathing is normalized and he reports he feels much better.  I suspect he had a catheter blockage, after exchanging it he had 700 mL of output into the Foley now and he reports all discomfort and has he describes a "swelling" in the bladder area has gone away.  Paged and discussed case as well as urinalysis with Dr. Diamantina Providence Of urology.  Patient wife comfortable with plan for him to be discharged, continue follow-up with urology, discussed careful return precautions and that they need to monitor him closely for any signs of infection such as fever, signs that he becomes blocked again, pain, bag not emptying, foul odor or other new concerns arise.  ____________________________________________   FINAL CLINICAL IMPRESSION(S) / ED DIAGNOSES  Final diagnoses:  Complication, blocked Foley catheter, initial encounter [facility])        Note:  This document was prepared using Dragon voice recognition software and may include unintentional dictation errors       Delman Kitten, MD 01/31/19 1407

## 2019-01-31 NOTE — ED Notes (Signed)
Upon assessment, pt noted to be poor historian, pt c/o generalized abdominal tenderness with palpation as well as R sided suprapubic tenderness with palpation. Pt also c/o bilateral leg pain with palpation, mild BLE swelling noted on assessment. Pt also with noted intermittent dyspnea with exertion, O2 sats 100% on RA. +2 pulses to all extremities. Pt able to answer most questions, disoriented to time, states, "it's 2002". Pt however also answers most questions with "yeah" and upon assessment, it appears patient is poor historian, providing very minimal background information to questions asked. Dr. Jacqualine Code made aware at this time, at bedside assessing patient.

## 2019-01-31 NOTE — ED Notes (Signed)
Pt's foley bag changed to leg bag at this time.  Pt and family educated on new equipment and have no questions at this time.

## 2019-02-02 LAB — URINE CULTURE
Culture: 80000 — AB
Special Requests: NORMAL

## 2019-02-03 NOTE — Progress Notes (Signed)
Brief Pharmacy Note  Patient is a 73 y/o M with a chronic foley catheter who presented to [facility] on 10/26 c/o difficulty with urinary catheter and bladder tenderness. It was suspected that catheter was obstructed and after replacing catheter patient put out 700 mL urine and reported resolution of symptoms. Urine culture from ED visit has resulted 80k colonies/mL Providencia rettgeri. Patient is chronically colonized and had no symptoms of UTI at ED presentation. Spoke with patient's urologist, Dr. Erlene Quan, who recommends against antibiotic therapy at this time.  Lockie Mola, Pharmacy Resident 03 February 2019

## 2019-02-08 ENCOUNTER — Other Ambulatory Visit: Payer: Self-pay

## 2019-02-08 ENCOUNTER — Encounter: Payer: Self-pay | Admitting: Physician Assistant

## 2019-02-08 ENCOUNTER — Ambulatory Visit (INDEPENDENT_AMBULATORY_CARE_PROVIDER_SITE_OTHER): Payer: Medicare HMO | Admitting: Physician Assistant

## 2019-02-08 VITALS — BP 170/70 | HR 68 | Ht 68.0 in | Wt 160.0 lb

## 2019-02-08 DIAGNOSIS — R102 Pelvic and perineal pain: Secondary | ICD-10-CM | POA: Diagnosis not present

## 2019-02-08 DIAGNOSIS — R339 Retention of urine, unspecified: Secondary | ICD-10-CM | POA: Diagnosis not present

## 2019-02-08 LAB — BLADDER SCAN AMB NON-IMAGING: Scan Result: 823

## 2019-02-08 NOTE — Progress Notes (Signed)
02/08/2019 3:45 PM   Troy Cookey Jr. Jun 06, 1945 [id number]  CC: Suprapubic pain and fullness  HPI: Troy Berry. is a 73 y.o. male who presents today for evaluation of possible urinary retention. He is an established [facility] patient who last saw Dr. Erlene Quan on 02/26/2018 for urinary retention with repeat voiding trial. He failed this and was subsequently recatheterized. 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    He is accompanied today by his wife, who contributed to HPI.  He has continued with monthly catheter changes in our clinic, most recently on 01/21/2019.  He sought care in the ED 10 days later with complaints of suprapubic tenderness.  Foley catheter exchange in the ED with 700 mL urinary output.  He now presents to clinic with acute onset suprapubic tenderness and pain.  He reports the pain started yesterday afternoon.  He and his wife report minimal urinary output from the Foley in the interim.  PVR 841mL.  He denies fevers and chills.  Patient reports drinking water during the day.  No bladder installations performed.  PMH: Past Medical History:  Diagnosis Date  . Anemia   . CKD (chronic kidney disease), stage III   . Gout   . Hypertension   . Urinary retention    in-dwelling Foley since Nov 2019, sees Dr. Hollice Espy    Surgical History: No past surgical history on file.  Home Medications:  Allergies as of 02/08/2019      Reactions   Ace Inhibitors    Other reaction(s): Other (See Comments) lip swelling      Medication List       Accurate as of February 08, 2019  3:45 PM. If you have any questions, ask your nurse or doctor.        acetaminophen 325 MG tablet Commonly known as: TYLENOL Take 2 tablets (650 mg total) by mouth every 6 (six) hours as needed for mild pain (or Fever >/= 101).   allopurinol 300 MG tablet Commonly known as: ZYLOPRIM Take 300 mg by mouth daily.    amLODipine 10 MG tablet Commonly known as: NORVASC Take 10 mg by mouth daily.   aspirin EC 81 MG tablet Take 81 mg by mouth daily.   carvedilol 25 MG tablet Commonly known as: COREG   cholecalciferol 25 MCG (1000 UT) tablet Commonly known as: VITAMIN D3 Take 1,000 Units by mouth daily.   docusate sodium 100 MG capsule Commonly known as: Colace Take 1 capsule (100 mg total) by mouth 2 (two) times daily.   lovastatin 40 MG tablet Commonly known as: MEVACOR Take 1 tablet by mouth every evening.   polyethylene glycol 17 g packet Commonly known as: MiraLax Take 17 g by mouth daily.       Allergies:  Allergies  Allergen Reactions  . Ace Inhibitors     Other reaction(s): Other (See Comments) lip swelling    Family History: Family History  Problem Relation Age of Onset  . Cancer Father     Social History:   reports that he quit smoking about 20 years ago. His smoking use included cigarettes. He has never used smokeless tobacco. He reports previous alcohol use. He reports that he does not use drugs.  ROS: UROLOGY Frequent Urination?: No Hard to postpone urination?: Yes Burning/pain with urination?: Yes Get up at night to urinate?: Yes Leakage of urine?: Yes Urine stream starts and stops?: No Trouble starting stream?: No Do you have  to strain to urinate?: No Blood in urine?: No Urinary tract infection?: No Sexually transmitted disease?: No Injury to kidneys or bladder?: No Painful intercourse?: No Weak stream?: No Erection problems?: No Penile pain?: No  Gastrointestinal Nausea?: No Vomiting?: No Indigestion/heartburn?: No Diarrhea?: No Constipation?: No  Constitutional Fever: No Night sweats?: No Weight loss?: No Fatigue?: Yes  Skin Skin rash/lesions?: No Itching?: No  Eyes Blurred vision?: No Double vision?: No  Ears/Nose/Throat Sore throat?: Yes Sinus problems?: No  Hematologic/Lymphatic Swollen glands?: No Easy bruising?: No   Cardiovascular Leg swelling?: No Chest pain?: No  Respiratory Cough?: No Shortness of breath?: No  Endocrine Excessive thirst?: No  Musculoskeletal Back pain?: No Joint pain?: No  Neurological Headaches?: No Dizziness?: No  Psychologic Depression?: Yes Anxiety?: Yes  Physical Exam: BP (!) 170/70   Pulse 68   Ht 5\' 8"  (1.727 m)   Wt 160 lb (72.6 kg)   BMI 24.33 kg/m   Constitutional: Awake and alert, no acute distress, nontoxic appearing HEENT: [place], AT Cardiovascular: No clubbing, cyanosis, or edema Respiratory: Normal respiratory effort, no increased work of breathing GI: Abdomen is distended and tender.  Palpable bladder above the umbilicus. GU: Foley catheter in place draining amber urine, urinary sediment present in leg bag.  Catheter erosion on the lateral the caudal aspect of the penis. Skin: No rashes, bruises or suspicious lesions Neurologic: Shuffling, unsteady gait, using wheelchair Psychiatric: Normal mood and affect  Laboratory Data: Results for orders placed or performed in visit on 02/08/19  BLADDER SCAN AMB NON-IMAGING  Result Value Ref Range   Scan Result 823    Assessment & Plan:   1. Suprapubic pain, acute 73 year old male with chronic indwelling Foley presents with acute onset suprapubic pain and abdominal fullness 10 days following ED visit for the same.  He was found to have acute urinary retention at that time.  PVR 823 mL, I am concerned for catheter occlusion.  Catheter exchange today.  Procedure note below.  Cath Change/ Replacement Patient is present today for a catheter change due to urinary retention.  61ml of water was removed from the balloon, a 16FR foley cath was removed with out difficulty.  Patient was cleaned and prepped in a sterile fashion with betadine and 2% lidocaine jelly was instilled into the urethra. A 18FR coud foley cath was replaced into the bladder complications were noted as: Urinary sediment visualized within leg bag  with encrustation of the catheter tip Urine return was noted 720ml and urine was amber in color. The balloon was filled with 40ml of sterile water. A leg bag was attached for drainage.  A night bag was also given to the patient and patient was given instruction on how to change from one bag to another. Patient was given proper instruction on catheter care.   Performed by: Debroah Loop, PA-C   - BLADDER SCAN AMB NON-IMAGING  2. Urinary retention Patient has occluded 2 catheters in 17 days.  I counseled him to initiate vinegar bladder installations at this time, at least once daily.  I counseled his wife extensively on how to do this, as well as provided her with a written handout with instructions for creating a vinegar solution, a 70 mL [place] syringe, and a catheter plug.  I counseled her to contact the office if she has any remaining questions.  She expressed understanding.  Return in about 4 weeks (around 03/08/2019) for Catheter exchange.  Debroah Loop, PA-C  [address], [address]  [place], [place] [address] [phone]

## 2019-02-21 ENCOUNTER — Ambulatory Visit: Payer: Medicare HMO | Admitting: Physician Assistant

## 2019-03-10 ENCOUNTER — Ambulatory Visit: Payer: Medicare HMO | Admitting: Physician Assistant

## 2019-03-12 ENCOUNTER — Encounter: Payer: Self-pay | Admitting: Emergency Medicine

## 2019-03-12 ENCOUNTER — Emergency Department
Admission: EM | Admit: 2019-03-12 | Discharge: 2019-03-12 | Disposition: A | Discharge status: Home / Self Care | Payer: Medicare HMO | Attending: Emergency Medicine | Admitting: Emergency Medicine

## 2019-03-12 ENCOUNTER — Other Ambulatory Visit: Payer: Self-pay

## 2019-03-12 DIAGNOSIS — T83091A Other mechanical complication of indwelling urethral catheter, initial encounter: Secondary | ICD-10-CM | POA: Diagnosis present

## 2019-03-12 DIAGNOSIS — R339 Retention of urine, unspecified: Secondary | ICD-10-CM | POA: Insufficient documentation

## 2019-03-12 DIAGNOSIS — Y733 Surgical instruments, materials and gastroenterology and urology devices (including sutures) associated with adverse incidents: Secondary | ICD-10-CM | POA: Insufficient documentation

## 2019-03-12 DIAGNOSIS — N183 Chronic kidney disease, stage 3 unspecified: Secondary | ICD-10-CM | POA: Insufficient documentation

## 2019-03-12 DIAGNOSIS — Z87891 Personal history of nicotine dependence: Secondary | ICD-10-CM | POA: Diagnosis not present

## 2019-03-12 DIAGNOSIS — I129 Hypertensive chronic kidney disease with stage 1 through stage 4 chronic kidney disease, or unspecified chronic kidney disease: Secondary | ICD-10-CM | POA: Diagnosis not present

## 2019-03-12 DIAGNOSIS — T839XXA Unspecified complication of genitourinary prosthetic device, implant and graft, initial encounter: Secondary | ICD-10-CM

## 2019-03-12 MED ORDER — LIDOCAINE HCL URETHRAL/MUCOSAL 2 % EX GEL: 1.0000 "application " | Freq: Once | CUTANEOUS | Status: DC

## 2019-03-12 NOTE — ED Provider Notes (Addendum)
[facility] Emergency Department Provider Note       Time seen: ----------------------------------------- 2:46 PM on 03/12/2019 -----------------------------------------   I have reviewed the triage vital signs and the nursing notes.  HISTORY   Chief Complaint Foley Catheter Problem and Urinary Retention    HPI Troy Berry. is a 73 y.o. male with a history of anemia, chronic kidney disease, gout, hypertension, urinary retention who presents to the ED for Foley catheter dysfunction.  Daughter reports problem with his Foley catheter, states is stopped up and not draining.  He supposed to have his Foley catheter changed on Thursday but they had to cancel the appointment.  Patient complains of pain at the insertion site at the head of his penis.  Past Medical History:  Diagnosis Date  . Anemia   . CKD (chronic kidney disease), stage III   . Gout   . Hypertension   . Urinary retention    in-dwelling Foley since Nov 2019, sees Dr. Hollice Espy    Patient Active Problem List   Diagnosis Date Noted  . Acute lower UTI 04/29/2018  . AKI (acute kidney injury) ([place])   . Other hydronephrosis   . Urinary retention   . ARF (acute renal failure) ([place]) 07/29/2017  . Anemia 03/09/2014  . Chronic kidney disease 03/09/2014  . Essential hypertension 03/09/2014    History reviewed. No pertinent surgical history.  Allergies Ace inhibitors  Social History Social History   Tobacco Use  . Smoking status: Former Smoker    Types: Cigarettes    Quit date: 04/07/1998    Years since quitting: 20.9  . Smokeless tobacco: Never Used  Substance Use Topics  . Alcohol use: Not Currently  . Drug use: Never   Review of Systems Constitutional: Negative for fever. Gastrointestinal: Negative for abdominal pain, vomiting and diarrhea. Genitourinary: Positive for Foley catheter pain Skin: Negative for rash. Neurological: Negative for headaches, focal weakness or  numbness.  All systems negative/normal/unremarkable except as stated in the HPI  ____________________________________________   PHYSICAL EXAM:  VITAL SIGNS: ED Triage Vitals  Enc Vitals Group     BP 03/12/19 1346 (!) 143/67     Pulse Rate 03/12/19 1346 62     Resp 03/12/19 1346 18     Temp 03/12/19 1346 98.1 F (36.7 C)     Temp Source 03/12/19 1346 Oral     SpO2 03/12/19 1346 100 %     Weight 03/12/19 1343 150 lb (68 kg)     Height 03/12/19 1343 5\' 9"  (1.753 m)     Head Circumference --      Peak Flow --      Pain Score --      Pain Loc --      Pain Edu? --      Excl. in [place]? --    Constitutional: Alert, No acute distress Abdominal: Nontender, normal bowel sounds Genitourinary: Foley catheter in position, there is some erosion of the urethra and the glans penis, no other abnormalities are noted. Musculoskeletal: Nontender with normal range of motion in extremities. No lower extremity tenderness nor edema. Neurologic:  Normal speech and language. No gross focal neurologic deficits are appreciated.  Skin:  Skin is warm, dry and intact. No rash noted. Psychiatric: Mood and affect are normal. Speech and behavior are normal.  ___________________________________________  ED COURSE:  As part of my medical decision making, I reviewed the following data within the [place] History obtained from family if  available, nursing notes, old chart and ekg, as well as notes from prior ED visits. Patient presented for Foley catheter dysfunction, we will assess with labs after replacing his Foley catheter   Procedures  [name] was evaluated in Emergency Department on 03/12/2019 for the symptoms described in the history of present illness. He was evaluated in the context of the global COVID-19 pandemic, which necessitated consideration that the patient might be at risk for infection with the SARS-CoV-2 virus that causes COVID-19. Institutional protocols and  algorithms that pertain to the evaluation of patients at risk for COVID-19 are in a state of rapid change based on information released by regulatory bodies including the CDC and federal and state organizations. These policies and algorithms were followed during the patient's care in the ED.  ____________________________________________   LABS (pertinent positives/negatives)  Labs Reviewed  URINE CULTURE  URINALYSIS, ROUTINE W REFLEX MICROSCOPIC  ____________________________________________   DIFFERENTIAL DIAGNOSIS   Foley catheter dysfunction, UTI  FINAL ASSESSMENT AND PLAN  Foley catheter replacement   Plan: The patient had presented for Foley catheter replacement. Patient's labs were sent regarding urinalysis and urine culture.  He does not have any symptoms of UTI so we will await culture of his urine.  Otherwise he is cleared for outpatient follow-up.   Laurence Aly, MD    Note: This note was generated in part or whole with voice recognition software. Voice recognition is usually quite accurate but there are transcription errors that can and very often do occur. I apologize for any typographical errors that were not detected and corrected.     Earleen Newport, MD 03/12/19 1448    Earleen Newport, MD 03/12/19 [phone]

## 2019-03-12 NOTE — ED Notes (Signed)
Pt's foley not draining per family since yesterday. Pt with c/o abdominal pain. Foley has small amount of yellow cloudy urine.

## 2019-03-12 NOTE — ED Triage Notes (Addendum)
Pt arrived via POV with daughter with reports of problem with foley catheter, states it is "stopped up" and is not draining urine x 1 day.  Pt was supposed to go to urology on Thursday, but they had to cancel the appointment.   Amber urine with some cloudiness and sediment noted in bag at this time and in the tube.  Daughter states the catheter was due to be changed on Thursday.

## 2019-03-24 ENCOUNTER — Telehealth: Payer: Self-pay | Admitting: Family Medicine

## 2019-03-24 ENCOUNTER — Encounter: Payer: Self-pay | Admitting: Physician Assistant

## 2019-03-24 ENCOUNTER — Other Ambulatory Visit: Payer: Self-pay

## 2019-03-24 ENCOUNTER — Ambulatory Visit: Payer: Medicare HMO | Admitting: Physician Assistant

## 2019-03-24 VITALS — BP 143/84 | HR 64 | Ht 69.0 in

## 2019-03-24 DIAGNOSIS — R338 Other retention of urine: Secondary | ICD-10-CM

## 2019-03-24 NOTE — Progress Notes (Signed)
Cath Change/ Replacement  Patient is present today for a catheter change due to catheter occlusion.  42ml of water was removed from the balloon, a 16FR foley cath was removed with out difficulty.  Patient was cleaned and prepped in a sterile fashion with betadine and 2% lidocaine jelly was instilled into the urethra. A 16 FR coud foley cath was replaced into the bladder no complications were noted Urine return was noted 237ml and urine was yellow in color. The balloon was filled with 55ml of sterile water. A night bag was attached for drainage.  An additional night bag and leg strap were given to the patient and patient was given instruction on how to change from one bag to another. Patient was given proper instruction on catheter care.    Performed by: Debroah Loop, PA-C   Follow up: Return in about 4 weeks (around 04/21/2019) for Catheter exchange.   Additional Notes: Significant encrustation noted at the Foley catheter tip.  Counseled him to significantly increase oral hydration and include citric acid in his diet.  He expressed understanding.

## 2019-03-24 NOTE — Telephone Encounter (Signed)
Patient's wife called and states patient's catheter is not draining. He has been added to the schedule today.

## 2019-04-06 ENCOUNTER — Ambulatory Visit: Payer: Medicare HMO | Admitting: Physician Assistant

## 2019-04-12 ENCOUNTER — Other Ambulatory Visit: Payer: Self-pay

## 2019-04-12 ENCOUNTER — Encounter: Payer: Self-pay | Admitting: Physician Assistant

## 2019-04-12 ENCOUNTER — Ambulatory Visit (INDEPENDENT_AMBULATORY_CARE_PROVIDER_SITE_OTHER): Payer: Medicare HMO | Admitting: Physician Assistant

## 2019-04-12 VITALS — BP 145/76 | HR 59

## 2019-04-12 DIAGNOSIS — T83198A Other mechanical complication of other urinary devices and implants, initial encounter: Secondary | ICD-10-CM | POA: Diagnosis not present

## 2019-04-12 DIAGNOSIS — R338 Other retention of urine: Secondary | ICD-10-CM | POA: Diagnosis not present

## 2019-04-12 MED ORDER — RENACIDIN IR SOLN: 30.0000 mL | 5 refills | Status: DC

## 2019-04-12 NOTE — Progress Notes (Signed)
Cath Change/ Replacement  Patient is present today for a catheter change due to urinary retention.  38ml of water was removed from the balloon, a 16FR coud foley cath was removed with out difficulty.  Patient was cleaned and prepped in a sterile fashion with betadine and 2% lidocaine jelly was instilled into the urethra. A 16 FR coud foley cath was replaced into the bladder no complications were noted Urine return was noted 238ml and urine was yellow in color. The balloon was filled with 49ml of sterile water. A night bag was attached for drainage.  Patient declined an additional leg or night bag.  Patient was given proper instruction on catheter care.    Performed by: Debroah Loop, PA-C and Gaspar Cola, CMA  Follow up: Return in about 4 weeks (around 05/10/2019) for Catheter exchange.

## 2019-04-12 NOTE — Progress Notes (Signed)
04/12/2019 5:47 PM   Troy Cookey Jr. March 08, 1946 [id number]  CC: Leaking Foley catheter  HPI: Troy Berry. is a 74 y.o. male who presents today for evaluation of Foley catheter problems.  His wife reports that his Foley catheter has not drained any urine since this morning despite p.o. fluid intake.  Additionally, she reports some urinary leakage from around the catheter tubing.  On physical exam, catheter tubing is cloudy in appearance with an empty overnight bag attached.  He denies suprapubic discomfort.  Patient was previously counseled to initiate vinegar instillations due to his history of catheter encrustation and occlusion.  His wife reports she was doing this once daily, however she has not done it recently.  She reports she is having a difficult time managing his catheter and mixing the vinegar solution as instructed.  PMH: Past Medical History:  Diagnosis Date  . Anemia   . CKD (chronic kidney disease), stage III   . Gout   . Hypertension   . Urinary retention    in-dwelling Foley since Nov 2019, sees Dr. Hollice Berry    Surgical History: No past surgical history on file.  Home Medications:  Allergies as of 04/12/2019      Reactions   Ace Inhibitors    Other reaction(s): Other (See Comments) lip swelling      Medication List       Accurate as of April 12, 2019  5:47 PM. If you have any questions, ask your nurse or doctor.        acetaminophen 325 MG tablet Commonly known as: TYLENOL Take 2 tablets (650 mg total) by mouth every 6 (six) hours as needed for mild pain (or Fever >/= 101).   allopurinol 300 MG tablet Commonly known as: ZYLOPRIM Take 300 mg by mouth daily.   amLODipine 10 MG tablet Commonly known as: NORVASC Take 10 mg by mouth daily.   aspirin EC 81 MG tablet Take 81 mg by mouth daily.   carvedilol 25 MG tablet Commonly known as: COREG   cholecalciferol 25 MCG (1000 UT) tablet Commonly known as: VITAMIN D3 Take  1,000 Units by mouth daily.   docusate sodium 100 MG capsule Commonly known as: Colace Take 1 capsule (100 mg total) by mouth 2 (two) times daily.   gluconic acid-citric acid irrigation Irrigate with 30 mLs as directed 2 (two) times a week. Start taking on: April 14, 2019 Started by: Debroah Loop, PA-C   lovastatin 40 MG tablet Commonly known as: MEVACOR Take 1 tablet by mouth every evening.   polyethylene glycol 17 g packet Commonly known as: MiraLax Take 17 g by mouth daily.       Allergies:  Allergies  Allergen Reactions  . Ace Inhibitors     Other reaction(s): Other (See Comments) lip swelling    Family History: Family History  Problem Relation Age of Onset  . Cancer Father     Social History:   reports that he quit smoking about 21 years ago. His smoking use included cigarettes. He has never used smokeless tobacco. He reports previous alcohol use. He reports that he does not use drugs.  ROS: UROLOGY Frequent Urination?: No Hard to postpone urination?: No Burning/pain with urination?: No Get up at night to urinate?: No Leakage of urine?: Yes Urine stream starts and stops?: No Trouble starting stream?: No Do you have to strain to urinate?: No Blood in urine?: No Urinary tract infection?: No Sexually transmitted disease?: No Injury to kidneys  or bladder?: No Painful intercourse?: No Weak stream?: No Erection problems?: No Penile pain?: No  Gastrointestinal Nausea?: No Vomiting?: No Indigestion/heartburn?: No Diarrhea?: No Constipation?: No  Constitutional Fever: No Night sweats?: No Weight loss?: No Fatigue?: No  Skin Skin rash/lesions?: No Itching?: No  Eyes Blurred vision?: No Double vision?: No  Ears/Nose/Throat Sore throat?: No Sinus problems?: No  Hematologic/Lymphatic Swollen glands?: No Easy bruising?: No  Cardiovascular Leg swelling?: No Chest pain?: No  Respiratory Cough?: No Shortness of breath?:  No  Endocrine Excessive thirst?: No  Musculoskeletal Back pain?: No Joint pain?: No  Neurological Headaches?: No Dizziness?: No  Psychologic Depression?: No Anxiety?: No  Physical Exam: BP (!) 145/76   Pulse (!) 59   Constitutional:  Alert and oriented, no acute distress, nontoxic appearing HEENT: [name], AT Cardiovascular: No clubbing, cyanosis, or edema Respiratory: Normal respiratory effort, no increased work of breathing GI: Abdomen is soft, nontender, nondistended.  Right inguinal hernia noted on physical exam, nontender and reducible. GU: Catheter erosion noted on the ventral aspect of the penis extending to the corona. Skin: No rashes, bruises or suspicious lesions Neurologic: Grossly intact, no focal deficits, moving all 4 extremities Psychiatric: Normal mood and affect  Assessment & Plan:   1. Retention of urine due to occlusion of Foley catheter [facility]) 74 year old male with chronic indwelling Foley with a history of urinary encrustation with catheter occlusion presents today with complaints of sensation of urinary drainage into his Foley collection bag.  I attempted to irrigate his catheter in clinic today, however was unable to instill sterile saline through his catheter tubing.  Instead, I elected to replace his urinary catheter today.  See separate procedure note.  Patient and his wife report difficulty managing his catheter.  I believe he would benefit from home health to assist with catheter irrigation.  I have ordered an ambulatory referral to home health as well as Renacidin today for management of this.  If we cannot obtain insurance coverage for prescription bladder irrigations, I counseled the patient that he should continue with vinegar instillations as previously instructed 2-3 times daily.  I also repeated my previous counseling to increase fluid intake and augment his fluids with citric acid for further treatment of urinary encrustation.  He expressed  understanding. - Ambulatory referral to [facility] - gluconic acid-citric acid (RENACIDIN) irrigation; Irrigate with 30 mLs as directed 2 (two) times a week.  Dispense: 240 mL; Refill: 5  Return in about 4 weeks (around 05/10/2019) for Catheter exchange.  Debroah Loop, PA-C  [facility] [address] [place], [place] [address] [phone]

## 2019-04-14 NOTE — Progress Notes (Signed)
Spoke with [place] at [facility] in [place] she states they will cover patient for home health catheter care

## 2019-04-18 ENCOUNTER — Telehealth: Payer: Self-pay | Admitting: Physician Assistant

## 2019-04-18 NOTE — Telephone Encounter (Signed)
Pharmacy called and asked if a cheaper alternative could be called in for pt for Renacidin. His copay is $180.00 for this med. Please advise.

## 2019-04-19 ENCOUNTER — Other Ambulatory Visit: Payer: Self-pay

## 2019-04-19 ENCOUNTER — Encounter: Payer: Self-pay | Admitting: Urology

## 2019-04-19 ENCOUNTER — Ambulatory Visit (INDEPENDENT_AMBULATORY_CARE_PROVIDER_SITE_OTHER): Payer: Medicare HMO | Admitting: Urology

## 2019-04-19 VITALS — BP 144/76 | HR 76 | Ht 72.0 in | Wt 150.0 lb

## 2019-04-19 DIAGNOSIS — T839XXA Unspecified complication of genitourinary prosthetic device, implant and graft, initial encounter: Secondary | ICD-10-CM

## 2019-04-19 NOTE — Progress Notes (Signed)
04/19/2019 5:10 PM   Troy Berry [birth year] [id number]  Referring provider: Center, [facility] [address],  [place] [address]  Chief Complaint  Patient presents with  . Follow-up    HPI: Troy Berry is a 74 year old male with a history of hematuria, BPH with urinary retention and an indwelling chronic Foley who presents today with his wife, Letta Median with complaints of Foley issues.   They were last seen in the office on 04/12/2019 by Thomas Hoff, PA-C for the Foley not draining.  It was found that his catheter was encrusted and occluded with sediment.  His Foley was exchanged at that visit and he was prescribed Renacidin and referred to [facility].    The [facility] have found the Renacidin cost prohibitive and have not acquired the solution at this time.  Mrs. Greener states home health has been out to their home and plan to return on Friday.   Bladder stones seen on 07/2018 CT renal stone study.  90 cc prostate.    PMH: Past Medical History:  Diagnosis Date  . Anemia   . CKD (chronic kidney disease), stage III   . Gout   . Hypertension   . Urinary retention    in-dwelling Foley since Nov 2019, sees Dr. Hollice Espy    Surgical History: No past surgical history on file.  Home Medications:  Allergies as of 04/19/2019      Reactions   Ace Inhibitors    Other reaction(s): Other (See Comments) lip swelling      Medication List       Accurate as of April 19, 2019 11:59 PM. If you have any questions, ask your nurse or doctor.        acetaminophen 325 MG tablet Commonly known as: TYLENOL Take 2 tablets (650 mg total) by mouth every 6 (six) hours as needed for mild pain (or Fever >/= 101).   allopurinol 300 MG tablet Commonly known as: ZYLOPRIM Take 300 mg by mouth daily.   amLODipine 10 MG tablet Commonly known as: NORVASC Take 10 mg by mouth daily.   aspirin EC 81 MG tablet Take 81 mg by mouth daily.     carvedilol 25 MG tablet Commonly known as: COREG   cholecalciferol 25 MCG (1000 UNIT) tablet Commonly known as: VITAMIN D3 Take 1,000 Units by mouth daily.   docusate sodium 100 MG capsule Commonly known as: Colace Take 1 capsule (100 mg total) by mouth 2 (two) times daily.   gluconic acid-citric acid irrigation Irrigate with 30 mLs as directed 2 (two) times a week.   lovastatin 40 MG tablet Commonly known as: MEVACOR Take 1 tablet by mouth every evening.   polyethylene glycol 17 g packet Commonly known as: MiraLax Take 17 g by mouth daily.       Allergies:  Allergies  Allergen Reactions  . Ace Inhibitors     Other reaction(s): Other (See Comments) lip swelling    Family History: Family History  Problem Relation Age of Onset  . Cancer Father     Social History:  reports that he quit smoking about 21 years ago. His smoking use included cigarettes. He has never used smokeless tobacco. He reports previous alcohol use. He reports that he does not use drugs.  ROS: UROLOGY Frequent Urination?: No Hard to postpone urination?: No Burning/pain with urination?: No Get up at night to urinate?: No Leakage of urine?: No Urine stream starts and stops?: No  Trouble starting stream?: No Do you have to strain to urinate?: No Blood in urine?: No Urinary tract infection?: No Sexually transmitted disease?: No Injury to kidneys or bladder?: No Painful intercourse?: No Weak stream?: No Erection problems?: No Penile pain?: No  Gastrointestinal Nausea?: No Vomiting?: No Indigestion/heartburn?: No Diarrhea?: No Constipation?: No  Constitutional Fever: No Night sweats?: No Weight loss?: No Fatigue?: No  Skin Skin rash/lesions?: No Itching?: No  Eyes Blurred vision?: No Double vision?: No  Ears/Nose/Throat Sore throat?: No Sinus problems?: No  Hematologic/Lymphatic Swollen glands?: No Easy bruising?: No  Cardiovascular Leg swelling?: No Chest pain?:  No  Respiratory Cough?: No Shortness of breath?: No  Endocrine Excessive thirst?: No  Musculoskeletal Back pain?: No Joint pain?: No  Neurological Headaches?: No Dizziness?: No  Psychologic Depression?: No Anxiety?: No  Physical Exam: BP (!) 144/76   Pulse 76   Ht 6' (1.829 m)   Wt 150 lb (68 kg)   BMI 20.34 kg/m   Constitutional:  Well nourished. Alert and oriented, No acute distress. HEENT: [place] AT, mask in place.  Trachea midline, no masses. Cardiovascular: No clubbing, cyanosis, or edema. Respiratory: Normal respiratory effort, no increased work of breathing. GI: Abdomen is soft, non tender, non distended, no abdominal masses. Liver and spleen not palpable.  No hernias appreciated.  Stool sample for occult testing is not indicated.   GU: No CVA tenderness.  No bladder fullness or masses.  Patient with circumcised phallus.  Urethral meatus is patent with mild erosion. No penile discharge. No penile lesions or rashes. Foley in place.  Scrotum without lesions, cysts, rashes and/or edema.   Neurologic: Grossly intact, no focal deficits, moving all 4 extremities. Psychiatric: Normal mood and affect.  Laboratory Data: Lab Results  Component Value Date   WBC 8.3 01/31/2019   HGB 12.1 (L) 01/31/2019   HCT 35.8 (L) 01/31/2019   MCV 80.1 01/31/2019   PLT 173 01/31/2019    Lab Results  Component Value Date   CREATININE 2.76 (H) 01/31/2019    No results found for: PSA  No results found for: TESTOSTERONE  No results found for: HGBA1C  No results found for: TSH  No results found for: CHOL, HDL, CHOLHDL, VLDL, LDLCALC  Lab Results  Component Value Date   AST 17 01/31/2019   Lab Results  Component Value Date   ALT 11 01/31/2019   No components found for: ALKALINEPHOPHATASE No components found for: BILIRUBINTOTAL  No results found for: ESTRADIOL  Urinalysis    Component Value Date/Time   COLORURINE YELLOW (A) 01/31/2019 1148   APPEARANCEUR TURBID (A)  01/31/2019 1148   APPEARANCEUR Haz[place] 09/26/2011 1609   LABSPEC 1.008 01/31/2019 1148   LABSPEC 1.010 09/26/2011 1609   PHURINE 8.0 01/31/2019 1148   GLUCOSEU NEGATIVE 01/31/2019 1148   GLUCOSEU 150 mg/dL 09/26/2011 1609   HGBUR MODERATE (A) 01/31/2019 [place] 01/31/2019 1148   BILIRUBINUR Negative 09/26/2011 1609   KETONESUR NEGATIVE 01/31/2019 1148   PROTEINUR 100 (A) 01/31/2019 1148   NITRITE POSITIVE (A) 01/31/2019 1148   LEUKOCYTESUR LARGE (A) 01/31/2019 1148   LEUKOCYTESUR Negative 09/26/2011 1609    I have reviewed the labs.  Bladder Irrigation Due to sediment patient is present today for a bladder irrigation. Patient was cleaned and prepped in a sterile fashion. 200 ml of saline/sterile water was instilled and irrigated into the bladder with a 5ml To[place]y syringe through the catheter in place.  After irrigation urine flow was noted no complications were  noted catheter is now draining fine.  Catheter was reattached to the overnight bag for drainage. Patient tolerated well.   Performed by: Zara Council, PA-C    Assessment & Plan:    1. Chronic indwelling Foley Will research alternatives for Renacidin in an effort to offset the cost a bladder irrigant in the interim they will continue with acetic acid irrigations There were also findings concerning for bladder stones on 07/2018 CT scan, so we will repeat the scan as the stones did not appear on an abdominal xray taken one month later   Return for keep 05/12/2019 appointment with sam .  These notes generated with voice recognition software. I apologize for typographical errors.  Zara Council, PA-C  [facility] [address]  [place], [place] [address] [phone]

## 2019-04-20 ENCOUNTER — Ambulatory Visit: Payer: Medicare HMO | Admitting: Physician Assistant

## 2019-04-22 ENCOUNTER — Telehealth: Payer: Self-pay | Admitting: Urology

## 2019-04-22 ENCOUNTER — Telehealth: Payer: Self-pay | Admitting: Physician Assistant

## 2019-04-22 NOTE — Telephone Encounter (Signed)
We received a phone call today from patient's home health nurse reporting that Troy Berry was complaining of abdominal pain.  I called and spoke with his wife via telephone this afternoon.  She reports Troy Berry has been very constipated and is refusing MiraLAX.  She has made an appointment for further evaluation at [facility] next week.  She reports that his catheter continues to drain.  It is unclear if his pain is urologic in nature but I suspect it is secondary to constipation.  I counseled her to follow-up with [place] if his symptoms worsen.  She expressed understanding.

## 2019-04-22 NOTE — Telephone Encounter (Signed)
Called Clinical Manager with [facility] [phone]) to update order for irrigation with vinegar solution due to patient not being able to afford Renacidin. Left message for her to return call

## 2019-04-22 NOTE — Telephone Encounter (Signed)
I looked on [employer] to see if it offered the neomycin-poly mycin or gentamycin solution for bladder irrigation, but it only offered the creams or a very small bottle of the solutions for eye/ear drops.  Would we be able to order it and have him come in and irrigate with it for him?

## 2019-04-23 ENCOUNTER — Telehealth: Payer: Self-pay | Admitting: Urology

## 2019-04-23 DIAGNOSIS — N200 Calculus of kidney: Secondary | ICD-10-CM

## 2019-04-23 NOTE — Telephone Encounter (Signed)
I would like Troy Berry to have a CT renal stone study before he returns on the 4th.  He had possible bladder stones seen on CT in 07/2018 and I would like to see the status of the stones at this time.  It may be one of the reasons he keep occluding his catheter.

## 2019-04-25 NOTE — Telephone Encounter (Signed)
I have sent Troy Berry a message already to call him to schedule    Troy Berry

## 2019-04-26 NOTE — Telephone Encounter (Signed)
Will contact physician services to see if we can order

## 2019-04-26 NOTE — Telephone Encounter (Signed)
Physician services is able to order Renacidin 46ml ampule in 30-count box. Will order and have Q2004 built into the system for charging

## 2019-04-26 NOTE — Telephone Encounter (Signed)
Spoke with leslie at [facility] and gave updated order, she verbalized order back. She states medicare will not pay for irrigation services indefinitely due to this being a teachable service. Patient's wife is not able to learn will reach out to patient's daughter to see if she is able to.

## 2019-04-26 NOTE — Telephone Encounter (Signed)
Left message for patient's daughter to call.

## 2019-04-27 ENCOUNTER — Emergency Department: Payer: Medicare HMO

## 2019-04-27 ENCOUNTER — Emergency Department
Admission: EM | Admit: 2019-04-27 | Discharge: 2019-04-27 | Disposition: A | Discharge status: Home / Self Care | Payer: Medicare HMO | Attending: Student | Admitting: Student

## 2019-04-27 ENCOUNTER — Encounter: Payer: Self-pay | Admitting: Emergency Medicine

## 2019-04-27 ENCOUNTER — Other Ambulatory Visit: Payer: Self-pay

## 2019-04-27 DIAGNOSIS — N183 Chronic kidney disease, stage 3 unspecified: Secondary | ICD-10-CM | POA: Insufficient documentation

## 2019-04-27 DIAGNOSIS — Z79899 Other long term (current) drug therapy: Secondary | ICD-10-CM | POA: Diagnosis not present

## 2019-04-27 DIAGNOSIS — Z87891 Personal history of nicotine dependence: Secondary | ICD-10-CM | POA: Insufficient documentation

## 2019-04-27 DIAGNOSIS — Z960 Presence of urogenital implants: Secondary | ICD-10-CM | POA: Diagnosis not present

## 2019-04-27 DIAGNOSIS — I129 Hypertensive chronic kidney disease with stage 1 through stage 4 chronic kidney disease, or unspecified chronic kidney disease: Secondary | ICD-10-CM | POA: Insufficient documentation

## 2019-04-27 DIAGNOSIS — R39198 Other difficulties with micturition: Secondary | ICD-10-CM | POA: Diagnosis present

## 2019-04-27 DIAGNOSIS — R319 Hematuria, unspecified: Secondary | ICD-10-CM | POA: Insufficient documentation

## 2019-04-27 DIAGNOSIS — Z7982 Long term (current) use of aspirin: Secondary | ICD-10-CM | POA: Insufficient documentation

## 2019-04-27 DIAGNOSIS — T839XXA Unspecified complication of genitourinary prosthetic device, implant and graft, initial encounter: Secondary | ICD-10-CM

## 2019-04-27 LAB — URINALYSIS, COMPLETE (UACMP) WITH MICROSCOPIC
Bacteria, UA: NONE SEEN
RBC / HPF: >50 RBC/hpf — ABNORMAL HIGH (ref 0–5)
Specific Gravity, Urine: 1.014 (ref 1.005–1.030)

## 2019-04-27 LAB — COMPREHENSIVE METABOLIC PANEL
ALT: 6 U/L (ref 0–44)
AST: 12 U/L — ABNORMAL LOW (ref 15–41)
Albumin: 3.9 g/dL (ref 3.5–5.0)
Alkaline Phosphatase: 44 U/L (ref 38–126)
Anion gap: 8 (ref 5–15)
BUN: 35 mg/dL — ABNORMAL HIGH (ref 8–23)
CO2: 24 mmol/L (ref 22–32)
Calcium: 10.6 mg/dL — ABNORMAL HIGH (ref 8.9–10.3)
Chloride: 103 mmol/L (ref 98–111)
Creatinine, Ser: 2.41 mg/dL — ABNORMAL HIGH (ref 0.61–1.24)
GFR calc Af Amer: 30 mL/min — ABNORMAL LOW (ref >60)
GFR calc non Af Amer: 25 mL/min — ABNORMAL LOW (ref >60)
Glucose, Bld: 85 mg/dL (ref 70–99)
Potassium: 3.6 mmol/L (ref 3.5–5.1)
Sodium: 135 mmol/L (ref 135–145)
Total Bilirubin: 0.9 mg/dL (ref 0.3–1.2)
Total Protein: 7.1 g/dL (ref 6.5–8.1)

## 2019-04-27 LAB — CBC WITH DIFFERENTIAL/PLATELET
Abs Immature Granulocytes: 0.07 10*3/uL (ref 0.00–0.07)
Basophils Absolute: 0 10*3/uL (ref 0.0–0.1)
Basophils Relative: 1 %
Eosinophils Absolute: 0.1 10*3/uL (ref 0.0–0.5)
Eosinophils Relative: 1 %
HCT: 34.4 % — ABNORMAL LOW (ref 39.0–52.0)
Hemoglobin: 11.3 g/dL — ABNORMAL LOW (ref 13.0–17.0)
Immature Granulocytes: 1 %
Lymphocytes Relative: 3 %
Lymphs Abs: 0.2 10*3/uL — ABNORMAL LOW (ref 0.7–4.0)
MCH: 26.7 pg (ref 26.0–34.0)
MCHC: 32.8 g/dL (ref 30.0–36.0)
MCV: 81.1 fL (ref 80.0–100.0)
Monocytes Absolute: 0.2 10*3/uL (ref 0.1–1.0)
Monocytes Relative: 2 %
Neutro Abs: 7.7 10*3/uL (ref 1.7–7.7)
Neutrophils Relative %: 92 %
Platelets: 188 10*3/uL (ref 150–400)
RBC: 4.24 MIL/uL (ref 4.22–5.81)
RDW: 14.7 % (ref 11.5–15.5)
WBC: 8.3 10*3/uL (ref 4.0–10.5)
nRBC: 0 % (ref 0.0–0.2)

## 2019-04-27 MED ORDER — CEFDINIR 300 MG PO CAPS: 300.0000 mg | ORAL_CAPSULE | Freq: Two times a day (BID) | ORAL | 0 refills | Status: AC

## 2019-04-27 NOTE — ED Triage Notes (Addendum)
Pt to triage via w/c with no distress noted, mask in place; pt reports having penile pain around catheter with blood noted to catheter today; apparently pt has had cath in place for awhile for chronic retention, according to urology notes, st has possible bladder stones noted on CT and had recommended pt have repeat CT renal stone study for status of such since he cont to occlude his catheter;pt reports Lemay changed his cath today; spoke with pt's daughter via telephone who reports [facility] nurse did change cath today but was told that she used a smaller size and the bleeding began after insertion; st "this has never happened before where he had blood in the catheter"

## 2019-04-27 NOTE — ED Provider Notes (Signed)
[facility] Emergency Department Provider Note  ____________________________________________   First MD Initiated Contact with Patient 04/27/19 2124     (approximate)  I have reviewed the triage vital signs and the nursing notes.  History  Chief Complaint Hematuria    HPI Troy Berry. is a 74 y.o. male with hx of CKD, urinary retention w/ chronic indwelling Foley catheter who presents for decreased urine output and hematuria after having his catheter exchanged today.   Wife states the home health nurse exchanged his catheter out today, shortly afterward she noticed bright red urine, hematuria.  She also noticed decreased urine output.  Patient reports pain in his penis which started after the exchange as well.  8/10 in severity, constant since onset, sharp/stabbing/burning, no radiation, no alleviating/aggravating components.  Not on any anticoagulation.   Past Medical Hx Past Medical History:  Diagnosis Date  . Anemia   . CKD (chronic kidney disease), stage III   . Gout   . Hypertension   . Urinary retention    in-dwelling Foley since Nov 2019, sees Dr. Hollice Espy    Problem List Patient Active Problem List   Diagnosis Date Noted  . Acute lower UTI 04/29/2018  . AKI (acute kidney injury) ([place])   . Other hydronephrosis   . Urinary retention   . ARF (acute renal failure) ([place]) 07/29/2017  . Anemia 03/09/2014  . Chronic kidney disease 03/09/2014  . Essential hypertension 03/09/2014    Past Surgical Hx History reviewed. No pertinent surgical history.  Medications Prior to Admission medications   Medication Sig Start Date End Date Taking? Authorizing Provider  acetaminophen (TYLENOL) 325 MG tablet Take 2 tablets (650 mg total) by mouth every 6 (six) hours as needed for mild pain (or Fever >/= 101). 04/30/18   Vaughan Basta, MD  allopurinol (ZYLOPRIM) 300 MG tablet Take 300 mg by mouth daily. 12/08/17   [provider]  amLODipine (NORVASC) 10 MG tablet Take 10 mg by mouth daily.    [provider]  aspirin EC 81 MG tablet Take 81 mg by mouth daily.    [provider]  carvedilol (COREG) 25 MG tablet  05/11/18   [provider]  cholecalciferol (VITAMIN D3) 25 MCG (1000 UT) tablet Take 1,000 Units by mouth daily.    [provider]  docusate sodium (COLACE) 100 MG capsule Take 1 capsule (100 mg total) by mouth 2 (two) times daily. 08/30/18 08/30/19  Lavonia Drafts, MD  gluconic acid-citric acid (RENACIDIN) irrigation Irrigate with 30 mLs as directed 2 (two) times a week. 04/14/19   Vaillancourt, Aldona Bar, PA-C  lovastatin (MEVACOR) 40 MG tablet Take 1 tablet by mouth every evening. 07/16/17   [provider]  polyethylene glycol (MIRALAX) 17 g packet Take 17 g by mouth daily. 08/30/18   Lavonia Drafts, MD    Allergies Ace inhibitors  Family Hx Family History  Problem Relation Age of Onset  . Cancer Father     Social Hx Social History   Tobacco Use  . Smoking status: Former Smoker    Types: Cigarettes    Quit date: 04/07/1998    Years since quitting: 21.0  . Smokeless tobacco: Never Used  Substance Use Topics  . Alcohol use: Not Currently  . Drug use: Never     Review of Systems  Constitutional: Negative for fever, chills. Eyes: Negative for visual changes. ENT: Negative for sore throat. Cardiovascular: Negative for chest pain. Respiratory: Negative for shortness of breath.  Gastrointestinal: Negative for nausea, vomiting.  Genitourinary: + hematuria, decreased UOP Musculoskeletal: Negative for leg swelling. Skin: Negative for rash. Neurological: Negative for for headaches.   Physical Exam  Vital Signs: ED Triage Vitals  Enc Vitals Group     BP 04/27/19 1938 138/79     Pulse Rate 04/27/19 1938 88     Resp 04/27/19 1938 18     Temp 04/27/19 1938 97.7 F (36.5 C)     Temp Source 04/27/19 1938 Oral     SpO2 04/27/19 1938 99 %     Weight  04/27/19 1940 150 lb (68 kg)     Height 04/27/19 1940 5\' 11"  (1.803 m)     Head Circumference --      Peak Flow --      Pain Score 04/27/19 1940 8     Pain Loc --      Pain Edu? --      Excl. in [place]? --     Constitutional: Alert and oriented.  Head: Normocephalic. Atraumatic. Eyes: Conjunctivae clear. Sclera anicteric. Nose: No congestion. No rhinorrhea. Mouth/Throat: Wearing mask.  Neck: No stridor.   Cardiovascular: Normal rate. Extremities well perfused. Respiratory: Normal respiratory effort.  Gastrointestinal: Soft. Non-tender. Non-distended.  Genitourinary: RN present. No penile swelling. Foley catheter appears malpositioned with gross hematuria in Foley bag.  No clots. Musculoskeletal: No lower extremity edema. No deformities. Neurologic:  Normal speech and language. No gross focal neurologic deficits are appreciated.  Skin: Skin is warm, dry and intact. No rash noted. Psychiatric: Mood and affect are appropriate for situation.    Radiology  CT: IMPRESSION:  1. Malpositioned Foley catheter with the bulb of the catheter  located within the penile urethra. There are multiple stones within  the patient's urethra. Pockets of gas surround the prosthetic  urethra are which may be related to traumatic retraction of the  Foley catheter.  2. Diffuse bladder wall thickening with adjacent fat stranding is  concerning for cystitis. Correlation with urinalysis is recommended.  3. Bilateral nephrolithiasis without evidence for hydronephrosis.  Multiple stones are noted within the urinary bladder.  4. There is cholelithiasis without secondary signs of acute  cholecystitis.  5. Significant prostatomegaly.  6. Aortic Atherosclerosis (ICD10-I70.0).    Procedures  Procedure(s) performed (including critical care):  Procedures   Initial Impression / Assessment and Plan / ED Course  74 y.o. male who presents to the ED for hematuria, decreased urine output after Foley catheter  exchanged earlier today.  Imaging reveals malpositioned Foley, bulb of catheter within the penile urethra.  Imaging also concerning for potential cystitis. UA limited due to hematuria. Creatinine near baseline.  Foley catheter removed and replaced without difficulty.  Now draining yellow urine.  Will monitor for approximately 30 minutes to ensure no clotting or issues, otherwise stable for discharge.  UA limited due to gross hematuria, but is notable for WBCs.  This, in the setting of his imaging findings, and malpositioned Foley, will opt to treat.  Foley has continued to drain appropriately.  Patient reports improvement in symptoms.  As such, will proceed with discharge as planned.  Family comfortable with the plan and discharge.   Final Clinical Impression(s) / ED Diagnosis  Final diagnoses:  Hematuria, unspecified type  Problem with Foley catheter, initial encounter [facility])       Note:  This document was prepared using Dragon voice recognition software and may include unintentional dictation errors.   Lilia Pro., MD 04/27/19 [phone]

## 2019-04-27 NOTE — ED Notes (Signed)
Provided update and discharge information to daughter by phone.

## 2019-04-27 NOTE — Discharge Instructions (Addendum)
Thank you for letting us take care of you in the emergency department today.  We have replaced your Foley catheter today.  Please continue to take any regular, prescribed medications.   New medications we have prescribed:  - Cefdinir - antibiotic for urine infection, to treat/prevent infection since your catheter was malpositioned.   Please follow up with: - Your urology doctor to review your ER visit and follow up on your symptoms.   Please return to the ER for any new or worsening symptoms.

## 2019-05-04 ENCOUNTER — Other Ambulatory Visit: Payer: Self-pay

## 2019-05-04 ENCOUNTER — Ambulatory Visit (INDEPENDENT_AMBULATORY_CARE_PROVIDER_SITE_OTHER): Payer: Medicare HMO | Admitting: Physician Assistant

## 2019-05-04 ENCOUNTER — Encounter: Payer: Self-pay | Admitting: Physician Assistant

## 2019-05-04 VITALS — BP 144/80 | HR 67 | Ht 71.0 in | Wt 150.0 lb

## 2019-05-04 DIAGNOSIS — R339 Retention of urine, unspecified: Secondary | ICD-10-CM | POA: Diagnosis not present

## 2019-05-04 DIAGNOSIS — R1084 Generalized abdominal pain: Secondary | ICD-10-CM

## 2019-05-04 NOTE — Progress Notes (Signed)
05/04/2019 3:17 PM   [place]. 05/10/1945 [id number]  CC: Abdominal pain  HPI: Troy Berry. is a 74 y.o. male who presents today for evaluation of abdominal pain. He is an established [facility] patient with an indwelling Foley catheter and a history of urinary encrustation catheter occlusion.  He recently was set up with home health for monthly catheter changes as well as biweekly vinegar versus Renacidin bladder installations.  He was seen in the ED over the weekend with a malpositioned Foley catheter with penile pain and bleeding; catheter was replaced and he was discharged on Omnicef 30 mg twice daily x10 days.  Today, he reports diffuse abdominal pain.  Wife reports she has been giving him polyethylene glycol supplementation for chronic constipation.  He has been having diarrhea.  He denies problems with his catheter.  He denies fever, chills, nausea, vomiting, and flank pain.  He was previously counseled to seek care with his PCP regarding his persistent complaints of abdominal pain.  He reports he did see his PCP last week, however he did not mention the abdominal pain during that visit.  His wife has been performing vinegar bladder installations every other day.  PMH: Past Medical History:  Diagnosis Date  . Anemia   . CKD (chronic kidney disease), stage III   . Gout   . Hypertension   . Urinary retention    in-dwelling Foley since Nov 2019, sees Dr. Hollice Espy    Surgical History: History reviewed. No pertinent surgical history.  Home Medications:  Allergies as of 05/04/2019      Reactions   Ace Inhibitors    Other reaction(s): Other (See Comments) lip swelling      Medication List       Accurate as of May 04, 2019  3:17 PM. If you have any questions, ask your nurse or doctor.        acetaminophen 325 MG tablet Commonly known as: TYLENOL Take 2 tablets (650 mg total) by mouth every 6 (six) hours as needed for mild pain (or Fever >/=  101).   allopurinol 300 MG tablet Commonly known as: ZYLOPRIM Take 300 mg by mouth daily.   amLODipine 10 MG tablet Commonly known as: NORVASC Take 10 mg by mouth daily.   aspirin EC 81 MG tablet Take 81 mg by mouth daily.   carvedilol 25 MG tablet Commonly known as: COREG Take 25 mg by mouth 2 (two) times daily with a meal.   cefdinir 300 MG capsule Commonly known as: OMNICEF Take 1 capsule (300 mg total) by mouth 2 (two) times daily for 10 days.   cholecalciferol 25 MCG (1000 UNIT) tablet Commonly known as: VITAMIN D3 Take 1,000 Units by mouth daily.   CRANBERRY-CALCIUM PO Take 1 capsule by mouth daily.   docusate sodium 100 MG capsule Commonly known as: Colace Take 1 capsule (100 mg total) by mouth 2 (two) times daily.   gluconic acid-citric acid irrigation Irrigate with 30 mLs as directed 2 (two) times a week.   lovastatin 40 MG tablet Commonly known as: MEVACOR Take 1 tablet by mouth every evening.   polyethylene glycol 17 g packet Commonly known as: MiraLax Take 17 g by mouth daily.       Allergies:  Allergies  Allergen Reactions  . Ace Inhibitors     Other reaction(s): Other (See Comments) lip swelling    Family History: Family History  Problem Relation Age of Onset  . Cancer Father  Social History:   reports that he quit smoking about 21 years ago. His smoking use included cigarettes. He has never used smokeless tobacco. He reports previous alcohol use. He reports that he does not use drugs.  ROS: UROLOGY Frequent Urination?: No Hard to postpone urination?: No Burning/pain with urination?: No Get up at night to urinate?: No Leakage of urine?: No Urine stream starts and stops?: No Trouble starting stream?: No Do you have to strain to urinate?: No Blood in urine?: No Urinary tract infection?: No Sexually transmitted disease?: No Injury to kidneys or bladder?: No Painful intercourse?: No Weak stream?: No Erection problems?:  No Penile pain?: No  Gastrointestinal Nausea?: No Vomiting?: No Indigestion/heartburn?: No Diarrhea?: No Constipation?: Yes  Constitutional Fever: No Night sweats?: No Weight loss?: No Fatigue?: No  Skin Skin rash/lesions?: No Itching?: No  Eyes Blurred vision?: No Double vision?: No  Ears/Nose/Throat Sore throat?: No Sinus problems?: No  Hematologic/Lymphatic Swollen glands?: No Easy bruising?: No  Cardiovascular Leg swelling?: No Chest pain?: No  Respiratory Cough?: No Shortness of breath?: No  Endocrine Excessive thirst?: No  Musculoskeletal Back pain?: No Joint pain?: No  Neurological Headaches?: No Dizziness?: No  Psychologic Depression?: No Anxiety?: No  Physical Exam: BP (!) 144/80   Pulse 67   Ht 5\' 11"  (1.803 m)   Wt 150 lb (68 kg)   BMI 20.92 kg/m   Constitutional:  Alert and oriented, no acute distress, nontoxic appearing HEENT: [place], AT Cardiovascular: No clubbing, cyanosis, or edema Respiratory: Normal respiratory effort, no increased work of breathing GI: Abdomen is soft, nondistended, diffusely tender GU: Foley catheter in place draining clear yellow urine Skin: No rashes, bruises or suspicious lesions Neurologic: Grossly intact, no focal deficits, moving all 4 extremities; requires standby assist Psychiatric: Normal mood and affect  Assessment & Plan:   1. Urinary retention Plan to continue monthly catheter changes and biweekly run a [place] bladder installations with home health as long as insurance will allow, then recommend wife continue with vinegar instillations every other day and that they continue with monthly catheter changes in our clinic.  2. Generalized abdominal pain Diffusely tender abdomen, on Omnicef following possible prostate versus urethral trauma with malpositioned Foley.  I do not believe this reflects a urologic problem at this time.  I counseled patient and his wife to follow-up with his PCP for  evaluation of abdominal pain, however I do suspect this is associated with chronic constipation as he has complained of the symptom previously.  They expressed understanding.  Return if symptoms worsen or fail to improve, for as needed *monthly cath change with home health.  Debroah Loop, PA-C  [facility] [address] [place], [place] [address] [phone]

## 2019-05-04 NOTE — Patient Instructions (Signed)
Continue with bladder instillation of Vinegar  Monthly cath change with home health, if home health is unable to continue exchange call our office to schedule monthly cath changes at [place] location

## 2019-05-06 NOTE — Telephone Encounter (Signed)
Please see my clinic note from 05/04/2019.  We have learned that patient lives 90 minutes away, so coming into clinic twice weekly for Renacidin installations is not feasible for them.  Troy Berry' wife has learned how to perform bladder installations at home.  We will plan to have him continue working with home health as long as their insurance will allow.  Thereafter, they will return for monthly catheter exchanges in our clinic versus [place] and his wife will perform vinegar bladder installations every other day.

## 2019-05-11 ENCOUNTER — Other Ambulatory Visit: Payer: Self-pay

## 2019-05-11 ENCOUNTER — Ambulatory Visit: Payer: Medicare HMO

## 2019-05-11 ENCOUNTER — Ambulatory Visit
Admission: RE | Admit: 2019-05-11 | Discharge: 2019-05-11 | Disposition: A | Discharge status: Home / Self Care | Payer: Medicare HMO | Source: Ambulatory Visit | Attending: Urology | Admitting: Urology

## 2019-05-11 DIAGNOSIS — N200 Calculus of kidney: Secondary | ICD-10-CM | POA: Diagnosis not present

## 2019-05-12 ENCOUNTER — Telehealth: Payer: Self-pay | Admitting: Family Medicine

## 2019-05-12 ENCOUNTER — Ambulatory Visit: Payer: Medicare HMO | Admitting: Physician Assistant

## 2019-05-12 NOTE — Telephone Encounter (Signed)
-----   Message from Nori Riis, PA-C sent at 05/12/2019  7:54 AM EST ----- Please let the [facility] know that the CT scan showed a small bladder stone and some sediment formation around the Foley catheter.  Is home health changing his catheters?

## 2019-05-12 NOTE — Telephone Encounter (Signed)
Tried reaching patient busy signal.

## 2019-05-13 NOTE — Telephone Encounter (Signed)
Patient's wife notified and voiced understanding. She states that he went to the ER post foley change on 2/20 and they are supposed to be coming back to change the catheter around 05/28/19.

## 2019-06-02 ENCOUNTER — Other Ambulatory Visit: Payer: Self-pay

## 2019-06-02 ENCOUNTER — Ambulatory Visit (INDEPENDENT_AMBULATORY_CARE_PROVIDER_SITE_OTHER): Payer: Medicare HMO | Admitting: Physician Assistant

## 2019-06-02 VITALS — BP 155/79 | HR 61

## 2019-06-02 DIAGNOSIS — R339 Retention of urine, unspecified: Secondary | ICD-10-CM | POA: Diagnosis not present

## 2019-06-02 NOTE — Progress Notes (Addendum)
Cath Change/ Replacement  Patient is present today for a catheter change due to urinary retention.  77ml of water was removed from the balloon, a 18FR coud foley cath was removed without difficulty.  Patient was cleaned and prepped in a sterile fashion with betadine and 2% lidocaine jelly was instilled into the urethra. A 16 FR coud foley cath was replaced into the bladder no complications were noted Urine return was noted 88ml and urine was yellow in color. The balloon was filled with 59ml of sterile water. A night bag was attached for drainage.  3 additional night bags were also given to the patient and patient was given instruction on how to change from one bag to another. Patient was given proper instruction on catheter care.    Performed by: Debroah Loop, PA-C   Additional Notes: Patient continues to receive home health services with Renacidin installations twice weekly.  He has not had any problems with catheter occlusion and reports satisfaction with home health care.  He is here for catheter exchange today because home health did not receive unexpected catheter supplies shipment in time for his monthly exchange.  Follow up: 1 month catheter exchange unless able to be performed by home health.  I spent 10 minutes on the day of the encounter to include pre-visit record review, face-to-face time with the patient, and post-visit ordering of tests.  Debroah Loop, PA-C 06/02/19 11:44 AM

## 2019-06-02 NOTE — Addendum Note (Signed)
Addended by: Mickle Plumb on: 06/02/2019 11:44 AM   Modules accepted: Level of Service

## 2019-06-25 ENCOUNTER — Other Ambulatory Visit: Payer: Self-pay

## 2019-06-25 ENCOUNTER — Emergency Department
Admission: EM | Admit: 2019-06-25 | Discharge: 2019-06-25 | Disposition: A | Discharge status: Home / Self Care | Payer: Medicare HMO | Attending: Emergency Medicine | Admitting: Emergency Medicine

## 2019-06-25 DIAGNOSIS — N390 Urinary tract infection, site not specified: Secondary | ICD-10-CM | POA: Diagnosis not present

## 2019-06-25 DIAGNOSIS — I129 Hypertensive chronic kidney disease with stage 1 through stage 4 chronic kidney disease, or unspecified chronic kidney disease: Secondary | ICD-10-CM | POA: Diagnosis not present

## 2019-06-25 DIAGNOSIS — Z79899 Other long term (current) drug therapy: Secondary | ICD-10-CM | POA: Diagnosis not present

## 2019-06-25 DIAGNOSIS — N183 Chronic kidney disease, stage 3 unspecified: Secondary | ICD-10-CM | POA: Diagnosis not present

## 2019-06-25 DIAGNOSIS — R339 Retention of urine, unspecified: Secondary | ICD-10-CM

## 2019-06-25 DIAGNOSIS — Z7982 Long term (current) use of aspirin: Secondary | ICD-10-CM | POA: Diagnosis not present

## 2019-06-25 DIAGNOSIS — Z87891 Personal history of nicotine dependence: Secondary | ICD-10-CM | POA: Diagnosis not present

## 2019-06-25 LAB — CBC WITH DIFFERENTIAL/PLATELET
Abs Immature Granulocytes: 0.03 10*3/uL (ref 0.00–0.07)
Basophils Absolute: 0 10*3/uL (ref 0.0–0.1)
Basophils Relative: 1 %
Eosinophils Absolute: 0.2 10*3/uL (ref 0.0–0.5)
Eosinophils Relative: 3 %
HCT: 33.5 % — ABNORMAL LOW (ref 39.0–52.0)
Hemoglobin: 10.9 g/dL — ABNORMAL LOW (ref 13.0–17.0)
Immature Granulocytes: 0 %
Lymphocytes Relative: 6 %
Lymphs Abs: 0.5 10*3/uL — ABNORMAL LOW (ref 0.7–4.0)
MCH: 26.3 pg (ref 26.0–34.0)
MCHC: 32.5 g/dL (ref 30.0–36.0)
MCV: 80.7 fL (ref 80.0–100.0)
Monocytes Absolute: 0.5 10*3/uL (ref 0.1–1.0)
Monocytes Relative: 7 %
Neutro Abs: 6.2 10*3/uL (ref 1.7–7.7)
Neutrophils Relative %: 83 %
Platelets: 166 10*3/uL (ref 150–400)
RBC: 4.15 MIL/uL — ABNORMAL LOW (ref 4.22–5.81)
RDW: 14.8 % (ref 11.5–15.5)
WBC: 7.4 10*3/uL (ref 4.0–10.5)
nRBC: 0 % (ref 0.0–0.2)

## 2019-06-25 LAB — URINALYSIS, COMPLETE (UACMP) WITH MICROSCOPIC
Bilirubin Urine: NEGATIVE
Glucose, UA: NEGATIVE mg/dL
Ketones, ur: NEGATIVE mg/dL
Nitrite: POSITIVE — AB
Protein, ur: 100 mg/dL — AB
RBC / HPF: >50 RBC/hpf — ABNORMAL HIGH (ref 0–5)
Specific Gravity, Urine: 1.009 (ref 1.005–1.030)
Squamous Epithelial / HPF: NONE SEEN (ref 0–5)
WBC, UA: >50 WBC/hpf — ABNORMAL HIGH (ref 0–5)
pH: 8 (ref 5.0–8.0)

## 2019-06-25 LAB — COMPREHENSIVE METABOLIC PANEL
ALT: 8 U/L (ref 0–44)
AST: 12 U/L — ABNORMAL LOW (ref 15–41)
Albumin: 4 g/dL (ref 3.5–5.0)
Alkaline Phosphatase: 54 U/L (ref 38–126)
Anion gap: 9 (ref 5–15)
BUN: 35 mg/dL — ABNORMAL HIGH (ref 8–23)
CO2: 24 mmol/L (ref 22–32)
Calcium: 10.5 mg/dL — ABNORMAL HIGH (ref 8.9–10.3)
Chloride: 100 mmol/L (ref 98–111)
Creatinine, Ser: 2.51 mg/dL — ABNORMAL HIGH (ref 0.61–1.24)
GFR calc Af Amer: 28 mL/min — ABNORMAL LOW (ref >60)
GFR calc non Af Amer: 24 mL/min — ABNORMAL LOW (ref >60)
Glucose, Bld: 115 mg/dL — ABNORMAL HIGH (ref 70–99)
Potassium: 3.7 mmol/L (ref 3.5–5.1)
Sodium: 133 mmol/L — ABNORMAL LOW (ref 135–145)
Total Bilirubin: 0.7 mg/dL (ref 0.3–1.2)
Total Protein: 7.1 g/dL (ref 6.5–8.1)

## 2019-06-25 MED ORDER — SODIUM CHLORIDE 0.9 % IV SOLN
1.0000 g | Freq: Once | INTRAVENOUS | Status: AC
  Administered 2019-06-25: 1 g via INTRAVENOUS
  Filled 2019-06-25: qty 10

## 2019-06-25 MED ORDER — CEPHALEXIN 250 MG PO CAPS: 250.0000 mg | ORAL_CAPSULE | Freq: Three times a day (TID) | ORAL | 0 refills | Status: DC

## 2019-06-25 NOTE — ED Notes (Signed)
Pt updated on delay. Pt verbalizes understanding. Foley continues to drain cloudy bloody urine. Pt reports feeling improved.

## 2019-06-25 NOTE — ED Notes (Addendum)
Pt cleansed of large amount of brown stool in triage one.

## 2019-06-25 NOTE — ED Triage Notes (Signed)
Pt with urinary foley in place that had not been draining. Pt states does have bladder pain. Cloudy small amount of yellow urine noted in drainage bag that per spouse via ems has been present since yesterday am.

## 2019-06-25 NOTE — ED Notes (Addendum)
Bladder scan performed with 630mL in bladder. Old catheter pulled, tip clogged with sediment.

## 2019-06-25 NOTE — ED Notes (Signed)
ED Provider at bedside. 

## 2019-06-25 NOTE — ED Provider Notes (Signed)
[facility] Emergency Department Provider Note  Time seen: 7:15 AM  I have reviewed the triage vital signs and the nursing notes.   HISTORY  Chief Complaint Urinary Retention   HPI Troy Berry. is a 74 y.o. male with a past medical history of anemia, CKD, hypertension, chronic indwelling catheter, presents to the emergency department for lower abdominal discomfort and a nondraining catheter.  Per patient and report urine has not been draining from the patient's chronic indwelling Foley catheter and the urine that has drained appears cloudy which is new as of yesterday.  Here the patient is calm cooperative, no acute distress.  States some lower abdominal pain.  Past Medical History:  Diagnosis Date  . Anemia   . CKD (chronic kidney disease), stage III   . Gout   . Hypertension   . Urinary retention    in-dwelling Foley since Nov 2019, sees Dr. Hollice Espy    Patient Active Problem List   Diagnosis Date Noted  . Acute lower UTI 04/29/2018  . AKI (acute kidney injury) ([place])   . Other hydronephrosis   . Urinary retention   . ARF (acute renal failure) ([place]) 07/29/2017  . Anemia 03/09/2014  . Chronic kidney disease 03/09/2014  . Essential hypertension 03/09/2014    No past surgical history on file.  Prior to Admission medications   Medication Sig Start Date End Date Taking? Authorizing Provider  acetaminophen (TYLENOL) 325 MG tablet Take 2 tablets (650 mg total) by mouth every 6 (six) hours as needed for mild pain (or Fever >/= 101). 04/30/18   Vaughan Basta, MD  allopurinol (ZYLOPRIM) 300 MG tablet Take 300 mg by mouth daily. 12/08/17   [provider]  amLODipine (NORVASC) 10 MG tablet Take 10 mg by mouth daily.    [provider]  aspirin EC 81 MG tablet Take 81 mg by mouth daily.    [provider]  carvedilol (COREG) 25 MG tablet Take 25 mg by mouth 2 (two) times daily with a meal.  05/11/18   [provider]  cholecalciferol (VITAMIN D3) 25 MCG (1000 UT) tablet Take 1,000 Units by mouth daily.    [provider]  CRANBERRY-CALCIUM PO Take 1 capsule by mouth daily.    [provider]  docusate sodium (COLACE) 100 MG capsule Take 1 capsule (100 mg total) by mouth 2 (two) times daily. 08/30/18 08/30/19  Lavonia Drafts, MD  gluconic acid-citric acid (RENACIDIN) irrigation Irrigate with 30 mLs as directed 2 (two) times a week. 04/14/19   Vaillancourt, Aldona Bar, PA-C  lovastatin (MEVACOR) 40 MG tablet Take 1 tablet by mouth every evening. 07/16/17   [provider]  polyethylene glycol (MIRALAX) 17 g packet Take 17 g by mouth daily. 08/30/18   Lavonia Drafts, MD    Allergies  Allergen Reactions  . Ace Inhibitors     Other reaction(s): Other (See Comments) lip swelling    Family History  Problem Relation Age of Onset  . Cancer Father     Social History Social History   Tobacco Use  . Smoking status: Former Smoker    Types: Cigarettes    Quit date: 04/07/1998    Years since quitting: 21.2  . Smokeless tobacco: Never Used  Substance Use Topics  . Alcohol use: Not Currently  . Drug use: Never    Review of Systems Constitutional: Negative for fever. Cardiovascular: Negative for chest pain. Respiratory: Negative for shortness of breath. Gastrointestinal: Lower abdominal pain Genitourinary:  Cloudy urine, catheter not draining Musculoskeletal: Negative for musculoskeletal complaints All other ROS negative  ____________________________________________   PHYSICAL EXAM:  VITAL SIGNS: ED Triage Vitals  Enc Vitals Group     BP 06/25/19 0259 (!) 187/106     Pulse Rate 06/25/19 0259 89     Resp 06/25/19 0259 16     Temp 06/25/19 0259 98.4 F (36.9 C)     Temp Source 06/25/19 0259 Oral     SpO2 06/25/19 0259 98 %     Weight 06/25/19 0300 150 lb (68 kg)     Height 06/25/19 0300 5\' 10"  (1.778 m)     Head Circumference --      Peak Flow --      Pain  Score 06/25/19 0300 10     Pain Loc --      Pain Edu? --      Excl. in [place]? --    Constitutional: Patient is awake and alert, no acute distress.  Lying in bed comfortably. Eyes: Normal exam ENT      Head: Normocephalic and atraumatic.      Mouth/Throat: Mucous membranes are moist. Cardiovascular: Normal rate, regular rhythm. Respiratory: Normal respiratory effort without tachypnea nor retractions. Breath sounds are clear  Gastrointestinal: Soft, mild lower abdominal tenderness, no rebound guarding or distention. Musculoskeletal: Moves extremities well. Neurologic: Moves extremities, answers questions. Skin:  Skin is warm, dry Psychiatric: Mood and affect are normal  ____________________________________________   INITIAL IMPRESSION / ASSESSMENT AND PLAN / ED COURSE  Pertinent labs & imaging results that were available during my care of the patient were reviewed by me and considered in my medical decision making (see chart for details).   Patient presents to the emergency department for a nondraining Foley catheter with cloudy urine.  Does state lower abdominal pain, mild reaction to palpation in the lower abdomen.  Catheter was exchanged and is draining appropriately.  Urine sample appears consistent with a urinary tract infection, it is a chronic indwelling catheter however given his complaint of lower abdominal discomfort we will treat with antibiotics for presumed UTI.  We will send a urine culture.  Patient follows up with Dr. Erlene Quan.  Patient's lab work is overall nonrevealing.  Reassuringly normal white blood cell count.  Chronic renal insufficiency unchanged from baseline.  We will place the patient on Rocephin.  Likely discharge with oral antibiotics.  Patient's wife is now here with the patient as well.  We will discharge with Keflex.  Urine culture has been sent.  Wife and patient agreeable to plan of care.  Troy Berry. was evaluated in Emergency Department on 06/25/2019  for the symptoms described in the history of present illness. He was evaluated in the context of the global COVID-19 pandemic, which necessitated consideration that the patient might be at risk for infection with the SARS-CoV-2 virus that causes COVID-19. Institutional protocols and algorithms that pertain to the evaluation of patients at risk for COVID-19 are in a state of rapid change based on information released by regulatory bodies including the CDC and federal and state organizations. These policies and algorithms were followed during the patient's care in the ED.  ____________________________________________   FINAL CLINICAL IMPRESSION(S) / ED DIAGNOSES  Urinary tract infection   Harvest Dark, MD 06/25/19 [phone]

## 2019-06-27 LAB — URINE CULTURE: Culture: >=100000 — AB

## 2019-06-28 NOTE — Consult Note (Signed)
Called patient's phone number and wife picked up. I informed her about urine culture results. Informed her to stop taking cephalexin and pick up new antibiotic from [facility] and start that ASAP. Will call in Augmentin 500 mg BID x 7 days due to renal function. Discussed case with Dr. Jari Pigg.   Thanks, Eleonore Chiquito, PharmD, BCPS

## 2019-06-30 ENCOUNTER — Ambulatory Visit: Payer: Medicare HMO | Admitting: Surgery

## 2019-06-30 ENCOUNTER — Ambulatory Visit: Payer: Medicare HMO | Admitting: Physician Assistant

## 2019-06-30 ENCOUNTER — Encounter: Payer: Self-pay | Admitting: Surgery

## 2019-06-30 ENCOUNTER — Other Ambulatory Visit: Payer: Self-pay

## 2019-06-30 VITALS — BP 162/79 | HR 62 | Temp 97.7°F | Resp 14 | Ht 71.0 in | Wt 168.0 lb

## 2019-06-30 DIAGNOSIS — R339 Retention of urine, unspecified: Secondary | ICD-10-CM | POA: Diagnosis not present

## 2019-06-30 NOTE — Patient Instructions (Signed)
If Urology decides to preform surgery on you in the future we may be able to coordinate surgery for you.   Follow-up with our office as needed.  Please call and ask to speak with a nurse if you develop questions or concerns.   Inguinal Hernia, Adult An inguinal hernia is when fat or your intestines push through a weak spot in a muscle where your leg meets your lower belly (groin). This causes a rounded lump (bulge). This kind of hernia could also be:  In your scrotum, if you are male.  In folds of skin around your vagina, if you are male. There are three types of inguinal hernias. These include:  Hernias that can be pushed back into the belly (are reducible). This type rarely causes pain.  Hernias that cannot be pushed back into the belly (are incarcerated).  Hernias that cannot be pushed back into the belly and lose their blood supply (are strangulated). This type needs emergency surgery. If you do not have symptoms, you may not need treatment. If you have symptoms or a large hernia, you may need surgery. Follow these instructions at home: Lifestyle  Do these things if told by your doctor so you do not have trouble pooping (constipation): ? Drink enough fluid to keep your pee (urine) pale yellow. ? Eat foods that have a lot of fiber. These include fresh fruits and vegetables, whole grains, and beans. ? Limit foods that are high in fat and processed sugars. These include foods that are fried or sweet. ? Take medicine for trouble pooping.  Avoid lifting heavy objects.  Avoid standing for long amounts of time.  Do not use any products that contain nicotine or tobacco. These include cigarettes and e-cigarettes. If you need help quitting, ask your doctor.  Stay at a healthy weight. General instructions  You may try to push your hernia in by very gently pressing on it when you are lying down. Do not try to force the bulge back in if it will not push in easily.  Watch your hernia  for any changes in shape, size, or color. Tell your doctor if you see any changes.  Take over-the-counter and prescription medicines only as told by your doctor.  Keep all follow-up visits as told by your doctor. This is important. Contact a doctor if:  You have a fever.  You have new symptoms.  Your symptoms get worse. Get help right away if:  The area where your leg meets your lower belly has: ? Pain that gets worse suddenly. ? A bulge that gets bigger suddenly, and it does not get smaller after that. ? A bulge that turns red or purple. ? A bulge that is painful when you touch it.  You are a man, and your scrotum: ? Suddenly feels painful. ? Suddenly changes in size.  You cannot push the hernia in by very gently pressing on it when you are lying down. Do not try to force the bulge back in if it will not push in easily.  You feel sick to your stomach (nauseous), and that feeling does not go away.  You throw up (vomit), and that keeps happening.  You have a fast heartbeat.  You cannot poop (have a bowel movement) or pass gas. These symptoms may be an emergency. Do not wait to see if the symptoms will go away. Get medical help right away. Call your local emergency services (911 in the [place] Summary  An inguinal hernia is when fat  or your intestines push through a weak spot in a muscle where your leg meets your lower belly (groin). This causes a rounded lump (bulge).  If you do not have symptoms, you may not need treatment. If you have symptoms or a large hernia, you may need surgery.  Avoid lifting heavy objects. Also avoid standing for long amounts of time.  Do not try to force the bulge back in if it will not push in easily. This information is not intended to replace advice given to you by your health care provider. Make sure you discuss any questions you have with your health care provider. Document Revised: 04/25/2017 Document Reviewed: 12/24/2016 Elsevier Patient  Education  [place].

## 2019-06-30 NOTE — Progress Notes (Signed)
Patient ID: Troy Beals., male   DOB: 1945-04-25, 74 y.o.   MRN: [id number]  Chief Complaint:   Right groin hernia  History of Present Illness Troy Berry. is a 74 y.o. male with a longstanding history of intermittent bulging in the right groin last seen at this office approximately a year ago.  Reports pain with activity, however in all honesty this patient is not very active being transported by wheelchair and taking 30 seconds or more just to go from a supine to an upright position.  He denies fevers and chills.  He has no no nausea or vomiting.  He has a history of urinary retention and has a chronic indwelling catheter and has had recent treatments for urinary tract infections.  He also has MiraLAX or its equivalent on a chronic basis for chronic constipation.  Past Medical History Past Medical History:  Diagnosis Date  . Anemia   . CKD (chronic kidney disease), stage III   . Gout   . Hypertension   . Urinary retention    in-dwelling Foley since Nov 2019, sees Dr. Hollice Espy      No past surgical history on file.  Allergies  Allergen Reactions  . Ace Inhibitors     Other reaction(s): Other (See Comments) lip swelling    Current Outpatient Medications  Medication Sig Dispense Refill  . acetaminophen (TYLENOL) 325 MG tablet Take 2 tablets (650 mg total) by mouth every 6 (six) hours as needed for mild pain (or Fever >/= 101). 10 tablet 0  . allopurinol (ZYLOPRIM) 300 MG tablet Take 300 mg by mouth daily.  5  . amLODipine (NORVASC) 10 MG tablet Take 10 mg by mouth daily.    Marland Kitchen amoxicillin-clavulanate (AUGMENTIN) 500-125 MG tablet Take 1 tablet by mouth 2 (two) times daily.    Marland Kitchen aspirin EC 81 MG tablet Take 81 mg by mouth daily.    . carvedilol (COREG) 25 MG tablet Take 25 mg by mouth 2 (two) times daily with a meal.     . cholecalciferol (VITAMIN D3) 25 MCG (1000 UT) tablet Take 1,000 Units by mouth daily.    Marland Kitchen CRANBERRY-CALCIUM PO Take 1 capsule by mouth daily.     Marland Kitchen docusate sodium (COLACE) 100 MG capsule Take 1 capsule (100 mg total) by mouth 2 (two) times daily. 60 capsule 2  . gluconic acid-citric acid (RENACIDIN) irrigation Irrigate with 30 mLs as directed 2 (two) times a week. 240 mL 5  . lovastatin (MEVACOR) 40 MG tablet Take 1 tablet by mouth every evening.    . polyethylene glycol (MIRALAX) 17 g packet Take 17 g by mouth daily. 14 each 0   No current facility-administered medications for this visit.    Family History Family History  Problem Relation Age of Onset  . Cancer Father       Social History Social History   Tobacco Use  . Smoking status: Former Smoker    Types: Cigarettes    Quit date: 04/07/1998    Years since quitting: 21.2  . Smokeless tobacco: Never Used  Substance Use Topics  . Alcohol use: Not Currently  . Drug use: Never        Review of Systems  Constitutional: Negative.   HENT: Negative.   Eyes: Negative.   Respiratory: Negative.   Cardiovascular: Negative.   Gastrointestinal: Positive for constipation.  Musculoskeletal: Negative.   Skin: Negative.   Neurological: Negative.   Endo/Heme/Allergies: Negative.  Physical Exam Blood pressure (!) 162/79, pulse 62, temperature 97.7 F (36.5 C), resp. rate 14, height 5\' 11"  (1.803 m), weight 168 lb (76.2 kg), SpO2 100 %. Last Weight  Most recent update: 06/30/2019  2:33 PM   Weight  76.2 kg (168 lb)            CONSTITUTIONAL: This gentleman fits the definition of frail. EYES: Sclera non-icteric.   EARS, NOSE, MOUTH AND THROAT: Mask worn. Hearing is intact to voice.  RESPIRATORY:  Lungs are clear, and breath sounds are equal bilaterally. Normal respiratory effort without pathologic use of accessory muscles. CARDIOVASCULAR: Heart is regular in rate and rhythm. GI: The abdomen is soft, nontender, and nondistended.  Scaphoid with marked evidence of weight loss. GU: I sense there may be an inguinal defect, but his cough is so weak that he does not  produce an appreciable bulge.  Indwelling Foley catheter in place.   SKIN: Skin turgor is normal. No pathologic skin lesions appreciated.  NEUROLOGIC: Slow to speak, alert and responsive.  I have a hard time understanding what he saying.   Data Reviewed I have personally reviewed what is currently available of the patient's imaging, recent labs and medical records.   Labs:  CBC Latest Ref Rng & Units 06/25/2019 04/27/2019 01/31/2019  WBC 4.0 - 10.5 K/uL 7.4 8.3 8.3  Hemoglobin 13.0 - 17.0 g/dL 10.9(L) 11.3(L) 12.1(L)  Hematocrit 39.0 - 52.0 % 33.5(L) 34.4(L) 35.8(L)  Platelets 150 - 400 K/uL 166 188 173   CMP Latest Ref Rng & Units 06/25/2019 04/27/2019 01/31/2019  Glucose 70 - 99 mg/dL 115(H) 85 128(H)  BUN 8 - 23 mg/dL 35(H) 35(H) 37(H)  Creatinine 0.61 - 1.24 mg/dL 2.51(H) 2.41(H) 2.76(H)  Sodium 135 - 145 mmol/L 133(L) 135 135  Potassium 3.5 - 5.1 mmol/L 3.7 3.6 3.5  Chloride 98 - 111 mmol/L 100 103 100  CO2 22 - 32 mmol/L 24 24 21(L)  Calcium 8.9 - 10.3 mg/dL 10.5(H) 10.6(H) 11.5(H)  Total Protein 6.5 - 8.1 g/dL 7.1 7.1 7.7  Total Bilirubin 0.3 - 1.2 mg/dL 0.7 0.9 0.9  Alkaline Phos 38 - 126 U/L 54 44 46  AST 15 - 41 U/L 12(L) 12(L) 17  ALT 0 - 44 U/L 8 6 11       Imaging: Radiology review:  EXAM: CT ABDOMEN AND PELVIS WITHOUT CONTRAST  TECHNIQUE: Multidetector CT imaging of the abdomen and pelvis was performed following the standard protocol without IV contrast.  COMPARISON:  04/27/2019  FINDINGS: Lower chest: Chronic interstitial changes.  Hepatobiliary: No focal abnormality in the liver on this study without intravenous contrast. Numerous tiny layering gallstones evident. No intrahepatic or extrahepatic biliary dilation.  Pancreas: No focal mass lesion. No dilatation of the main duct. No intraparenchymal cyst. No peripancreatic edema.  Spleen: No splenomegaly. No focal mass lesion.  Adrenals/Urinary Tract: No adrenal nodule or mass. No stones are seen  in the right kidney. There may be a 3 mm nonobstructing stone in the upper pole left kidney although this could be an arterial calcification. No other definite left renal stone. Similar appearance of the 3.4 cm exophytic left renal cyst. A second 2.5 cm lesion in the interpolar left renal cortex has attenuation higher than would be expected for a simple cyst but is stable in the interval. 2.0 cm cyst in the upper pole left kidney also appears unchanged. No evidence for ureteral stone. Foley catheter identified in the bladder lumen with 2 x 4 mm stone identified posterior  to the Foley balloon.  Stomach/Bowel: Stomach is unremarkable. No gastric wall thickening. No evidence of outlet obstruction. Duodenum is normally positioned as is the ligament of Treitz. No small bowel wall thickening. No small bowel dilatation. The terminal ileum is normal. The appendix is normal. No gross colonic mass. No colonic wall thickening. Diverticular changes are noted in the left colon without evidence of diverticulitis.  Vascular/Lymphatic: There is abdominal aortic atherosclerosis without aneurysm. There is no gastrohepatic or hepatoduodenal ligament lymphadenopathy. No retroperitoneal or mesenteric lymphadenopathy. Upper normal left common iliac node seen on images 45 and 46 of series 2 are stable and measure up to 9 mm short axis. Stable 7 mm short axis right pelvic sidewall lymph node.  Reproductive: Prostate gland appears enlarged.  Other: No intraperitoneal free fluid.  Musculoskeletal: Similar appearance of the heterogeneous bony mineralization, potentially related to the history of chronic kidney disease. Although some small focal lucencies are noted, these are stable (posterior left eleventh rib on 11/2, posterior left L2 vertebral body on 34/2, anterior right iliac bone on 57/2)).  IMPRESSION: 1. Interval repositioning of the Foley catheter with 2 x 4 mm stone identified posterior to  the Foley balloon in the bladder lumen. 2. 3 mm nonobstructing stone upper pole left kidney versus arterial calcification. 3. Stable left low-density renal lesions, likely cysts. 4. Cholelithiasis. 5. Prostatomegaly. 6. Stable upper normal extraperitoneal lymph nodes in the pelvis. 7.  Aortic Atherosclerois (ICD10-170.0)   Electronically Signed   By: Misty Stanley M.D.   On: 05/11/2019 13:50 Within last 24 hrs: No results found.  Assessment    Possible/probable right inguinal hernia with spontaneous reduction, no evidence of nor history of incarceration. Patient Active Problem List   Diagnosis Date Noted  . Acute lower UTI 04/29/2018  . AKI (acute kidney injury) ([place])   . Other hydronephrosis   . Urinary retention   . ARF (acute renal failure) ([place]) 07/29/2017  . Anemia 03/09/2014  . Chronic kidney disease 03/09/2014  . Essential hypertension 03/09/2014    Plan    We discussed the risks of anesthetic, elective surgery, the matter of fact that he continues to wear an indwelling catheter for over a year now.  His ability to tolerate surgery. I believe we might be able to piggyback on should other issues require an anesthetic, but for the hernia repair alone I believe he is at low risk for developing an incarceration or strangulation.  I believe the risk itself of repair is greater than the risk of deferring and observing for those reasons.  Face-to-face time spent with the patient and accompanying care providers(if present) was 25 minutes, with more than 50% of the time spent counseling, educating, and coordinating care of the patient.      Ronny Bacon M.D., FACS 06/30/2019, 3:00 PM

## 2019-07-07 ENCOUNTER — Telehealth: Payer: Self-pay | Admitting: *Deleted

## 2019-07-07 NOTE — Telephone Encounter (Signed)
Received VM on triage line from [phone] regarding home health catheter changes. Returned call and left VM.

## 2019-07-22 ENCOUNTER — Other Ambulatory Visit: Payer: Self-pay

## 2019-07-22 ENCOUNTER — Ambulatory Visit: Payer: Medicare HMO | Admitting: Physician Assistant

## 2019-07-22 ENCOUNTER — Encounter: Payer: Self-pay | Admitting: Physician Assistant

## 2019-07-22 VITALS — BP 152/70

## 2019-07-22 DIAGNOSIS — R339 Retention of urine, unspecified: Secondary | ICD-10-CM | POA: Diagnosis not present

## 2019-07-22 NOTE — Patient Instructions (Addendum)
Indwelling Urinary Catheter Care, Adult An indwelling urinary catheter is a thin tube that is put into your bladder. The tube helps to drain pee (urine) out of your body. The tube goes in through your urethra. Your urethra is where pee comes out of your body. Your pee will come out through the catheter, then it will go into a bag (drainage bag). Take good care of your catheter so it will work well. How to wear your catheter and bag Supplies needed  Sticky tape (adhesive tape) or a leg strap.  Alcohol wipe or soap and water (if you use tape).  A clean towel (if you use tape).  Large overnight bag. Wearing your catheter Attach your catheter to your leg with tape or a leg strap.  Make sure the catheter is not pulled tight.  If a leg strap gets wet, take it off and put on a dry strap.  If you use tape to hold the bag on your leg: 1. Use an alcohol wipe or soap and water to wash your skin where the tape made it sticky before. 2. Use a clean towel to pat-dry that skin. 3. Use new tape to make the bag stay on your leg. Wearing your bags You should have been given a large overnight bag.  You may wear the overnight bag in the day or night.  Always have the overnight bag lower than your bladder.  Do not let the bag touch the floor.  Before you go to sleep, put a clean plastic bag in a wastebasket. Then hang the overnight bag inside the wastebasket. How to care for your skin and catheter Supplies needed  A clean washcloth.  Water and mild soap.  A clean towel. Caring for your skin and catheter      Clean the skin around your catheter every day: 1. Wash your hands with soap and water. 2. Wet a clean washcloth in warm water and mild soap. 3. Clean the skin around your urethra.  If you are male:  Gently spread the folds of skin around your vagina (labia).  With the washcloth in your other hand, wipe the inner side of your labia on each side. Wipe from front to back.  If  you are male:  Pull back any skin that covers the end of your penis (foreskin).  With the washcloth in your other hand, wipe your penis in small circles. Start wiping at the tip of your penis, then move away from the catheter.  Move the foreskin back in place, if needed. 4. With your free hand, hold the catheter close to where it goes into your body.  Keep holding the catheter during cleaning so it does not get pulled out. 5. With the washcloth in your other hand, clean the catheter.  Only wipe downward on the catheter.  Do not wipe upward toward your body. Doing this may push germs into your urethra and cause infection. 6. Use a clean towel to pat-dry the catheter and the skin around it. Make sure to wipe off all soap. 7. Wash your hands with soap and water.  Shower every day. Do not take baths.  Do not use cream, ointment, or lotion on the area where the catheter goes into your body, unless your doctor tells you to.  Do not use powders, sprays, or lotions on your genital area.  Check your skin around the catheter every day for signs of infection. Check for: ? Redness, swelling, or pain. ? Fluid or  blood. ? Warmth. ? Pus or a bad smell. How to empty the bag Supplies needed  Rubbing alcohol.  Gauze pad or cotton ball.  Tape or a leg strap. Emptying the bag Pour the pee out of your bag when it is ?- full, or at least 2-3 times a day. Do this for your overnight bag and your leg bag. 1. Wash your hands with soap and water. 2. Separate (detach) the bag from your leg. 3. Hold the bag over the toilet or a clean pail. Keep the bag lower than your hips and bladder. This is so the pee (urine) does not go back into the tube. 4. Open the pour spout. It is at the bottom of the bag. 5. Empty the pee into the toilet or pail. Do not let the pour spout touch any surface. 6. Put rubbing alcohol on a gauze pad or cotton ball. 7. Use the gauze pad or cotton ball to clean the pour spout.  8. Close the pour spout. 9. Attach the bag to your leg with tape or a leg strap. 10. Wash your hands with soap and water. Follow instructions for cleaning the drainage bag:  From the product maker.  As told by your doctor. How to change the bag Supplies needed  Alcohol wipes.  A clean bag.  Tape or a leg strap. Changing the bag Replace your bag when it starts to leak, smell bad, or look dirty. 1. Wash your hands with soap and water. 2. Separate the dirty bag from your leg. 3. Pinch the catheter with your fingers so that pee does not spill out. 4. Separate the catheter tube from the bag tube where these tubes connect (at the connection valve). Do not let the tubes touch any surface. 5. Clean the end of the catheter tube with an alcohol wipe. Use a different alcohol wipe to clean the end of the bag tube. 6. Connect the catheter tube to the tube of the clean bag. 7. Attach the clean bag to your leg with tape or a leg strap. Do not make the bag tight on your leg. 8. Wash your hands with soap and water. General rules   Never pull on your catheter. Never try to take it out. Doing that can hurt you.  Always wash your hands before and after you touch your catheter or bag. Use a mild, fragrance-free soap. If you do not have soap and water, use hand sanitizer.  Always make sure there are no twists or bends (kinks) in the catheter tube.  Always make sure there are no leaks in the catheter or bag.  Drink enough fluid to keep your pee pale yellow.  Do not take baths, swim, or use a hot tub.  If you are male, wipe from front to back after you poop (have a bowel movement). Contact a doctor if:  Your pee is cloudy.  Your pee smells worse than usual.  Your catheter gets clogged.  Your catheter leaks.  Your bladder feels full. Get help right away if:  You have redness, swelling, or pain where the catheter goes into your body.  You have fluid, blood, pus, or a bad smell  coming from the area where the catheter goes into your body.  Your skin feels warm where the catheter goes into your body.  You have a fever.  You have pain in your: ? Belly (abdomen). ? Legs. ? Lower back. ? Bladder.  You see blood in the catheter.  Your  pee is pink or red.  You feel sick to your stomach (nauseous).  You throw up (vomit).  You have chills.  Your pee is not draining into the bag.  Your catheter gets pulled out. Summary  An indwelling urinary catheter is a thin tube that is placed into the bladder to help drain pee (urine) out of the body.  The catheter is placed into the part of the body that drains pee from the bladder (urethra).  Taking good care of your catheter will keep it working properly and help prevent problems.  Always wash your hands before and after touching your catheter or bag.  Never pull on your catheter or try to take it out. This information is not intended to replace advice given to you by your health care provider. Make sure you discuss any questions you have with your health care provider. Document Revised: 07/16/2018 Document Reviewed: 11/07/2016 Elsevier Patient Education  [employer].

## 2019-07-22 NOTE — Progress Notes (Signed)
Cath Change/ Replacement  Patient is present today for a catheter change due to urinary retention.  92ml of water was removed from the balloon, a 14FR coud foley cath was removed without difficulty.  Patient was cleaned and prepped in a sterile fashion with betadine and 2% lidocaine jelly was instilled into the urethra. A 16 FR coud foley cath was replaced into the bladder no complications were noted Urine return was noted <89ml and urine was yellow in color. The balloon was filled with 74ml of sterile water. A night bag was attached for drainage and secured to his right leg with a leg strap.  Patient was given 3 additional night bags and 2 additional leg straps and patient was given instruction on how to change from one bag to another. Patient was given proper instruction on catheter care.    Performed by: Debroah Loop, PA-C   Follow up: Return in about 4 weeks (around 08/19/2019) for Catheter exchange.

## 2019-07-26 ENCOUNTER — Ambulatory Visit: Payer: Medicare HMO | Admitting: Physician Assistant

## 2019-08-22 ENCOUNTER — Ambulatory Visit: Payer: Medicare HMO | Admitting: Physician Assistant

## 2019-08-24 ENCOUNTER — Other Ambulatory Visit: Payer: Self-pay

## 2019-08-24 ENCOUNTER — Ambulatory Visit: Payer: Medicare HMO | Admitting: Physician Assistant

## 2019-08-24 DIAGNOSIS — R339 Retention of urine, unspecified: Secondary | ICD-10-CM

## 2019-08-24 NOTE — Progress Notes (Signed)
Cath Change/ Replacement  Patient is present today for a catheter change due to urinary retention.  67ml of water was removed from the balloon, a 16FR coud foley cath was removed without difficulty.  Patient was cleaned and prepped in a sterile fashion with betadine and 2% lidocaine jelly was instilled into the urethra. A 16 FR coud foley cath was replaced into the bladder no complications were noted Urine return was noted 22ml and urine was yellow in color. The balloon was filled with 77ml of sterile water. A night bag was attached for drainage and secured to his right thigh with a leg strap.  2 additional night bags and 2 additional leg straps were also given to the patient and patient was given instruction on how to change from one bag to another. Patient was given proper instruction on catheter care.    Performed by: Debroah Loop, PA-C   Additional notes: Patient's home health services have expired, we will plan for him to return for monthly catheter exchanges in clinic moving forward.  Patient's wife reports she is no longer doing vinegar instillations into his bladder due to patient discomfort.  I explained that these are important to reduce encrustation, which is why we started home health services in the first place.  Counseled her to resume these at least once weekly.  She expressed understanding.  Follow up: Return in about 4 weeks (around 09/21/2019) for Catheter exchange.

## 2019-09-12 ENCOUNTER — Encounter: Payer: Self-pay | Admitting: Emergency Medicine

## 2019-09-12 ENCOUNTER — Other Ambulatory Visit: Payer: Self-pay

## 2019-09-12 DIAGNOSIS — Z79899 Other long term (current) drug therapy: Secondary | ICD-10-CM | POA: Diagnosis not present

## 2019-09-12 DIAGNOSIS — T83091A Other mechanical complication of indwelling urethral catheter, initial encounter: Secondary | ICD-10-CM | POA: Diagnosis not present

## 2019-09-12 DIAGNOSIS — Z87891 Personal history of nicotine dependence: Secondary | ICD-10-CM | POA: Diagnosis not present

## 2019-09-12 DIAGNOSIS — N390 Urinary tract infection, site not specified: Secondary | ICD-10-CM | POA: Insufficient documentation

## 2019-09-12 DIAGNOSIS — R339 Retention of urine, unspecified: Secondary | ICD-10-CM | POA: Diagnosis present

## 2019-09-12 DIAGNOSIS — I129 Hypertensive chronic kidney disease with stage 1 through stage 4 chronic kidney disease, or unspecified chronic kidney disease: Secondary | ICD-10-CM | POA: Insufficient documentation

## 2019-09-12 DIAGNOSIS — N183 Chronic kidney disease, stage 3 unspecified: Secondary | ICD-10-CM | POA: Diagnosis not present

## 2019-09-12 DIAGNOSIS — B9689 Other specified bacterial agents as the cause of diseases classified elsewhere: Secondary | ICD-10-CM | POA: Diagnosis not present

## 2019-09-12 LAB — URINALYSIS, COMPLETE (UACMP) WITH MICROSCOPIC
Bilirubin Urine: NEGATIVE
Glucose, UA: NEGATIVE mg/dL
Hgb urine dipstick: NEGATIVE
Ketones, ur: NEGATIVE mg/dL
Nitrite: NEGATIVE
Protein, ur: 100 mg/dL — AB
Specific Gravity, Urine: 1.015 (ref 1.005–1.030)
Squamous Epithelial / HPF: NONE SEEN (ref 0–5)
WBC, UA: >50 WBC/hpf — ABNORMAL HIGH (ref 0–5)
pH: 5 (ref 5.0–8.0)

## 2019-09-12 NOTE — ED Triage Notes (Addendum)
Pt presents to ED with indwelling urinary catheter and c/o painful penis. Pt states catheter does not seem to be draining as much today. Small amount of yellow urine present in bag. Denies abd pain.

## 2019-09-13 ENCOUNTER — Emergency Department
Admission: EM | Admit: 2019-09-13 | Discharge: 2019-09-13 | Disposition: A | Discharge status: Home / Self Care | Payer: Medicare HMO | Attending: Emergency Medicine | Admitting: Emergency Medicine

## 2019-09-13 DIAGNOSIS — T839XXA Unspecified complication of genitourinary prosthetic device, implant and graft, initial encounter: Secondary | ICD-10-CM

## 2019-09-13 LAB — CBC WITH DIFFERENTIAL/PLATELET
Abs Immature Granulocytes: 0.02 10*3/uL (ref 0.00–0.07)
Basophils Absolute: 0 10*3/uL (ref 0.0–0.1)
Basophils Relative: 0 %
Eosinophils Absolute: 0.2 10*3/uL (ref 0.0–0.5)
Eosinophils Relative: 5 %
HCT: 37.6 % — ABNORMAL LOW (ref 39.0–52.0)
Hemoglobin: 12.2 g/dL — ABNORMAL LOW (ref 13.0–17.0)
Immature Granulocytes: 0 %
Lymphocytes Relative: 18 %
Lymphs Abs: 0.9 10*3/uL (ref 0.7–4.0)
MCH: 26.3 pg (ref 26.0–34.0)
MCHC: 32.4 g/dL (ref 30.0–36.0)
MCV: 81.2 fL (ref 80.0–100.0)
Monocytes Absolute: 0.5 10*3/uL (ref 0.1–1.0)
Monocytes Relative: 11 %
Neutro Abs: 3.3 10*3/uL (ref 1.7–7.7)
Neutrophils Relative %: 66 %
Platelets: 159 10*3/uL (ref 150–400)
RBC: 4.63 MIL/uL (ref 4.22–5.81)
RDW: 14.4 % (ref 11.5–15.5)
WBC: 4.9 10*3/uL (ref 4.0–10.5)
nRBC: 0 % (ref 0.0–0.2)

## 2019-09-13 LAB — BASIC METABOLIC PANEL
Anion gap: 9 (ref 5–15)
BUN: 30 mg/dL — ABNORMAL HIGH (ref 8–23)
CO2: 23 mmol/L (ref 22–32)
Calcium: 10.4 mg/dL — ABNORMAL HIGH (ref 8.9–10.3)
Chloride: 108 mmol/L (ref 98–111)
Creatinine, Ser: 2.46 mg/dL — ABNORMAL HIGH (ref 0.61–1.24)
GFR calc Af Amer: 29 mL/min — ABNORMAL LOW (ref >60)
GFR calc non Af Amer: 25 mL/min — ABNORMAL LOW (ref >60)
Glucose, Bld: 105 mg/dL — ABNORMAL HIGH (ref 70–99)
Potassium: 3.8 mmol/L (ref 3.5–5.1)
Sodium: 140 mmol/L (ref 135–145)

## 2019-09-13 MED ORDER — CEPHALEXIN 500 MG PO CAPS: 500.0000 mg | ORAL_CAPSULE | Freq: Two times a day (BID) | ORAL | 0 refills | Status: AC

## 2019-09-13 NOTE — ED Provider Notes (Signed)
[facility] Emergency Department Provider Note ____________________________________________   First MD Initiated Contact with Patient 09/13/19 [phone]     (approximate)  I have reviewed the triage vital signs and the nursing notes.   HISTORY  Chief Complaint Urinary Retention    HPI Troy Berry. is a 74 y.o. male with PMH as noted below including chronic urinary retention with indwelling Foley catheter who presents with decreased drainage from the Foley today, associated with suprapubic and penile pain and cloudy urine.  The patient thinks he may have had a fever.  He denies any vomiting or diarrhea, shortness of breath, chest pain or headache.  Past Medical History:  Diagnosis Date  . Anemia   . CKD (chronic kidney disease), stage III   . Gout   . Hypertension   . Urinary retention    in-dwelling Foley since Nov 2019, sees Dr. Hollice Espy    Patient Active Problem List   Diagnosis Date Noted  . Acute lower UTI 04/29/2018  . AKI (acute kidney injury) ([place])   . Other hydronephrosis   . Urinary retention   . ARF (acute renal failure) ([place]) 07/29/2017  . Anemia 03/09/2014  . Chronic kidney disease 03/09/2014  . Essential hypertension 03/09/2014    History reviewed. No pertinent surgical history.  Prior to Admission medications   Medication Sig Start Date End Date Taking? Authorizing Provider  acetaminophen (TYLENOL) 325 MG tablet Take 2 tablets (650 mg total) by mouth every 6 (six) hours as needed for mild pain (or Fever >/= 101). 04/30/18   Vaughan Basta, MD  allopurinol (ZYLOPRIM) 300 MG tablet Take 300 mg by mouth daily. 12/08/17   [provider]  amLODipine (NORVASC) 10 MG tablet Take 10 mg by mouth daily.    [provider]  amoxicillin-clavulanate (AUGMENTIN) 500-125 MG tablet Take 1 tablet by mouth 2 (two) times daily. 06/29/19   [provider]  aspirin EC 81 MG tablet Take 81 mg by mouth daily.     [provider]  carvedilol (COREG) 25 MG tablet Take 25 mg by mouth 2 (two) times daily with a meal.  05/11/18   [provider]  cephALEXin (KEFLEX) 500 MG capsule Take 1 capsule (500 mg total) by mouth 2 (two) times daily for 7 days. 09/13/19 09/20/19  Arta Silence, MD  cholecalciferol (VITAMIN D3) 25 MCG (1000 UT) tablet Take 1,000 Units by mouth daily.    [provider]  CRANBERRY-CALCIUM PO Take 1 capsule by mouth daily.    [provider]  gluconic acid-citric acid (RENACIDIN) irrigation Irrigate with 30 mLs as directed 2 (two) times a week. 04/14/19   Vaillancourt, Aldona Bar, PA-C  lovastatin (MEVACOR) 40 MG tablet Take 1 tablet by mouth every evening. 07/16/17   [provider]  polyethylene glycol (MIRALAX) 17 g packet Take 17 g by mouth daily. 08/30/18   Lavonia Drafts, MD    Allergies Ace inhibitors  Family History  Problem Relation Age of Onset  . Cancer Father     Social History Social History   Tobacco Use  . Smoking status: Former Smoker    Types: Cigarettes    Quit date: 04/07/1998    Years since quitting: 21.4  . Smokeless tobacco: Never Used  Substance Use Topics  . Alcohol use: Yes  . Drug use: Never    Review of Systems  Constitutional: Positive for subjective fever. Eyes: No redness. ENT: No sore throat. Cardiovascular: Denies chest pain. Respiratory: Denies shortness  of breath. Gastrointestinal: No vomiting. Genitourinary: Positive for urinary discomfort. Musculoskeletal: Negative for back pain. Skin: Negative for rash. Neurological: Negative for headache.   ____________________________________________   PHYSICAL EXAM:  VITAL SIGNS: ED Triage Vitals  Enc Vitals Group     BP 09/12/19 2127 (!) 147/66     Pulse Rate 09/12/19 2127 63     Resp 09/12/19 2127 18     Temp 09/12/19 2127 99.7 F (37.6 C)     Temp Source 09/12/19 2127 Oral     SpO2 09/12/19 2127 100 %     Weight 09/12/19 2128 150 lb (68  kg)     Height 09/12/19 2128 5\' 8"  (1.727 m)     Head Circumference --      Peak Flow --      Pain Score 09/12/19 2127 8     Pain Loc --      Pain Edu? --      Excl. in [place]? --     Constitutional: Alert and oriented. Well appearing and in no acute distress. Eyes: Conjunctivae are normal.  Head: Atraumatic. Nose: No congestion/rhinnorhea. Mouth/Throat: Mucous membranes are moist.   Neck: Normal range of motion.  Cardiovascular: Normal rate, regular rhythm.  Good peripheral circulation. Respiratory: Normal respiratory effort.  No retractions.  Gastrointestinal: Soft with mild suprapubic tenderness.  No distention.  Genitourinary: Normal external genitalia.   Musculoskeletal: Extremities warm and well perfused.  Neurologic:  Normal speech and language. No gross focal neurologic deficits are appreciated.  Skin:  Skin is warm and dry. No rash noted. Psychiatric: Mood and affect are normal. Speech and behavior are normal.  ____________________________________________   LABS (all labs ordered are listed, but only abnormal results are displayed)  Labs Reviewed  URINALYSIS, COMPLETE (UACMP) WITH MICROSCOPIC - Abnormal; Notable for the following components:      Result Value   Color, Urine YELLOW (*)    APPearance CLOUDY (*)    Protein, ur 100 (*)    Leukocytes,Ua LARGE (*)    WBC, UA >50 (*)    Bacteria, UA MANY (*)    All other components within normal limits  BASIC METABOLIC PANEL - Abnormal; Notable for the following components:   Glucose, Bld 105 (*)    BUN 30 (*)    Creatinine, Ser 2.46 (*)    Calcium 10.4 (*)    GFR calc non Af Amer 25 (*)    GFR calc Af Amer 29 (*)    All other components within normal limits  CBC WITH DIFFERENTIAL/PLATELET - Abnormal; Notable for the following components:   Hemoglobin 12.2 (*)    HCT 37.6 (*)    All other components within normal limits    ____________________________________________  EKG   ____________________________________________  RADIOLOGY    ____________________________________________   PROCEDURES  Procedure(s) performed: No  Procedures  Critical Care performed: No ____________________________________________   INITIAL IMPRESSION / ASSESSMENT AND PLAN / ED COURSE  Pertinent labs & imaging results that were available during my care of the patient were reviewed by me and considered in my medical decision making (see chart for details).  74 year old male with history of urinary retention and chronic indwelling Foley catheter presents with cloudy urine, decreased urine in the bag, and penile and suprapubic pain today.  I reviewed the past medical records in [place].  The patient was most recently seen in the ED in March with a similar presentation and was diagnosed with a UTI.  On exam he is overall relatively well-appearing.  His vital signs are normal except for hypertension as well as a borderline elevated temperature.  The abdomen is soft with mild suprapubic discomfort.  Foley catheter is in place with slightly cloudy urine output.  Overall the patient does not appear to be in acute urinary retention.  Presentation is most consistent with a UTI.  Urinalysis shows significant WBCs and bacteria, although this could represent colonization.  Given the patient's elevated temperature we will obtain basic labs.  We will change out the Foley.  I plan to discharge with empiric antibiotics.  ----------------------------------------- 3:54 AM on 09/13/2019 -----------------------------------------  Lab work-up is unremarkable.  The patient is feeling better after the Foley was changed.  He is stable for discharge at this time.  I will prescribe Keflex for likely recurrent UTI.  Return precautions given, and the patient and family member expressed  understanding.  ____________________________________________   FINAL CLINICAL IMPRESSION(S) / ED DIAGNOSES  Final diagnoses:  Problem with Foley catheter, initial encounter ([place])      NEW MEDICATIONS STARTED DURING THIS VISIT:  New Prescriptions   CEPHALEXIN (KEFLEX) 500 MG CAPSULE    Take 1 capsule (500 mg total) by mouth 2 (two) times daily for 7 days.     Note:  This document was prepared using Dragon voice recognition software and may include unintentional dictation errors.   Arta Silence, MD 09/13/19 [phone]

## 2019-09-13 NOTE — Discharge Instructions (Signed)
Take the antibiotic as prescribed and finish the full 7-day course.  Follow-up with Dr. Erlene Quan as scheduled.  Return to the ER for new, worsening, or persistent severe cloudy urine, clogs in the Foley, inability to pass urine, abdominal pain, fever chills, or any other new or worsening symptoms that concern you.

## 2019-09-18 ENCOUNTER — Emergency Department
Admission: EM | Admit: 2019-09-18 | Discharge: 2019-09-18 | Disposition: A | Discharge status: Home / Self Care | Payer: Medicare HMO | Attending: Emergency Medicine | Admitting: Emergency Medicine

## 2019-09-18 ENCOUNTER — Encounter: Payer: Self-pay | Admitting: Emergency Medicine

## 2019-09-18 ENCOUNTER — Other Ambulatory Visit: Payer: Self-pay

## 2019-09-18 DIAGNOSIS — Z79899 Other long term (current) drug therapy: Secondary | ICD-10-CM | POA: Insufficient documentation

## 2019-09-18 DIAGNOSIS — Z87891 Personal history of nicotine dependence: Secondary | ICD-10-CM | POA: Insufficient documentation

## 2019-09-18 DIAGNOSIS — T839XXA Unspecified complication of genitourinary prosthetic device, implant and graft, initial encounter: Secondary | ICD-10-CM | POA: Insufficient documentation

## 2019-09-18 DIAGNOSIS — Z466 Encounter for fitting and adjustment of urinary device: Secondary | ICD-10-CM | POA: Diagnosis present

## 2019-09-18 DIAGNOSIS — N183 Chronic kidney disease, stage 3 unspecified: Secondary | ICD-10-CM | POA: Diagnosis not present

## 2019-09-18 DIAGNOSIS — Y732 Prosthetic and other implants, materials and accessory gastroenterology and urology devices associated with adverse incidents: Secondary | ICD-10-CM | POA: Diagnosis not present

## 2019-09-18 DIAGNOSIS — Z7982 Long term (current) use of aspirin: Secondary | ICD-10-CM | POA: Insufficient documentation

## 2019-09-18 DIAGNOSIS — I129 Hypertensive chronic kidney disease with stage 1 through stage 4 chronic kidney disease, or unspecified chronic kidney disease: Secondary | ICD-10-CM | POA: Insufficient documentation

## 2019-09-18 NOTE — ED Notes (Signed)
Bladder scan reads 53mL

## 2019-09-18 NOTE — ED Notes (Addendum)
Pt presents to the ED. Pt wanted to be seen after the wife replaced the urinary drainage bag. Per pt the wife did not put the bag on right. Pt denies pain and does not have any signs of dysuria. Urine noted in the bag. On assessment, pt is not urinating around the cath and brief is dry at this time. No signs of infection. Reassured pt that the bag was connected to the drainage bag correctly. Pt is A&Ox4 and NAD.

## 2019-09-18 NOTE — ED Provider Notes (Signed)
[facility] Emergency Department Provider Note   ____________________________________________   First MD Initiated Contact with Patient 09/18/19 1047     (approximate)  I have reviewed the triage vital signs and the nursing notes.   HISTORY  Chief Complaint Urinary Retention    HPI Troy Berry. is a 74 y.o. male with past medical history of urinary retention status post chronic Foley, hypertension, CKD, and gout presents to the ED for Foley catheter problem.  Patient states that the Foley catheter bag was replaced earlier this morning and did not seem to be draining appropriately.  Patient thought maybe his wife did not put the bag on right.  Since then, urine has seemed to be draining appropriately.  He is currently on Keflex for UTI and denies any recent fevers, abdominal pain, nausea, or vomiting.  He has not had any leakage of urine from around the catheter.        Past Medical History:  Diagnosis Date  . Anemia   . CKD (chronic kidney disease), stage III   . Gout   . Hypertension   . Urinary retention    in-dwelling Foley since Nov 2019, sees Dr. Hollice Espy    Patient Active Problem List   Diagnosis Date Noted  . Acute lower UTI 04/29/2018  . AKI (acute kidney injury) ([place])   . Other hydronephrosis   . Urinary retention   . ARF (acute renal failure) ([place]) 07/29/2017  . Anemia 03/09/2014  . Chronic kidney disease 03/09/2014  . Essential hypertension 03/09/2014    History reviewed. No pertinent surgical history.  Prior to Admission medications   Medication Sig Start Date End Date Taking? Authorizing Provider  acetaminophen (TYLENOL) 325 MG tablet Take 2 tablets (650 mg total) by mouth every 6 (six) hours as needed for mild pain (or Fever >/= 101). 04/30/18   Vaughan Basta, MD  allopurinol (ZYLOPRIM) 300 MG tablet Take 300 mg by mouth daily. 12/08/17   [provider]  amLODipine (NORVASC) 10 MG tablet Take 10  mg by mouth daily.    [provider]  amoxicillin-clavulanate (AUGMENTIN) 500-125 MG tablet Take 1 tablet by mouth 2 (two) times daily. 06/29/19   [provider]  aspirin EC 81 MG tablet Take 81 mg by mouth daily.    [provider]  carvedilol (COREG) 25 MG tablet Take 25 mg by mouth 2 (two) times daily with a meal.  05/11/18   [provider]  cephALEXin (KEFLEX) 500 MG capsule Take 1 capsule (500 mg total) by mouth 2 (two) times daily for 7 days. 09/13/19 09/20/19  Arta Silence, MD  cholecalciferol (VITAMIN D3) 25 MCG (1000 UT) tablet Take 1,000 Units by mouth daily.    [provider]  CRANBERRY-CALCIUM PO Take 1 capsule by mouth daily.    [provider]  gluconic acid-citric acid (RENACIDIN) irrigation Irrigate with 30 mLs as directed 2 (two) times a week. 04/14/19   Vaillancourt, Aldona Bar, PA-C  lovastatin (MEVACOR) 40 MG tablet Take 1 tablet by mouth every evening. 07/16/17   [provider]  polyethylene glycol (MIRALAX) 17 g packet Take 17 g by mouth daily. 08/30/18   Lavonia Drafts, MD    Allergies Ace inhibitors  Family History  Problem Relation Age of Onset  . Cancer Father     Social History Social History   Tobacco Use  . Smoking status: Former Smoker    Types: Cigarettes    Quit date: 04/07/1998  Years since quitting: 21.4  . Smokeless tobacco: Never Used  Vaping Use  . Vaping Use: Never used  Substance Use Topics  . Alcohol use: Yes  . Drug use: Never    Review of Systems  Constitutional: No fever/chills Eyes: No visual changes. ENT: No sore throat. Cardiovascular: Denies chest pain. Respiratory: Denies shortness of breath. Gastrointestinal: No abdominal pain.  No nausea, no vomiting.  No diarrhea.  No constipation. Genitourinary: Negative for dysuria.  Positive for Foley catheter problem. Musculoskeletal: Negative for back pain. Skin: Negative for rash. Neurological: Negative for headaches,  focal weakness or numbness.  ____________________________________________   PHYSICAL EXAM:  VITAL SIGNS: ED Triage Vitals  Enc Vitals Group     BP 09/18/19 1037 (!) 162/70     Pulse Rate 09/18/19 1037 (!) 55     Resp 09/18/19 1037 18     Temp 09/18/19 1037 98.3 F (36.8 C)     Temp src --      SpO2 09/18/19 1037 100 %     Weight 09/18/19 1038 149 lb 14.6 oz (68 kg)     Height 09/18/19 1038 5\' 11"  (1.803 m)     Head Circumference --      Peak Flow --      Pain Score 09/18/19 1039 0     Pain Loc --      Pain Edu? --      Excl. in [place]? --     Constitutional: Alert and oriented. Eyes: Conjunctivae are normal. Head: Atraumatic. Nose: No congestion/rhinnorhea. Mouth/Throat: Mucous membranes are moist. Neck: Normal ROM Cardiovascular: Normal rate, regular rhythm. Grossly normal heart sounds. Respiratory: Normal respiratory effort.  No retractions. Lungs CTAB. Gastrointestinal: Soft and nontender. No distention. Genitourinary: Foley catheter in place with no bleeding or drainage from around catheter.  Clear yellow urine noted in bag. Musculoskeletal: No lower extremity tenderness nor edema. Neurologic:  Normal speech and language. No gross focal neurologic deficits are appreciated. Skin:  Skin is warm, dry and intact. No rash noted. Psychiatric: Mood and affect are normal. Speech and behavior are normal.  ____________________________________________   LABS (all labs ordered are listed, but only abnormal results are displayed)  Labs Reviewed - No data to display   PROCEDURES  Procedure(s) performed (including Critical Care):  Procedures   ____________________________________________   INITIAL IMPRESSION / ASSESSMENT AND PLAN / ED COURSE       74 year old male with history of urinary retention status post chronic Foley, hypertension, CKD, and gout presents to the ED with concern for malfunctioning Foley catheter.  At this time, his catheter seems to be functioning  appropriately with clear yellow urine noted in collecting bag.  No evidence of urinary retention his he has no abdominal tenderness and bladder scan shows 67 mL of urine.  Do not suspect worsening UTI as patient is asymptomatic at this time.  He is appropriate for discharge home and was advised to follow-up with urology, otherwise return to the ED for new or worsening symptoms.  Patient and wife agree with plan.      ____________________________________________   FINAL CLINICAL IMPRESSION(S) / ED DIAGNOSES  Final diagnoses:  Problem with Foley catheter, initial encounter [facility])     ED Discharge Orders    None       Note:  This document was prepared using Dragon voice recognition software and may include unintentional dictation errors.   Blake Divine, MD 09/18/19 1153

## 2019-09-18 NOTE — ED Triage Notes (Signed)
Pt to triage, wife states she changed foley bag today and that pt states it is not draining like normal.  Noted yellow urine in bag at this time.

## 2019-09-20 ENCOUNTER — Ambulatory Visit: Payer: Medicare HMO | Admitting: Physician Assistant

## 2019-10-13 ENCOUNTER — Other Ambulatory Visit: Payer: Self-pay

## 2019-10-13 ENCOUNTER — Encounter: Payer: Self-pay | Admitting: Physician Assistant

## 2019-10-13 ENCOUNTER — Ambulatory Visit: Payer: Medicare HMO | Admitting: Physician Assistant

## 2019-10-13 VITALS — BP 161/83 | HR 64 | Ht 71.0 in | Wt 147.0 lb

## 2019-10-13 DIAGNOSIS — K4090 Unilateral inguinal hernia, without obstruction or gangrene, not specified as recurrent: Secondary | ICD-10-CM | POA: Diagnosis not present

## 2019-10-13 DIAGNOSIS — R339 Retention of urine, unspecified: Secondary | ICD-10-CM | POA: Diagnosis not present

## 2019-10-13 NOTE — Progress Notes (Signed)
Cath Change/ Replacement  Patient is present today for a catheter change due to urinary retention.  79ml of water was removed from the balloon, a 14FR foley cath was removed without difficulty.  Patient was cleaned and prepped in a sterile fashion with betadine and 2% lidocaine jelly was instilled into the urethra. A 16 FR coude foley cath was replaced into the bladder no complications were noted Urine return was noted 32ml and urine was clear in color. The balloon was filled with 82ml of sterile water. A night bag was attached for drainage and attached to his right leg with a leg strap.  An additional night bag was also given to the patient and patient was given instruction on how to change from one bag to another. Patient was given proper instruction on catheter care.    Performed by: Debroah Loop, PA-C

## 2019-10-13 NOTE — Progress Notes (Signed)
10/13/2019 3:31 PM   [place]. Apr 17, 1945 [id number]  CC: Chief Complaint  Patient presents with  . Urinary Retention    HPI: Troy Berry. is a 74 y.o. male with a history of urinary retention managed by indwelling Foley catheter and catheter encrustation requiring Renacidin instillations who presents today as a same-day add-on for evaluation of lower abdominal pain with a possibly malfunctioning versus encrusted Foley catheter.  He is accompanied today by his wife, who contributes to HPI.  Today, patient reports a 1 day history of lower abdominal pain.  He says the pain is intermittent in nature.  He is unsure if he has had any urinary leakage around his catheter.  Notably, he was seen in the ED on 09/13/2019 and treated with Keflex for possible catheter related UTI.  No urine culture performed.  Renacidin installations previously performed by home health, however patient's insurance coverage for this service was exhausted.  He is not using Renacidin regularly on his own at home.  As result, it appears that he is encrusting his catheters approximately 3 weeks after placement.  PMH: Past Medical History:  Diagnosis Date  . Anemia   . CKD (chronic kidney disease), stage III   . Gout   . Hypertension   . Urinary retention    in-dwelling Foley since Nov 2019, sees Dr. Hollice Espy    Surgical History: No past surgical history on file.  Home Medications:  Allergies as of 10/13/2019      Reactions   Ace Inhibitors    Other reaction(s): Other (See Comments) lip swelling      Medication List       Accurate as of October 13, 2019  3:31 PM. If you have any questions, ask your nurse or doctor.        acetaminophen 325 MG tablet Commonly known as: TYLENOL Take 2 tablets (650 mg total) by mouth every 6 (six) hours as needed for mild pain (or Fever >/= 101).   allopurinol 300 MG tablet Commonly known as: ZYLOPRIM Take 300 mg by mouth daily.   amLODipine 10  MG tablet Commonly known as: NORVASC Take 10 mg by mouth daily.   amoxicillin-clavulanate 500-125 MG tablet Commonly known as: AUGMENTIN Take 1 tablet by mouth 2 (two) times daily.   aspirin EC 81 MG tablet Take 81 mg by mouth daily.   carvedilol 25 MG tablet Commonly known as: COREG Take 25 mg by mouth 2 (two) times daily with a meal.   cholecalciferol 25 MCG (1000 UNIT) tablet Commonly known as: VITAMIN D3 Take 1,000 Units by mouth daily.   CRANBERRY-CALCIUM PO Take 1 capsule by mouth daily.   gluconic acid-citric acid irrigation Irrigate with 30 mLs as directed 2 (two) times a week.   lovastatin 40 MG tablet Commonly known as: MEVACOR Take 1 tablet by mouth every evening.   polyethylene glycol 17 g packet Commonly known as: MiraLax Take 17 g by mouth daily.       Allergies:  Allergies  Allergen Reactions  . Ace Inhibitors     Other reaction(s): Other (See Comments) lip swelling    Family History: Family History  Problem Relation Age of Onset  . Cancer Father     Social History:   reports that he quit smoking about 21 years ago. His smoking use included cigarettes. He has never used smokeless tobacco. He reports current alcohol use. He reports that he does not use drugs.  Physical Exam: BP Marland Kitchen)  161/83   Pulse 64   Ht 5\' 11"  (1.803 m)   Wt 147 lb (66.7 kg)   BMI 20.50 kg/m   Constitutional:  Alert and oriented, no acute distress, nontoxic appearing HEENT: [place], AT Cardiovascular: No clubbing, cyanosis, or edema Respiratory: Normal respiratory effort, no increased work of breathing GI: Right inguinal hernia noted to be enlarged, firm, and tender to the touch.  I was able to reduce the hernia in clinic with significant difficulty. GU: Foley catheter in place draining clear, yellow urine Skin: No rashes, bruises or suspicious lesions Neurologic: Grossly intact, no focal deficits, moving all 4 extremities Psychiatric: Normal mood and affect  Assessment  & Plan:   1. Urinary retention Foley catheter draining well today, however his last catheter exchange was 1 month ago.  Proceeded with catheter exchange in clinic today, see separate procedure note for details.    With his history of catheter encrustation and noncompliance with Renacidin, will plan to continue with Foley cath exchanges every 3 weeks in clinic in an attempt to avoid unnecessary ED visits.  Patient and wife are in agreement with this plan.  2. Right inguinal hernia I had great difficulty in reducing the hernia in clinic today, but ultimately it was reducible.  Hernia was tender to palpation and patient stated this tenderness was the pain that he had been experiencing all day.  He reported resolution of pain with hernia reduction.  Will defer urine testing with patient's report of pain resolution with hernia reduction.  I counseled the patient and his wife that irreducible hernias arr medical emergencies and that if this hernia becomes irreducible, they should seek care in the emergency room immediately.  Return in about 3 weeks (around 11/03/2019) for Catheter exchange.  Debroah Loop, PA-C  [facility] [address], [place], [place] [address] [phone]

## 2019-10-21 ENCOUNTER — Telehealth: Payer: Self-pay

## 2019-10-21 NOTE — Telephone Encounter (Signed)
FYI - Need to reschedule patients 10/24/19 catheter exchange appt. Patient had catheter changed on 7/8, 7/19 would be too soon. Patient relies on daughter for transportation to appts, informed patients wife to have patients daughter call to reschedule for another day that works with her schedule.

## 2019-10-24 ENCOUNTER — Ambulatory Visit: Payer: Medicare HMO | Admitting: Physician Assistant

## 2019-10-25 ENCOUNTER — Ambulatory Visit: Payer: Medicare HMO | Admitting: Physician Assistant

## 2019-11-03 ENCOUNTER — Other Ambulatory Visit: Payer: Self-pay

## 2019-11-03 ENCOUNTER — Ambulatory Visit (INDEPENDENT_AMBULATORY_CARE_PROVIDER_SITE_OTHER): Payer: Medicare HMO | Admitting: Physician Assistant

## 2019-11-03 ENCOUNTER — Encounter: Payer: Self-pay | Admitting: Physician Assistant

## 2019-11-03 VITALS — BP 152/72 | HR 58 | Ht 71.0 in | Wt 147.0 lb

## 2019-11-03 DIAGNOSIS — R339 Retention of urine, unspecified: Secondary | ICD-10-CM

## 2019-11-03 NOTE — Progress Notes (Signed)
Cath Change/ Replacement  Patient is present today for a catheter change due to urinary retention.  34ml of water was removed from the balloon, a 16FR coude foley cath was removed without difficulty.  Patient was cleaned and prepped in a sterile fashion with betadine and 2% lidocaine jelly was instilled into the urethra. A 16 FR coude foley cath was replaced into the bladder no complications were noted Urine return was noted 42ml and urine was yellow in color. The balloon was filled with 26ml of sterile water. A night bag was attached for drainage and catheter was secured to his right thigh with a leg strap.  He declined additional drainage bags.   Performed by: Edwin Dada, CMA and Debroah Loop, PA-C   Follow up: Return in about 3 weeks (around 11/24/2019) for Catheter exchange.

## 2019-11-04 ENCOUNTER — Ambulatory Visit: Payer: Medicare HMO | Admitting: Physician Assistant

## 2019-11-14 ENCOUNTER — Ambulatory Visit: Payer: Medicare HMO | Admitting: Physician Assistant

## 2019-11-24 ENCOUNTER — Encounter: Payer: Self-pay | Admitting: Physician Assistant

## 2019-11-24 ENCOUNTER — Ambulatory Visit: Payer: Medicare HMO | Admitting: Physician Assistant

## 2019-11-24 ENCOUNTER — Other Ambulatory Visit: Payer: Self-pay

## 2019-11-24 VITALS — BP 157/74

## 2019-11-24 DIAGNOSIS — R339 Retention of urine, unspecified: Secondary | ICD-10-CM

## 2019-11-24 NOTE — Patient Instructions (Signed)
Indwelling Urinary Catheter Care, Adult An indwelling urinary catheter is a thin tube that is put into your bladder. The tube helps to drain pee (urine) out of your body. The tube goes in through your urethra. Your urethra is where pee comes out of your body. Your pee will come out through the catheter, then it will go into a bag (drainage bag). Take good care of your catheter so it will work well. How to wear your catheter and bag Supplies needed  Sticky tape (adhesive tape) or a leg strap.  Alcohol wipe or soap and water (if you use tape).  A clean towel (if you use tape).  Large overnight bag.  Smaller bag (leg bag). Wearing your catheter Attach your catheter to your leg with tape or a leg strap.  Make sure the catheter is not pulled tight.  If a leg strap gets wet, take it off and put on a dry strap.  If you use tape to hold the bag on your leg: 1. Use an alcohol wipe or soap and water to wash your skin where the tape made it sticky before. 2. Use a clean towel to pat-dry that skin. 3. Use new tape to make the bag stay on your leg. Wearing your bags You should have been given a large overnight bag.  You may wear the overnight bag in the day or night.  Always have the overnight bag lower than your bladder.  Do not let the bag touch the floor.  Before you go to sleep, put a clean plastic bag in a wastebasket. Then hang the overnight bag inside the wastebasket. You should also have a smaller leg bag that fits under your clothes.  Always wear the leg bag below your knee.  Do not wear your leg bag at night. How to care for your skin and catheter Supplies needed  A clean washcloth.  Water and mild soap.  A clean towel. Caring for your skin and catheter      Clean the skin around your catheter every day: 1. Wash your hands with soap and water. 2. Wet a clean washcloth in warm water and mild soap. 3. Clean the skin around your urethra.  If you are  male:  Gently spread the folds of skin around your vagina (labia).  With the washcloth in your other hand, wipe the inner side of your labia on each side. Wipe from front to back.  If you are male:  Pull back any skin that covers the end of your penis (foreskin).  With the washcloth in your other hand, wipe your penis in small circles. Start wiping at the tip of your penis, then move away from the catheter.  Move the foreskin back in place, if needed. 4. With your free hand, hold the catheter close to where it goes into your body.  Keep holding the catheter during cleaning so it does not get pulled out. 5. With the washcloth in your other hand, clean the catheter.  Only wipe downward on the catheter.  Do not wipe upward toward your body. Doing this may push germs into your urethra and cause infection. 6. Use a clean towel to pat-dry the catheter and the skin around it. Make sure to wipe off all soap. 7. Wash your hands with soap and water.  Shower every day. Do not take baths.  Do not use cream, ointment, or lotion on the area where the catheter goes into your body, unless your doctor tells you   to.  Do not use powders, sprays, or lotions on your genital area.  Check your skin around the catheter every day for signs of infection. Check for: ? Redness, swelling, or pain. ? Fluid or blood. ? Warmth. ? Pus or a bad smell. How to empty the bag Supplies needed  Rubbing alcohol.  Gauze pad or cotton ball.  Tape or a leg strap. Emptying the bag Pour the pee out of your bag when it is ?- full, or at least 2-3 times a day. Do this for your overnight bag and your leg bag. 1. Wash your hands with soap and water. 2. Separate (detach) the bag from your leg. 3. Hold the bag over the toilet or a clean pail. Keep the bag lower than your hips and bladder. This is so the pee (urine) does not go back into the tube. 4. Open the pour spout. It is at the bottom of the bag. 5. Empty the  pee into the toilet or pail. Do not let the pour spout touch any surface. 6. Put rubbing alcohol on a gauze pad or cotton ball. 7. Use the gauze pad or cotton ball to clean the pour spout. 8. Close the pour spout. 9. Attach the bag to your leg with tape or a leg strap. 10. Wash your hands with soap and water. Follow instructions for cleaning the drainage bag:  From the product maker.  As told by your doctor. How to change the bag Supplies needed  Alcohol wipes.  A clean bag.  Tape or a leg strap. Changing the bag Replace your bag when it starts to leak, smell bad, or look dirty. 1. Wash your hands with soap and water. 2. Separate the dirty bag from your leg. 3. Pinch the catheter with your fingers so that pee does not spill out. 4. Separate the catheter tube from the bag tube where these tubes connect (at the connection valve). Do not let the tubes touch any surface. 5. Clean the end of the catheter tube with an alcohol wipe. Use a different alcohol wipe to clean the end of the bag tube. 6. Connect the catheter tube to the tube of the clean bag. 7. Attach the clean bag to your leg with tape or a leg strap. Do not make the bag tight on your leg. 8. Wash your hands with soap and water. General rules   Never pull on your catheter. Never try to take it out. Doing that can hurt you.  Always wash your hands before and after you touch your catheter or bag. Use a mild, fragrance-free soap. If you do not have soap and water, use hand sanitizer.  Always make sure there are no twists or bends (kinks) in the catheter tube.  Always make sure there are no leaks in the catheter or bag.  Drink enough fluid to keep your pee pale yellow.  Do not take baths, swim, or use a hot tub.  If you are male, wipe from front to back after you poop (have a bowel movement). Contact a doctor if:  Your pee is cloudy.  Your pee smells worse than usual.  Your catheter gets clogged.  Your catheter  leaks.  Your bladder feels full. Get help right away if:  You have redness, swelling, or pain where the catheter goes into your body.  You have fluid, blood, pus, or a bad smell coming from the area where the catheter goes into your body.  Your skin feels warm where   the catheter goes into your body.  You have a fever.  You have pain in your: ? Belly (abdomen). ? Legs. ? Lower back. ? Bladder.  You see blood in the catheter.  Your pee is pink or red.  You feel sick to your stomach (nauseous).  You throw up (vomit).  You have chills.  Your pee is not draining into the bag.  Your catheter gets pulled out. Summary  An indwelling urinary catheter is a thin tube that is placed into the bladder to help drain pee (urine) out of the body.  The catheter is placed into the part of the body that drains pee from the bladder (urethra).  Taking good care of your catheter will keep it working properly and help prevent problems.  Always wash your hands before and after touching your catheter or bag.  Never pull on your catheter or try to take it out. This information is not intended to replace advice given to you by your health care provider. Make sure you discuss any questions you have with your health care provider. Document Revised: 07/16/2018 Document Reviewed: 11/07/2016 Elsevier Patient Education  2020 [employer]  

## 2019-11-24 NOTE — Progress Notes (Signed)
Cath Change/ Replacement  Patient is present today for a catheter change due to urinary retention.  59ml of water was removed from the balloon, a 16FR coude foley cath was removed without difficulty.  Patient was cleaned and prepped in a sterile fashion with betadine and 2% lidocaine jelly was instilled into the urethra. A 16 FR coude foley cath was replaced into the bladder no complications were noted Urine return was noted 75ml and urine was yellow in color. The balloon was filled with 47ml of sterile water. A night bag was attached for drainage.  Patient was given proper instruction on catheter care.    Performed by: Debroah Loop, PA-C   Follow up: Return in about 3 weeks (around 12/15/2019) for Catheter exchange.

## 2019-12-16 ENCOUNTER — Ambulatory Visit: Payer: Medicare HMO | Admitting: Physician Assistant

## 2019-12-16 ENCOUNTER — Other Ambulatory Visit: Payer: Self-pay

## 2019-12-16 ENCOUNTER — Encounter: Payer: Self-pay | Admitting: Physician Assistant

## 2019-12-16 VITALS — BP 161/78 | HR 60 | Ht 71.0 in | Wt 147.0 lb

## 2019-12-16 DIAGNOSIS — R339 Retention of urine, unspecified: Secondary | ICD-10-CM

## 2019-12-16 NOTE — Progress Notes (Signed)
Cath Change/ Replacement  Patient is present today for a catheter change due to urinary retention.  76ml of water was removed from the balloon, a 16FR coude foley cath was removed without difficulty.  Patient was cleaned and prepped in a sterile fashion with betadine and 2% lidocaine jelly was instilled into the urethra. A 16 FR coude foley cath was replaced into the bladder no complications were noted Urine return was noted 37ml and urine was clear in color. The balloon was filled with 60ml of sterile water. A night bag was attached for drainage.  Patient declined additional supplies.    Performed by: Debroah Loop, PA-C   Follow up: Return in about 3 weeks (around 01/06/2020) for Catheter exchange.

## 2020-01-06 ENCOUNTER — Encounter: Payer: Self-pay | Admitting: Physician Assistant

## 2020-01-06 ENCOUNTER — Ambulatory Visit (INDEPENDENT_AMBULATORY_CARE_PROVIDER_SITE_OTHER): Payer: Medicare HMO | Admitting: Physician Assistant

## 2020-01-06 ENCOUNTER — Other Ambulatory Visit: Payer: Self-pay

## 2020-01-06 VITALS — BP 130/64

## 2020-01-06 DIAGNOSIS — R339 Retention of urine, unspecified: Secondary | ICD-10-CM

## 2020-01-06 NOTE — Progress Notes (Signed)
Cath Change/ Replacement  Patient is present today for a catheter change due to urinary retention.  42ml of water was removed from the balloon, a 16FR coude foley cath was removed without difficulty.  Patient was cleaned and prepped in a sterile fashion with betadine and 2% lidocaine jelly was instilled into the urethra. A 16 FR coude foley cath was replaced into the bladder no complications were noted Urine return was noted <45ml and urine was yellow in color. The balloon was filled with 20ml of sterile water. A night bag was attached for drainage.     Performed by: Debroah Loop, PA-C and Gaspar Cola, CMA  Follow up: Return in about 3 weeks (around 01/27/2020) for Catheter exchange.

## 2020-01-27 ENCOUNTER — Ambulatory Visit: Payer: Medicare HMO | Admitting: Physician Assistant

## 2020-01-27 ENCOUNTER — Other Ambulatory Visit: Payer: Self-pay

## 2020-01-27 DIAGNOSIS — R339 Retention of urine, unspecified: Secondary | ICD-10-CM | POA: Diagnosis not present

## 2020-01-27 NOTE — Progress Notes (Signed)
Cath Change/ Replacement  Patient is present today for a catheter change due to urinary retention.  66ml of water was removed from the balloon, a 16FR coude foley cath was removed without difficulty.  Patient was cleaned and prepped in a sterile fashion with betadine and 2% lidocaine jelly was instilled into the urethra. A 16 FR coude silicone foley cath was replaced into the bladder no complications were noted Urine return was noted 230ml and urine was yellow in color. The balloon was filled with 50ml of sterile water. A night bag was attached for drainage.    Performed by: Debroah Loop, PA-C   Additional notes: Placed a silicone Foley today in an attempt to decrease encrustation and possibly reduce frequency of scheduled changes. New Foley drained approximately 268mL with placement today consistent with partial occlusion of the old catheter.  Follow up: Return in about 3 weeks (around 02/17/2020) for Catheter exchange.

## 2020-02-17 ENCOUNTER — Other Ambulatory Visit: Payer: Self-pay

## 2020-02-17 ENCOUNTER — Ambulatory Visit: Payer: Medicare HMO | Admitting: Physician Assistant

## 2020-02-17 VITALS — BP 154/79 | HR 80

## 2020-02-17 DIAGNOSIS — R339 Retention of urine, unspecified: Secondary | ICD-10-CM

## 2020-02-17 LAB — BLADDER SCAN AMB NON-IMAGING: Scan Result: 20

## 2020-02-17 NOTE — Progress Notes (Signed)
Cath Change/ Replacement  Patient is present today for a catheter change due to urinary retention.  63ml of water was removed from the balloon, a 83GP coude silicone foley cath was removed without difficulty.  Patient was cleaned and prepped in a sterile fashion with betadine and 2% lidocaine jelly was instilled into the urethra. A 16 FR coude silicone foley cath was replaced into the bladder no complications were noted Urine return was noted 66ml and urine was yellow in color. The balloon was filled with 22ml of sterile water. A night bag was attached for drainage.      Performed by: Debroah Loop, PA-C   Additional notes: Bladder scan with 81mL, consistent with well-draining silicone catheter at 3 weeks. Will continue with 49IY coude silicone catheters; patient prefers to continue with exchanges every 3 weeks. Will consider increasing frequency of exchanges to every 4 weeks in the future.   Follow up: Return in about 4 weeks (around 03/16/2020) for Catheter exchange.

## 2020-03-07 ENCOUNTER — Other Ambulatory Visit: Payer: Self-pay

## 2020-03-07 ENCOUNTER — Ambulatory Visit: Payer: Medicare HMO | Admitting: Physician Assistant

## 2020-03-07 ENCOUNTER — Encounter: Payer: Self-pay | Admitting: Physician Assistant

## 2020-03-07 VITALS — BP 153/68 | HR 59 | Ht 71.0 in

## 2020-03-07 DIAGNOSIS — R339 Retention of urine, unspecified: Secondary | ICD-10-CM

## 2020-03-07 NOTE — Progress Notes (Signed)
Cath Change/ Replacement  Patient is present today for a catheter change due to urinary retention.  24ml of water was removed from the balloon, a 15VV coude silicone foley cath was removed without difficulty.  Patient was cleaned and prepped in a sterile fashion with betadine and 2% lidocaine jelly was instilled into the urethra. A 16 FR coude foley cath was replaced into the bladder no complications were noted Urine return was noted 53ml and urine was yellow in color. The balloon was filled with 68ml of sterile water. A night bag was attached for drainage.    Performed by: Debroah Loop, PA-C   Additional notes: Patient reports night bag has continued to detach from silicone catheter and has been quite bothersome. Will switch back to latex catheters at this point. 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 I explained that this will not resolve on its own, but it may worsen. Offered them SPT placement as an alternative; they wish to defer this at this time. Will continue to discuss SPT placement if erosion worsens.  Follow up: Return in about 3 weeks (around 03/28/2020) for Catheter exchange.

## 2020-03-09 ENCOUNTER — Ambulatory Visit: Payer: Medicare HMO | Admitting: Physician Assistant

## 2020-03-16 ENCOUNTER — Ambulatory Visit: Payer: Medicare HMO | Admitting: Physician Assistant

## 2020-03-27 ENCOUNTER — Ambulatory Visit: Payer: Medicare HMO | Admitting: Physician Assistant

## 2020-03-27 ENCOUNTER — Other Ambulatory Visit: Payer: Self-pay

## 2020-03-27 VITALS — BP 159/72 | HR 61

## 2020-03-27 DIAGNOSIS — R339 Retention of urine, unspecified: Secondary | ICD-10-CM | POA: Diagnosis not present

## 2020-03-27 NOTE — Progress Notes (Signed)
Cath Change/ Replacement  Patient is present today for a catheter change due to urinary retention.  31ml of water was removed from the balloon, a 16FR coude foley cath was removed without difficulty.  Patient was cleaned and prepped in a sterile fashion with betadine and 2% lidocaine jelly was instilled into the urethra. A 16 FR coud foley cath was replaced into the bladder no complications were noted Urine return was noted 48ml and urine was yellow in color. The balloon was filled with 55ml of sterile water. A night bag was attached for drainage.     Performed by: Debroah Loop, PA-C   Follow up: Return in about 3 weeks (around 04/17/2020) for Catheter exchange.

## 2020-04-13 ENCOUNTER — Ambulatory Visit: Payer: Medicare HMO | Admitting: Physician Assistant

## 2020-04-17 ENCOUNTER — Ambulatory Visit: Payer: Medicare HMO | Admitting: Physician Assistant

## 2020-04-18 ENCOUNTER — Other Ambulatory Visit: Payer: Self-pay

## 2020-04-18 ENCOUNTER — Ambulatory Visit (INDEPENDENT_AMBULATORY_CARE_PROVIDER_SITE_OTHER): Payer: Medicare HMO | Admitting: Physician Assistant

## 2020-04-18 DIAGNOSIS — R339 Retention of urine, unspecified: Secondary | ICD-10-CM

## 2020-04-18 NOTE — Progress Notes (Signed)
Cath Change/ Replacement  Patient is present today for a catheter change due to urinary retention.  54ml of water was removed from the balloon, a 16FR coude foley cath was removed without difficulty.  Patient was cleaned and prepped in a sterile fashion with betadine. A 16 FR coude foley cath was replaced into the bladder no complications were noted Urine return was noted 74ml and urine was pink in color. The balloon was filled with 65ml of sterile water. A night bag was attached for drainage.  Patient tolerated well.    Performed by: Debroah Loop, PA-C   Follow up: Return in about 3 weeks (around 05/09/2020) for Catheter exchange.

## 2020-05-10 ENCOUNTER — Other Ambulatory Visit: Payer: Self-pay

## 2020-05-10 ENCOUNTER — Ambulatory Visit (INDEPENDENT_AMBULATORY_CARE_PROVIDER_SITE_OTHER): Payer: Medicare HMO | Admitting: Physician Assistant

## 2020-05-10 VITALS — BP 156/73 | HR 65

## 2020-05-10 DIAGNOSIS — R339 Retention of urine, unspecified: Secondary | ICD-10-CM

## 2020-05-10 NOTE — Patient Instructions (Addendum)
Your catheter keeps getting clogged with crystals from your urine--that is what is causing your lower belly pain and leakage of urine around your catheter. You need to start putting vinegar in your bladder again to keep this from happening. The instructions on how to do this are below.  If you cannot start putting vinegar in your bladder again, we should try to get you set up with home health nursing again so that they can either put in the vinegar or the prescription medicine Renacidin.   When you talk to the lady from [place] today, please tell her that you need help at home with your bathing and daily care, as well as to help you manage your catheter to keep your infection risk low due to urinary encrustation. If the lady from [place] help you with this, you need to go back to [facility] and ask them to help you get home health care.  If you need any paperwork from me about this, please call our office and I will give this to you.  Vinegar Bladder Irrigation Protocol  Please start irrigating your bladder with a vinegar solution as described below to reduce urinary encrustation.  Most grocery stores carry white vinegar as a 5% solution. To make an appropriate bladder irrigation solution, you will need to dilute this at a ratio of roughly 20:1.  To achieve this, see the chart below to determine what amount of 5% white vinegar solution you should mix with your normal bladder irrigation (homemade saline or sterile saline from the pharmacy).  If you are starting with a saline volume of... ...232mL, then add 2.5 tsp (12.45mL) of white vinegar .Marland KitchenMarland Kitchen554mL, then add 5 tsp (63mL) of white vinegar .Marland Kitchen.1072mL, then add 10 tsp (68mL) of white vinegar ...1 gallon, then add about 6 oz of white vinegar  Instill 66mL of the vinegar solution through your catheter into the bladder three times daily. Leave the solution in the bladder for 10 minutes each time, then drain. Discontinue irrigation if it causes  pain or discomfort. You may store any leftover solution in the refrigerator for up to 1 week.

## 2020-05-10 NOTE — Progress Notes (Signed)
Cath Change/ Replacement  Patient is present today for a catheter change due to urinary retention.  73ml of water was removed from the balloon, a 16FR coude foley cath was removed without difficulty.  Patient was cleaned and prepped in a sterile fashion with betadine. A 16 FR coude foley cath was replaced into the bladder no complications were noted Urine return was noted 279ml and urine was yellow in color. The balloon was filled with 20ml of sterile water. A night bag was attached for drainage.     Performed by: Debroah Loop, PA-C   Additional notes: Catheter tip visibly encrusted today. Patient previously unable to manage silicone catheters and has urethral erosion, so I am unwilling to upsize him to increase lumen diameter. I had a lengthy conversation with the patient and his wife today that they need to resume vinegar vs Renacidin bladder instillations that wife discontinued for unknown reasons. Patient has poor health literacy with low functional status, and I am concerned this will not be done. I have strongly encouraged the patient and his wife to reach out to their insurance and PCP to discuss home health care for assistance with ADLs and catheter maintenance.  Follow up: Return in about 3 weeks (around 05/31/2020) for Catheter exchange.

## 2020-05-11 ENCOUNTER — Ambulatory Visit: Payer: Self-pay | Admitting: Physician Assistant

## 2020-05-29 ENCOUNTER — Other Ambulatory Visit: Payer: Self-pay

## 2020-05-29 ENCOUNTER — Ambulatory Visit: Payer: Medicare HMO | Admitting: Physician Assistant

## 2020-05-29 DIAGNOSIS — R338 Other retention of urine: Secondary | ICD-10-CM | POA: Diagnosis not present

## 2020-05-29 MED ORDER — CEFTRIAXONE SODIUM 1 G IJ SOLR
1.0000 g | Freq: Once | INTRAMUSCULAR | Status: AC
  Administered 2020-05-29: 1 g via INTRAMUSCULAR

## 2020-05-29 NOTE — Progress Notes (Signed)
Cath Change/ Replacement  Patient is present today for a catheter change due to urinary retention.  71ml of water was removed from the balloon, a 16FR coude foley cath was removed without difficulty.  Patient was cleaned and prepped in a sterile fashion with betadine. A 16 FR coude foley cath was replaced into the bladder no complications were noted Urine return was noted 51ml and urine was yellow in color. The balloon was filled with 17ml of sterile water. A night bag was attached for drainage.     Performed by: Debroah Loop, PA-C   Additional notes: Patient presented to clinic for urgent Foley catheter exchange 19 days after his last appointment due to catheter encrustation.  Wife reports attempting vinegar instillation, but states the catheter was already occluded and she was unable to get the solution into his catheter. I reiterated that vinegar instillations should be performed daily to prevent encrustation, not to treat it once it occurs.  I treated Mr. 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  Will continue catheter exchange every 3 weeks.  Patient and wife continue to struggle with poor functional status and health literacy. They have not contacted their PCP to pursue possible home health services, as Mrs. Rickett reports she is too overwhelmed with caring for Mr. Bednarczyk to call [facility] to make an appointment. Notably, Mr. Ryans' hygiene remains concerning today: there is dried stool visualized over his ventral penile shaft and there are worn holes in his shoes. I am increasingly concerned that the patient and his wife are not capable of caring for themselves adequately at home and am certain they would be helped by in-home assistance versus SNF. I contacted [facility] via telephone today on their behalf to request resources or an  evaluation and they are referring him to their internal case manager for further assistance.  Follow up: Return in about 3 weeks (around 06/19/2020) for Catheter exchange.

## 2020-05-29 NOTE — Progress Notes (Signed)
IM Injection  Patient is present today for an IM Injection  Drug: Rocephin Dose:1GM Location:Left upper outer buttocks Lot: [id number] Exp:10/2022 Patient tolerated well, no complications were noted  Preformed by: Fonnie Jarvis, CMA

## 2020-05-31 ENCOUNTER — Ambulatory Visit: Payer: Self-pay | Admitting: Physician Assistant

## 2020-06-01 ENCOUNTER — Ambulatory Visit: Payer: Self-pay | Admitting: Physician Assistant

## 2020-06-15 ENCOUNTER — Ambulatory Visit: Payer: Medicare HMO | Admitting: Physician Assistant

## 2020-06-15 ENCOUNTER — Other Ambulatory Visit: Payer: Self-pay

## 2020-06-15 DIAGNOSIS — R339 Retention of urine, unspecified: Secondary | ICD-10-CM | POA: Diagnosis not present

## 2020-06-15 NOTE — Progress Notes (Signed)
Cath Change/ Replacement  Patient is present today for a catheter change due to urinary retention.  69ml of water was removed from the balloon, a 16FR coude foley cath was removed without difficulty.  Patient was cleaned and prepped in a sterile fashion with betadine. A 16 FR coude foley cath was replaced into the bladder no complications were noted Urine return was not noted and patient was counseled to drink fluids and return to clinic if it had not begun to drain within 2 hours. The balloon was filled with 77ml of sterile water. A night bag was attached for drainage.    Performed by: Debroah Loop, PA-C   Follow up: Return in about 3 weeks (around 07/06/2020) for Catheter exchange.

## 2020-06-19 ENCOUNTER — Ambulatory Visit: Payer: Self-pay | Admitting: Physician Assistant

## 2020-07-06 ENCOUNTER — Other Ambulatory Visit: Payer: Self-pay

## 2020-07-06 ENCOUNTER — Ambulatory Visit: Payer: Medicare HMO | Admitting: Physician Assistant

## 2020-07-06 DIAGNOSIS — R339 Retention of urine, unspecified: Secondary | ICD-10-CM | POA: Diagnosis not present

## 2020-07-06 NOTE — Progress Notes (Signed)
Cath Change/ Replacement  Patient is present today for a catheter change due to urinary retention.  76ml of water was removed from the balloon, a 16FR coude foley cath was removed without difficulty.  Patient was cleaned and prepped in a sterile fashion with betadine. A 16 FR coude foley cath was replaced into the bladder no complications were noted Urine return was noted 33ml and urine was yellow in color. The balloon was filled with 22ml of sterile water. A night bag was attached for drainage.  Patient tolerated well.    Performed by: Debroah Loop, PA-C   Follow up: Return in about 3 weeks (around 07/27/2020) for Catheter exchange.

## 2020-07-26 ENCOUNTER — Other Ambulatory Visit: Payer: Self-pay

## 2020-07-26 ENCOUNTER — Ambulatory Visit: Payer: Medicare HMO | Admitting: Physician Assistant

## 2020-07-26 DIAGNOSIS — R339 Retention of urine, unspecified: Secondary | ICD-10-CM | POA: Diagnosis not present

## 2020-07-26 NOTE — Progress Notes (Signed)
Cath Change/ Replacement  Patient is present today for a catheter change due to urinary retention.  67ml of water was removed from the balloon, a 16FR coude foley cath was removed without difficulty.  Patient was cleaned and prepped in a sterile fashion with betadine. A 16 FR coude foley cath was replaced into the bladder no complications were noted Urine return was noted 100ml and urine was clear in color. The balloon was filled with 39ml of sterile water. A night bag was attached for drainage.      Performed by: Debroah Loop, PA-C   Follow up: Return in about 3 weeks (around 08/16/2020) for Catheter exchange.

## 2020-08-16 ENCOUNTER — Ambulatory Visit: Payer: Self-pay | Admitting: Physician Assistant

## 2020-08-17 ENCOUNTER — Other Ambulatory Visit: Payer: Self-pay

## 2020-08-17 ENCOUNTER — Ambulatory Visit: Payer: Medicare HMO | Admitting: Physician Assistant

## 2020-08-17 DIAGNOSIS — R339 Retention of urine, unspecified: Secondary | ICD-10-CM | POA: Diagnosis not present

## 2020-08-17 NOTE — Progress Notes (Signed)
Cath Change/ Replacement  Patient is present today for a catheter change due to urinary retention.  73ml of water was removed from the balloon, a 16FR coude foley cath was removed without difficulty.  Patient was cleaned and prepped in a sterile fashion with betadine and 2% lidocaine jelly was instilled into the urethra. A 16 FR coude foley cath was replaced into the bladder no complications were noted Urine return was noted 64ml and urine was clear in color. The balloon was filled with 82ml of sterile water. A night bag was attached for drainage.      Performed by: Debroah Loop, PA-C   Follow up: Return in about 3 weeks (around 09/07/2020) for Catheter exchange.

## 2020-09-07 ENCOUNTER — Ambulatory Visit: Payer: Medicare HMO | Admitting: Physician Assistant

## 2020-09-07 ENCOUNTER — Other Ambulatory Visit: Payer: Self-pay

## 2020-09-07 ENCOUNTER — Encounter: Payer: Self-pay | Admitting: Physician Assistant

## 2020-09-07 VITALS — BP 138/65 | HR 56

## 2020-09-07 DIAGNOSIS — R339 Retention of urine, unspecified: Secondary | ICD-10-CM

## 2020-09-07 NOTE — Progress Notes (Signed)
Cath Change/ Replacement  Patient is present today for a catheter change due to urinary retention.  40ml of water was removed from the balloon, a 16FR coude foley cath was removed without difficulty.  Patient was cleaned and prepped in a sterile fashion with betadine and 2% lidocaine jelly was instilled into the urethra. A 16 FR coude foley cath was replaced into the bladder no complications were noted Urine return was noted 99ml and urine was clear in color. The balloon was filled with 103ml of sterile water. A night bag was attached for drainage.  Patient tolerated well.    Performed by: Debroah Loop, PA-C   Additional notes: Patient, leg strap, and drainage bag all visibly soiled today, unclear if with stool, mud, etc. Patient also has large holes in his shoes. I have previously reached out to [facility] requesting social work involvement due to my concerns regarding poor self-care. I called them again today to reiterate my concerns. I was informed that home health was rejected by his insurance and though the patient would qualify for PACE services, Mrs. Cazier declined this citing out of pocket costs. I was also informed that Mrs. Myler stated she would not want Mr. Baggerly in the PACE program regardless of cost. I requested that [facility] follow up with their care manager with any other suggestions, given my ongoing concerns for poor self-care.  Follow up: Return in about 3 weeks (around 09/28/2020) for Catheter exchange.

## 2020-09-24 ENCOUNTER — Other Ambulatory Visit: Payer: Self-pay

## 2020-09-24 ENCOUNTER — Ambulatory Visit: Payer: Medicare HMO | Admitting: Physician Assistant

## 2020-09-24 VITALS — BP 161/67 | HR 59

## 2020-09-24 DIAGNOSIS — R339 Retention of urine, unspecified: Secondary | ICD-10-CM

## 2020-09-24 NOTE — Progress Notes (Signed)
Cath Change/ Replacement  Patient is present today for a catheter change due to urinary retention.  34ml of water was removed from the balloon, a 16FR coude foley cath was removed without difficulty.  Patient was cleaned and prepped in a sterile fashion with betadine and 2% lidocaine jelly was instilled into the urethra. A 16 FR coude foley cath was replaced into the bladder no complications were noted Urine return was noted 75ml and urine was clear in color. The balloon was filled with 54ml of sterile water. A night bag was attached for drainage.  Patient tolerated well.    Performed by: Debroah Loop, PA-C   Follow up: Return in about 3 weeks (around 10/15/2020) for Catheter exchange.

## 2020-09-28 ENCOUNTER — Ambulatory Visit: Payer: Self-pay | Admitting: Physician Assistant

## 2020-10-15 ENCOUNTER — Ambulatory Visit: Payer: Self-pay | Admitting: Physician Assistant

## 2020-10-19 ENCOUNTER — Ambulatory Visit: Payer: Medicare HMO | Admitting: Physician Assistant

## 2020-10-19 ENCOUNTER — Other Ambulatory Visit: Payer: Self-pay

## 2020-10-19 VITALS — BP 147/71 | HR 60

## 2020-10-19 DIAGNOSIS — R339 Retention of urine, unspecified: Secondary | ICD-10-CM

## 2020-10-19 NOTE — Progress Notes (Signed)
Cath Change/ Replacement  Patient is present today for a catheter change due to urinary retention.  51ml of water was removed from the balloon, a 16FR coude foley cath was removed without difficulty.  Patient was cleaned and prepped in a sterile fashion with betadine and 2% lidocaine jelly was instilled into the urethra. A 16 FR coude foley cath was replaced into the bladder no complications were noted Urine return was noted 82ml and urine was yellow in color. The balloon was filled with 20ml of sterile water. A night bag was attached for drainage.  Patient tolerated well.    Performed by: Debroah Loop, PA-C   Follow up: Return in about 3 weeks (around 11/09/2020) for Catheter exchange.

## 2020-11-09 ENCOUNTER — Other Ambulatory Visit: Payer: Self-pay

## 2020-11-09 ENCOUNTER — Ambulatory Visit: Payer: Medicare HMO | Admitting: Physician Assistant

## 2020-11-09 DIAGNOSIS — R339 Retention of urine, unspecified: Secondary | ICD-10-CM

## 2020-11-09 NOTE — Progress Notes (Signed)
Cath Change/ Replacement  Patient is present today for a catheter change due to urinary retention.  69ml of water was removed from the balloon, a 16FR coude foley cath was removed without difficulty.  Patient was cleaned and prepped in a sterile fashion with betadine and 2% lidocaine jelly was instilled into the urethra. A 16 FR coude foley cath was replaced into the bladder no complications were noted Urine return was noted 62ml and urine was yellow in color. The balloon was filled with 48ml of sterile water. A night bag was attached for drainage.  Patient tolerated well.    Performed by: Debroah Loop, PA-C   Follow up: Return in about 3 weeks (around 11/30/2020) for Catheter exchange.

## 2020-12-06 ENCOUNTER — Ambulatory Visit: Payer: Medicare HMO | Admitting: Physician Assistant

## 2020-12-06 ENCOUNTER — Other Ambulatory Visit: Payer: Self-pay

## 2020-12-06 DIAGNOSIS — R339 Retention of urine, unspecified: Secondary | ICD-10-CM | POA: Diagnosis not present

## 2020-12-06 NOTE — Progress Notes (Signed)
Cath Change/ Replacement  Patient is present today for a catheter change due to urinary retention.  5ml of water was removed from the balloon, a 16FR coude foley cath was removed without difficulty.  Patient was cleaned and prepped in a sterile fashion with betadine and 2% lidocaine jelly was instilled into the urethra. A 16 FR coude foley cath was replaced into the bladder no complications were noted Urine return was noted 54ml and urine was yellow in color. The balloon was filled with 41ml of sterile water. A night bag was attached for drainage.  Patient tolerated well.    Performed by: Debroah Loop, PA-C   Follow up: Return in about 3 weeks (around 12/27/2020) for Catheter exchange.

## 2020-12-25 ENCOUNTER — Ambulatory Visit: Payer: Medicare HMO | Admitting: Physician Assistant

## 2020-12-25 ENCOUNTER — Other Ambulatory Visit: Payer: Self-pay

## 2020-12-25 DIAGNOSIS — R339 Retention of urine, unspecified: Secondary | ICD-10-CM | POA: Diagnosis not present

## 2020-12-25 NOTE — Progress Notes (Signed)
Cath Change/ Replacement  Patient is present today for a catheter change due to urinary retention.  67ml of water was removed from the balloon, a 16FR coude foley cath was removed without difficulty.  Patient was cleaned and prepped in a sterile fashion with betadine and 2% lidocaine jelly was instilled into the urethra. A 16 FR coude foley cath was replaced into the bladder no complications were noted Urine return was noted 61ml and urine was yellow in color. The balloon was filled with 59ml of sterile water. A night bag was attached for drainage.  Patient tolerated well.    Performed by: Debroah Loop, PA-C   Follow up: Return in about 4 weeks (around 01/22/2021) for Catheter exchange.

## 2020-12-26 IMAGING — CT CT ABD-PELV W/O
2 of 4 series · 15 of 46 positions shown, 17 images · non-contrast
Comparison: CT 07/29/2017

CLINICAL DATA: Abdominal distension and lower abdominal pain

EXAM:
CT ABDOMEN AND PELVIS WITHOUT CONTRAST
TECHNIQUE: Multidetector CT imaging of the abdomen and pelvis was performed
following the standard protocol without IV contrast.

[Series 2: routine abd/pel wo · axial · 0.75mm/px · z∈[-925,-505]mm · 12 of 92 slices shown, 14 images]
[im 4/92  soft-tissue]
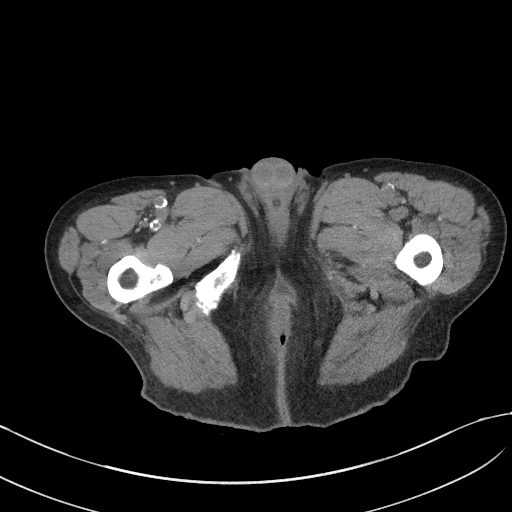
[im 4/92  bone]
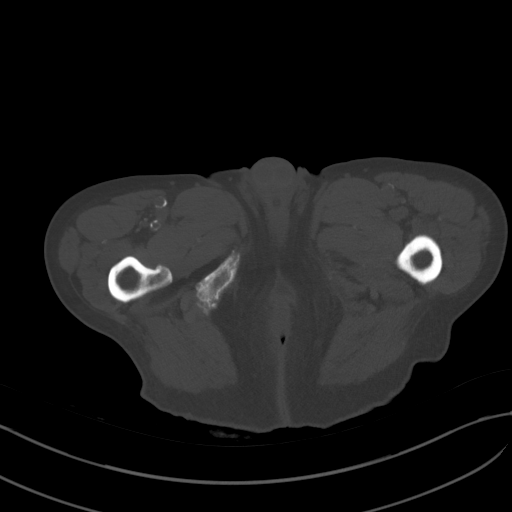
[im 12/92  soft-tissue]
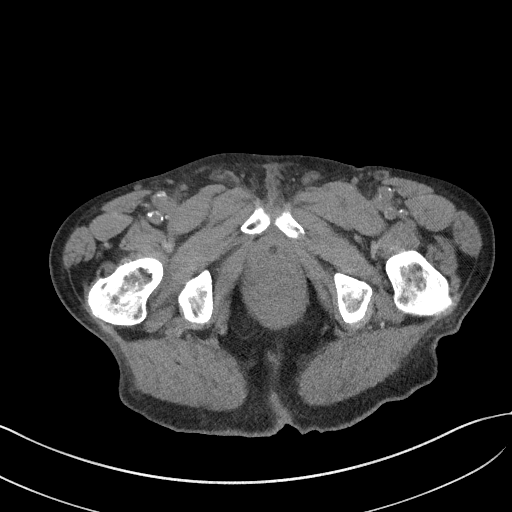
[im 19/92  soft-tissue]
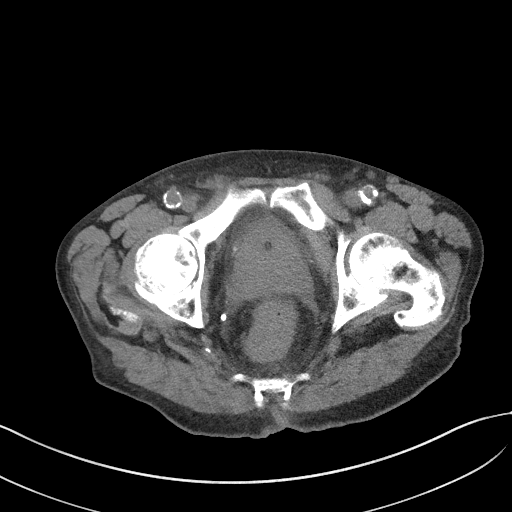
[im 27/92  soft-tissue]
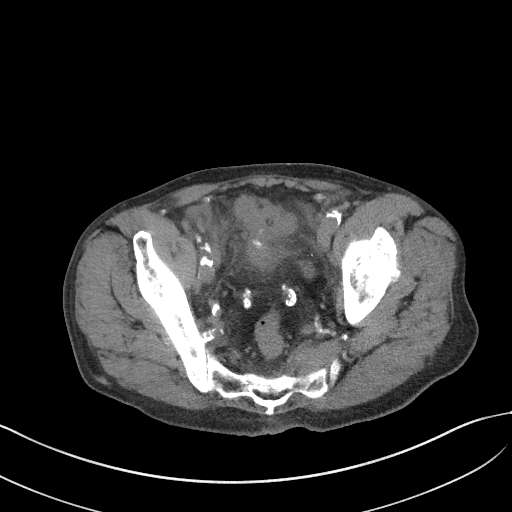
[im 35/92  soft-tissue]
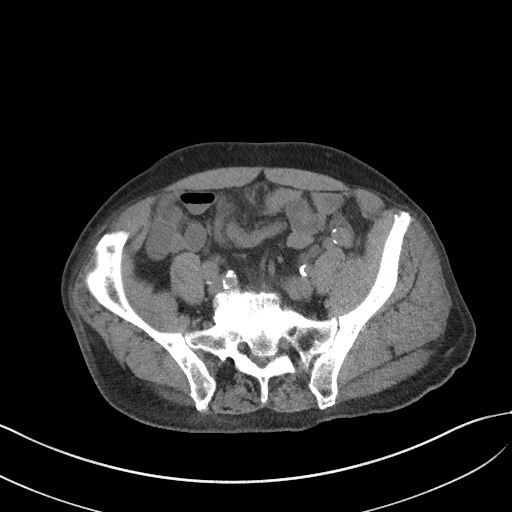
[im 42/92  soft-tissue]
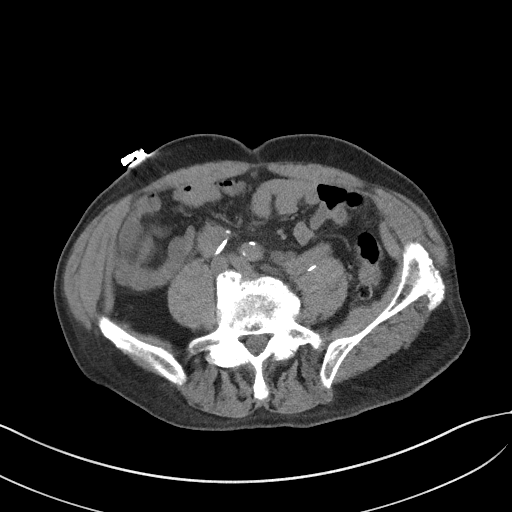
[im 50/92  soft-tissue]
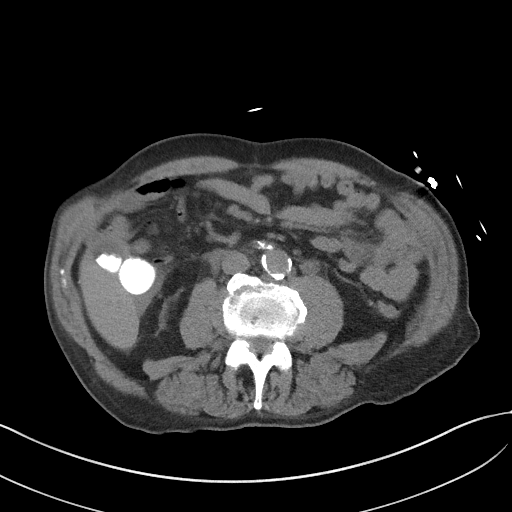
[im 57/92  soft-tissue]
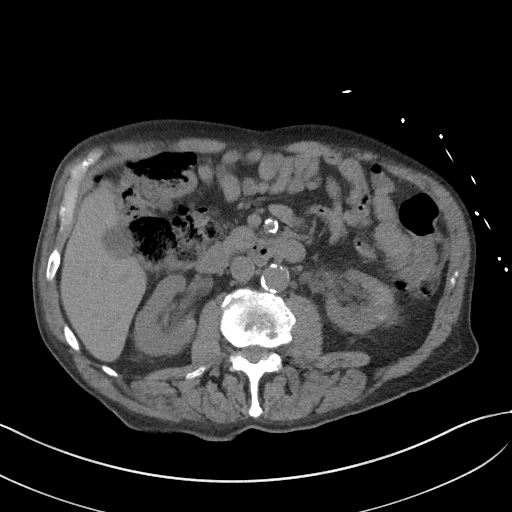
[im 65/92  soft-tissue]
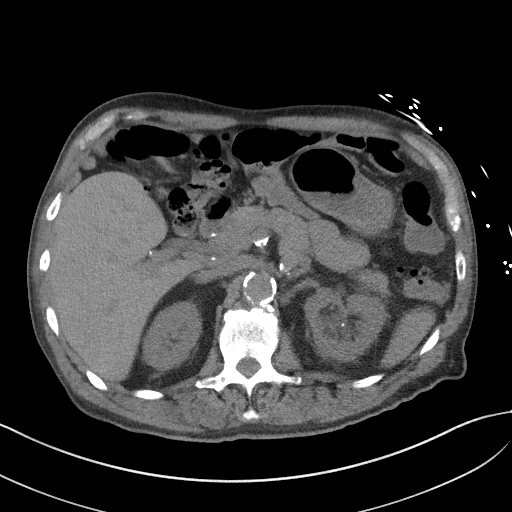
[im 65/92  bone]
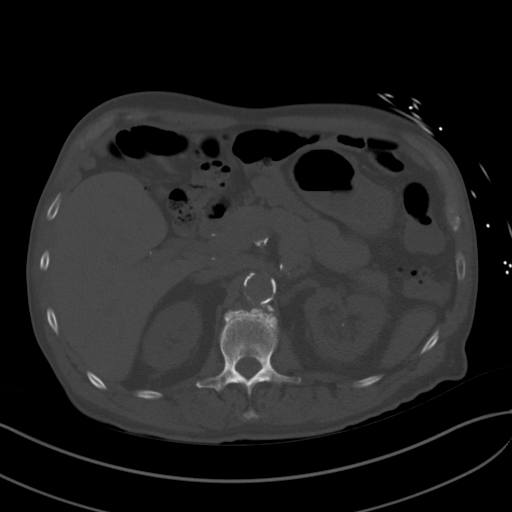
[im 73/92  soft-tissue]
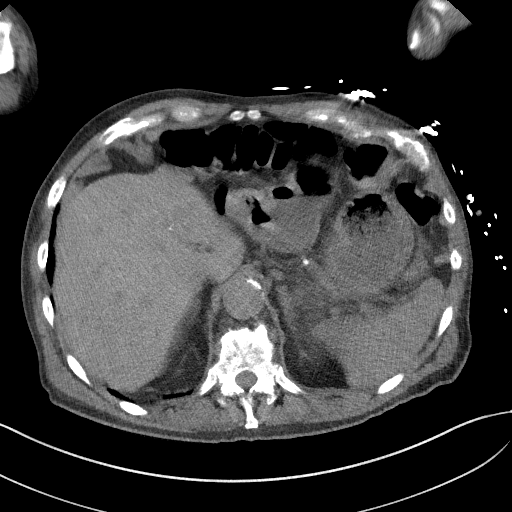
[im 80/92  soft-tissue]
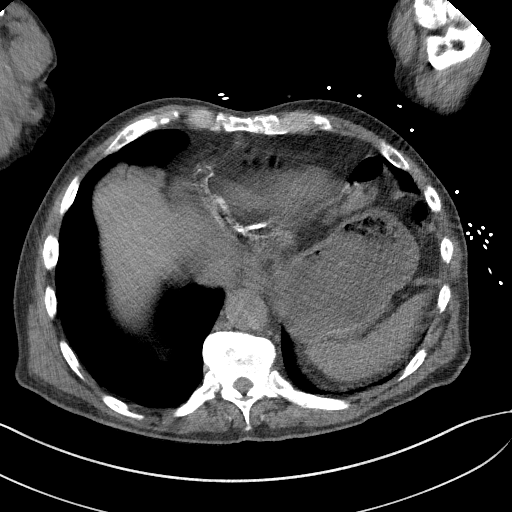
[im 88/92  soft-tissue]
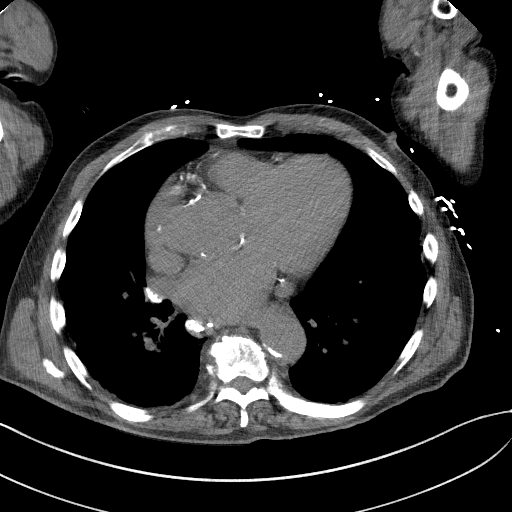

[Series 5: coronal st · coronal · 0.74mm/px · 3 of 88 slices shown]
[im 30/88  soft-tissue]
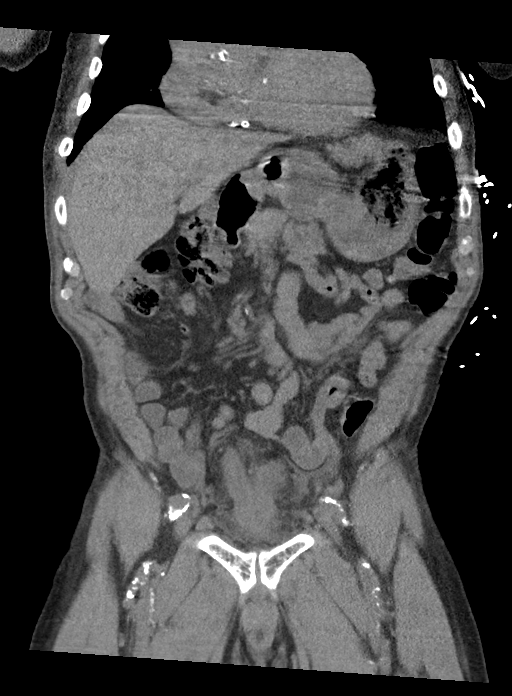
[im 39/88  soft-tissue]
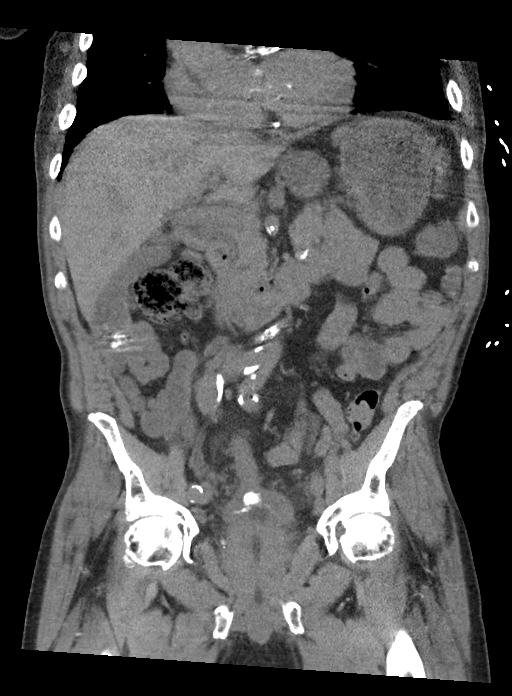
[im 49/88  soft-tissue]
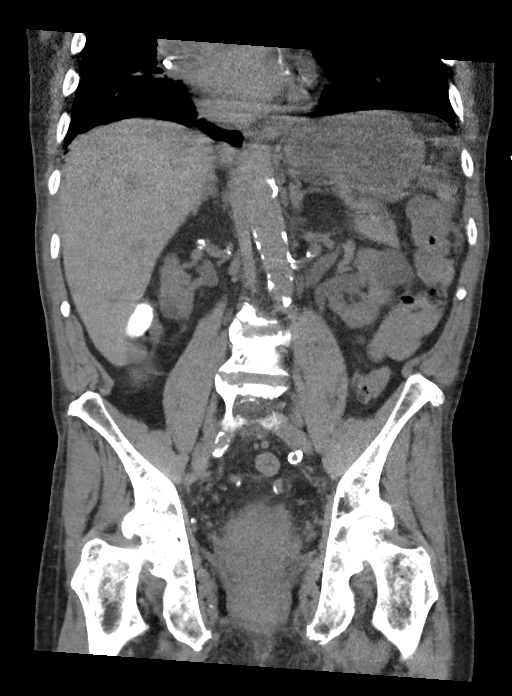

[15 of 46 positions shown; findings below may reference images not displayed]

FINDINGS: Lower chest: Lung bases demonstrate scattered lung nodules, many of
which are calcified. No acute consolidation or effusion. Coronary
vascular calcification. Cardiomegaly. Calcified hilar and
mediastinal lymph nodes.

Hepatobiliary: Numerous stones within the gallbladder. No focal
hepatic abnormality or biliary dilatation

Pancreas: Unremarkable. No pancreatic ductal dilatation or
surrounding inflammatory changes.

Spleen: Normal in size without focal abnormality.

Adrenals/Urinary Tract: Adrenal glands are normal. Probable cysts
within the left kidney. Hyperdense lesion mid to lower left kidney,
possible hemorrhagic or proteinaceous cyst. Renal vascular
calcification. Slight enlargement of the renal pelvises with
prominent ureters bilaterally. Foley catheter in the bladder.
Bladder appears thick walled and there is hazy edema within the
pelvis around the bladder. Increased density within the bladder
around the Foley catheter, may reflect stones within the bladder.

Stomach/Bowel: Stomach is within normal limits. Appendix appears
normal. No evidence of bowel wall thickening, distention, or
inflammatory changes.

Vascular/Lymphatic: Severe aortic atherosclerosis. No aneurysm. No
significantly enlarged lymph nodes.

Reproductive: Enlarged prostate gland

Other: Negative for free air or free fluid.

Musculoskeletal: Degenerative changes without acute or suspicious
abnormality.
IMPRESSION: 1. Prominent renal pelvises with dilated ureters bilaterally. Foley
catheter in the decompressed bladder. Bladder appears thick walled
and there is edema/soft tissue stranding in the pelvis, suggesting
possible cystitis. No ureteral stones. High density within the
decompressed urinary bladder around the Foley catheter balloon, may
reflect bladder stones.
2. Numerous gallstones
3. Cardiomegaly.  Evidence of prior granulomatous disease.
4. Enlarged prostate gland

## 2020-12-27 ENCOUNTER — Ambulatory Visit: Payer: Medicare HMO | Admitting: Physician Assistant

## 2021-01-15 ENCOUNTER — Ambulatory Visit (INDEPENDENT_AMBULATORY_CARE_PROVIDER_SITE_OTHER): Payer: Medicare HMO | Admitting: Physician Assistant

## 2021-01-15 ENCOUNTER — Other Ambulatory Visit: Payer: Self-pay

## 2021-01-15 DIAGNOSIS — R339 Retention of urine, unspecified: Secondary | ICD-10-CM | POA: Diagnosis not present

## 2021-01-15 NOTE — Progress Notes (Signed)
Cath Change/ Replacement  Patient is present today for a catheter change due to urinary retention.  31ml of water was removed from the balloon, a 16FR coude foley cath was removed without difficulty.  Patient was cleaned and prepped in a sterile fashion with betadine and 2% lidocaine jelly was instilled into the urethra. A 16 FR coude foley cath was replaced into the bladder no complications were noted Urine return was noted 86ml and urine was yellow in color. The balloon was filled with 37ml of sterile water. A night bag was attached for drainage.  Patient tolerated well.    Performed by: Debroah Loop, PA-C   Follow up: Return in about 3 weeks (around 02/05/2021) for Catheter exchange.

## 2021-02-05 ENCOUNTER — Ambulatory Visit: Payer: Medicare HMO | Admitting: Physician Assistant

## 2021-02-05 ENCOUNTER — Other Ambulatory Visit: Payer: Self-pay

## 2021-02-05 ENCOUNTER — Ambulatory Visit (INDEPENDENT_AMBULATORY_CARE_PROVIDER_SITE_OTHER): Payer: Medicare HMO | Admitting: Physician Assistant

## 2021-02-05 DIAGNOSIS — R339 Retention of urine, unspecified: Secondary | ICD-10-CM

## 2021-02-05 NOTE — Progress Notes (Signed)
Cath Change/ Replacement  Patient is present today for a catheter change due to urinary retention.  70ml of water was removed from the balloon, a 16FR coude foley cath was removed without difficulty.  Patient was cleaned and prepped in a sterile fashion with betadine and 2% lidocaine jelly was instilled into the urethra. A 16 FR coude foley cath was replaced into the bladder no complications were noted Urine return was noted 22ml and urine was yellow in color. The balloon was filled with 56ml of sterile water. A night bag was attached for drainage.  Patient tolerated well.    Performed by: Debroah Loop, PA-C   Follow up: Return in about 3 weeks (around 02/26/2021) for Catheter exchange.

## 2021-02-26 ENCOUNTER — Ambulatory Visit (INDEPENDENT_AMBULATORY_CARE_PROVIDER_SITE_OTHER): Payer: Medicare HMO | Admitting: Physician Assistant

## 2021-02-26 ENCOUNTER — Other Ambulatory Visit: Payer: Self-pay

## 2021-02-26 DIAGNOSIS — R339 Retention of urine, unspecified: Secondary | ICD-10-CM

## 2021-02-26 NOTE — Progress Notes (Signed)
Cath Change/ Replacement  Patient is present today for a catheter change due to urinary retention.  22ml of water was removed from the balloon, a 16FR coude foley cath was removed without difficulty.  Patient was cleaned and prepped in a sterile fashion with betadine and 2% lidocaine jelly was instilled into the urethra. A 16 FR coude foley cath was replaced into the bladder no complications were noted Urine return was noted 32ml and urine was yellow in color. The balloon was filled with 32ml of sterile water. A night bag was attached for drainage.  Patient tolerated well.    Performed by: Debroah Loop, PA-C   Follow up: Return in about 3 weeks (around 03/19/2021) for Catheter exchange.

## 2021-03-19 ENCOUNTER — Ambulatory Visit: Payer: Medicare HMO | Admitting: Physician Assistant

## 2021-03-22 ENCOUNTER — Ambulatory Visit (INDEPENDENT_AMBULATORY_CARE_PROVIDER_SITE_OTHER): Payer: Medicare HMO | Admitting: Physician Assistant

## 2021-03-22 ENCOUNTER — Other Ambulatory Visit: Payer: Self-pay

## 2021-03-22 DIAGNOSIS — Z466 Encounter for fitting and adjustment of urinary device: Secondary | ICD-10-CM

## 2021-03-22 NOTE — Progress Notes (Signed)
Cath Change/ Replacement  Patient is present today for a catheter change due to urinary retention.  52ml of water was removed from the balloon, a 16FR coude foley cath was removed without difficulty.  Patient was cleaned and prepped in a sterile fashion with betadine and 2% lidocaine jelly was instilled into the urethra. A 16 FR coude foley cath was replaced into the bladder no complications were noted Urine return was noted 118ml and urine was clear in color. The balloon was filled with 50ml of sterile water. A night bag was attached for drainage.  Patient tolerated well.    Performed by: Debroah Loop, PA-C   Follow up: Return in about 3 weeks (around 04/12/2021) for Catheter exchange.

## 2021-04-12 ENCOUNTER — Other Ambulatory Visit: Payer: Self-pay

## 2021-04-12 ENCOUNTER — Ambulatory Visit (INDEPENDENT_AMBULATORY_CARE_PROVIDER_SITE_OTHER): Payer: Medicare HMO

## 2021-04-12 DIAGNOSIS — R339 Retention of urine, unspecified: Secondary | ICD-10-CM

## 2021-04-12 DIAGNOSIS — Z466 Encounter for fitting and adjustment of urinary device: Secondary | ICD-10-CM | POA: Diagnosis not present

## 2021-04-12 NOTE — Progress Notes (Signed)
Cath Change/ Replacement  Patient is present today for a catheter change due to urinary retention. 15ml of water was removed from the balloon, a 16FR Coude foley cath was removed with out difficulty.  Patient was cleaned and prepped in a sterile fashion with betadine. A 16FR foley cath was replaced into the bladder no complications were noted. Urine return was noted 175ml and urine was pale yellow in color. The balloon was filled with 73ml of sterile water. A night bag was attached for drainage. Patient was given proper instruction on catheter care.    Performed by: Gordy Clement, CMA   Follow up: RTC in 3 week for cath exchange

## 2021-05-02 NOTE — Progress Notes (Signed)
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  I discussed this finding with his wife and the patient and suggested they consider suprapubic tube in the future.  They stated they were not interested in that at this time and would like to continue with indwelling Foley.  Patient is present today for a catheter change due to urinary retention.  9 ml of water was removed from the balloon, a 16 FR Coude foley cath was removed with out difficulty.  Patient was cleaned and prepped in a sterile fashion with betadine. A 16 Coude FR foley cath was replaced into the bladder no complications were noted Urine return was noted 20 ml and urine was pink, clear in color. The balloon was filled with 62ml of sterile water. A night bag was attached for drainage.  Patient was given proper instruction on catheter care.    Performed by: Zara Council, PA-C and Kerman Passey, CMA   Follow up: 3 weeks for Foley exchange.

## 2021-05-03 ENCOUNTER — Ambulatory Visit (INDEPENDENT_AMBULATORY_CARE_PROVIDER_SITE_OTHER): Payer: Medicare HMO | Admitting: Urology

## 2021-05-03 ENCOUNTER — Other Ambulatory Visit: Payer: Self-pay

## 2021-05-03 VITALS — BP 166/69 | HR 60

## 2021-05-03 DIAGNOSIS — R339 Retention of urine, unspecified: Secondary | ICD-10-CM | POA: Diagnosis not present

## 2021-05-03 DIAGNOSIS — Z466 Encounter for fitting and adjustment of urinary device: Secondary | ICD-10-CM

## 2021-05-16 NOTE — Progress Notes (Signed)
Cath Change/ Replacement  Patient is present today for a catheter change due to urinary retention.  56ml of water was removed from the balloon, a 16 FR foley cath was removed with out difficulty.  Patient was cleaned and prepped in a sterile fashion with betadine. A 16 FR foley cath was replaced into the bladder no complications were noted   Urine return was noted 30 ml and urine was yellow in color. The balloon was filled with 11ml of sterile water. A  bag was attached for drainage.  A night bag was also given to the patient and patient was given instruction on how to change from one bag to another. Patient was given proper instruction on catheter care.    Performed by: Zara Council, PA-C   Follow up: 3 weeks for Foley cathete exchange     I,Kailey Littlejohn,acting as a scribe for [employer], PA-C.,have documented all relevant documentation on the behalf of Troy Trinkle, PA-C,as directed by  [facility], PA-C while in the presence of Ysmael Hires, PA-C.

## 2021-05-20 ENCOUNTER — Encounter: Payer: Self-pay | Admitting: Urology

## 2021-05-20 ENCOUNTER — Other Ambulatory Visit: Payer: Self-pay

## 2021-05-20 ENCOUNTER — Ambulatory Visit (INDEPENDENT_AMBULATORY_CARE_PROVIDER_SITE_OTHER): Payer: Medicare HMO | Admitting: Urology

## 2021-05-20 VITALS — BP 161/71 | HR 62 | Ht 71.0 in | Wt 150.0 lb

## 2021-05-20 DIAGNOSIS — R339 Retention of urine, unspecified: Secondary | ICD-10-CM | POA: Diagnosis not present

## 2021-05-20 DIAGNOSIS — Z466 Encounter for fitting and adjustment of urinary device: Secondary | ICD-10-CM | POA: Diagnosis not present

## 2021-06-11 NOTE — Progress Notes (Signed)
Cath Change/ Replacement ? ?Patient is present today for a catheter change due to urinary retention.  9 ml of water was removed from the balloon, a 16 FR coude foley cath was removed with out difficulty.  Patient was cleaned and prepped in a sterile fashion with betadine. A 16 coude FR foley cath was replaced into the bladder no complications were noted Urine return was noted 5 ml and urine was yellow in color. The balloon was filled with 62m of sterile water. A night bag was attached for drainage.  Patient was given proper instruction on catheter care.   ? ?Performed by: SZara Council PA-C  ? ?Follow up: Three weeks for Foley exchange   ?

## 2021-06-12 ENCOUNTER — Ambulatory Visit: Payer: Medicare HMO | Admitting: Urology

## 2021-06-12 ENCOUNTER — Encounter: Payer: Self-pay | Admitting: Urology

## 2021-06-12 ENCOUNTER — Other Ambulatory Visit: Payer: Self-pay

## 2021-06-12 VITALS — BP 158/65 | HR 68 | Temp 98.2°F | Ht 71.0 in

## 2021-06-12 DIAGNOSIS — Z466 Encounter for fitting and adjustment of urinary device: Secondary | ICD-10-CM | POA: Diagnosis not present

## 2021-07-03 NOTE — Progress Notes (Signed)
Cath Change/ Replacement ? ?Patient is present today for a catheter change due to urinary retention.  8 ml of water was removed from the balloon, a 16 FR foley cath was removed with out difficulty.  Patient was cleaned and prepped in a sterile fashion with betadine. A 16 FR foley cath was replaced into the bladder no complications were noted Urine return was noted 30 ml and urine was yellow in color. The balloon was filled with 33m of sterile water.  A night bag was also given to the patient and patient was given instruction on how to change from one bag to another. Patient was given proper instruction on catheter care.   ? ?Performed by: SZara Council PA-C and AKerman Passey CMA ? ?Follow up: Return in about 1 month (around 08/04/2021) for Foley exchange . ? ?

## 2021-07-04 ENCOUNTER — Ambulatory Visit: Payer: Medicare HMO | Admitting: Urology

## 2021-07-04 VITALS — BP 138/60 | HR 56 | Ht 71.0 in | Wt 150.0 lb

## 2021-07-04 DIAGNOSIS — Z466 Encounter for fitting and adjustment of urinary device: Secondary | ICD-10-CM | POA: Diagnosis not present

## 2021-07-22 NOTE — Progress Notes (Signed)
Cath Change/ Replacement ? ?Patient is present today for a catheter change due to urinary retention.  8 ml of water was removed from the balloon, a 16 FR foley cath was removed with out difficulty.  Patient was cleaned and prepped in a sterile fashion with betadine. A 16 FR foley cath was replaced into the bladder no complications were noted Urine return was noted 30 ml and urine was yellow in color. The balloon was filled with 56m of sterile water. A  bag was attached for drainage.  A night bag was also given to the patient and patient was given instruction on how to change from one bag to another. Patient was given proper instruction on catheter care.   ? ?Performed by: SZara Council PA-C  ? ?Follow uCH:ENIDPOin about 1 month (around 08/25/2021) for Foley catheter exchange . ? ?I, KEstill Dooms acting as a sEducation administratorfor [employer] PA-C.,have documented all relevant documentation on the behalf of Daemyn Gariepy, PA-C,as directed by  [facility] PA-C while in the presence of SGillham PA-C. ?

## 2021-07-25 ENCOUNTER — Ambulatory Visit: Payer: Medicare HMO | Admitting: Urology

## 2021-07-26 ENCOUNTER — Ambulatory Visit: Payer: Medicare HMO | Admitting: Urology

## 2021-07-26 DIAGNOSIS — R339 Retention of urine, unspecified: Secondary | ICD-10-CM | POA: Diagnosis not present

## 2021-07-26 DIAGNOSIS — Z466 Encounter for fitting and adjustment of urinary device: Secondary | ICD-10-CM

## 2021-07-27 ENCOUNTER — Encounter: Payer: Self-pay | Admitting: Emergency Medicine

## 2021-07-27 ENCOUNTER — Emergency Department
Admission: EM | Admit: 2021-07-27 | Discharge: 2021-07-27 | Disposition: A | Discharge status: Home / Self Care | Payer: Medicare HMO | Attending: Emergency Medicine | Admitting: Emergency Medicine

## 2021-07-27 ENCOUNTER — Other Ambulatory Visit: Payer: Self-pay

## 2021-07-27 DIAGNOSIS — R339 Retention of urine, unspecified: Secondary | ICD-10-CM

## 2021-07-27 DIAGNOSIS — I129 Hypertensive chronic kidney disease with stage 1 through stage 4 chronic kidney disease, or unspecified chronic kidney disease: Secondary | ICD-10-CM | POA: Insufficient documentation

## 2021-07-27 DIAGNOSIS — N189 Chronic kidney disease, unspecified: Secondary | ICD-10-CM | POA: Diagnosis not present

## 2021-07-27 LAB — URINALYSIS, ROUTINE W REFLEX MICROSCOPIC
Bilirubin Urine: NEGATIVE
Glucose, UA: NEGATIVE mg/dL
Ketones, ur: NEGATIVE mg/dL
Nitrite: NEGATIVE
Protein, ur: 100 mg/dL — AB
Specific Gravity, Urine: 1.013 (ref 1.005–1.030)
WBC, UA: >50 WBC/hpf — ABNORMAL HIGH (ref 0–5)
pH: 6 (ref 5.0–8.0)

## 2021-07-27 MED ORDER — CEPHALEXIN 500 MG PO CAPS: 500.0000 mg | ORAL_CAPSULE | Freq: Four times a day (QID) | ORAL | 0 refills | Status: AC

## 2021-07-27 NOTE — ED Notes (Signed)
Bladder scan = 71 ml.  ?Pts wife stated, she had changed him a couple of times. ?

## 2021-07-27 NOTE — ED Notes (Signed)
Pt has leaking from uretha.  ?

## 2021-07-27 NOTE — Discharge Instructions (Addendum)
-  Follow-up with your urologist as needed. ? ?-Take all of your antibiotics as prescribed. ? ?-Return to the emergency department anytime if you begin to experience any new or worsening symptoms. ?

## 2021-07-27 NOTE — ED Triage Notes (Signed)
Pt via POV from home. Pt c/o urinary retention, states that they went to the urology appt yesterday and they replaced it. Family states that a little bit drained yesterday but none drained all night. Bag is empty on arrival. Pt states he is having pain in his pelvic area.  ?

## 2021-07-27 NOTE — ED Notes (Signed)
Pt stated his urinary cath isn't draining. Was put in yesterday by [facility]. Pts cath has been changed every 3 weeks. No issues since this new cath. Pt c/o pain.  ?

## 2021-07-27 NOTE — ED Provider Notes (Signed)
? ?[facility] ?Provider Note ? ? ? Event Date/Time  ? First MD Initiated Contact with Patient 07/27/21 [phone]   ?  (approximate) ? ? ?History  ? ?Chief Complaint ?Urinary Retention ? ? ?HPI ?Troy Berry. is a 76 y.o. male, history of urinary retention status post chronic Foley, hypertension, CKD, gout, presents emergency department for evaluation of urinary retention.  Patient states that he was at a urology appointment yesterday where they replaced his Foley catheter.  Family states that it drained slightly yesterday, but there has been no drainage since.  Patient reports pain in the pelvic region, as well as subjective fever/chills.  Denies chest pain, shortness of breath, abdominal pain, flank pain, nausea/vomiting, diarrhea, or rashes/lesions ? ?History Limitations: No limitations. ? ?    ? ? ?Physical Exam  ?Triage Vital Signs: ?ED Triage Vitals  ?Enc Vitals Group  ?   BP 07/27/21 0813 (!) 183/70  ?   Pulse Rate 07/27/21 0813 64  ?   Resp 07/27/21 0813 20  ?   Temp 07/27/21 0813 97.8 ?F (36.6 ?C)  ?   Temp Source 07/27/21 0813 Oral  ?   SpO2 07/27/21 0813 100 %  ?   Weight 07/27/21 0812 150 lb (68 kg)  ?   Height 07/27/21 0812 '5\' 11"'$  (1.803 m)  ?   Head Circumference --   ?   Peak Flow --   ?   Pain Score 07/27/21 0812 10  ?   Pain Loc --   ?   Pain Edu? --   ?   Excl. in [place]? --   ? ? ?Most recent vital signs: ?Vitals:  ? 07/27/21 0813  ?BP: (!) 183/70  ?Pulse: 64  ?Resp: 20  ?Temp: 97.8 ?F (36.6 ?C)  ?SpO2: 100%  ? ? ?General: Awake, NAD.  ?Skin: Warm, dry. No rashes or lesions.  ?Eyes: PERRL. Conjunctivae normal.  ?CV: Good peripheral perfusion.  ?Resp: Normal effort.  ?Abd: Soft, non-tender. No distention.  ?Neuro: At baseline. No gross neurological deficits.  ? ?Focused Exam: Mild suprapubic tenderness. No surrounding warmth, tenderness, or swelling in the penile region.  No obvious obstruction in the Foley catheter or bag. Approximately 20 cc of yellow urine in his bag  currently, no sediments or blood appreciated. ? ? ?Physical Exam ? ? ? ?ED Results / Procedures / Treatments  ?Labs ?(all labs ordered are listed, but only abnormal results are displayed) ?Labs Reviewed  ?URINALYSIS, ROUTINE W REFLEX MICROSCOPIC - Abnormal; Notable for the following components:  ?    Result Value  ? Color, Urine YELLOW (*)   ? APPearance CLOUDY (*)   ? Hgb urine dipstick MODERATE (*)   ? Protein, ur 100 (*)   ? Leukocytes,Ua LARGE (*)   ? WBC, UA >50 (*)   ? Bacteria, UA MANY (*)   ? All other components within normal limits  ?URINE CULTURE  ? ? ? ?EKG ?Not applicable. ? ? ?RADIOLOGY ? ?ED Provider Interpretation: Not applicable ? ?No results found. ? ?PROCEDURES: ? ?Critical Care performed: None.  Urinary retention, cystitis. ? ?Procedures ? ? ? ?MEDICATIONS ORDERED IN ED: ?Medications - No data to display ? ? ?IMPRESSION / MDM / ASSESSMENT AND PLAN / ED COURSE  ?I reviewed the triage vital signs and the nursing notes. ?             ?               ? ?  Differential diagnosis includes, but is not limited to, urinary retention, cystitis, Foley catheter obstruction.  ? ?ED Course ?Patient appears well, NAD.  Currently afebrile. ? ?Assessment/Plan ?Patient presents with suspected urinary retention versus UTI.  Foley catheter replaced and was able to drain 1300 milliliters of yellow urine.  Per nurse/tech, it appeared the original Foley catheter from yesterday was potentially placed too shallow.  Urinalysis does show evidence of urinary tract infection as well.  We will provide him a prescription for cephalexin.  Pending cultures.  Advised him to follow-up with his urologist as needed.  We will plan to discharge. ? ?Provided the patient with anticipatory guidance, return precautions, and educational material. Encouraged the patient to return to the emergency department at any time if they begin to experience any new or worsening symptoms. Patient expressed understanding and agreed with the plan.  ? ?   ? ? ?FINAL CLINICAL IMPRESSION(S) / ED DIAGNOSES  ? ?Final diagnoses:  ?Urinary retention  ? ? ? ?Rx / DC Orders  ? ?ED Discharge Orders   ? ?      Ordered  ?  cephALEXin (KEFLEX) 500 MG capsule  4 times daily       ? 07/27/21 0915  ? ?  ?  ? ?  ? ? ? ?Note:  This document was prepared using Dragon voice recognition software and may include unintentional dictation errors. ?  ?Teodoro Spray, [place] ?07/27/21 7017 ? ?  ?Rada Hay, MD ?07/27/21 1327 ? ?

## 2021-07-28 LAB — URINE CULTURE

## 2021-08-16 ENCOUNTER — Ambulatory Visit: Payer: Medicare HMO | Admitting: Physician Assistant

## 2021-08-16 DIAGNOSIS — Z466 Encounter for fitting and adjustment of urinary device: Secondary | ICD-10-CM | POA: Diagnosis not present

## 2021-08-16 DIAGNOSIS — R339 Retention of urine, unspecified: Secondary | ICD-10-CM

## 2021-08-16 NOTE — Progress Notes (Signed)
Cath Change/ Replacement ? ?Patient is present today for a catheter change due to urinary retention.  19m of water was removed from the balloon, a 16FR foley cath was removed without difficulty.  Patient was cleaned and prepped in a sterile fashion with betadine and 2% lidocaine jelly was instilled into the urethra. A 16 FR coude foley cath was replaced into the bladder no complications were noted Urine return was noted 227mand urine was yellow in color. The balloon was filled with 1051mf sterile water. A night bag was attached for drainage.  Patient tolerated well.   ? ?Performed by: SamDebroah LoopA-C  ? ?Follow up: Return in about 3 weeks (around 09/06/2021) for Catheter exchange.   ?

## 2021-09-06 ENCOUNTER — Ambulatory Visit (INDEPENDENT_AMBULATORY_CARE_PROVIDER_SITE_OTHER): Payer: Medicare HMO | Admitting: Physician Assistant

## 2021-09-06 ENCOUNTER — Encounter: Payer: Self-pay | Admitting: Physician Assistant

## 2021-09-06 VITALS — Ht 71.0 in

## 2021-09-06 DIAGNOSIS — R339 Retention of urine, unspecified: Secondary | ICD-10-CM

## 2021-09-06 DIAGNOSIS — Z466 Encounter for fitting and adjustment of urinary device: Secondary | ICD-10-CM

## 2021-09-06 NOTE — Progress Notes (Signed)
Cath Change/ Replacement  Patient is present today for a catheter change due to urinary retention.  59m of water was removed from the balloon, a 16FR coude foley cath was removed without difficulty.  Patient was cleaned and prepped in a sterile fashion with betadine. A 16 FR coude foley cath was replaced into the bladder no complications were noted Urine return was noted 274mand urine was yellow in color. The balloon was filled with 1031mf sterile water. A night bag was attached for drainage.  Patient tolerated well.    Performed by: SamDebroah LoopA-C   Follow up: Return in about 3 weeks (around 09/27/2021) for Catheter exchange.

## 2021-09-27 ENCOUNTER — Ambulatory Visit: Payer: Medicare HMO | Admitting: Physician Assistant

## 2021-09-27 ENCOUNTER — Encounter: Payer: Self-pay | Admitting: Physician Assistant

## 2021-09-27 VITALS — BP 156/67 | HR 57

## 2021-09-27 DIAGNOSIS — Z466 Encounter for fitting and adjustment of urinary device: Secondary | ICD-10-CM | POA: Diagnosis not present

## 2021-09-27 DIAGNOSIS — R339 Retention of urine, unspecified: Secondary | ICD-10-CM | POA: Diagnosis not present

## 2021-09-28 ENCOUNTER — Emergency Department
Admission: EM | Admit: 2021-09-28 | Discharge: 2021-09-28 | Disposition: A | Discharge status: Home / Self Care | Payer: Medicare HMO | Attending: Emergency Medicine | Admitting: Emergency Medicine

## 2021-09-28 ENCOUNTER — Encounter: Payer: Self-pay | Admitting: Emergency Medicine

## 2021-09-28 ENCOUNTER — Other Ambulatory Visit: Payer: Self-pay

## 2021-09-28 DIAGNOSIS — N189 Chronic kidney disease, unspecified: Secondary | ICD-10-CM | POA: Diagnosis not present

## 2021-09-28 DIAGNOSIS — R319 Hematuria, unspecified: Secondary | ICD-10-CM | POA: Insufficient documentation

## 2021-09-28 DIAGNOSIS — I129 Hypertensive chronic kidney disease with stage 1 through stage 4 chronic kidney disease, or unspecified chronic kidney disease: Secondary | ICD-10-CM | POA: Diagnosis not present

## 2021-09-28 DIAGNOSIS — D649 Anemia, unspecified: Secondary | ICD-10-CM

## 2021-09-28 LAB — CBC WITH DIFFERENTIAL/PLATELET
Abs Immature Granulocytes: 0.01 10*3/uL (ref 0.00–0.07)
Basophils Absolute: 0.1 10*3/uL (ref 0.0–0.1)
Basophils Relative: 1 %
Eosinophils Absolute: 0.4 10*3/uL (ref 0.0–0.5)
Eosinophils Relative: 7 %
HCT: 30.5 % — ABNORMAL LOW (ref 39.0–52.0)
Hemoglobin: 9.7 g/dL — ABNORMAL LOW (ref 13.0–17.0)
Immature Granulocytes: 0 %
Lymphocytes Relative: 24 %
Lymphs Abs: 1.2 10*3/uL (ref 0.7–4.0)
MCH: 26.1 pg (ref 26.0–34.0)
MCHC: 31.8 g/dL (ref 30.0–36.0)
MCV: 82.2 fL (ref 80.0–100.0)
Monocytes Absolute: 0.6 10*3/uL (ref 0.1–1.0)
Monocytes Relative: 11 %
Neutro Abs: 2.9 10*3/uL (ref 1.7–7.7)
Neutrophils Relative %: 57 %
Platelets: 154 10*3/uL (ref 150–400)
RBC: 3.71 MIL/uL — ABNORMAL LOW (ref 4.22–5.81)
RDW: 15.1 % (ref 11.5–15.5)
WBC: 5.2 10*3/uL (ref 4.0–10.5)
nRBC: 0 % (ref 0.0–0.2)

## 2021-09-28 LAB — COMPREHENSIVE METABOLIC PANEL
ALT: 9 U/L (ref 0–44)
AST: 12 U/L — ABNORMAL LOW (ref 15–41)
Albumin: 3.6 g/dL (ref 3.5–5.0)
Alkaline Phosphatase: 39 U/L (ref 38–126)
Anion gap: 5 (ref 5–15)
BUN: 56 mg/dL — ABNORMAL HIGH (ref 8–23)
CO2: 22 mmol/L (ref 22–32)
Calcium: 10.6 mg/dL — ABNORMAL HIGH (ref 8.9–10.3)
Chloride: 108 mmol/L (ref 98–111)
Creatinine, Ser: 3.52 mg/dL — ABNORMAL HIGH (ref 0.61–1.24)
GFR, Estimated: 17 mL/min — ABNORMAL LOW (ref >60)
Glucose, Bld: 87 mg/dL (ref 70–99)
Potassium: 4 mmol/L (ref 3.5–5.1)
Sodium: 135 mmol/L (ref 135–145)
Total Bilirubin: 0.3 mg/dL (ref 0.3–1.2)
Total Protein: 6.7 g/dL (ref 6.5–8.1)

## 2021-09-28 NOTE — ED Triage Notes (Signed)
Pt via POV from home. Pt c/o hematuria since this AM. Pt has his catheter changed yesterday. Urine bag has frank blood in it. Denies blood thinner. Denies pain. Still draining at this time, wife states she emptied it this AM. Pt is alert and oriented at this time.

## 2021-10-18 ENCOUNTER — Encounter: Payer: Medicare HMO | Admitting: Physician Assistant

## 2021-10-21 ENCOUNTER — Ambulatory Visit: Payer: Medicare HMO | Admitting: Physician Assistant

## 2021-10-21 ENCOUNTER — Encounter: Payer: Self-pay | Admitting: Physician Assistant

## 2021-10-21 DIAGNOSIS — Z466 Encounter for fitting and adjustment of urinary device: Secondary | ICD-10-CM | POA: Diagnosis not present

## 2021-10-21 DIAGNOSIS — R310 Gross hematuria: Secondary | ICD-10-CM | POA: Diagnosis not present

## 2021-10-21 MED ORDER — CEFTRIAXONE SODIUM 1 G IJ SOLR
1.0000 g | Freq: Once | INTRAMUSCULAR | Status: AC
  Administered 2021-10-21: 1 g via INTRAMUSCULAR

## 2021-10-21 MED ORDER — SULFAMETHOXAZOLE-TRIMETHOPRIM 800-160 MG PO TABS: 1.0000 | ORAL_TABLET | Freq: Two times a day (BID) | ORAL | 0 refills | Status: AC

## 2021-10-21 NOTE — Progress Notes (Signed)
IM Injection  Patient is present today for an IM Injection for treatment of hematuria. Drug: Ceftriaxone 1g Dose:1g Location:Right upper outer buttocks Lot: [id number] Exp:05/2023 Patient tolerated well, no complications were noted  Performed by: Bradly Bienenstock CMA

## 2021-10-21 NOTE — Progress Notes (Signed)
Patient presented to clinic today for scheduled every 3 week Foley catheter change.  He was seen in the emergency department 1 day after I last saw him with reports of gross hematuria.  His urine irrigated clear and urine culture grew multiple species.  History today is very challenging.  It seems that he may not be feeling well and he has been having subjective fevers.  He states he is having pain, but cannot state where.  On physical exam, his Foley catheter is draining pink urine without clots.  We will treat him for acute cystitis with hematuria.  1 g of IM Rocephin was administered in clinic prior to catheter manipulation as below.  I am transitioning him to empiric Bactrim x7 days starting tomorrow.  As below, I hyperinflated the balloon on his new catheter to apply pressure to the prostate and reduce possible prostate bleeding.  If his hematuria persists, recommend cystoscopy.  He would likely benefit from finasteride long-term to reduce his bleeding risk.  Cath Change/ Replacement  Patient is present today for a catheter change due to urinary retention.  35m of water was removed from the balloon, a 16FR coud foley cath was removed without difficulty.  Patient was cleaned and prepped in a sterile fashion with betadine and 2% lidocaine jelly was instilled into the urethra. A 16 FR coud foley cath was replaced into the bladder no complications were noted Urine return was noted 1074mand urine was pink in color. The balloon was filled with 3045mf sterile water. A night bag was attached for drainage.  Patient tolerated well.    Performed by: SamDebroah LoopA-C   Bladder Irrigation  Due to gross hematuria patient is present today for a bladder irrigation. Patient was cleaned and prepped in a sterile fashion. 60 ml of sterile water was instilled and irrigated into the bladder with a 33m51momey syringe through the catheter in place.  Catheter irrigated easily with evacuation of no clot  material.  Efflux remained pink in color.  Catheter was reattached to the night bag and continued to drain without difficulty.  Patient tolerated well.   Performed by: SamaDebroah Loop-C   Follow up: Return in about 3 weeks (around 11/11/2021) for Catheter exchange.

## 2021-10-21 NOTE — Patient Instructions (Signed)
You got a shot of antibiotics in clinic today. The medicine will last until tomorrow morning. Please start your oral antibiotics tomorrow morning.

## 2021-11-12 ENCOUNTER — Ambulatory Visit: Payer: Medicare HMO | Admitting: Physician Assistant

## 2021-11-12 DIAGNOSIS — R339 Retention of urine, unspecified: Secondary | ICD-10-CM | POA: Diagnosis not present

## 2021-11-12 DIAGNOSIS — Z466 Encounter for fitting and adjustment of urinary device: Secondary | ICD-10-CM

## 2021-11-12 DIAGNOSIS — N368 Other specified disorders of urethra: Secondary | ICD-10-CM

## 2021-11-12 DIAGNOSIS — T8389XD Other specified complication of genitourinary prosthetic devices, implants and grafts, subsequent encounter: Secondary | ICD-10-CM

## 2021-11-12 NOTE — Progress Notes (Signed)
Cath Change/ Replacement  Patient is present today for a catheter change due to urinary retention.  80m of water was removed from the balloon, a 16FR coude foley cath was removed without difficulty.  Patient was cleaned and prepped in a sterile fashion with betadine and 2% lidocaine jelly was instilled into the urethra. A 16 FR coude foley cath was replaced into the bladder no complications were noted Urine return was noted 526mand urine was clear in color. The balloon was filled with 1048mf sterile water. A night bag was attached for drainage.  Patient tolerated well.    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 He had previously declined SPT placement. We revisited this conversation today. I had a lengthy conversation with the patient and his wife. I explained again that the erosion will never resolve on its own and all we can do is prevent it from worsening. I am concerned that if it worsens it may extend to the base of the penis. SPT placement would prevent worsening. I reiterated that catheter maintenance would be the same regardless of insertion site. I used a mirror to show the patient the erosion so he could understand exactly what I was talking about. After several explanations, he seemed to understand what I was saying and agreed with SPT placement. Will arrange and book him for 3 week urethral Foley change with me as a backup plan in case he changes his mind or cannot be seen by IR in that timeframe.  Follow up: Return in about 3 weeks (around 12/03/2021) for Catheter exchange.    I spent 15 minutes on the day of the encounter to include pre-visit record review, face-to-face time with the patient, and post-visit ordering of tests.

## 2021-11-28 ENCOUNTER — Telehealth: Payer: Self-pay

## 2021-11-28 DIAGNOSIS — T8389XD Other specified complication of genitourinary prosthetic devices, implants and grafts, subsequent encounter: Secondary | ICD-10-CM

## 2021-11-28 DIAGNOSIS — R339 Retention of urine, unspecified: Secondary | ICD-10-CM

## 2021-11-28 NOTE — Telephone Encounter (Signed)
Message sent to IR for scheduling and form was faxed, order placed

## 2021-11-28 NOTE — Telephone Encounter (Signed)
-----   Message from Debroah Loop, [place] sent at 11/12/2021 12:07 PM EDT ----- Regarding: SPT placement Hi all,  Can you please get him set up for suprapubic catheter placement with IR? Urinary retention with catheter erosion.  Thanks! Sam

## 2021-11-29 ENCOUNTER — Other Ambulatory Visit: Payer: Self-pay | Admitting: Physician Assistant

## 2021-11-29 NOTE — Telephone Encounter (Signed)
Left patient a detailed message notifying him of appointment date and information

## 2021-11-29 NOTE — Telephone Encounter (Signed)
Per IR scheduling, patient is scheduled for Thurs 8/31 at 10a with an arrival time of 9a.  The IR Nurse will call him and go over his instructions prior to his procedure.

## 2021-12-02 NOTE — Telephone Encounter (Signed)
Patients wife notified

## 2021-12-03 ENCOUNTER — Ambulatory Visit: Payer: Medicare HMO | Admitting: Physician Assistant

## 2021-12-04 ENCOUNTER — Other Ambulatory Visit: Payer: Self-pay | Admitting: Internal Medicine

## 2021-12-04 DIAGNOSIS — R339 Retention of urine, unspecified: Secondary | ICD-10-CM

## 2021-12-05 ENCOUNTER — Ambulatory Visit
Admission: RE | Admit: 2021-12-05 | Discharge: 2021-12-05 | Disposition: A | Discharge status: Home / Self Care | Payer: Medicare HMO | Source: Ambulatory Visit | Attending: Physician Assistant | Admitting: Physician Assistant

## 2021-12-05 DIAGNOSIS — R339 Retention of urine, unspecified: Secondary | ICD-10-CM | POA: Insufficient documentation

## 2021-12-05 DIAGNOSIS — N368 Other specified disorders of urethra: Secondary | ICD-10-CM | POA: Insufficient documentation

## 2021-12-05 DIAGNOSIS — T8389XD Other specified complication of genitourinary prosthetic devices, implants and grafts, subsequent encounter: Secondary | ICD-10-CM | POA: Insufficient documentation

## 2021-12-05 LAB — CBC WITH DIFFERENTIAL/PLATELET
Abs Immature Granulocytes: 0.02 10*3/uL (ref 0.00–0.07)
Basophils Absolute: 0.1 10*3/uL (ref 0.0–0.1)
Basophils Relative: 1 %
Eosinophils Absolute: 0.2 10*3/uL (ref 0.0–0.5)
Eosinophils Relative: 4 %
HCT: 28.7 % — ABNORMAL LOW (ref 39.0–52.0)
Hemoglobin: 9 g/dL — ABNORMAL LOW (ref 13.0–17.0)
Immature Granulocytes: 0 %
Lymphocytes Relative: 19 %
Lymphs Abs: 1.1 10*3/uL (ref 0.7–4.0)
MCH: 26.2 pg (ref 26.0–34.0)
MCHC: 31.4 g/dL (ref 30.0–36.0)
MCV: 83.4 fL (ref 80.0–100.0)
Monocytes Absolute: 0.7 10*3/uL (ref 0.1–1.0)
Monocytes Relative: 12 %
Neutro Abs: 3.7 10*3/uL (ref 1.7–7.7)
Neutrophils Relative %: 64 %
Platelets: 199 10*3/uL (ref 150–400)
RBC: 3.44 MIL/uL — ABNORMAL LOW (ref 4.22–5.81)
RDW: 15.9 % — ABNORMAL HIGH (ref 11.5–15.5)
WBC: 5.8 10*3/uL (ref 4.0–10.5)
nRBC: 0 % (ref 0.0–0.2)

## 2021-12-05 LAB — PROTIME-INR
INR: 1.2 (ref 0.8–1.2)
Prothrombin Time: 14.8 seconds (ref 11.4–15.2)

## 2021-12-05 MED ORDER — SODIUM CHLORIDE 0.9 % IV SOLN: INTRAVENOUS | Status: DC

## 2021-12-05 MED ORDER — MIDAZOLAM HCL 2 MG/2ML IJ SOLN
INTRAMUSCULAR | Status: AC
  Filled 2021-12-05: qty 2

## 2021-12-05 MED ORDER — FENTANYL CITRATE (PF) 100 MCG/2ML IJ SOLN
INTRAMUSCULAR | Status: AC
  Filled 2021-12-05: qty 2

## 2021-12-05 MED ORDER — FENTANYL CITRATE (PF) 100 MCG/2ML IJ SOLN
INTRAMUSCULAR | Status: AC | PRN
  Administered 2021-12-05 (×2): 25 ug via INTRAVENOUS

## 2021-12-05 NOTE — Progress Notes (Signed)
Patient clinically stable post 14 Fr SPT placement per Dr Kathlene Cote, tolerated well with local along with Fentanyl 50 mcg IV for procedure. SPT attached to leg bag draining pink tinged urine. Vitals stable pre and post procedure. Report given to [facility] Rn post procedure/specials.

## 2021-12-05 NOTE — H&P (Signed)
Chief Complaint: Patient was seen in consultation today for urinary retention with urethral erosion at the request of [place]  Referring Physician(s): Debroah Loop  Supervising Physician: Aletta Edouard  Patient Status: [facility] - Out-pt  History of Present Illness: Troy Berry. is a 76 y.o. 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 The patient has developed urethral erosion and request received for IR Suprapubic catheter placement.   The patient denies any current chest pain or shortness of breath. He denies any current blood thinner use outside of asa '81mg'$  last taken monday, denies any known bleeding or clotting disorder. He has no known complications to sedation.    Past Medical History:  Diagnosis Date   Anemia    CKD (chronic kidney disease), stage III ([place])    Gout    Hypertension    Urinary retention    in-dwelling Foley since Nov 2019, sees Dr. Hollice Espy   History reviewed. No pertinent surgical history.  Allergies: Ace inhibitors  Medications: Prior to Admission medications   Medication Sig Start Date End Date Taking? Authorizing Provider  acetaminophen (TYLENOL) 325 MG tablet Take 2 tablets (650 mg total) by mouth every 6 (six) hours as needed for mild pain (or Fever >/= 101). 04/30/18   Vaughan Basta, MD  allopurinol (ZYLOPRIM) 300 MG tablet Take 300 mg by mouth daily. 12/08/17   [provider]  amLODipine (NORVASC) 10 MG tablet Take 10 mg by mouth daily.    [provider]  aspirin EC 81 MG tablet Take 81 mg by mouth daily.    [provider]  calcium citrate-vitamin D (CITRACAL+D) 315-200 MG-UNIT tablet Take by mouth.    [provider]  carvedilol (COREG) 25 MG tablet Take 25 mg by mouth 2 (two) times daily with a meal.  05/11/18   [provider]  cholecalciferol (VITAMIN D3) 25 MCG (1000 UT) tablet Take 1,000 Units by mouth daily.    [provider]  Colchicine 0.6 MG CAPS Take by mouth.    [provider]  CRANBERRY-CALCIUM PO Take 1 capsule by mouth daily.    [provider]  furosemide (LASIX) 40 MG tablet Take 40 mg by mouth daily. 07/16/20   [provider]  lovastatin (MEVACOR) 40 MG tablet Take 1 tablet by mouth every evening. 07/16/17   [provider]  polyethylene glycol (MIRALAX) 17 g packet Take 17 g by mouth daily. 08/30/18   Lavonia Drafts, MD     Family History  Problem Relation Age of Onset   Cancer Father     Social History   Socioeconomic History   Marital status: Married    Spouse name: Not on file   Number of children: Not on file   Years of education: Not on file   Highest education level: Not on file  Occupational History   Not on file  Tobacco Use   Smoking status: Former    Types: Cigarettes    Quit date: 04/07/1998    Years since quitting: 23.6    Passive exposure: Past   Smokeless tobacco: Never  Vaping Use   Vaping Use: Never used  Substance and Sexual Activity   Alcohol use: Yes   Drug use: Never   Sexual activity: Not Currently    Birth control/protection: None  Other Topics Concern   Not on file  Social History Narrative  Lives at home with family   Social Determinants of Health   Financial Resource Strain: Low Risk  (04/28/2018)   Overall Financial Resource Strain (CARDIA)    Difficulty of Paying Living Expenses: Not hard at all  Food Insecurity: No Food Insecurity (04/28/2018)   Hunger Vital Sign    Worried About Running Out of Food in the Last Year: Never true    Ran Out of Food in the Last Year: Never true  Transportation Needs: No Transportation Needs (04/28/2018)   PRAPARE - Hydrologist (Medical): No    Lack of Transportation (Non-Medical): No  Physical Activity: Inactive (04/28/2018)    Exercise Vital Sign    Days of Exercise per Week: 0 days    Minutes of Exercise per Session: 0 min  Stress: No Stress Concern Present (04/28/2018)   [place]    Feeling of Stress : Not at all  Social Connections: Moderately Isolated (04/28/2018)   Social Connection and Isolation Panel [NHANES]    Frequency of Communication with Friends and Family: Once a week    Frequency of Social Gatherings with Friends and Family: Once a week    Attends Religious Services: Never    Marine scientist or Organizations: No    Attends Music therapist: Never    Marital Status: Married    Review of Systems: A 12 point ROS discussed and pertinent positives are indicated in the HPI above.  All other systems are negative.  Review of Systems  Vital Signs: BP (!) 172/64   Pulse (!) 56   Temp 98.4 F (36.9 C)   Resp 16   Ht '5\' 9"'$  (1.753 m)   Wt 160 lb 15 oz (73 kg)   SpO2 97%   BMI 23.77 kg/m   Physical Exam Constitutional:      Appearance: Normal appearance.  HENT:     Head: Normocephalic and atraumatic.  Cardiovascular:     Rate and Rhythm: Regular rhythm. Bradycardia present.  Pulmonary:     Effort: Pulmonary effort is normal. No respiratory distress.  Genitourinary:    Comments: Urinary foley catheter in place Neurological:     Mental Status: He is alert and oriented to person, place, and time.    Imaging: No results found.  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 The patient has developed urethral erosion and request received for IR Suprapubic catheter placement.   The patient has been NPO, no blood thinners taken except ASA '81mg'$  Monday- Dr. Kathlene Cote aware and ok to proceed today, imaging, labs and vitals have been reviewed.  Risks and  benefits of image guided suprapubic catheter placement with moderate sedation was discussed with the patient and/or patient's family including, but not limited to bleeding, infection, damage to adjacent structures and need for additional procedures for upsize and catheter exchanges.   All of the questions were answered and there is agreement to proceed.  Consent signed and in chart.  Thank you for this interesting consult.  I greatly enjoyed meeting Akbar Sacra. and look forward to participating in their care.  A copy of this report was sent to the requesting provider on this date.  Electronically Signed: Hedy Jacob, PA-C 12/05/2021, 10:42 AM   I spent a total of 15 Minutes in face to face in clinical consultation, greater than 50% of which was counseling/coordinating care for urinary retention with urethral erosion.

## 2021-12-05 NOTE — Procedures (Signed)
Interventional Radiology Procedure Note  Procedure: Suprapubic bladder catheter placement  Complications: None  Estimated Blood Loss: None  Findings: 14 Fr pigtail drain placed in bladder from midline suprapubic approach. Good return of urine. Foley catheter will be removed.  Plan: Upsize to 16 Fr balloon retention catheter in IR in 4 weeks.  Venetia Night. Kathlene Cote, M.D Pager:  [phone]

## 2021-12-12 ENCOUNTER — Other Ambulatory Visit: Payer: Self-pay | Admitting: Interventional Radiology

## 2021-12-12 DIAGNOSIS — R339 Retention of urine, unspecified: Secondary | ICD-10-CM

## 2021-12-27 ENCOUNTER — Other Ambulatory Visit (HOSPITAL_COMMUNITY): Payer: Self-pay | Admitting: Radiology

## 2021-12-27 DIAGNOSIS — R339 Retention of urine, unspecified: Secondary | ICD-10-CM

## 2021-12-30 ENCOUNTER — Ambulatory Visit
Admission: RE | Admit: 2021-12-30 | Discharge: 2021-12-30 | Disposition: A | Discharge status: Home / Self Care | Payer: Medicare HMO | Source: Ambulatory Visit | Attending: Interventional Radiology | Admitting: Interventional Radiology

## 2021-12-30 DIAGNOSIS — R339 Retention of urine, unspecified: Secondary | ICD-10-CM | POA: Diagnosis not present

## 2021-12-30 DIAGNOSIS — Z960 Presence of urogenital implants: Secondary | ICD-10-CM | POA: Insufficient documentation

## 2021-12-30 HISTORY — PX: IR CATHETER TUBE CHANGE: IMG717

## 2021-12-30 MED ORDER — LIDOCAINE VISCOUS HCL 2 % MT SOLN
OROMUCOSAL | Status: AC
  Administered 2021-12-30: 10 mL
  Filled 2021-12-30: qty 15

## 2021-12-30 MED ORDER — LIDOCAINE HCL 1 % IJ SOLN
INTRAMUSCULAR | Status: AC
  Administered 2021-12-30: 10 mL
  Filled 2021-12-30: qty 20

## 2021-12-30 MED ORDER — IOHEXOL 300 MG/ML  SOLN
5.0000 mL | Freq: Once | INTRAMUSCULAR | Status: AC | PRN
  Administered 2021-12-30: 5 mL

## 2022-01-01 ENCOUNTER — Ambulatory Visit
Admission: RE | Admit: 2022-01-01 | Discharge: 2022-01-01 | Disposition: A | Discharge status: Home / Self Care | Payer: Medicare HMO | Source: Ambulatory Visit | Attending: Physician Assistant | Admitting: Physician Assistant

## 2022-01-01 ENCOUNTER — Ambulatory Visit: Payer: Medicare HMO | Admitting: Urology

## 2022-01-01 ENCOUNTER — Ambulatory Visit (INDEPENDENT_AMBULATORY_CARE_PROVIDER_SITE_OTHER): Payer: Medicare HMO | Admitting: Urology

## 2022-01-01 ENCOUNTER — Encounter: Payer: Self-pay | Admitting: Urology

## 2022-01-01 ENCOUNTER — Other Ambulatory Visit: Payer: Self-pay | Admitting: Physician Assistant

## 2022-01-01 DIAGNOSIS — T83010A Breakdown (mechanical) of cystostomy catheter, initial encounter: Secondary | ICD-10-CM

## 2022-01-01 DIAGNOSIS — R339 Retention of urine, unspecified: Secondary | ICD-10-CM

## 2022-01-01 HISTORY — PX: IR RADIOLOGIST EVAL & MGMT: IMG5224

## 2022-01-01 NOTE — Progress Notes (Addendum)
Supervising Physician: Troy Berry  Patient Status:  [name] outpatient  Chief Complaint:  Possible leaking SP catheter  Brief History:  Troy Berry. is a 76 y.o. 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   He developed urethral erosion and underwent Suprapubic catheter placement on 8/31 with exchange/upsize this past Monday.  His wife called today and stated he was wet all over. She presumed his catheter was leaking.  Allergies: Ace inhibitors  Medications: Prior to Admission medications   Medication Sig Start Date End Date Taking? Authorizing Provider  acetaminophen (TYLENOL) 325 MG tablet Take 2 tablets (650 mg total) by mouth every 6 (six) hours as needed for mild pain (or Fever >/= 101). 04/30/18   Vaughan Basta, MD  allopurinol (ZYLOPRIM) 300 MG tablet Take 300 mg by mouth daily. 12/08/17   [provider]  amLODipine (NORVASC) 10 MG tablet Take 10 mg by mouth daily.    [provider]  aspirin EC 81 MG tablet Take 81 mg by mouth daily.    [provider]  calcium citrate-vitamin D (CITRACAL+D) 315-200 MG-UNIT tablet Take by mouth.    [provider]  carvedilol (COREG) 25 MG tablet Take 25 mg by mouth 2 (two) times daily with a meal.  05/11/18   [provider]  cholecalciferol (VITAMIN D3) 25 MCG (1000 UT) tablet Take 1,000 Units by mouth daily.    [provider]  Colchicine 0.6 MG CAPS Take by mouth.    [provider]  CRANBERRY-CALCIUM PO Take 1 capsule by mouth daily.    [provider]  furosemide (LASIX) 40 MG tablet Take 40 mg by mouth daily. 07/16/20   [provider]  lovastatin (MEVACOR) 40 MG tablet Take 1 tablet by mouth every evening. 07/16/17   [provider]  polyethylene glycol (MIRALAX) 17 g packet Take 17 g by  mouth daily. 08/30/18   Lavonia Drafts, MD     Vital Signs: There were no vitals taken for this visit.  Physical Exam Awake and alert NAD SP tube site looks good and dressing was dry upon removal. No evidence of leakage at the site. I examined the tubing and the Christmas tree adapter and it appeared to be intact. There was no leakage. There is red tinged urine in the foley bag. I examined the penile area as well and notice some dampness in the depends.  It appears he may be leaking from his urethra.  Imaging: IR Catheter Tube Change  Result Date: 12/30/2021 INDICATION: 76 year old male with urinary retention due to detrusor under activity status post suprapubic catheter placement on 12/05/2021. EXAM: FLUOROSCOPIC GUIDED SUPRAPUBIC CATHETER EXCHANGE COMPARISON:  12/05/2021 MEDICATIONS: None ANESTHESIA/SEDATION: None CONTRAST:  5 mL Omnipaque 500, administered into the urinary bladder FLUOROSCOPY TIME:  2.6 mGy COMPLICATIONS: None immediate. PROCEDURE: The procedure, risks, benefits, and alternatives were explained to the patient. Questions regarding the procedure were encouraged and answered. The patient understands and consents to the procedure. A timeout was performed prior to the initiation of the procedure. The external portion of the existing suprapubic catheter as well as the surrounding skin were prepped and draped in usual sterile fashion A preprocedural spot fluoroscopic image was obtained of the lower pelvis and existing 14 French all-purpose drainage catheter with end coiled and locked overlying the expected location of the urinary bladder. Contrast injection confirmed appropriate positioning  and functionality existing suprapubic catheter. The external portion of the existing suprapubic catheter was cut and cannulated with a short Amplatz wire which was coiled within the urinary bladder. Under intermittent fluoroscopic guidance, after serial dilation, the existing all-purpose drainage  catheter was exchanged for a new 16 Fr Council. Appropriate positioning was confirmed with the injection of a small amount of contrast as well as the efflux of urine. The catheter was connected to a gravity bag. A dressing was placed. The patient tolerated the procedure well without immediate postprocedural complication. FINDINGS: Preprocedural imaging demonstrates unchanged positioning of all-purpose drainage catheter with end coiled and locked over the expected location of the urinary bladder. Contrast injection confirms appropriate positioning and functionality of the existing percutaneous drainage catheter. After fluoroscopic guided exchange, a new 16 Fr Council catheter is appropriately positioned within the urinary bladder. IMPRESSION: Technically successful fluoroscopic guided upsize of suprapubic catheter (14 Fr pigtail for 16 Fr Council) for the purposes of chronic urinary bladder decompression. PLAN: Follow-up up as per Urology. Ruthann Cancer, MD [facility] Electronically Signed   By: Ruthann Cancer M.D.   On: 12/30/2021 11:20    Labs:  CBC: Recent Labs    09/28/21 0739 12/05/21 0940  WBC 5.2 5.8  HGB 9.7* 9.0*  HCT 30.5* 28.7*  PLT 154 199    COAGS: Recent Labs    12/05/21 0940  INR 1.2    BMP: Recent Labs    09/28/21 0739  NA 135  K 4.0  CL 108  CO2 22  GLUCOSE 87  BUN 56*  CALCIUM 10.6*  CREATININE 3.52*  GFRNONAA 17*    LIVER FUNCTION TESTS: Recent Labs    09/28/21 0739  BILITOT 0.3  AST 12*  ALT 9  ALKPHOS 39  PROT 6.7  ALBUMIN 3.6    Assessment and Plan:  S/p SP tube exchange on Monday = exam shows site is clean and dry, no evidence of leakage, but there was evidence of leakage in the pull up depends. It appears the complaint corresponds with urinary incontinence.  Recommend evaluation by Urology.  Clean dressing placed.  Electronically Signed: Murrell Redden, PA-C 01/01/2022, 3:33  PM    I spent a total of 15 Minutes at the the patient's bedside AND on the patient's hospital floor or unit, greater than 50% of which was counseling/coordinating care for evaluation of SP tube/leaking.

## 2022-01-01 NOTE — Progress Notes (Signed)
The [facility] came in today stating that they were told by IR to come see Troy Berry as there was leaking from his penis.  Upon examination, the council tip catheter was not secured appropriately to the dowel leg strap and was on tension in the drainage tubing was curled up and wrapped around his leg.  I explained that he needs to keep slack in the line and demonstrated where it is appropriate to attach the catheter to the double strap I also explained the need to keep the catheter tubing unkinked and below the bladder as gravity cannot push urine up and above the tube and it will cause the urine to be backed up into the bladder and not allow to drain appropriately.  I did advise that it will be normal to see leakage from the penis from time to time, but to notify Troy Berry if Mr. Tora Perches she complaining of suprapubic pain or the suprapubic tube stops draining.  I spent 15 minutes on the day of the encounter to include pre-visit record review, face-to-face time with the patient, and post-visit ordering of tests.

## 2022-01-02 ENCOUNTER — Ambulatory Visit: Payer: Medicare HMO | Admitting: Radiology

## 2022-01-03 ENCOUNTER — Other Ambulatory Visit: Payer: Self-pay

## 2022-01-03 ENCOUNTER — Inpatient Hospital Stay
Admission: EM | Admit: 2022-01-03 | Discharge: 2022-01-31 | DRG: 353 | Disposition: A | Discharge status: Skilled Nursing Facility | Payer: Medicare HMO | Attending: Family Medicine | Admitting: Family Medicine

## 2022-01-03 ENCOUNTER — Observation Stay: Payer: Medicare HMO

## 2022-01-03 ENCOUNTER — Encounter: Payer: Self-pay | Admitting: Internal Medicine

## 2022-01-03 ENCOUNTER — Emergency Department: Payer: Medicare HMO

## 2022-01-03 DIAGNOSIS — Z23 Encounter for immunization: Secondary | ICD-10-CM

## 2022-01-03 DIAGNOSIS — E876 Hypokalemia: Secondary | ICD-10-CM | POA: Diagnosis present

## 2022-01-03 DIAGNOSIS — T839XXA Unspecified complication of genitourinary prosthetic device, implant and graft, initial encounter: Secondary | ICD-10-CM

## 2022-01-03 DIAGNOSIS — K56609 Unspecified intestinal obstruction, unspecified as to partial versus complete obstruction: Secondary | ICD-10-CM | POA: Diagnosis not present

## 2022-01-03 DIAGNOSIS — D62 Acute posthemorrhagic anemia: Secondary | ICD-10-CM | POA: Diagnosis present

## 2022-01-03 DIAGNOSIS — T83030A Leakage of cystostomy catheter, initial encounter: Secondary | ICD-10-CM | POA: Diagnosis present

## 2022-01-03 DIAGNOSIS — E871 Hypo-osmolality and hyponatremia: Secondary | ICD-10-CM | POA: Diagnosis present

## 2022-01-03 DIAGNOSIS — R319 Hematuria, unspecified: Principal | ICD-10-CM

## 2022-01-03 DIAGNOSIS — K439 Ventral hernia without obstruction or gangrene: Secondary | ICD-10-CM | POA: Diagnosis present

## 2022-01-03 DIAGNOSIS — I10 Essential (primary) hypertension: Secondary | ICD-10-CM | POA: Diagnosis present

## 2022-01-03 DIAGNOSIS — T83090A Other mechanical complication of cystostomy catheter, initial encounter: Secondary | ICD-10-CM | POA: Diagnosis present

## 2022-01-03 DIAGNOSIS — F03911 Unspecified dementia, unspecified severity, with agitation: Secondary | ICD-10-CM | POA: Diagnosis not present

## 2022-01-03 DIAGNOSIS — R531 Weakness: Secondary | ICD-10-CM

## 2022-01-03 DIAGNOSIS — R310 Gross hematuria: Secondary | ICD-10-CM | POA: Diagnosis present

## 2022-01-03 DIAGNOSIS — T8130XA Disruption of wound, unspecified, initial encounter: Secondary | ICD-10-CM | POA: Diagnosis present

## 2022-01-03 DIAGNOSIS — Z992 Dependence on renal dialysis: Secondary | ICD-10-CM

## 2022-01-03 DIAGNOSIS — N390 Urinary tract infection, site not specified: Secondary | ICD-10-CM | POA: Diagnosis present

## 2022-01-03 DIAGNOSIS — N138 Other obstructive and reflux uropathy: Secondary | ICD-10-CM | POA: Diagnosis present

## 2022-01-03 DIAGNOSIS — M109 Gout, unspecified: Secondary | ICD-10-CM | POA: Diagnosis present

## 2022-01-03 DIAGNOSIS — N312 Flaccid neuropathic bladder, not elsewhere classified: Secondary | ICD-10-CM | POA: Diagnosis present

## 2022-01-03 DIAGNOSIS — R1030 Lower abdominal pain, unspecified: Secondary | ICD-10-CM | POA: Diagnosis not present

## 2022-01-03 DIAGNOSIS — K9189 Other postprocedural complications and disorders of digestive system: Secondary | ICD-10-CM | POA: Diagnosis present

## 2022-01-03 DIAGNOSIS — Z888 Allergy status to other drugs, medicaments and biological substances status: Secondary | ICD-10-CM

## 2022-01-03 DIAGNOSIS — D649 Anemia, unspecified: Secondary | ICD-10-CM | POA: Diagnosis not present

## 2022-01-03 DIAGNOSIS — K469 Unspecified abdominal hernia without obstruction or gangrene: Secondary | ICD-10-CM | POA: Diagnosis present

## 2022-01-03 DIAGNOSIS — Y738 Miscellaneous gastroenterology and urology devices associated with adverse incidents, not elsewhere classified: Secondary | ICD-10-CM | POA: Diagnosis present

## 2022-01-03 DIAGNOSIS — Z79899 Other long term (current) drug therapy: Secondary | ICD-10-CM

## 2022-01-03 DIAGNOSIS — N184 Chronic kidney disease, stage 4 (severe): Secondary | ICD-10-CM | POA: Diagnosis present

## 2022-01-03 DIAGNOSIS — I129 Hypertensive chronic kidney disease with stage 1 through stage 4 chronic kidney disease, or unspecified chronic kidney disease: Secondary | ICD-10-CM | POA: Diagnosis present

## 2022-01-03 DIAGNOSIS — K560 Paralytic ileus: Secondary | ICD-10-CM

## 2022-01-03 DIAGNOSIS — N401 Enlarged prostate with lower urinary tract symptoms: Secondary | ICD-10-CM | POA: Diagnosis present

## 2022-01-03 DIAGNOSIS — Z681 Body mass index (BMI) 19 or less, adult: Secondary | ICD-10-CM

## 2022-01-03 DIAGNOSIS — K56601 Complete intestinal obstruction, unspecified as to cause: Principal | ICD-10-CM | POA: Diagnosis present

## 2022-01-03 DIAGNOSIS — Z515 Encounter for palliative care: Secondary | ICD-10-CM

## 2022-01-03 DIAGNOSIS — N179 Acute kidney failure, unspecified: Secondary | ICD-10-CM

## 2022-01-03 DIAGNOSIS — K567 Ileus, unspecified: Secondary | ICD-10-CM | POA: Diagnosis present

## 2022-01-03 DIAGNOSIS — N170 Acute kidney failure with tubular necrosis: Secondary | ICD-10-CM | POA: Diagnosis present

## 2022-01-03 DIAGNOSIS — D509 Iron deficiency anemia, unspecified: Secondary | ICD-10-CM | POA: Diagnosis present

## 2022-01-03 DIAGNOSIS — E870 Hyperosmolality and hypernatremia: Secondary | ICD-10-CM | POA: Diagnosis not present

## 2022-01-03 DIAGNOSIS — T83010A Breakdown (mechanical) of cystostomy catheter, initial encounter: Secondary | ICD-10-CM | POA: Diagnosis present

## 2022-01-03 DIAGNOSIS — F0390 Unspecified dementia without behavioral disturbance: Secondary | ICD-10-CM | POA: Diagnosis present

## 2022-01-03 DIAGNOSIS — Z87891 Personal history of nicotine dependence: Secondary | ICD-10-CM

## 2022-01-03 DIAGNOSIS — E43 Unspecified severe protein-calorie malnutrition: Secondary | ICD-10-CM | POA: Diagnosis present

## 2022-01-03 DIAGNOSIS — D631 Anemia in chronic kidney disease: Secondary | ICD-10-CM | POA: Diagnosis present

## 2022-01-03 DIAGNOSIS — E872 Acidosis, unspecified: Secondary | ICD-10-CM | POA: Diagnosis present

## 2022-01-03 LAB — COMPREHENSIVE METABOLIC PANEL
ALT: 12 U/L (ref 0–44)
AST: 13 U/L — ABNORMAL LOW (ref 15–41)
Albumin: 3.1 g/dL — ABNORMAL LOW (ref 3.5–5.0)
Alkaline Phosphatase: 46 U/L (ref 38–126)
Anion gap: 16 — ABNORMAL HIGH (ref 5–15)
BUN: 95 mg/dL — ABNORMAL HIGH (ref 8–23)
CO2: 16 mmol/L — ABNORMAL LOW (ref 22–32)
Calcium: 10.2 mg/dL (ref 8.9–10.3)
Chloride: 96 mmol/L — ABNORMAL LOW (ref 98–111)
Creatinine, Ser: 4.85 mg/dL — ABNORMAL HIGH (ref 0.61–1.24)
GFR, Estimated: 12 mL/min — ABNORMAL LOW (ref >60)
Glucose, Bld: 119 mg/dL — ABNORMAL HIGH (ref 70–99)
Potassium: 3.6 mmol/L (ref 3.5–5.1)
Sodium: 128 mmol/L — ABNORMAL LOW (ref 135–145)
Total Bilirubin: 0.7 mg/dL (ref 0.3–1.2)
Total Protein: 6.7 g/dL (ref 6.5–8.1)

## 2022-01-03 LAB — CBC
HCT: 23.2 % — ABNORMAL LOW (ref 39.0–52.0)
Hemoglobin: 7.7 g/dL — ABNORMAL LOW (ref 13.0–17.0)
MCH: 26.6 pg (ref 26.0–34.0)
MCHC: 33.2 g/dL (ref 30.0–36.0)
MCV: 80 fL (ref 80.0–100.0)
Platelets: 272 10*3/uL (ref 150–400)
RBC: 2.9 MIL/uL — ABNORMAL LOW (ref 4.22–5.81)
RDW: 15.3 % (ref 11.5–15.5)
WBC: 10 10*3/uL (ref 4.0–10.5)
nRBC: 0 % (ref 0.0–0.2)

## 2022-01-03 LAB — LACTIC ACID, PLASMA: Lactic Acid, Venous: 0.9 mmol/L (ref 0.5–1.9)

## 2022-01-03 MED ORDER — ACETAMINOPHEN 650 MG RE SUPP: 650.0000 mg | Freq: Four times a day (QID) | RECTAL | Status: DC | PRN

## 2022-01-03 MED ORDER — FERROUS SULFATE 325 (65 FE) MG PO TABS
325.0000 mg | ORAL_TABLET | Freq: Every day | ORAL | Status: DC
  Administered 2022-01-03: 325 mg via ORAL
  Filled 2022-01-03: qty 1

## 2022-01-03 MED ORDER — PIPERACILLIN-TAZOBACTAM 3.375 G IVPB 30 MIN
3.3750 g | Freq: Once | INTRAVENOUS | Status: AC
  Administered 2022-01-03: 3.375 g via INTRAVENOUS
  Filled 2022-01-03: qty 50

## 2022-01-03 MED ORDER — ACETAMINOPHEN 325 MG PO TABS: 650.0000 mg | ORAL_TABLET | Freq: Four times a day (QID) | ORAL | Status: DC | PRN

## 2022-01-03 MED ORDER — SODIUM CHLORIDE 0.9% FLUSH
3.0000 mL | Freq: Two times a day (BID) | INTRAVENOUS | Status: DC
  Administered 2022-01-04 – 2022-01-31 (×41): 3 mL via INTRAVENOUS

## 2022-01-03 MED ORDER — IOTHALAMATE MEGLUMINE 17.2 % UR SOLN
250.0000 mL | Freq: Once | URETHRAL | Status: AC | PRN
  Administered 2022-01-03: 250 mL via INTRAVESICAL

## 2022-01-03 MED ORDER — SODIUM CHLORIDE 0.9 % IV BOLUS
500.0000 mL | Freq: Once | INTRAVENOUS | Status: AC
  Administered 2022-01-03: 500 mL via INTRAVENOUS

## 2022-01-03 MED ORDER — AMLODIPINE BESYLATE 10 MG PO TABS
10.0000 mg | ORAL_TABLET | Freq: Every day | ORAL | Status: DC
  Administered 2022-01-04: 10 mg via ORAL
  Filled 2022-01-03: qty 1

## 2022-01-03 MED ORDER — SODIUM CHLORIDE 0.9 % IV SOLN
250.0000 mL | INTRAVENOUS | Status: DC | PRN
  Administered 2022-01-22: 250 mL via INTRAVENOUS

## 2022-01-03 MED ORDER — SODIUM CHLORIDE 0.9% FLUSH: 3.0000 mL | INTRAVENOUS | Status: DC | PRN

## 2022-01-03 MED ORDER — PRAVASTATIN SODIUM 20 MG PO TABS
40.0000 mg | ORAL_TABLET | Freq: Every day | ORAL | Status: DC
  Administered 2022-01-04: 40 mg via ORAL
  Filled 2022-01-03: qty 2

## 2022-01-03 MED ORDER — ALLOPURINOL 100 MG PO TABS
300.0000 mg | ORAL_TABLET | Freq: Every day | ORAL | Status: DC
  Administered 2022-01-04: 300 mg via ORAL
  Filled 2022-01-03: qty 3

## 2022-01-03 MED ORDER — CARVEDILOL 25 MG PO TABS
25.0000 mg | ORAL_TABLET | Freq: Two times a day (BID) | ORAL | Status: DC
  Administered 2022-01-04 (×2): 25 mg via ORAL
  Filled 2022-01-03 (×2): qty 1

## 2022-01-03 MED ORDER — SODIUM CHLORIDE 0.9% FLUSH
3.0000 mL | Freq: Two times a day (BID) | INTRAVENOUS | Status: DC
  Administered 2022-01-03: 3 mL via INTRAVENOUS

## 2022-01-03 NOTE — Assessment & Plan Note (Addendum)
Patient has a history of atonic bladder requiring suprapubic catheter placement approximately 1 month ago on 12/05/2021.  On 12/30/2021, catheter was replaced by IR.  Since that time, patient has been experiencing difficulties including hematuria and poor urinary drainage.  He was reevaluated by urology and IR on 9/27 without any improvement.  On admission, it was discovered that suprapubic catheter tip was not in the bladder but adjacent to the small bowel.  Urology consulted and suprapubic catheter replaced with cystogram demonstrating appropriate placement within bladder.  - Urology following; appreciate their recommendations.

## 2022-01-03 NOTE — ED Triage Notes (Signed)
Pt here with urinary catheter problems. Pt had a suprapubic catheter inserted last month but it does not go into the bag. Wife states his briefs are wet and he also has some swelling in his abd. Pt states abd pain.

## 2022-01-03 NOTE — Assessment & Plan Note (Addendum)
Due to acute blood loss anemia. Patient has a history of anemia of chronic kidney disease on oral iron supplementation.  Hemoglobin on admission below baseline at 7.7.  Likely in the setting of large volume hematuria over the previous 5 days prior to admission.  On 9/30, 10/12, & 10/16 -1 PRBC transfusion on each of those dates.  Total 3 PRBC transfusion.  We will recheck CBC tomorrow

## 2022-01-03 NOTE — Assessment & Plan Note (Addendum)
Secondary to suprapubic catheter dysfunction

## 2022-01-03 NOTE — Assessment & Plan Note (Signed)
Sodium decreased at 128 on admission. Suspect this is secondary to acute on chronic renal failure.  Given gross hematuria, unable to obtain urine studies for further evaluation.  We will recheck sodium levels tomorrow morning.  No indication for patient has received 500 cc of normal saline thus far.  -Repeat sodium level in the a.m.

## 2022-01-03 NOTE — Consult Note (Signed)
Urology Consult   I have been asked to see the patient by Dr. Kerman Passey, for evaluation and management of SP tube malpositioning, possible bladder injury.  Chief Complaint: Non-draining SP tube, gross hematuria  HPI:  Troy Berry. is a 76 y.o. 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  He has been followed primarily by our PA's Debroah Loop and Zara Council in urology clinic.  He developed urethral erosion and opted for bladder management with suprapubic tube, which was originally placed by interventional radiology on 12/05/2021.  It sounds like this tube was draining well.  He then underwent upsizing of that tube to a 16 [place] with interventional radiology on 12/30/2021.  The tube has not been draining well since that time and has had problems with gross hematuria in the tubing as well as leakage from his urethra.  He was seen by IR in clinic on 9/27 and by Zara Council in clinic 9/27.  I do not not see that the catheter was irrigated at any point, but the strap was re-secured.  He had a bowel movement yesterday after his wife gave him Dulcolax.  She denies any changes in his bowel movements.  He has had some lower abdominal pain and abdominal distention.  Most of the history is provided by his wife, as well as daughter over the phone.    PMH: Past Medical History:  Diagnosis Date   Anemia    CKD (chronic kidney disease), stage III ([place])    Gout    Hypertension    Urinary retention    in-dwelling Foley since Nov 2019, sees Dr. Hollice Espy    Allergies:  Allergies  Allergen Reactions   Ace Inhibitors     Other reaction(s): Other (See Comments) lip swelling    Family History: Family History  Problem Relation Age of Onset   Cancer Father     Social History:  reports that he quit smoking about 23 years ago. His smoking use included cigarettes. He has been exposed to tobacco smoke. He has never used  smokeless tobacco. He reports current alcohol use. He reports that he does not use drugs.  ROS: Negative aside from those stated in the HPI.  Physical Exam: BP (!) 94/55   Pulse 73   Temp 98.5 F (36.9 C) (Oral)   Resp 16   Ht '5\' 9"'$  (1.753 m)   Wt 73 kg   SpO2 96%   BMI 23.77 kg/m    Constitutional:  Alert and oriented, No acute distress. Cardiovascular: No clubbing, cyanosis, or edema. Respiratory: Normal respiratory effort, no increased work of breathing. GI: Abdomen is distended, mildly tender throughout, non-peritonitic Suprapubic tube with maroon-colored urine   Laboratory Data: Reviewed in epic Hematocrit 23.2(28.7 73-monthago) Creatinine 4.85(3.5 in June 2023)  Pertinent Imaging: I have personally reviewed the CT stone protocol showing suprapubic catheter with the balloon positioned outside of the bladder in the posterior pelvis adjacent to small bowel loop, as well as multiple dilated loops of small bowel with no definite transition point.  I also reviewed the IR images from 12/30/2021 that appear to show the suprapubic tube outside of the bladder lumen  I ordered and reviewed the CT cystogram this evening that shows suprapubic tube now in appropriate position within the bladder, what looks like likely extraperitoneal bladder perforation with some contrast filling the fistulous tract in the shape of the prior balloon that was inflated outside the bladder,  no obvious evidence of contrast surrounding bowel that would indicate intraperitoneal injury.  There is a small amount of retained contrast in the left pelvis after drainage, but bladder decompresses.  Assessment & Plan:   76 year old male with atonic bladder and chronic urethral Foley since 2019, recently changed by interventional radiology to suprapubic tube that was originally placed on 12/05/2021.  This tube was upsized by interventional radiology on 12/30/2021, and it appears that the Foley was placed through and  through the bladder posteriorly.  He presented to the ER today with gross hematuria in the tubing, worsening lower abdominal pain, and weakness.  At the bedside I deflated 10 mL of fluid from the balloon and this was retracted 4 cm into the anticipated location of the bladder.  The balloon was reinflated, and I gently irrigated 50 mL of saline.  The catheter irrigated well and urine was dark pink, no clots or debris noted.  I had a long conversation with the patient, his family, and his daughter on speaker phone.  I was very frank with the patient that I am concerned about bladder injury and potentially bowel injury based on CT findings suggesting abnormal positioning of Foley catheter from exchange on 12/30/2021.  We reviewed that if there is an extraperitoneal bladder injury with no bowel injury, this can often be managed conservatively with Foley catheter, which in his case is chronic at baseline.  However, if there is an intraperitoneal bladder injury these typically would be repaired surgically, however with his age and comorbidities he would certainly be high risk, and it may be worth a trial of conservative management with indwelling Foley.  We also discussed the risk of possible bowel injury in the setting of his abdominal distention and dilated bowel loops on CT.   Recommendations: -General surgery consult, I discussed the case with Dr. Windell Moment -Agree with antibiotics and hospitalist admission -Okay to gently irrigate suprapubic tube if needed -Maintain SP tube  [name], MD  Total time spent on the floor was 90 minutes, with greater than 50% spent in counseling and coordination of care with the patient and family regarding misplaced suprapubic tube, need for further imaging to identify extent of bladder/possible bowel injury, ileus, need for admission for antibiotics and monitoring.  [place] [address], [place], [place]  [address] [phone]

## 2022-01-03 NOTE — Assessment & Plan Note (Addendum)
Patient currently takes amlodipine, carvedilol, and Lasix for hypertension.    - Restart home amlodipine and carvedilol - Holding home Lasix given AKI  Patient started to have recurrent hypotension and antihypertensive stopped and IV fluids restarted

## 2022-01-03 NOTE — Assessment & Plan Note (Addendum)
Patient has a history of CKD stage IV with most recent GFR ranging between 24 and 17.  On admission, creatinine elevated at 4.85 compared to prior 3.5.  This is likely secondary to obstructive uropathy in the setting of suprapubic catheter dysfunction.  Urology has replaced suprapubic catheter with cystogram confirming tip within bladder.   Renal function continued to worsen, likely due to ATN in the setting of small bowel obstruction and recurrent hypotension.  Nephrology following, patient not felt to be candidate for dialysis.  Now relatively stable with GFR around 12, continues to have good urine output.  Lab Results  Component Value Date   CREATININE 4.85 (H) 01/20/2022   CREATININE 5.07 (H) 01/19/2022   CREATININE 5.03 (H) 01/18/2022

## 2022-01-03 NOTE — Assessment & Plan Note (Addendum)
Small bowel obstruction.  Status post reduction of ventral hernia complicated by midline wound dehiscence status post wound closure  Initial CT renal study with evidence of some small bowel dilation with no focal point demonstrated.  CT cystogram demonstrates persistent small bowel dilation with evidence of focal transition and in the central mesentery.  Findings are consistent with small bowel obstruction.  General surgery following. With no improvement, patient taken to the OR for ex lap and reduction of hernia on 10/3.   Obstruction remaining persistent and patient to be started on TPN.  PICC line placed. Abdominal x-ray showing persistent small and large bowel dilation consistent with ileus -Reported multiple bowel movements the last couple days -Surgery team has removed NG tube and started full liquid as he tolerated clear liquid diet yesterday.  Nursing reported 1 episode of vomiting today.  His abdomen is getting somewhat more distended

## 2022-01-03 NOTE — Consult Note (Signed)
SURGICAL CONSULTATION NOTE   HISTORY OF PRESENT ILLNESS (HPI):  76 y.o. male presented to [facility] ED for evaluation of bleeding through suprapubic catheter.  Patient poor historian.  Wife at bedside which she is also not a great historian.  She endorses that patient has chronic Foley use and he had suprapubic catheter placement last week.  Patient was brought to the ED due to persistent leakage through the suprapubic catheter wound with bleeding.  There was also leakage from penis.  They deny abdominal pain.  They deny nausea or vomiting.  The wife endorses that she gave the patient a laxative on Wednesday and he had a good bowel movement yesterday.  They cannot specify this patient is passing flatus.  At the ED he was found with hemoglobin of 7.7.  White blood cell count of 10.0.  CMP shows hyponatremia 128, hypochloremia and elevated creatinine up to 4.85.  He had a CT scan of the pelvis that shows that the suprapubic catheter was position outside of the bladder.  This also showed incidental diffuse dilated small bowel and large intestine.  No free air.  I personally evaluated the images.  The catheter was repositioned by urology.  New cystogram order for confirmation of the position of the catheter.  Again no free air was seen on CT cystogram.  Surgery is consulted by Dr. Diamantina Providence in this context for evaluation and management of ileus.  PAST MEDICAL HISTORY (PMH):  Past Medical History:  Diagnosis Date   Anemia    CKD (chronic kidney disease), stage III ([place])    Gout    Hypertension    Urinary retention    in-dwelling Foley since Nov 2019, sees Dr. Hollice Espy     PAST SURGICAL HISTORY [facility]):  Past Surgical History:  Procedure Laterality Date   IR CATHETER TUBE CHANGE  12/30/2021   IR RADIOLOGIST EVAL & MGMT  01/01/2022     MEDICATIONS:  Prior to Admission medications   Medication Sig Start Date End Date Taking? Authorizing Provider  acetaminophen (TYLENOL) 325 MG tablet Take 2  tablets (650 mg total) by mouth every 6 (six) hours as needed for mild pain (or Fever >/= 101). 04/30/18  Yes Vaughan Basta, MD  allopurinol (ZYLOPRIM) 300 MG tablet Take 300 mg by mouth daily. 12/08/17  Yes [provider]  amLODipine (NORVASC) 10 MG tablet Take 10 mg by mouth daily.   Yes [provider]  calcium citrate-vitamin D (CITRACAL+D) 315-200 MG-UNIT tablet Take by mouth.   Yes [provider]  carvedilol (COREG) 25 MG tablet Take 25 mg by mouth 2 (two) times daily with a meal.  05/11/18  Yes [provider]  cholecalciferol (VITAMIN D3) 25 MCG (1000 UT) tablet Take 1,000 Units by mouth daily.   Yes [provider]  CRANBERRY-CALCIUM PO Take 1 capsule by mouth daily.   Yes [provider]  FEROSUL 325 (65 Fe) MG tablet Take 325 mg by mouth daily. 12/21/21  Yes [provider]  furosemide (LASIX) 40 MG tablet Take 40 mg by mouth daily. 07/16/20  Yes [provider]  lovastatin (MEVACOR) 40 MG tablet Take 1 tablet by mouth every evening. 07/16/17  Yes [provider]  polyethylene glycol (MIRALAX) 17 g packet Take 17 g by mouth daily. 08/30/18  Yes Lavonia Drafts, MD  aspirin EC 81 MG tablet Take 81 mg by mouth daily. Patient not taking: Reported on 01/03/2022    [provider]  Colchicine 0.6 MG CAPS Take by  mouth.    [provider]     ALLERGIES:  Allergies  Allergen Reactions   Ace Inhibitors     Other reaction(s): Other (See Comments) lip swelling     SOCIAL HISTORY:  Social History   Socioeconomic History   Marital status: Married    Spouse name: Not on file   Number of children: Not on file   Years of education: Not on file   Highest education level: Not on file  Occupational History   Not on file  Tobacco Use   Smoking status: Former    Types: Cigarettes    Quit date: 04/07/1998    Years since quitting: 23.7    Passive exposure: Past   Smokeless tobacco: Never   Vaping Use   Vaping Use: Never used  Substance and Sexual Activity   Alcohol use: Yes   Drug use: Never   Sexual activity: Not Currently    Birth control/protection: None  Other Topics Concern   Not on file  Social History Narrative   Lives at home with family   Social Determinants of Health   Financial Resource Strain: Low Risk  (04/28/2018)   Overall Financial Resource Strain (CARDIA)    Difficulty of Paying Living Expenses: Not hard at all  Food Insecurity: No Food Insecurity (04/28/2018)   Hunger Vital Sign    Worried About Running Out of Food in the Last Year: Never true    [place] in the Last Year: Never true  Transportation Needs: No Transportation Needs (04/28/2018)   PRAPARE - Hydrologist (Medical): No    Lack of Transportation (Non-Medical): No  Physical Activity: Inactive (04/28/2018)   Exercise Vital Sign    Days of Exercise per Week: 0 days    Minutes of Exercise per Session: 0 min  Stress: No Stress Concern Present (04/28/2018)   [place]    Feeling of Stress : Not at all  Social Connections: Moderately Isolated (04/28/2018)   Social Connection and Isolation Panel [NHANES]    Frequency of Communication with Friends and Family: Once a week    Frequency of Social Gatherings with Friends and Family: Once a week    Attends Religious Services: Never    Marine scientist or Organizations: No    Attends Archivist Meetings: Never    Marital Status: Married  Human resources officer Violence: Not At Risk (04/28/2018)   Humiliation, Afraid, Rape, and Kick questionnaire    Fear of Current or Ex-Partner: No    Emotionally Abused: No    Physically Abused: No    Sexually Abused: No      FAMILY HISTORY:  Family History  Problem Relation Age of Onset   Cancer Father      REVIEW OF SYSTEMS:  Constitutional: denies weight loss, fever, chills, or sweats   Eyes: denies any other vision changes, history of eye injury  ENT: denies sore throat, hearing problems  Respiratory: denies shortness of breath, wheezing  Cardiovascular: denies chest pain, palpitations  Gastrointestinal: abdominal pain, nausea and vomiting Genitourinary: Positive for chronic Foley, ambulating suprapubic catheter Musculoskeletal: denies any other joint pains or cramps  Skin: denies any other rashes or skin discolorations  Neurological: denies any other headache, dizziness, weakness  Psychiatric: denies any other depression, anxiety   All other review of systems were negative   VITAL SIGNS:  Temp:  [98.5 F (36.9 C)] 98.5 F (36.9 C) (  09/29 1249) Pulse Rate:  [67-73] 67 (09/29 1737) Resp:  [16-18] 18 (09/29 1737) BP: (94-136)/(55-60) 136/60 (09/29 1737) SpO2:  [94 %-96 %] 94 % (09/29 1737) Weight:  [73 kg] 73 kg (09/29 1250)     Height: '5\' 9"'$  (175.3 cm) Weight: 73 kg BMI (Calculated): 23.76   INTAKE/OUTPUT:  This shift: Total I/O In: 500 [IV Piggyback:500] Out: -   Last 2 shifts: '@IOLAST2SHIFTS'$ @   PHYSICAL EXAM:  Constitutional:  -- Normal body habitus  -- Awake, alert, and oriented x3  Eyes:  -- Pupils equally round and reactive to light  -- No scleral icterus  Ear, nose, and throat:  -- No jugular venous distension  Pulmonary:  -- No crackles  -- Equal breath sounds bilaterally -- Breathing non-labored at rest Cardiovascular:  -- S1, S2 present  -- No pericardial rubs Gastrointestinal:  -- Abdomen soft, nontender, distended, no guarding or rebound tenderness -- Suprapubic catheter in place.  No bleeding or drainage through wound at this moment. Musculoskeletal and Integumentary:  -- Wounds: None appreciated -- Extremities: bilateral pitting edema  Neurologic:  -- Motor function: intact and symmetric -- Sensation: intact and symmetric   Labs:     Latest Ref Rng & Units 01/03/2022   12:52 PM 12/05/2021    9:40 AM 09/28/2021    7:39 AM  CBC   WBC 4.0 - 10.5 K/uL 10.0  5.8  5.2   Hemoglobin 13.0 - 17.0 g/dL 7.7  9.0  9.7   Hematocrit 39.0 - 52.0 % 23.2  28.7  30.5   Platelets 150 - 400 K/uL 272  199  154       Latest Ref Rng & Units 01/03/2022   12:52 PM 09/28/2021    7:39 AM 09/13/2019    2:47 AM  CMP  Glucose 70 - 99 mg/dL 119  87  105   BUN 8 - 23 mg/dL 95  56  30   Creatinine 0.61 - 1.24 mg/dL 4.85  3.52  2.46   Sodium 135 - 145 mmol/L 128  135  140   Potassium 3.5 - 5.1 mmol/L 3.6  4.0  3.8   Chloride 98 - 111 mmol/L 96  108  108   CO2 22 - 32 mmol/L '16  22  23   '$ Calcium 8.9 - 10.3 mg/dL 10.2  10.6  10.4   Total Protein 6.5 - 8.1 g/dL 6.7  6.7    Total Bilirubin 0.3 - 1.2 mg/dL 0.7  0.3    Alkaline Phos 38 - 126 U/L 46  39    AST 15 - 41 U/L 13  12    ALT 0 - 44 U/L 12  9      Imaging studies:  EXAM: CT ABDOMEN AND PELVIS WITHOUT CONTRAST   TECHNIQUE: Multidetector CT imaging of the abdomen and pelvis was performed following the standard protocol without IV contrast.   RADIATION DOSE REDUCTION: This exam was performed according to the departmental dose-optimization program which includes automated exposure control, adjustment of the mA and/or kV according to patient size and/or use of iterative reconstruction technique.   COMPARISON:  CT renal stone dated May 11, 2019   FINDINGS: Lower chest: Trace left pleural effusion. Bibasilar atelectasis. Cardiomegaly. Coronary artery calcifications. Calcified mediastinal lymph nodes.   Hepatobiliary: Focal liver abnormality. Gallstones with no evidence of gallbladder wall thickening. No biliary ductal dilation.   Pancreas: Unremarkable. No pancreatic ductal dilatation or surrounding inflammatory changes.   Spleen: Normal in size without focal abnormality.  Adrenals/Urinary Tract: Bilateral adrenal glands are unremarkable. 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 Hyperdense material seen in the upper pole moiety. Bilateral simple  appearing renal cysts, no further follow-up imaging is recommended for these lesions. Nonobstructing bilateral stones. Suprapubic catheter with balloon positioned outside of the bladder posterior pelvis. Tip of the Foley catheter is adjacent to a small bowel loop.   Stomach/Bowel: Gastric distention multiple dilated loops of small bowel no definite focal transition point. Appendix appears normal. Diverticulosis. Mild wall thickening of the rectosigmoid colon.   Vascular/Lymphatic: Severe aortic atherosclerosis. Mildly enlarged left periaortic/perirenal lymph nodes, reference node measuring 1.1 cm in short axis on series 2, image 36, likely reactive.   Reproductive: Prostatomegaly.   Other: Small fluid containing right inguinal hernia. Trace abdominopelvic ascites.   Musculoskeletal: Multilevel degenerative disc disease. No aggressive appearing osseous lesions.   IMPRESSION: 1. Suprapubic catheter with balloon positioned outside of the bladder in the posterior pelvis. Tip of the Foley catheter is adjacent to a small bowel loop, can not exclude small bowel perforation. CT cystogram or fluoroscopy could assist with further evaluation. 2. Hyperdense material seen within the left renal collecting system superior pole moiety, likely due to hemorrhage or excreted contrast material from prior exam is an additional consideration, neoplasm is less likely. Recommend follow-up renal protocol CT in 3 months to ensure resolution. 3. Multiple dilated loops of small bowel with no definite focal transition point, findings may be due to ileus versus early/partial small bowel obstruction. 4. Mild wall thickening of the rectosigmoid colon, findings can be seen in the setting of proctocolitis. 5. Trace abdominopelvic ascites. 6. Mildly enlarged left periaortic lymph nodes, likely reactive. Recommend attention on follow-up as recommended above. 7. Severe aortic Atherosclerosis (ICD10-I70.0).    Critical Value/emergent results were called by telephone at the time of interpretation on 01/03/2022 at 2:36 pm to provider [facility] , who verbally acknowledged these results.     Electronically Signed   By: Yetta Glassman M.D.   On: 01/03/2022 14:42  Assessment/Plan:  76 y.o. male with ileus, complicated by pertinent comorbidities including acute over chronic kidney disease, hypertension, hyponatremia, anemia.  Patient consulted for evaluation of possible ileus versus bowel injury due to malposition of suprapubic urinary catheter.  Initial CT scan at the ED shows dilation of the small and large intestine.  There is gas in the rectum.  There is no free air.  Suprapubic catheter was repositioned by urology.  New CT cystogram does not shows free air.  Patient did have distended stomach, small intestine and large intestine.  No transition point.  No incarcerated hernias identified on physical exam.  Clinical picture favors more ileus due to acute over chronic kidney disease, electrolyte disturbances, chronical medical comorbidities and malposition of urinary catheter.  At this moment patient is not nauseated and as per wife had a bowel movement yesterday.  NGT can be hold for now but if patient get nauseated or develop any vomiting he will need an NGT to be placed.  Recommend to hydrate patient well, replace electrolytes.  I will follow-up final CT cystogram report.  I will follow patient with clinical physical exam.  If he develops any clinical deterioration patient will need CT scan with oral contrast.  Management of suprapubic catheter by urology.   Arnold Long, MD

## 2022-01-03 NOTE — H&P (Signed)
History and Physical    Patient: Troy Berry. GDJ:[id number] DOB: 12/13/1945 DOA: 01/03/2022 DOS: the patient was seen and examined on 01/03/2022 PCP: [facility]  Patient coming from: Home  Chief Complaint:  Chief Complaint  Patient presents with   Urinary Catheter Problems   HPI: Troy Goren. is a 76 y.o. male with medical history significant of atonic bladder s/p suprapubic catheter, CKD Stage 4, anemia of chronic kidney disease, hypertension, gout who presents to the ED with c/o hematuria and abdominal distention. History obtained from patient's wife, Troy Berry present at bedside.   Troy Berry states patient had a suprapubic catheter placed approximately 1 month ago and on 12/05/2021.  Since that time he has had persistent low-volume hematuria in his Foley bag.  On 12/30/2021 he underwent a catheter exchange with IR.  After that time, he was experiencing poor drainage from catheter in addition to significantly increased bloody output in his Foley bag and around suprapubic catheter insertion site at the skin.  They had a follow-up visit with IR and urology on 9/27.  At the urology office, there was noted to be tension on the drainage tubing and it was resecured.    Since that time, Troy Berry states there has been even more bloody output in Foley bag with limited urine output.  Troy Berry has also noticed increased abdominal distention with abdominal pain.  She denies noticing any fevers, chills, nausea, vomiting.  He did have 1 bowel movement yesterday that was normal in appearance.  They has noted increased generalized weakness that has significantly worsened over the past week.  Troy Berry endorses occasional dizziness upon standing.  On arrival to the ED, CT renal stone study was obtained that demonstrated a pubic catheter tip present adjacent to the small bowel loop, not located within the bladder.  In addition, there was a hyperdense material seen  in the left renal collecting system superior pole likely hemorrhage.  Multiple dilated bowel loops noted with no transition point, mild wall thickening of the rectosigmoid colon and trace ascites.  Urology was consulted and suprapubic catheter was repositioned.  TRH consulted for admission.  Review of Systems: As mentioned in the history of present illness. All other systems reviewed and are negative.  Past Medical History:  Diagnosis Date   Anemia    CKD (chronic kidney disease), stage III ([place])    Gout    Hypertension    Urinary retention    in-dwelling Foley since Nov 2019, sees Dr. Hollice Espy   Past Surgical History:  Procedure Laterality Date   IR CATHETER TUBE CHANGE  12/30/2021   IR RADIOLOGIST EVAL & MGMT  01/01/2022   Social History:  reports that he quit smoking about 23 years ago. His smoking use included cigarettes. He has been exposed to tobacco smoke. He has never used smokeless tobacco. He reports current alcohol use. He reports that he does not use drugs.  Allergies  Allergen Reactions   Ace Inhibitors     Other reaction(s): Other (See Comments) lip swelling    Family History  Problem Relation Age of Onset   Cancer Father     Prior to Admission medications   Medication Sig Start Date End Date Taking? Authorizing Provider  acetaminophen (TYLENOL) 325 MG tablet Take 2 tablets (650 mg total) by mouth every 6 (six) hours as needed for mild pain (or Fever >/= 101). 04/30/18  Yes Vaughan Basta, MD  allopurinol (ZYLOPRIM) 300 MG tablet Take  300 mg by mouth daily. 12/08/17  Yes [provider]  amLODipine (NORVASC) 10 MG tablet Take 10 mg by mouth daily.   Yes [provider]  calcium citrate-vitamin D (CITRACAL+D) 315-200 MG-UNIT tablet Take by mouth.   Yes [provider]  carvedilol (COREG) 25 MG tablet Take 25 mg by mouth 2 (two) times daily with a meal.  05/11/18  Yes [provider]  cholecalciferol (VITAMIN D3) 25 MCG  (1000 UT) tablet Take 1,000 Units by mouth daily.   Yes [provider]  CRANBERRY-CALCIUM PO Take 1 capsule by mouth daily.   Yes [provider]  FEROSUL 325 (65 Fe) MG tablet Take 325 mg by mouth daily. 12/21/21  Yes [provider]  furosemide (LASIX) 40 MG tablet Take 40 mg by mouth daily. 07/16/20  Yes [provider]  lovastatin (MEVACOR) 40 MG tablet Take 1 tablet by mouth every evening. 07/16/17  Yes [provider]  polyethylene glycol (MIRALAX) 17 g packet Take 17 g by mouth daily. 08/30/18  Yes Lavonia Drafts, MD  aspirin EC 81 MG tablet Take 81 mg by mouth daily. Patient not taking: Reported on 01/03/2022    [provider]  Colchicine 0.6 MG CAPS Take by mouth.    [provider]   Physical Exam: Vitals:   01/03/22 1250 01/03/22 1737 01/03/22 1805 01/03/22 1929  BP: (!) 94/55 136/60 (!) 140/63 (!) 117/55  Pulse:  67 64 73  Resp:  '18 16 18  '$ Temp:   99.2 F (37.3 C) 98.1 F (36.7 C)  TempSrc:      SpO2:  94% 100% 99%  Weight: 73 kg     Height: '5\' 9"'$  (1.753 m)      Physical Exam Vitals and nursing note reviewed.  Constitutional:      General: He is not in acute distress.    Appearance: He is obese. He is not ill-appearing.  HENT:     Head: Normocephalic and atraumatic.  Cardiovascular:     Rate and Rhythm: Normal rate and regular rhythm.     Heart sounds: No murmur heard. Pulmonary:     Effort: Pulmonary effort is normal. No respiratory distress.     Breath sounds: Normal breath sounds. No wheezing, rhonchi or rales.  Abdominal:     General: Bowel sounds are absent. There is distension.     Palpations: Abdomen is soft.     Tenderness: There is abdominal tenderness in the right lower quadrant, periumbilical area and left lower quadrant. There is no guarding.     Hernia: No hernia is present.  Genitourinary:    Penis: Normal.   Musculoskeletal:     Right lower leg: No edema.  Skin:    General: Skin is  warm and dry.  Neurological:     General: No focal deficit present.     Mental Status: He is alert. He is disoriented.     Comments: Disoriented to place and time.   Psychiatric:        Behavior: Behavior normal.    Data Reviewed: CBC remarkable for hemoglobin of 7.7.  CMP remarkable for sodium 128, CO2 of 16 with anion gap of 16, BUN of 95 with creatinine of 4.8, and glucose of 119.  CT renal study with suprapubic catheter with balloon position outside of the bladder adjacent to the small bowel, cannot exclude small bowel perforation.  In addition there was a hyperdense material seen in the left renal collecting system superior pole  likely hemorrhage or excreted contrast material from prior study.  In addition, multiple dilated loops of small bowel with no definitive focal transition point, likely ileus with mild wall thickening of the rectosigmoid colon and trace abdominal pelvic ascites and reactive lymphadenopathy.  CT cystogram with interval repositioning of the suprapubic catheter with balloon now within bladder.  In addition there is findings consistent with vesicular peritoneal fistula but no findings suggestive of bowel perforation.  Multiple loops of dilated small bowel with suspected transition point within the central mesentery consistent with small bowel obstruction versus ileus.  In addition there is marked prostatomegaly and gallstones.   There are no new results to review at this time.  Assessment and Plan: * Acute on chronic renal failure (HCC) Patient has a history of CKD stage IV with most recent GFR ranging between 24 and 17.  On admission, creatinine elevated at 4.85 compared to prior 3.5.  This is likely secondary to obstructive uropathy in the setting of suprapubic catheter dysfunction.  Urology has replaced suprapubic catheter with cystogram confirming tip within bladder.  I suspect renal function will gradually improve at this time.  No indication for hemodialysis.  If  no improvement in creatinine over next 24 hours, recommend nephrology consultation.  Regardless, patient will need an outpatient referral to establish with nephrology.  - Repeat CMP in the a.m. - Strict in and out  Suprapubic catheter dysfunction [facility]) Patient has a history of atonic bladder requiring suprapubic catheter placement approximately 1 month ago on 12/05/2021.  On 12/30/2021, catheter was replaced by IR.  Since that time, patient has been experiencing difficulties including hematuria and poor urinary drainage.  He was reevaluated by urology and IR on 9/27 without any improvement.  On admission today, it was discovered that suprapubic catheter tip was not in the bladder but adjacent to the small bowel.  Urology consulted and suprapubic catheter replaced with cystogram demonstrating appropriate placement within bladder.  - Urology following; appreciate their recommendations  SBO (small bowel obstruction) ([place]) Initial CT renal study with evidence of some small bowel dilation with no focal point demonstrated.  CT cystogram demonstrates persistent small bowel dilation with evidence of focal transition and in the central mesentery.  Findings are consistent with small bowel obstruction.  General surgery has been consulted; known occasions at this time for surgical intervention.  We will hold off on NG tube given lack of nausea or vomiting.  - General surgery following; appreciate their recommendations - Hold off on NG tube unless patient develops nausea or vomiting - Hold off on potassium repletion given acute on chronic kidney disease - Check magnesium level in the a.m.  Anemia Patient has a history of anemia of chronic kidney disease on oral iron supplementation.  Hemoglobin on admission below baseline at 7.7.  Likely in the setting of large volume hematuria over the past 5 days.  No indication at this time for transfusion, however discussed with patient and family.  They consented to blood  transfusion if indicated.  - Repeat CBC in the a.m. - Transfuse for hemoglobin less than 7 - Continue home oral iron supplementation  Hematuria Secondary to suprapubic catheter dysfunction.  Hyponatremia Sodium decreased at 128 on admission. Suspect this is secondary to acute on chronic renal failure.  Given gross hematuria, unable to obtain urine studies for further evaluation.  We will recheck sodium levels tomorrow morning.  No indication for patient has received 500 cc of normal saline thus far.  -Repeat sodium level in the  a.m.  Metabolic acidosis, increased anion gap Secondary to acute on chronic renal failure with elevated BUN.  No indication for bicarb at this time.  Essential hypertension Patient currently takes amlodipine, carvedilol, and Lasix for hypertension.    - Restart home amlodipine and carvedilol - Holding home Lasix given AKI   Advance Care Planning:   Code Status: Full Code   Consults: Urology, General Surgery  Family Communication: Wife and nephew updated at bedside. Patient's daughter updated via telephone  Severity of Illness: The appropriate patient status for this patient is OBSERVATION. Observation status is judged to be reasonable and necessary in order to provide the required intensity of service to ensure the patient's safety. The patient's presenting symptoms, physical exam findings, and initial radiographic and laboratory data in the context of their medical condition is felt to place them at decreased risk for further clinical deterioration. Furthermore, it is anticipated that the patient will be medically stable for discharge from the hospital within 2 midnights of admission.   Author: Jose Persia, MD 01/03/2022 10:02 PM  For on call review [email or web address].

## 2022-01-03 NOTE — Assessment & Plan Note (Signed)
Secondary to acute on chronic renal failure with elevated BUN.  No indication for bicarb at this time.

## 2022-01-03 NOTE — ED Provider Notes (Signed)
[facility] Provider Note    Event Date/Time   First MD Initiated Contact with Patient 01/03/22 1332     (approximate)  History   Chief Complaint: Urinary Catheter Problems  HPI  Troy Berry. is a 76 y.o. male with a past medical history of CKD, hypertension, urinary retention now with a suprapubic catheter who presents to the emergency department for continued bloody output from the suprapubic catheter as well as possible leaking.  Family states the patient was complaining of some abdominal pain earlier as well.  Here the patient appears well, denies any abdominal pain, nontender abdomen on my exam.  Patient does have a suprapubic catheter in place no leaking around the suprapubic catheter noted however does have what appears to be urine in his briefs possibly from the penis.  Patient's catheter is draining what appears to be hematuria.  Family states the patient appears like he is getting weaker.  Physical Exam   Triage Vital Signs: ED Triage Vitals  Enc Vitals Group     BP 01/03/22 1250 (!) 94/55     Pulse Rate 01/03/22 1249 73     Resp 01/03/22 1249 16     Temp 01/03/22 1249 98.5 F (36.9 C)     Temp Source 01/03/22 1249 Oral     SpO2 01/03/22 1249 96 %     Weight 01/03/22 1250 160 lb 15 oz (73 kg)     Height 01/03/22 1250 '5\' 9"'$  (1.753 m)     Head Circumference --      Peak Flow --      Pain Score 01/03/22 1249 7     Pain Loc --      Pain Edu? --      Excl. in [place]? --     Most recent vital signs: Vitals:   01/03/22 1249 01/03/22 1250  BP:  (!) 94/55  Pulse: 73   Resp: 16   Temp: 98.5 F (36.9 C)   SpO2: 96%     General: Awake, no distress.  CV:  Good peripheral perfusion.  Regular rate and rhythm  Resp:  Normal effort.  Equal breath sounds bilaterally.  Abd:  No distention.  Soft, nontender.  No rebound or guarding. Other:  Suprapubic catheter present with hematuria draining, mild amount.  Briefs are wet.   ED Results /  Procedures / Treatments   RADIOLOGY  I have reviewed the CT images.  On my interpretation the catheter does not appear to be in the bladder. Radiology is called me regarding the CT scan confirming that the suprapubic catheter appears to be positioned outside of the bladder in the posterior pelvis adjacent to small bowel cannot exclude perforation.  Hyperdense material in the left renal collecting system possibly indicating hemorrhage.  Dilated loops of small bowel likely indicating ileus.  Wall thickening of the rectosigmoid colon.   MEDICATIONS ORDERED IN ED: Medications  sodium chloride 0.9 % bolus 500 mL (500 mLs Intravenous New Bag/Given 01/03/22 1416)     IMPRESSION / MDM / ASSESSMENT AND PLAN / ED COURSE  I reviewed the triage vital signs and the nursing notes.  Patient's presentation is most consistent with acute presentation with potential threat to life or bodily function.  Patient presents emergency department for continued hematuria now with weakness.  Patient's suprapubic catheter in place but minimal drainage into the collection bag what is draining appears to be quite bloody.  Family states that has been bleeding for the past month  or so. 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  Patient was just seen 01/01/2022 by urology for urinary incontinence referred from IR.  Patient's lab work today does show chemistry with a sodium of 128, sodium is usually in the lower 130s.  Creatinine is also increased to 4.5 from 3.53 months ago.  Patient CBC shows a hemoglobin of 7.7 down from 9.0 4 weeks ago, possibly due to continued hematuria for the past 1 month per wife.  We will send a type and screen.  We will discuss with urology.  We will obtain CT imaging and a bladder scan.  Patient CT scan shows a suprapubic catheter that appears to be placed outside of the  urine bladder.  This is likely the cause of the bloody output from the catheter.  Radiology also concern for possible small bowel perforation although they state no clear evidence of perforation on CT imaging.  We will dose IV Zosyn as a precaution reassuringly patient is afebrile with a normal white blood cell count.  Patient continues to appear well and denies any pain.  I spoke with Dr. Diamantina Providence of urology who will be down shortly to see the patient and may attempt bedside catheter retraction.  After urology sees the patient we will come up with a plan, anticipate the patient will need admission to the hospital for monitoring at a minimum.  Urology has seen the patient and adjusted the catheter.  Patient admitted to the hospitalist for further work-up.  FINAL CLINICAL IMPRESSION(S) / ED DIAGNOSES   Malpositioned suprapubic catheter Ileus Weakness Symptomatic anemia   Note:  This document was prepared using Dragon voice recognition software and may include unintentional dictation errors.   Harvest Dark, MD 01/06/22 [phone]

## 2022-01-04 ENCOUNTER — Observation Stay: Payer: Medicare HMO

## 2022-01-04 DIAGNOSIS — R319 Hematuria, unspecified: Secondary | ICD-10-CM | POA: Diagnosis not present

## 2022-01-04 DIAGNOSIS — D62 Acute posthemorrhagic anemia: Secondary | ICD-10-CM | POA: Diagnosis present

## 2022-01-04 DIAGNOSIS — K560 Paralytic ileus: Secondary | ICD-10-CM | POA: Diagnosis not present

## 2022-01-04 DIAGNOSIS — F0390 Unspecified dementia without behavioral disturbance: Secondary | ICD-10-CM

## 2022-01-04 DIAGNOSIS — E876 Hypokalemia: Secondary | ICD-10-CM | POA: Diagnosis not present

## 2022-01-04 DIAGNOSIS — K56601 Complete intestinal obstruction, unspecified as to cause: Secondary | ICD-10-CM | POA: Diagnosis present

## 2022-01-04 DIAGNOSIS — E872 Acidosis, unspecified: Secondary | ICD-10-CM | POA: Diagnosis present

## 2022-01-04 DIAGNOSIS — Z23 Encounter for immunization: Secondary | ICD-10-CM | POA: Diagnosis present

## 2022-01-04 DIAGNOSIS — E871 Hypo-osmolality and hyponatremia: Secondary | ICD-10-CM | POA: Diagnosis present

## 2022-01-04 DIAGNOSIS — Z992 Dependence on renal dialysis: Secondary | ICD-10-CM | POA: Diagnosis not present

## 2022-01-04 DIAGNOSIS — Y738 Miscellaneous gastroenterology and urology devices associated with adverse incidents, not elsewhere classified: Secondary | ICD-10-CM | POA: Diagnosis present

## 2022-01-04 DIAGNOSIS — T83090A Other mechanical complication of cystostomy catheter, initial encounter: Secondary | ICD-10-CM | POA: Diagnosis present

## 2022-01-04 DIAGNOSIS — D631 Anemia in chronic kidney disease: Secondary | ICD-10-CM | POA: Diagnosis present

## 2022-01-04 DIAGNOSIS — E43 Unspecified severe protein-calorie malnutrition: Secondary | ICD-10-CM | POA: Diagnosis present

## 2022-01-04 DIAGNOSIS — T83030A Leakage of cystostomy catheter, initial encounter: Secondary | ICD-10-CM | POA: Diagnosis present

## 2022-01-04 DIAGNOSIS — N3001 Acute cystitis with hematuria: Secondary | ICD-10-CM | POA: Diagnosis not present

## 2022-01-04 DIAGNOSIS — R310 Gross hematuria: Secondary | ICD-10-CM | POA: Diagnosis present

## 2022-01-04 DIAGNOSIS — K56609 Unspecified intestinal obstruction, unspecified as to partial versus complete obstruction: Secondary | ICD-10-CM | POA: Diagnosis not present

## 2022-01-04 DIAGNOSIS — N179 Acute kidney failure, unspecified: Secondary | ICD-10-CM | POA: Diagnosis not present

## 2022-01-04 DIAGNOSIS — N138 Other obstructive and reflux uropathy: Secondary | ICD-10-CM | POA: Diagnosis present

## 2022-01-04 DIAGNOSIS — K9189 Other postprocedural complications and disorders of digestive system: Secondary | ICD-10-CM | POA: Diagnosis present

## 2022-01-04 DIAGNOSIS — T83010S Breakdown (mechanical) of cystostomy catheter, sequela: Secondary | ICD-10-CM

## 2022-01-04 DIAGNOSIS — Z515 Encounter for palliative care: Secondary | ICD-10-CM | POA: Diagnosis not present

## 2022-01-04 DIAGNOSIS — N184 Chronic kidney disease, stage 4 (severe): Secondary | ICD-10-CM | POA: Diagnosis present

## 2022-01-04 DIAGNOSIS — T8130XA Disruption of wound, unspecified, initial encounter: Secondary | ICD-10-CM | POA: Diagnosis present

## 2022-01-04 DIAGNOSIS — N390 Urinary tract infection, site not specified: Secondary | ICD-10-CM | POA: Diagnosis not present

## 2022-01-04 DIAGNOSIS — K567 Ileus, unspecified: Secondary | ICD-10-CM | POA: Diagnosis present

## 2022-01-04 DIAGNOSIS — E870 Hyperosmolality and hypernatremia: Secondary | ICD-10-CM | POA: Diagnosis not present

## 2022-01-04 DIAGNOSIS — K439 Ventral hernia without obstruction or gangrene: Secondary | ICD-10-CM | POA: Diagnosis present

## 2022-01-04 DIAGNOSIS — I129 Hypertensive chronic kidney disease with stage 1 through stage 4 chronic kidney disease, or unspecified chronic kidney disease: Secondary | ICD-10-CM | POA: Diagnosis present

## 2022-01-04 DIAGNOSIS — Z681 Body mass index (BMI) 19 or less, adult: Secondary | ICD-10-CM | POA: Diagnosis not present

## 2022-01-04 DIAGNOSIS — F03911 Unspecified dementia, unspecified severity, with agitation: Secondary | ICD-10-CM | POA: Diagnosis not present

## 2022-01-04 DIAGNOSIS — N170 Acute kidney failure with tubular necrosis: Secondary | ICD-10-CM | POA: Diagnosis present

## 2022-01-04 LAB — RENAL FUNCTION PANEL
Albumin: 2.6 g/dL — ABNORMAL LOW (ref 3.5–5.0)
Anion gap: 10 (ref 5–15)
BUN: 99 mg/dL — ABNORMAL HIGH (ref 8–23)
CO2: 16 mmol/L — ABNORMAL LOW (ref 22–32)
Calcium: 9.7 mg/dL (ref 8.9–10.3)
Chloride: 104 mmol/L (ref 98–111)
Creatinine, Ser: 4.97 mg/dL — ABNORMAL HIGH (ref 0.61–1.24)
GFR, Estimated: 11 mL/min — ABNORMAL LOW (ref >60)
Glucose, Bld: 102 mg/dL — ABNORMAL HIGH (ref 70–99)
Phosphorus: 4.7 mg/dL — ABNORMAL HIGH (ref 2.5–4.6)
Potassium: 3.4 mmol/L — ABNORMAL LOW (ref 3.5–5.1)
Sodium: 130 mmol/L — ABNORMAL LOW (ref 135–145)

## 2022-01-04 LAB — PREPARE RBC (CROSSMATCH)

## 2022-01-04 LAB — CBC
HCT: 21.4 % — ABNORMAL LOW (ref 39.0–52.0)
Hemoglobin: 7 g/dL — ABNORMAL LOW (ref 13.0–17.0)
MCH: 25.9 pg — ABNORMAL LOW (ref 26.0–34.0)
MCHC: 32.7 g/dL (ref 30.0–36.0)
MCV: 79.3 fL — ABNORMAL LOW (ref 80.0–100.0)
Platelets: 240 10*3/uL (ref 150–400)
RBC: 2.7 MIL/uL — ABNORMAL LOW (ref 4.22–5.81)
RDW: 15.3 % (ref 11.5–15.5)
WBC: 7.9 10*3/uL (ref 4.0–10.5)
nRBC: 0 % (ref 0.0–0.2)

## 2022-01-04 LAB — IRON AND TIBC
Iron: 11 ug/dL — ABNORMAL LOW (ref 45–182)
Saturation Ratios: 7 % — ABNORMAL LOW (ref 17.9–39.5)
TIBC: 161 ug/dL — ABNORMAL LOW (ref 250–450)
UIBC: 150 ug/dL

## 2022-01-04 LAB — VITAMIN B12: Vitamin B-12: 684 pg/mL (ref 180–914)

## 2022-01-04 LAB — MAGNESIUM: Magnesium: 2.2 mg/dL (ref 1.7–2.4)

## 2022-01-04 LAB — ABO/RH: ABO/RH(D): A POS

## 2022-01-04 LAB — GLUCOSE, CAPILLARY: Glucose-Capillary: 89 mg/dL (ref 70–99)

## 2022-01-04 LAB — FERRITIN: Ferritin: 159 ng/mL (ref 24–336)

## 2022-01-04 MED ORDER — SODIUM CHLORIDE 0.9% IV SOLUTION: Freq: Once | INTRAVENOUS | Status: AC

## 2022-01-04 MED ORDER — LACTULOSE 10 GM/15ML PO SOLN
20.0000 g | Freq: Once | ORAL | Status: AC
  Administered 2022-01-04: 20 g via ORAL
  Filled 2022-01-04: qty 30

## 2022-01-04 MED ORDER — POTASSIUM CHLORIDE 10 MEQ/100ML IV SOLN
10.0000 meq | INTRAVENOUS | Status: AC
  Administered 2022-01-04 (×2): 10 meq via INTRAVENOUS
  Filled 2022-01-04 (×2): qty 100

## 2022-01-04 MED ORDER — POLYETHYLENE GLYCOL 3350 17 G PO PACK
17.0000 g | PACK | Freq: Every day | ORAL | Status: DC
  Administered 2022-01-04 – 2022-01-12 (×5): 17 g via ORAL
  Filled 2022-01-04 (×6): qty 1

## 2022-01-04 MED ORDER — SODIUM CHLORIDE 0.9 % IV SOLN
1.0000 g | INTRAVENOUS | Status: AC
  Administered 2022-01-04 – 2022-01-12 (×9): 1 g via INTRAVENOUS
  Filled 2022-01-04: qty 1
  Filled 2022-01-04 (×5): qty 10
  Filled 2022-01-04: qty 1
  Filled 2022-01-04 (×4): qty 10

## 2022-01-04 MED ORDER — SODIUM CHLORIDE 0.9 % IV SOLN
300.0000 mg | Freq: Once | INTRAVENOUS | Status: AC
  Administered 2022-01-04: 300 mg via INTRAVENOUS
  Filled 2022-01-04: qty 300

## 2022-01-04 MED ORDER — SODIUM CHLORIDE 0.45 % IV SOLN
INTRAVENOUS | Status: DC
  Filled 2022-01-04 (×4): qty 75

## 2022-01-04 NOTE — Progress Notes (Signed)
Niece called to floor agitated about patient care. Yelled at nurse over the phone for 3 minutes. Did not let the nurse say a word or clearly express the cause of her frustrations. Niece does not know what room patient is in. Wife and daughter at bedside.Nurse spoke to them about the niece. They stated that they did not want her to know where he was. His hospital stay was supposed to be a secret but another family member accidentally told the niece. That she was unstable. Said that security would need to be called if she came to the floor. Asked nurse to lock patient's chart and to set up patient password.

## 2022-01-04 NOTE — Progress Notes (Signed)
Patient ID: Troy Beals., male   DOB: [birth year], 76 y.o.   MRN: [id number]     [facility] Hospital Day(s): 0.   Interval History: Patient seen and examined, no acute events or new complaints overnight.  Patient is a good historian and cannot say if he has any new complaint.  He was unable to say if he has tolerated diet.  He was unable to specify about bowel movements.  I discussed with patient's nurse and there has been no limitation of bowel movement.  Unable to know if he is passing gas.  Abdomen still pretty distended.  Patient does not complain upon palpation of the abdomen.  He has not complaining of nausea either.  Vital signs in last 24 hours: [min-max] current  Temp:  [97.8 F (36.6 C)-99.2 F (37.3 C)] 97.8 F (36.6 C) (09/30 0846) Pulse Rate:  [64-73] 70 (09/30 0846) Resp:  [16-20] 20 (09/30 0846) BP: (94-140)/(55-71) 114/55 (09/30 0846) SpO2:  [94 %-100 %] 100 % (09/30 0846) Weight:  [72.8 kg-73 kg] 72.8 kg (09/30 0408)     Height: '5\' 9"'$  (175.3 cm) Weight: 72.8 kg BMI (Calculated): 23.69   Physical Exam:  Constitutional: alert, cooperative and no distress  Respiratory: breathing non-labored at rest  Cardiovascular: regular rate and sinus rhythm  Gastrointestinal: soft, non-tender, but distended  Labs:     Latest Ref Rng & Units 01/04/2022    7:31 AM 01/03/2022   12:52 PM 12/05/2021    9:40 AM  CBC  WBC 4.0 - 10.5 K/uL 7.9  10.0  5.8   Hemoglobin 13.0 - 17.0 g/dL 7.0  7.7  9.0   Hematocrit 39.0 - 52.0 % 21.4  23.2  28.7   Platelets 150 - 400 K/uL 240  272  199       Latest Ref Rng & Units 01/04/2022    7:31 AM 01/03/2022   12:52 PM 09/28/2021    7:39 AM  CMP  Glucose 70 - 99 mg/dL 102  119  87   BUN 8 - 23 mg/dL 99  95  56   Creatinine 0.61 - 1.24 mg/dL 4.97  4.85  3.52   Sodium 135 - 145 mmol/L 130  128  135   Potassium 3.5 - 5.1 mmol/L 3.4  3.6  4.0   Chloride 98 - 111 mmol/L 104  96  108   CO2 22 - 32 mmol/L '16  16  22   '$ Calcium 8.9 - 10.3  mg/dL 9.7  10.2  10.6   Total Protein 6.5 - 8.1 g/dL  6.7  6.7   Total Bilirubin 0.3 - 1.2 mg/dL  0.7  0.3   Alkaline Phos 38 - 126 U/L  46  39   AST 15 - 41 U/L  13  12   ALT 0 - 44 U/L  12  9     Imaging studies: No new pertinent imaging studies   Assessment/Plan:  76 y.o. male with ileus, complicated by pertinent comorbidities including acute over chronic kidney disease, hypertension, hyponatremia, anemia.  -Patient a good historian so unable to clinically know if patient is passing gas.  There has been no documentation of bowel movement. -No acute abdomen.  The abdomen is nontender to palpation.  No fever or tachycardia.  No concern of bowel injury -Possible etiologies of ileus versus obstruction is the acute over chronic renal failure, electrolyte disturbances, the fact that patient had the urinary catheter get them in a cavity. -Continue  management of acute renal failure as per hospitalist.  Replace electrolytes. -I will order new abdominal x-ray and if there is any worsening abdominal distention will consider putting NGT.  Also this patient started feeling nauseated will need an NGT. -I will continue to follow closely.  Arnold Long, MD

## 2022-01-04 NOTE — Progress Notes (Signed)
Progress Note   Patient: Troy Berry. ZOX:[id number] DOB: Apr 18, 1945 DOA: 01/03/2022     0 DOS: the patient was seen and examined on 01/04/2022   Brief hospital course: Jatavion Peaster. is a 76 y.o. male with medical history significant of atonic bladder s/p suprapubic catheter, CKD Stage 4, anemia of chronic kidney disease, hypertension, gout who presents to the ED with c/o hematuria and abdominal distention. Patient had a suprapubic catheter dysfunction, with significant urinary retention.  Suprapubic catheter was replaced by urology after arriving in the emergency room.  Patient also has significant abdominal distention, suspect small bowel obstruction, but was seen by general surgery, believe this is due to ileus.  Lab test showed sodium 128, CO2 16, creatinine 4.85.  Hemoglobin 7.7, dropped down to 7.0.  CT scan abdomen pelvis showed dilated small bowel.  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  Urine study and urine culture sent out, patient is given IV fluids with bicarb.   Assessment and Plan: Acute renal failure on chronic kidney disease stage IV. Severe metabolic acidosis Hypokalemia. Hyponatremia. Suprapubic catheter dysfunction. Suprapubic catheter was replaced by urology today.  Patient still have significant gross hematuria. Patient developed acute renal failure and metabolic acidosis due to obstruction. Fluids started with bicarb drip.  Also give IV potassium.  Gross hematuria Acute blood loss anemia. Iron deficient anemia. Possible cystitis with gross hematuria. Patient still has significant gross hematuria, followed by urology.  Possibility of urinary tract infection causing gross hematuria.  Urine culture and UA will be sent today, I discussed with nurse. For now, patient will be covered with cefepime for possible UTI until culture results available. Patient hemoglobin had dropped down to 7.0.  We will give IV iron, also give PRBC  x1.  Small bowel obstruction versus ileus. Patient has a mild abdominal distention, decreased bowel sounds, most likely ileus.  Patient has been seen by general surgery, will keep n.p.o. for now. I will also give lactulose and MiraLAX.  Essential hypertension. Continue some home medicines.  Hold off diuretics.  Dementia without behavioral disturbance. Patient is quite confused, but no agitation.     Subjective:  Patient still confused, without agitation.  No hypoxia or shortness of breath.  Physical Exam: Vitals:   01/03/22 1805 01/03/22 1929 01/04/22 0408 01/04/22 0846  BP: (!) 140/63 (!) 117/55 139/71 (!) 114/55  Pulse: 64 73 65 70  Resp: '16 18 18 20  '$ Temp: 99.2 F (37.3 C) 98.1 F (36.7 C) 98.4 F (36.9 C) 97.8 F (36.6 C)  TempSrc:      SpO2: 100% 99% 100% 100%  Weight:   72.8 kg   Height:       General exam: Appears calm and comfortable  Respiratory system: Clear to auscultation. Respiratory effort normal. Cardiovascular system: S1 & S2 heard, RRR. No JVD, murmurs, rubs, gallops or clicks. No pedal edema. Gastrointestinal system: Abdomen is nondistended, soft and nontender. No organomegaly or masses felt. Normal bowel sounds heard. Central nervous system: Alert and oriented x1. No focal neurological deficits. Extremities: Symmetric 5 x 5 power. Skin: No rashes, lesions or ulcers Psychiatry: Mood & affect appropriate.   Data Reviewed:  CT scan results and lab results reviewed.  Family Communication: Wife updated at bedside.  Disposition: Status is: Observation The patient will require care spanning > 2 midnights and should be moved to inpatient because: Severity of disease, multiple IV treatment.  Planned Discharge Destination: Home with Home Health    Time spent:  55 minutes  Author: Sharen Hones, MD 01/04/2022 2:41 PM  For on call review [email or web address].

## 2022-01-04 NOTE — Hospital Course (Addendum)
76 year old male past medical history have atonic bladder status post suprapubic catheter plus stage IV chronic kidney disease and secondary hypertension who presented to the emergency room on 9/30 with complaints of suprapubic catheter dysfunction as well as abdominal distention.  Patient found to have possible SBO versus ileus.   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 General surgery and urology were consulted. Started on cefepime for possible UTI.   NG tube placed for abdominal distention.  Patient however did not improve with conservative management. 10/3, patient underwent ex lap and reduction of internal hernia. Patient was then noted to have acute onset of M-End and serous drainage through the wound, wound dehiscence and omental exposure.  Laparotomy wound 10/6, patient was taken to OR for wound closure.  He was noted to have distended bladder despite suprapubic catheter.  Bloody urine was aspirated.  There is no evidence of bowel obstruction 10/11, decision made to start patient on TPN with PICC line. Course also complicated by wound dehiscence and exposed omentum at laparotomy site Continues to have blood in Foley catheter, continues to need TPN followed by general surgery, nephrology. 10/17: Vomited once and abdomen getting more distended, surgery following

## 2022-01-05 ENCOUNTER — Inpatient Hospital Stay: Payer: Medicare HMO

## 2022-01-05 ENCOUNTER — Encounter: Payer: Self-pay | Admitting: Internal Medicine

## 2022-01-05 DIAGNOSIS — R310 Gross hematuria: Secondary | ICD-10-CM

## 2022-01-05 DIAGNOSIS — N184 Chronic kidney disease, stage 4 (severe): Secondary | ICD-10-CM

## 2022-01-05 DIAGNOSIS — D62 Acute posthemorrhagic anemia: Secondary | ICD-10-CM

## 2022-01-05 DIAGNOSIS — K56609 Unspecified intestinal obstruction, unspecified as to partial versus complete obstruction: Secondary | ICD-10-CM | POA: Diagnosis not present

## 2022-01-05 DIAGNOSIS — N179 Acute kidney failure, unspecified: Secondary | ICD-10-CM

## 2022-01-05 LAB — TYPE AND SCREEN
ABO/RH(D): A POS
Antibody Screen: NEGATIVE
Unit division: 0

## 2022-01-05 LAB — MAGNESIUM: Magnesium: 2.3 mg/dL (ref 1.7–2.4)

## 2022-01-05 LAB — CBC
HCT: 26.9 % — ABNORMAL LOW (ref 39.0–52.0)
Hemoglobin: 9.1 g/dL — ABNORMAL LOW (ref 13.0–17.0)
MCH: 26.7 pg (ref 26.0–34.0)
MCHC: 33.8 g/dL (ref 30.0–36.0)
MCV: 78.9 fL — ABNORMAL LOW (ref 80.0–100.0)
Platelets: 274 10*3/uL (ref 150–400)
RBC: 3.41 MIL/uL — ABNORMAL LOW (ref 4.22–5.81)
RDW: 15 % (ref 11.5–15.5)
WBC: 10.1 10*3/uL (ref 4.0–10.5)
nRBC: 0 % (ref 0.0–0.2)

## 2022-01-05 LAB — BASIC METABOLIC PANEL
Anion gap: 11 (ref 5–15)
BUN: 97 mg/dL — ABNORMAL HIGH (ref 8–23)
CO2: 17 mmol/L — ABNORMAL LOW (ref 22–32)
Calcium: 10.1 mg/dL (ref 8.9–10.3)
Chloride: 106 mmol/L (ref 98–111)
Creatinine, Ser: 4.96 mg/dL — ABNORMAL HIGH (ref 0.61–1.24)
GFR, Estimated: 11 mL/min — ABNORMAL LOW (ref >60)
Glucose, Bld: 76 mg/dL (ref 70–99)
Potassium: 3.5 mmol/L (ref 3.5–5.1)
Sodium: 134 mmol/L — ABNORMAL LOW (ref 135–145)

## 2022-01-05 LAB — URINALYSIS, COMPLETE (UACMP) WITH MICROSCOPIC
Bacteria, UA: NONE SEEN
RBC / HPF: >50 RBC/hpf — ABNORMAL HIGH (ref 0–5)
Specific Gravity, Urine: 1.02 (ref 1.005–1.030)
Squamous Epithelial / HPF: NONE SEEN (ref 0–5)
WBC, UA: >50 WBC/hpf — ABNORMAL HIGH (ref 0–5)

## 2022-01-05 LAB — BPAM RBC
Blood Product Expiration Date: 202310262359
ISSUE DATE / TIME: 202309301745
Unit Type and Rh: 6200

## 2022-01-05 MED ORDER — POTASSIUM CHLORIDE 10 MEQ/100ML IV SOLN
10.0000 meq | Freq: Once | INTRAVENOUS | Status: AC
  Administered 2022-01-05: 10 meq via INTRAVENOUS
  Filled 2022-01-05: qty 100

## 2022-01-05 MED ORDER — METOPROLOL TARTRATE 5 MG/5ML IV SOLN
5.0000 mg | Freq: Three times a day (TID) | INTRAVENOUS | Status: DC
  Administered 2022-01-05 – 2022-01-10 (×14): 5 mg via INTRAVENOUS
  Filled 2022-01-05 (×12): qty 5

## 2022-01-05 MED ORDER — POTASSIUM CHLORIDE CRYS ER 20 MEQ PO TBCR: 40.0000 meq | EXTENDED_RELEASE_TABLET | Freq: Once | ORAL | Status: DC

## 2022-01-05 MED ORDER — SODIUM BICARBONATE 8.4 % IV SOLN
INTRAVENOUS | Status: DC
  Filled 2022-01-05 (×13): qty 1000

## 2022-01-05 MED ORDER — HALOPERIDOL LACTATE 5 MG/ML IJ SOLN
2.0000 mg | Freq: Four times a day (QID) | INTRAMUSCULAR | Status: DC | PRN
  Administered 2022-01-10 – 2022-01-22 (×5): 2 mg via INTRAVENOUS
  Filled 2022-01-05 (×6): qty 1

## 2022-01-05 MED ORDER — SODIUM CHLORIDE 0.9 % IV SOLN
300.0000 mg | Freq: Once | INTRAVENOUS | Status: AC
  Administered 2022-01-05: 300 mg via INTRAVENOUS
  Filled 2022-01-05: qty 15

## 2022-01-05 NOTE — Progress Notes (Signed)
Urology Inpatient Progress Report  Acute on chronic renal failure (HCC) [N17.9, N18.9] Gross hematuria [R31.0]    Intv/Subj: No acute events overnight. Does not like his NGT No issues with SPT overnight.  Principal Problem:   Acute renal failure superimposed on stage 4 chronic kidney disease (HCC) Active Problems:   Anemia   Essential hypertension   Gross hematuria   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HCC)   Hyponatremia   Metabolic acidosis   Hematuria   SBO (small bowel obstruction) ([place])   Dementia without behavioral disturbance ([place])   Hypokalemia  Current Facility-Administered Medications  Medication Dose Route Frequency Provider Last Rate Last Admin   0.9 %  sodium chloride infusion  250 mL Intravenous PRN Jose Persia, MD       acetaminophen (TYLENOL) tablet 650 mg  650 mg Oral Q6H PRN Jose Persia, MD       Or   acetaminophen (TYLENOL) suppository 650 mg  650 mg Rectal Q6H PRN Jose Persia, MD       allopurinol (ZYLOPRIM) tablet 300 mg  300 mg Oral Daily Jose Persia, MD   300 mg at 01/04/22 0813   amLODipine (NORVASC) tablet 10 mg  10 mg Oral Daily Jose Persia, MD   10 mg at 01/04/22 5366   carvedilol (COREG) tablet 25 mg  25 mg Oral BID WC Jose Persia, MD   25 mg at 01/04/22 1758   ceFEPIme (MAXIPIME) 1 g in sodium chloride 0.9 % 100 mL IVPB  1 g Intravenous Q24H Sharen Hones, MD   Stopped at 01/05/22 0018   iron sucrose (VENOFER) 300 mg in sodium chloride 0.9 % 250 mL IVPB  300 mg Intravenous Once Sharen Hones, MD       polyethylene glycol (MIRALAX / GLYCOLAX) packet 17 g  17 g Oral Daily Sharen Hones, MD   17 g at 01/04/22 1620   potassium chloride SA (KLOR-CON M) CR tablet 40 mEq  40 mEq Oral Once Sharen Hones, MD       pravastatin (PRAVACHOL) tablet 40 mg  40 mg Oral q1800 Jose Persia, MD   40 mg at 01/04/22 1758   sodium bicarbonate 75 mEq in sodium chloride 0.45 % 1,075 mL infusion   Intravenous Continuous Sharen Hones, MD 100 mL/hr at  01/05/22 0517 New Bag at 01/05/22 0517   sodium chloride flush (NS) 0.9 % injection 3 mL  3 mL Intravenous Q12H Jose Persia, MD   3 mL at 01/04/22 2120   sodium chloride flush (NS) 0.9 % injection 3 mL  3 mL Intravenous PRN Jose Persia, MD         Objective: Vital: Vitals:   01/04/22 1809 01/04/22 1912 01/05/22 0421 01/05/22 0851  BP: 128/74 126/62 137/71 (!) 121/58  Pulse: 73 73 81 74  Resp: '18 18 18 18  '$ Temp: 98.2 F (36.8 C) 99.5 F (37.5 C) 98.1 F (36.7 C) 98.8 F (37.1 C)  TempSrc:      SpO2: 100% 96% 98% 96%  Weight:   72.4 kg   Height:       I/Os: I/O last 3 completed shifts: In: 1402.1 [I.V.:1050.1; Blood:252; IV Piggyback:100] Out: 1500 [Urine:1500]  Physical Exam:  General: Patient is in no apparent distress Lungs: Normal respiratory effort, chest expands symmetrically. GI: Belly tympanetic, SPT draining brown urine Ext: lower extremities symmetric  Lab Results: Recent Labs    01/03/22 1252 01/04/22 0731 01/05/22 0521  WBC 10.0 7.9 10.1  HGB 7.7* 7.0* 9.1*  HCT 23.2* 21.4* 26.9*  Recent Labs    01/03/22 1252 01/04/22 0731 01/05/22 0521  NA 128* 130* 134*  K 3.6 3.4* 3.5  CL 96* 104 106  CO2 16* 16* 17*  GLUCOSE 119* 102* 76  BUN 95* 99* 97*  CREATININE 4.85* 4.97* 4.96*  CALCIUM 10.2 9.7 10.1   No results for input(s): "LABPT", "INR" in the last 72 hours. No results for input(s): "LABURIN" in the last 72 hours. Results for orders placed or performed during the hospital encounter of 07/27/21  Urine Culture     Status: Abnormal   Collection Time: 07/27/21  8:48 AM   Specimen: Urine, Catheterized  Result Value Ref Range Status   Specimen Description   Final    URINE, CATHETERIZED Performed at [facility], [address], [place], [place] [address]    Special Requests   Final    NONE Performed at [facility], [address] [place], [place] [address]    Culture MULTIPLE SPECIES PRESENT, SUGGEST  RECOLLECTION (A)  Final   Report Status 07/28/2021 FINAL  Final    Studies/Results: DG Abd 2 Views  Result Date: 01/05/2022 CLINICAL DATA:  Follow-up ileus. EXAM: ABDOMEN - 2 VIEW COMPARISON:  01/04/2022 and CT, 01/03/2022. FINDINGS: Moderate gaseous distention of the stomach. Mild dilation of gas-filled small bowel loops, most evident in the left mid abdomen, with no definite air-fluid levels on the erect view. No free air. Stable suprapubic catheter. IMPRESSION: 1. Moderate distention of the stomach and mild dilation of small bowel, small-bowel dilation decreased when compared to the previous day's exam suggesting an improving adynamic ileus. Electronically Signed   By: Lajean Manes M.D.   On: 01/05/2022 09:26   DG Abd 2 Views  Result Date: 01/04/2022 CLINICAL DATA:  Abdominal distention EXAM: ABDOMEN - 2 VIEW COMPARISON:  CT abdomen pelvis, 01/03/2022 FINDINGS: Diffusely gas-filled, distended small bowel and colon throughout the abdomen and pelvis, with gas present to the rectum. Largest loops of small bowel measure up to 4.5 cm in caliber. Scattered stool throughout the colon and rectum. No obvious free air on supine radiographs. Suprapubic catheter. IMPRESSION: Diffusely gas-filled, distended small bowel and colon throughout the abdomen and pelvis, with gas present to the rectum. Findings are most consistent with ileus. Electronically Signed   By: Delanna Ahmadi M.D.   On: 01/04/2022 13:16   CT CYSTOGRAM ABD/PELVIS  Result Date: 01/03/2022 CLINICAL DATA:  Pelvic pain malpositioned Foley EXAM: CT CYSTOGRAM (CT ABDOMEN AND PELVIS WITH CONTRAST) TECHNIQUE: Multi-detector CT imaging through the abdomen and pelvis was performed after dilute contrast had been introduced into the bladder for the purposes of performing CT cystography. RADIATION DOSE REDUCTION: This exam was performed according to the departmental dose-optimization program which includes automated exposure control, adjustment of the mA  and/or kV according to patient size and/or use of iterative reconstruction technique. CONTRAST:  250 mL Cystografin through suprapubic catheter COMPARISON:  CT KUB 01/03/2022, fluoroscopic images 12/30/2021, CT 05/11/2019 FINDINGS: Lower chest: Lung bases demonstrate small right-sided pleural effusion. Cardiomegaly with extensive coronary vascular calcification. Mild scarring or atelectasis at the bases. Hepatobiliary: Calcified gallstones. No focal hepatic abnormality. No biliary dilatation Pancreas: Unremarkable. No pancreatic ductal dilatation or surrounding inflammatory changes. Spleen: Normal in size without focal abnormality. Adrenals/Urinary Tract: Adrenal glands are within normal limits. Kidneys show no hydronephrosis. Left renal cysts, no follow-up imaging is recommended. Duplex left renal collecting system as before. Nonobstructing bilateral kidney stones. Hyperdense material in the left renal collecting system. Interim repositioning of suprapubic bladder  catheter with balloon now visualized in the bladder. Focal contained contrast collection at the left posterior aspect of the bladder. Delayed images following catheter drainage demonstrate draining of most of the contrast with focal collection in the left posterior pelvis and small fistulous tract, series 7, image 32. No free contrast around pelvic bowel loops. No intraluminal contrast within adjacent small bowel. Stomach/Bowel: The stomach is moderately distended with fluid. There are multiple fluid-filled loops of small bowel proximally. Slight swirling appearance of the mesentery in vessels centrally with tapered appearance of the bowel, series 2, image 49, coronal series 5 image 35, suggestive of transition point. No acute bowel wall thickening. No intramural air. Negative appendix. Vascular/Lymphatic: Advanced aortic atherosclerosis. No aneurysm. Mildly prominent left Peri aortic lymph node, series 2, image 33. Reproductive: Enlarged prostate. Other:  No free air. Trace free fluid. Fluid and fat containing right inguinal hernia Musculoskeletal: No acute osseous abnormality. Multilevel degenerative changes. IMPRESSION: 1. Interval repositioning of suprapubic catheter with balloon now positioned within the bladder. Focal contained contrast collection within the left pelvis, posterior to the bladder, appears to conform to previous location of the catheter. 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 There is no intraluminal contrast within adjacent small bowel to suggest fistula or bowel perforation. There is no free extravasation of contrast into the upper abdomen. 2. Multiple loops of dilated fluid-filled small bowel with suspected transition point within the central mesentery, suspect for bowel obstruction, potentially due to hernia or adhesive disease. 3. Small amount of hyperdense material in the upper pole collecting system of left kidney, see prior CT KUB report for additional recommendations 4. Marked prostatomegaly 5. Gallstones Electronically Signed   By: Donavan Foil M.D.   On: 01/03/2022 17:55   CT Renal Stone Study  Result Date: 01/03/2022 CLINICAL DATA:  Flank pain EXAM: CT ABDOMEN AND PELVIS WITHOUT CONTRAST TECHNIQUE: Multidetector CT imaging of the abdomen and pelvis was performed following the standard protocol without IV contrast. RADIATION DOSE REDUCTION: This exam was performed according to the departmental dose-optimization program which includes automated exposure control, adjustment of the mA and/or kV according to patient size and/or use of iterative reconstruction technique. COMPARISON:  CT renal stone dated May 11, 2019 FINDINGS: Lower chest: Trace left pleural effusion. Bibasilar atelectasis. Cardiomegaly. Coronary artery calcifications. Calcified  mediastinal lymph nodes. Hepatobiliary: Focal liver abnormality. Gallstones with no evidence of gallbladder wall thickening. No biliary ductal dilation. Pancreas: Unremarkable. No pancreatic ductal dilatation or surrounding inflammatory changes. Spleen: Normal in size without focal abnormality. Adrenals/Urinary Tract: Bilateral adrenal glands are unremarkable. 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 Hyperdense material seen in the upper pole moiety. Bilateral simple appearing renal cysts, no further follow-up imaging is recommended for these lesions. Nonobstructing bilateral stones. Suprapubic catheter with balloon positioned outside of the bladder posterior pelvis. Tip of the Foley catheter is adjacent to a small bowel loop. Stomach/Bowel: Gastric distention multiple dilated loops of small bowel no definite focal transition point. Appendix appears normal. Diverticulosis. Mild wall thickening of the rectosigmoid colon. Vascular/Lymphatic: Severe aortic atherosclerosis. Mildly enlarged left periaortic/perirenal lymph nodes, reference node measuring 1.1 cm in short axis on series 2, image 36, likely reactive. Reproductive: Prostatomegaly. Other: Small fluid containing right inguinal hernia. Trace abdominopelvic ascites. Musculoskeletal: Multilevel degenerative disc disease. No aggressive appearing osseous lesions. IMPRESSION: 1. Suprapubic catheter with balloon positioned outside of  the bladder in the posterior pelvis. Tip of the Foley catheter is adjacent to a small bowel loop, can not exclude small bowel perforation. CT cystogram or fluoroscopy could assist with further evaluation. 2. Hyperdense material seen within the left renal collecting system superior pole moiety, likely due to hemorrhage or excreted contrast material from prior exam is an additional consideration, neoplasm is less likely. Recommend follow-up renal protocol CT in 3 months to ensure resolution. 3. Multiple dilated loops of small  bowel with no definite focal transition point, findings may be due to ileus versus early/partial small bowel obstruction. 4. Mild wall thickening of the rectosigmoid colon, findings can be seen in the setting of proctocolitis. 5. Trace abdominopelvic ascites. 6. Mildly enlarged left periaortic lymph nodes, likely reactive. Recommend attention on follow-up as recommended above. 7. Severe aortic Atherosclerosis (ICD10-I70.0). Critical Value/emergent results were called by telephone at the time of interpretation on 01/03/2022 at 2:36 pm to provider [facility] , who verbally acknowledged these results. Electronically Signed   By: Yetta Glassman M.D.   On: 01/03/2022 14:42    Assessment: SPT repositioned and draining old blood without issue  Plan: Continue SPT to gravity. Will follow from [place], page for questions.   Louis Meckel, MD Urology 01/05/2022, 9:49 AM

## 2022-01-05 NOTE — Progress Notes (Signed)
Patient ID: Troy Beals., male   DOB: 1946-01-04, 76 y.o.   MRN: [id number]     [place] Hospital Day(s): 1.   Interval History: Patient seen and examined, no acute events or new complaints overnight. Patient denies any new complaint.  Denies significant abdominal pain.  Denies any nausea or vomiting.  Still very distended.  No passing gas or having bowel movement.  Vital signs in last 24 hours: [min-max] current  Temp:  [98.1 F (36.7 C)-99.5 F (37.5 C)] 98.1 F (36.7 C) (10/01 0421) Pulse Rate:  [73-81] 81 (10/01 0421) Resp:  [18-20] 18 (10/01 0421) BP: (126-139)/(62-74) 137/71 (10/01 0421) SpO2:  [96 %-100 %] 98 % (10/01 0421) Weight:  [72.4 kg] 72.4 kg (10/01 0421)     Height: '5\' 9"'$  (175.3 cm) Weight: 72.4 kg BMI (Calculated): 23.56   Physical Exam:  Constitutional: alert, cooperative and no distress  Respiratory: breathing non-labored at rest  Cardiovascular: regular rate and sinus rhythm  Gastrointestinal: soft, non-tender, and distended  Labs:     Latest Ref Rng & Units 01/05/2022    5:21 AM 01/04/2022    7:31 AM 01/03/2022   12:52 PM  CBC  WBC 4.0 - 10.5 K/uL 10.1  7.9  10.0   Hemoglobin 13.0 - 17.0 g/dL 9.1  7.0  7.7   Hematocrit 39.0 - 52.0 % 26.9  21.4  23.2   Platelets 150 - 400 K/uL 274  240  272       Latest Ref Rng & Units 01/05/2022    5:21 AM 01/04/2022    7:31 AM 01/03/2022   12:52 PM  CMP  Glucose 70 - 99 mg/dL 76  102  119   BUN 8 - 23 mg/dL 97  99  95   Creatinine 0.61 - 1.24 mg/dL 4.96  4.97  4.85   Sodium 135 - 145 mmol/L 134  130  128   Potassium 3.5 - 5.1 mmol/L 3.5  3.4  3.6   Chloride 98 - 111 mmol/L 106  104  96   CO2 22 - 32 mmol/L '17  16  16   '$ Calcium 8.9 - 10.3 mg/dL 10.1  9.7  10.2   Total Protein 6.5 - 8.1 g/dL   6.7   Total Bilirubin 0.3 - 1.2 mg/dL   0.7   Alkaline Phos 38 - 126 U/L   46   AST 15 - 41 U/L   13   ALT 0 - 44 U/L   12     Imaging studies: Abdominal x-ray shows persistent dilation of the  intestine but mostly the stomach.  No free air.   Assessment/Plan:  76 y.o. male with ileus, complicated by pertinent comorbidities including acute over chronic kidney disease, hypertension, hyponatremia, anemia.  Ileus versus bowel obstruction -Patient with persistent abdominal distention.  X-ray shows dilation of small from stomach. -Patient has been adequately hydrated, improved hemoglobin with transfusions, better electrolytes and improved hyponatremia -Despite this patient continue with ileus. -I will put NGT to decompress the stomach and after decompression of the stoma we will consider doing a Gastrografin challenge -Continue IV hydration and medical management of ileus -I will continue with physical exam and serial abdominal x-rays.  I will continue to follow closely.  Arnold Long, MD

## 2022-01-05 NOTE — Progress Notes (Signed)
New orders received this morning to place NG tube in. Fisrt NG tube place and Xray verified placement. Nurse received orders to connect tube to suction and when nurse entered the room pt had already removed NG out. MD Cintron-Diaz made aware and advice to place NG tube back in.  2nd NG tube placed and verified by X ray. This time patient was put on mittens bilateral.  MD at bedside connected pt to succion. Tube working properly.  While rounding on pt. Nurse entered pt room and  notice pt had removed NG tube out again. Surgeon and MD made aware and received orders to place NG in , and safety sitter. Charge nurse notified. No sitters 1;1 available at the moment so telesitter arranged for now.

## 2022-01-05 NOTE — Progress Notes (Signed)
3rd NG tube place. ABD xray completed. Orders received to connect NG tube to suction

## 2022-01-05 NOTE — Progress Notes (Signed)
Sitter available from 3-11. Family in the room requested not having the person sitting in the room while they are in the room. Telesitter removed from the room. Charge nurse and [facility] aware. Sitter sitting outside the room.

## 2022-01-05 NOTE — Plan of Care (Signed)

## 2022-01-05 NOTE — Progress Notes (Signed)
Progress Note   Patient: Troy Berry. JQB:[id number] DOB: July 21, 1945 DOA: 01/03/2022     1 DOS: the patient was seen and examined on 01/05/2022   Brief hospital course: Troy Virgil. is a 76 y.o. male with medical history significant of atonic bladder s/p suprapubic catheter, CKD Stage 4, anemia of chronic kidney disease, hypertension, gout who presents to the ED with c/o hematuria and abdominal distention. Patient had a suprapubic catheter dysfunction, with significant urinary retention.  Suprapubic catheter was replaced by urology after arriving in the emergency room.  Patient also has significant abdominal distention, suspect small bowel obstruction, but was seen by general surgery, believe this is due to ileus.  Lab test showed sodium 128, CO2 16, creatinine 4.85.  Hemoglobin 7.7, dropped down to 7.0.  CT scan abdomen pelvis showed dilated small bowel.  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  Urine study and urine culture sent out, patient is given IV fluids with bicarb.  Patient was given blood transfusion and IV iron, also started on cefepime for possible UTI with hematuria.  10/1.  NG suction started for increased abdominal distention.   Assessment and Plan:  Acute renal failure on chronic kidney disease stage IV. Severe metabolic acidosis Hypokalemia. Hyponatremia. Suprapubic catheter dysfunction. Suprapubic catheter was replaced by urology.  Patient still have significant gross hematuria. Patient developed acute renal failure and metabolic acidosis due to obstruction. Renal function has not recovered, still has significant metabolic acidosis.  Changed fluids to D5 with 150 mEq of sodium bicarbonate. Potassium is 3.5, given 10 mEq IV potassium.   Gross hematuria Acute blood loss anemia. Iron deficient anemia. Possible cystitis with gross hematuria. Patient still has significant gross hematuria, followed by urology.  Possibility of urinary  tract infection causing gross hematuria.   Patient hemoglobin had dropped down to 7.0.  Received IV iron, and  PRBC x1 9/30. Hemoglobin is better, will give another dose of IV iron. Continue cefepime for now. Urine culture ordered at admission is sent out today, may not be positive as the patient already receiving antibiotics.    Small bowel obstruction versus ileus. Patient had increased abdominal distention today, followed by general surgery.  NG suction started today.   Essential hypertension. Beta-blocker changed to metoprolol 60 mg every 8 hours IV due to n.p.o. status.   Dementia with behavioral disturbance. Patient pulled out his NG tube placed earlier today.  We will give as needed Haldol for agitation.     Subjective:  Patient is a very confused which is baseline.  Some agitation, pulling on NG tube. No shortness of breath.  Physical Exam: Vitals:   01/04/22 1809 01/04/22 1912 01/05/22 0421 01/05/22 0851  BP: 128/74 126/62 137/71 (!) 121/58  Pulse: 73 73 81 74  Resp: '18 18 18 18  '$ Temp: 98.2 F (36.8 C) 99.5 F (37.5 C) 98.1 F (36.7 C) 98.8 F (37.1 C)  TempSrc:      SpO2: 100% 96% 98% 96%  Weight:   72.4 kg   Height:       General exam: Appears calm and comfortable  Respiratory system: Clear to auscultation. Respiratory effort normal. Cardiovascular system: S1 & S2 heard, RRR. No JVD, murmurs, rubs, gallops or clicks. No pedal edema. Gastrointestinal system: Abdomen is nondistended, soft and nontender. No organomegaly or masses felt. Normal bowel sounds heard. Central nervous system: Alert and confused.. No focal neurological deficits. Extremities: Symmetric 5 x 5 power. Skin: No rashes, lesions or ulcers  Data Reviewed:  Lab results reviewed.  Family Communication:   Disposition: Status is: Inpatient Remains inpatient appropriate because: Severity of disease, IV treatment.  Planned Discharge Destination:  TBD, likely home with [facility]    Time spent:  55 minutes  Author: Sharen Hones, MD 01/05/2022 12:05 PM  For on call review [email or web address].

## 2022-01-06 ENCOUNTER — Inpatient Hospital Stay: Payer: Medicare HMO

## 2022-01-06 DIAGNOSIS — N179 Acute kidney failure, unspecified: Secondary | ICD-10-CM | POA: Diagnosis not present

## 2022-01-06 DIAGNOSIS — F0390 Unspecified dementia without behavioral disturbance: Secondary | ICD-10-CM | POA: Diagnosis not present

## 2022-01-06 DIAGNOSIS — D62 Acute posthemorrhagic anemia: Secondary | ICD-10-CM | POA: Diagnosis not present

## 2022-01-06 DIAGNOSIS — E876 Hypokalemia: Secondary | ICD-10-CM | POA: Diagnosis not present

## 2022-01-06 LAB — CBC
HCT: 25.6 % — ABNORMAL LOW (ref 39.0–52.0)
Hemoglobin: 8.6 g/dL — ABNORMAL LOW (ref 13.0–17.0)
MCH: 26.5 pg (ref 26.0–34.0)
MCHC: 33.6 g/dL (ref 30.0–36.0)
MCV: 78.8 fL — ABNORMAL LOW (ref 80.0–100.0)
Platelets: 288 10*3/uL (ref 150–400)
RBC: 3.25 MIL/uL — ABNORMAL LOW (ref 4.22–5.81)
RDW: 15 % (ref 11.5–15.5)
WBC: 10.3 10*3/uL (ref 4.0–10.5)
nRBC: 0 % (ref 0.0–0.2)

## 2022-01-06 LAB — BASIC METABOLIC PANEL
Anion gap: 9 (ref 5–15)
BUN: 95 mg/dL — ABNORMAL HIGH (ref 8–23)
CO2: 21 mmol/L — ABNORMAL LOW (ref 22–32)
Calcium: 9.8 mg/dL (ref 8.9–10.3)
Chloride: 104 mmol/L (ref 98–111)
Creatinine, Ser: 4.97 mg/dL — ABNORMAL HIGH (ref 0.61–1.24)
GFR, Estimated: 11 mL/min — ABNORMAL LOW (ref >60)
Glucose, Bld: 103 mg/dL — ABNORMAL HIGH (ref 70–99)
Potassium: 2.8 mmol/L — ABNORMAL LOW (ref 3.5–5.1)
Sodium: 134 mmol/L — ABNORMAL LOW (ref 135–145)

## 2022-01-06 LAB — URINE CULTURE: Culture: NO GROWTH

## 2022-01-06 LAB — MAGNESIUM: Magnesium: 2.2 mg/dL (ref 1.7–2.4)

## 2022-01-06 MED ORDER — POTASSIUM CHLORIDE 10 MEQ/100ML IV SOLN
10.0000 meq | Freq: Once | INTRAVENOUS | Status: AC
  Administered 2022-01-06: 10 meq via INTRAVENOUS
  Filled 2022-01-06: qty 100

## 2022-01-06 MED ORDER — DIATRIZOATE MEGLUMINE & SODIUM 66-10 % PO SOLN
90.0000 mL | Freq: Once | ORAL | Status: AC
  Administered 2022-01-06: 90 mL via NASOGASTRIC

## 2022-01-06 NOTE — Assessment & Plan Note (Signed)
Hesitant to aggressively replace given acute on chronic advanced renal failure.  Potassium 2.8.

## 2022-01-06 NOTE — Assessment & Plan Note (Addendum)
Stable at baseline 

## 2022-01-06 NOTE — Progress Notes (Signed)
Patient ID: [name], male   DOB: 11-02-1945, 76 y.o.   MRN: [id number]     [place] Hospital Day(s): 2.   Interval History: Patient seen and examined, no acute events or new complaints overnight. Patient unable to give history.  Seen comfortable as usual in bed.  Not seem to be complaining about anything at this moment.  Discussed with nurse and no events overnight were reported.  There has been no reported bowel movement or gas.  Vital signs in last 24 hours: [min-max] current  Temp:  [97.8 F (36.6 C)-99.6 F (37.6 C)] 98.6 F (37 C) (10/02 1129) Pulse Rate:  [72-86] 86 (10/02 1129) Resp:  [18-20] 20 (10/02 1129) BP: (113-130)/(60-69) 121/60 (10/02 1129) SpO2:  [96 %-100 %] 97 % (10/02 1129)     Height: '5\' 9"'$  (175.3 cm) Weight: 72.4 kg BMI (Calculated): 23.56   Physical Exam:  Constitutional: alert, cooperative and no distress  Respiratory: breathing non-labored at rest  Cardiovascular: regular rate and sinus rhythm  Gastrointestinal: soft, non-tender, and distended  Labs:     Latest Ref Rng & Units 01/06/2022    5:23 AM 01/05/2022    5:21 AM 01/04/2022    7:31 AM  CBC  WBC 4.0 - 10.5 K/uL 10.3  10.1  7.9   Hemoglobin 13.0 - 17.0 g/dL 8.6  9.1  7.0   Hematocrit 39.0 - 52.0 % 25.6  26.9  21.4   Platelets 150 - 400 K/uL 288  274  240       Latest Ref Rng & Units 01/06/2022    5:23 AM 01/05/2022    5:21 AM 01/04/2022    7:31 AM  CMP  Glucose 70 - 99 mg/dL 103  76  102   BUN 8 - 23 mg/dL 95  97  99   Creatinine 0.61 - 1.24 mg/dL 4.97  4.96  4.97   Sodium 135 - 145 mmol/L 134  134  130   Potassium 3.5 - 5.1 mmol/L 2.8  3.5  3.4   Chloride 98 - 111 mmol/L 104  106  104   CO2 22 - 32 mmol/L '21  17  16   '$ Calcium 8.9 - 10.3 mg/dL 9.8  10.1  9.7     Imaging studies: No new pertinent imaging studies   Assessment/Plan:  76 y.o. male with ileus, complicated by pertinent comorbidities including acute over chronic kidney disease, hypertension,  hyponatremia, anemia.  -Patient continue without passing has.  Continue with abdominal distention. -He had high output from the NGT (1250 cc in last 24 hours). -We will order Gastrografin challenge -Today presented with hypokalemia.  Replacing potassium. -Continue IV hydration, continue monitoring electrolytes -We will follow closely.  Arnold Long, MD

## 2022-01-06 NOTE — Progress Notes (Signed)
[facility] Progress Note  Patient: Troy Berry.    MGQ:[id number]  DOA: 01/03/2022    Date of Service: the patient was seen and examined on 01/06/2022  Brief hospital course: 76 year old male past medical history have atonic bladder status post suprapubic catheter plus stage IV chronic kidney disease and secondary hypertension who presented to the emergency room on 9/30 with complaints of suprapubic catheter dysfunction as well as abdominal distention.  Patient found to have possible SBO versus ileus.  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  Started on cefepime for possible UTI.  NG tube placed for abdominal distention.  General surgery and urology consulted.    Assessment and Plan: Assessment and Plan: * Acute renal failure superimposed on stage 4 chronic kidney disease ([place]) Patient has a history of CKD stage IV with most recent GFR ranging between 24 and 17.  On admission, creatinine elevated at 4.85 compared to prior 3.5.  This is likely secondary to obstructive uropathy in the setting of suprapubic catheter dysfunction.  Urology has replaced suprapubic catheter with cystogram confirming tip within bladder.  I suspect renal function will gradually improve at this time.  No indication for hemodialysis.  Consult nephrology if no improvement.  Suprapubic catheter dysfunction [facility]) Patient has a history of atonic bladder requiring suprapubic catheter placement approximately 1 month ago on 12/05/2021.  On 12/30/2021, catheter was replaced by IR.  Since that time, patient has been experiencing difficulties including hematuria and poor urinary drainage.  He was reevaluated by urology and IR on 9/27 without any improvement.  On admission, it was discovered that suprapubic catheter tip was not in the bladder but adjacent to the small bowel.  Urology consulted and suprapubic catheter replaced with cystogram demonstrating appropriate  placement within bladder.  - Urology following; appreciate their recommendations  SBO (small bowel obstruction) ([place]) Initial CT renal study with evidence of some small bowel dilation with no focal point demonstrated.  CT cystogram demonstrates persistent small bowel dilation with evidence of focal transition and in the central mesentery.  Findings are consistent with small bowel obstruction.  General surgery has been consulted; known occasions at this time for surgical intervention.  NG tube placed.  Repeat abdominal x-ray from 10/2 pending  Anemia Patient has a history of anemia of chronic kidney disease on oral iron supplementation.  Hemoglobin on admission below baseline at 7.7.  Likely in the setting of large volume hematuria over the previous 5 days prior to admission.  Hemoglobin down to 7.0 on 9/30 and transfused 1 unit of packed red blood cells.  Continue iron supplementation  Hematuria Secondary to suprapubic catheter dysfunction.  Hyponatremia-resolved as of 01/06/2022 Sodium decreased at 128 on admission. Suspect this is secondary to acute on chronic renal failure.  Given gross hematuria, unable to obtain urine studies for further evaluation.  Following hydration, sodium up to 267  Metabolic acidosis Secondary to acute on chronic renal failure with elevated BUN.  No indication for bicarb at this time.  Essential hypertension Patient currently takes amlodipine, carvedilol, and Lasix for hypertension.    - Restart home amlodipine and carvedilol - Holding home Lasix given AKI  Hypokalemia Hesitant to aggressively replace given acute on chronic advanced renal failure.  Potassium 2.8.  Dementia without behavioral disturbance (HCC) Stable at baseline       Body mass index is 23.57 kg/m.        Consultants: Urology General surgery  Procedures: Transfusion 1  unit packed red blood cells Replacement of Foley catheter  Antimicrobials: IV cefepime 9/30-present  Code  Status: Full code   Subjective: Patient resting comfortably  Objective: Vital signs were reviewed and unremarkable. Vitals:   01/06/22 1129 01/06/22 1553  BP: 121/60 129/63  Pulse: 86 74  Resp: 20 16  Temp: 98.6 F (37 C) 98.6 F (37 C)  SpO2: 97%     Intake/Output Summary (Last 24 hours) at 01/06/2022 1805 Last data filed at 01/06/2022 0816 Gross per 24 hour  Intake 1516.28 ml  Output 2000 ml  Net -483.72 ml   Filed Weights   01/03/22 1250 01/04/22 0408 01/05/22 0421  Weight: 73 kg 72.8 kg 72.4 kg   Body mass index is 23.57 kg/m.  Exam:  General: Alert and oriented x2, no acute distress HEENT: Normocephalic, atraumatic, mucous membranes slightly dry Cardiovascular: Regular rate and rhythm, S1-S2 Respiratory: Clear to auscultation bilaterally Abdomen: Abdomen is soft, moderately distended, absent bowel sounds, minimal tenderness Musculoskeletal: No clubbing or cyanosis, trace pitting edema Skin: Skin breaks, tears or lesions Psychiatry: Underlying dementia, no acute psychoses Neurology: No focal deficits  Data Reviewed: Creatinine 4.97, normal magnesium, potassium 2.8  Disposition:  Status is: Inpatient Remains inpatient appropriate because:  -Improvement in bowel obstruction    Anticipated discharge date: 10/4  Family Communication: Wife at the bedside DVT Prophylaxis: SCDs Start: 01/03/22 1631    Author: Annita Brod ,MD 01/06/2022 6:05 PM  To reach On-call, see care teams to locate the attending and reach out via [email or web address]. Between 7PM-7AM, please contact night-coverage If you still have difficulty reaching the attending provider, please page the [facility] (Director on Call) for [facility] on amion for assistance.

## 2022-01-06 NOTE — Plan of Care (Signed)

## 2022-01-07 ENCOUNTER — Other Ambulatory Visit: Payer: Self-pay

## 2022-01-07 ENCOUNTER — Inpatient Hospital Stay: Payer: Medicare HMO | Admitting: Certified Registered Nurse Anesthetist

## 2022-01-07 ENCOUNTER — Inpatient Hospital Stay: Payer: Medicare HMO

## 2022-01-07 ENCOUNTER — Encounter
Admission: EM | Disposition: A | Discharge status: Skilled Nursing Facility | Payer: Self-pay | Source: Home / Self Care | Attending: Internal Medicine

## 2022-01-07 ENCOUNTER — Encounter: Payer: Self-pay | Admitting: Internal Medicine

## 2022-01-07 DIAGNOSIS — N179 Acute kidney failure, unspecified: Secondary | ICD-10-CM | POA: Diagnosis not present

## 2022-01-07 DIAGNOSIS — N3001 Acute cystitis with hematuria: Secondary | ICD-10-CM

## 2022-01-07 DIAGNOSIS — E876 Hypokalemia: Secondary | ICD-10-CM | POA: Diagnosis not present

## 2022-01-07 DIAGNOSIS — D62 Acute posthemorrhagic anemia: Secondary | ICD-10-CM | POA: Diagnosis not present

## 2022-01-07 DIAGNOSIS — F0390 Unspecified dementia without behavioral disturbance: Secondary | ICD-10-CM | POA: Diagnosis not present

## 2022-01-07 HISTORY — PX: LAPAROTOMY: SHX154

## 2022-01-07 LAB — BASIC METABOLIC PANEL
Anion gap: 13 (ref 5–15)
BUN: 94 mg/dL — ABNORMAL HIGH (ref 8–23)
CO2: 23 mmol/L (ref 22–32)
Calcium: 9.7 mg/dL (ref 8.9–10.3)
Chloride: 104 mmol/L (ref 98–111)
Creatinine, Ser: 5.08 mg/dL — ABNORMAL HIGH (ref 0.61–1.24)
GFR, Estimated: 11 mL/min — ABNORMAL LOW (ref >60)
Glucose, Bld: 111 mg/dL — ABNORMAL HIGH (ref 70–99)
Potassium: 3.1 mmol/L — ABNORMAL LOW (ref 3.5–5.1)
Sodium: 140 mmol/L (ref 135–145)

## 2022-01-07 LAB — MAGNESIUM: Magnesium: 2.4 mg/dL (ref 1.7–2.4)

## 2022-01-07 LAB — PROCALCITONIN: Procalcitonin: 8.66 ng/mL

## 2022-01-07 SURGERY — LAPAROTOMY, EXPLORATORY
Anesthesia: General

## 2022-01-07 MED ORDER — ONDANSETRON HCL 4 MG/2ML IJ SOLN: 4.0000 mg | Freq: Once | INTRAMUSCULAR | Status: DC | PRN

## 2022-01-07 MED ORDER — BUPIVACAINE-EPINEPHRINE (PF) 0.5% -1:200000 IJ SOLN
INTRAMUSCULAR | Status: AC
  Filled 2022-01-07: qty 30

## 2022-01-07 MED ORDER — FENTANYL CITRATE (PF) 100 MCG/2ML IJ SOLN: 25.0000 ug | INTRAMUSCULAR | Status: DC | PRN

## 2022-01-07 MED ORDER — SODIUM CHLORIDE FLUSH 0.9 % IV SOLN
INTRAVENOUS | Status: AC
  Filled 2022-01-07: qty 30

## 2022-01-07 MED ORDER — LIDOCAINE HCL (CARDIAC) PF 100 MG/5ML IV SOSY
PREFILLED_SYRINGE | INTRAVENOUS | Status: DC | PRN
  Administered 2022-01-07: 80 mg via INTRAVENOUS

## 2022-01-07 MED ORDER — ONDANSETRON HCL 4 MG/2ML IJ SOLN
INTRAMUSCULAR | Status: DC | PRN
  Administered 2022-01-07: 4 mg via INTRAVENOUS

## 2022-01-07 MED ORDER — ACETAMINOPHEN 10 MG/ML IV SOLN
INTRAVENOUS | Status: DC | PRN
  Administered 2022-01-07: 1000 mg via INTRAVENOUS

## 2022-01-07 MED ORDER — BUPIVACAINE LIPOSOME 1.3 % IJ SUSP
INTRAMUSCULAR | Status: AC
  Filled 2022-01-07: qty 20

## 2022-01-07 MED ORDER — DEXAMETHASONE SODIUM PHOSPHATE 10 MG/ML IJ SOLN
INTRAMUSCULAR | Status: DC | PRN
  Administered 2022-01-07: 10 mg via INTRAVENOUS

## 2022-01-07 MED ORDER — CEFAZOLIN SODIUM-DEXTROSE 2-4 GM/100ML-% IV SOLN
INTRAVENOUS | Status: AC
  Filled 2022-01-07: qty 100

## 2022-01-07 MED ORDER — PHENYLEPHRINE 80 MCG/ML (10ML) SYRINGE FOR IV PUSH (FOR BLOOD PRESSURE SUPPORT)
PREFILLED_SYRINGE | INTRAVENOUS | Status: DC | PRN
  Administered 2022-01-07 (×7): 160 ug via INTRAVENOUS

## 2022-01-07 MED ORDER — DEXAMETHASONE SODIUM PHOSPHATE 10 MG/ML IJ SOLN
INTRAMUSCULAR | Status: AC
  Filled 2022-01-07: qty 1

## 2022-01-07 MED ORDER — PROPOFOL 10 MG/ML IV BOLUS
INTRAVENOUS | Status: AC
  Filled 2022-01-07: qty 20

## 2022-01-07 MED ORDER — ONDANSETRON HCL 4 MG/2ML IJ SOLN
INTRAMUSCULAR | Status: AC
  Filled 2022-01-07: qty 2

## 2022-01-07 MED ORDER — PROPOFOL 10 MG/ML IV BOLUS
INTRAVENOUS | Status: DC | PRN
  Administered 2022-01-07: 120 mg via INTRAVENOUS

## 2022-01-07 MED ORDER — MORPHINE SULFATE (PF) 4 MG/ML IV SOLN
4.0000 mg | INTRAVENOUS | Status: DC | PRN
  Administered 2022-01-09 – 2022-01-21 (×19): 4 mg via INTRAVENOUS
  Filled 2022-01-07 (×20): qty 1

## 2022-01-07 MED ORDER — SODIUM CHLORIDE 0.9 % IV SOLN: INTRAVENOUS | Status: DC | PRN

## 2022-01-07 MED ORDER — SUCCINYLCHOLINE CHLORIDE 200 MG/10ML IV SOSY
PREFILLED_SYRINGE | INTRAVENOUS | Status: DC | PRN
  Administered 2022-01-07: 160 mg via INTRAVENOUS

## 2022-01-07 MED ORDER — BUPIVACAINE-EPINEPHRINE (PF) 0.5% -1:200000 IJ SOLN
INTRAMUSCULAR | Status: DC | PRN
  Administered 2022-01-07: 50 mL

## 2022-01-07 MED ORDER — SUGAMMADEX SODIUM 200 MG/2ML IV SOLN
INTRAVENOUS | Status: DC | PRN
  Administered 2022-01-07: 200 mg via INTRAVENOUS

## 2022-01-07 MED ORDER — CEFAZOLIN SODIUM-DEXTROSE 2-4 GM/100ML-% IV SOLN
2.0000 g | Freq: Once | INTRAVENOUS | Status: AC
  Administered 2022-01-07: 2 g via INTRAVENOUS
  Filled 2022-01-07: qty 100

## 2022-01-07 MED ORDER — 0.9 % SODIUM CHLORIDE (POUR BTL) OPTIME
TOPICAL | Status: DC | PRN
  Administered 2022-01-07: 500 mL

## 2022-01-07 MED ORDER — ACETAMINOPHEN 10 MG/ML IV SOLN
INTRAVENOUS | Status: AC
  Filled 2022-01-07: qty 100

## 2022-01-07 MED ORDER — FENTANYL CITRATE (PF) 100 MCG/2ML IJ SOLN
INTRAMUSCULAR | Status: AC
  Filled 2022-01-07: qty 2

## 2022-01-07 MED ORDER — ROCURONIUM BROMIDE 100 MG/10ML IV SOLN
INTRAVENOUS | Status: DC | PRN
  Administered 2022-01-07: 50 mg via INTRAVENOUS
  Administered 2022-01-07: 20 mg via INTRAVENOUS

## 2022-01-07 MED ORDER — EPHEDRINE SULFATE (PRESSORS) 50 MG/ML IJ SOLN
INTRAMUSCULAR | Status: DC | PRN
  Administered 2022-01-07 (×2): 10 mg via INTRAVENOUS
  Administered 2022-01-07: 5 mg via INTRAVENOUS

## 2022-01-07 MED ORDER — LIDOCAINE HCL (PF) 2 % IJ SOLN
INTRAMUSCULAR | Status: AC
  Filled 2022-01-07: qty 5

## 2022-01-07 MED ORDER — FENTANYL CITRATE (PF) 100 MCG/2ML IJ SOLN
INTRAMUSCULAR | Status: DC | PRN
  Administered 2022-01-07: 50 ug via INTRAVENOUS

## 2022-01-07 SURGICAL SUPPLY — 27 items
DRAPE INCISE IOBAN 66X45 STRL (DRAPES) IMPLANT
DRAPE LAPAROTOMY 100X77 ABD (DRAPES) ×1 IMPLANT
DRSG OPSITE POSTOP 4X10 (GAUZE/BANDAGES/DRESSINGS) IMPLANT
ELECT REM PT RETURN 9FT ADLT (ELECTROSURGICAL) ×1
ELECTRODE REM PT RTRN 9FT ADLT (ELECTROSURGICAL) ×1 IMPLANT
GAUZE SPONGE 4X4 12PLY STRL (GAUZE/BANDAGES/DRESSINGS) IMPLANT
GLOVE BIO SURGEON STRL SZ 6.5 (GLOVE) ×1 IMPLANT
GLOVE BIOGEL PI IND STRL 6.5 (GLOVE) ×1 IMPLANT
GOWN STRL REUS W/ TWL LRG LVL3 (GOWN DISPOSABLE) ×2 IMPLANT
GOWN STRL REUS W/TWL LRG LVL3 (GOWN DISPOSABLE) ×2
KIT TURNOVER KIT A (KITS) ×1 IMPLANT
LABEL OR SOLS (LABEL) ×1 IMPLANT
MANIFOLD NEPTUNE II (INSTRUMENTS) ×1 IMPLANT
NS IRRIG 1000ML POUR BTL (IV SOLUTION) ×1 IMPLANT
PACK BASIN MAJOR ARMC (MISCELLANEOUS) ×1 IMPLANT
SOL PREP PVP 2OZ (MISCELLANEOUS) ×1
SOLUTION PREP PVP 2OZ (MISCELLANEOUS) IMPLANT
STAPLER SKIN PROX 35W (STAPLE) IMPLANT
SUT SILK 2 0 (SUTURE) ×1
SUT SILK 2-0 18XBRD TIE 12 (SUTURE) ×1 IMPLANT
SUT STRATAFIX 0 PDS+ CT-2 23 (SUTURE) ×2
SUT VIC AB 2-0 SH 27 (SUTURE) ×1
SUT VIC AB 2-0 SH 27XBRD (SUTURE) IMPLANT
SUTURE STRATFX 0 PDS+ CT-2 23 (SUTURE) IMPLANT
SYR 30ML LL (SYRINGE) IMPLANT
TRAP FLUID SMOKE EVACUATOR (MISCELLANEOUS) ×1 IMPLANT
WATER STERILE IRR 500ML POUR (IV SOLUTION) ×1 IMPLANT

## 2022-01-07 NOTE — TOC Initial Note (Signed)
Transition of Care (TOC) - Initial/Assessment Note    Patient Details  Name: Troy Berry. MRN: [id number] Date of Birth: 20-Aug-1945  Transition of Care [facility]) CM/SW Contact:    Candie Chroman, LCSW Phone Number: 01/07/2022, 1:04 PM  Clinical Narrative:  Patient sleeping and only oriented to self. Wife and son-in-law at bedside. CSW introduced role and explained that discharge planning would be discussed. PCP is [facility]. He does not see a specific provider there, just whoever is available. Daughter and son-in-law typically transport to appointments. SDOH flag for transportation issues which wife and son-in-law confirmed at times. Patient has [employer] so gave information on [place] transportation benefits. Pharmacy is Pa[facility] nurse on [address]. No issues obtaining medications however patient's wife is interested in a Optometrist. Encouraged her to reach out to PCP office about this. No home health prior to admission although wife is interested in this. Asked MD to consult PT and OT when appropriate. Patient has a walker at home and also uses a bed pan. No further concerns. CSW encouraged patient's family to contact CSW as needed. CSW will continue to follow patient and his family for support and facilitate return home once stable. Son-in-law will likely transport at discharge.                Expected Discharge Plan:  (TBD) Barriers to Discharge: Continued Medical Work up   Patient Goals and CMS Choice Patient states their goals for this hospitalization and ongoing recovery are:: Patient not fully oriented.      Expected Discharge Plan and Services Expected Discharge Plan:  (TBD)     Post Acute Care Choice:  (TBD) Living arrangements for the past 2 months: Single Family Home                                      Prior Living Arrangements/Services Living arrangements for the past 2 months: Single Family Home Lives with:: Spouse Patient language and  need for interpreter reviewed:: Yes Do you feel safe going back to the place where you live?: Yes      Need for Family Participation in Patient Care: Yes (Comment) Care giver support system in place?: Yes (comment) Current home services: DME Criminal Activity/Legal Involvement Pertinent to Current Situation/Hospitalization: No - Comment as needed  Activities of Daily Living Home Assistive Devices/Equipment: Gilford Rile (specify type) ADL Screening (condition at time of admission) Patient's cognitive ability adequate to safely complete daily activities?: No Is the patient deaf or have difficulty hearing?: No Does the patient have difficulty seeing, even when wearing glasses/contacts?: No Does the patient have difficulty concentrating, remembering, or making decisions?: Yes Patient able to express need for assistance with ADLs?: Yes Does the patient have difficulty dressing or bathing?: Yes Independently performs ADLs?: No Communication: Independent Dressing (OT): Needs assistance Is this a change from baseline?: Change from baseline, expected to last <3days Grooming: Needs assistance Is this a change from baseline?: Change from baseline, expected to last >3 days Feeding: Independent Bathing: Needs assistance Is this a change from baseline?: Change from baseline, expected to last >3 days Toileting: Needs assistance Is this a change from baseline?: Change from baseline, expected to last >3days In/Out Bed: Needs assistance Is this a change from baseline?: Pre-admission baseline Walks in Home: Independent with device (comment) Does the patient have difficulty walking or climbing stairs?: Yes Weakness of Legs: Both Weakness of Arms/Hands: Both  Permission Sought/Granted Permission sought to share information with : Family Supports Permission granted to share information with : Yes, Verbal Permission Granted  Share Information with NAME: Jannetta Quint, [place] granted  to share info w Relationship: Wife and son-in-law  Permission granted to share info w Contact Information: [place] Median: [phone]: [phone]  Emotional Assessment Appearance:: Appears stated age Attitude/Demeanor/Rapport: Unable to Assess Affect (typically observed): Unable to Assess Orientation: : Oriented to Self Alcohol / Substance Use: Not Applicable Psych Involvement: No (comment)  Admission diagnosis:  Acute on chronic renal failure ([place]) [N17.9, N18.9] Gross hematuria [R31.0] Patient Active Problem List   Diagnosis Date Noted   Acute blood loss anemia 01/05/2022   Dementia without behavioral disturbance ([place]) 01/04/2022   Hypokalemia 01/04/2022   Acute renal failure superimposed on stage 4 chronic kidney disease ([place]) 01/03/2022   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[place]) 14/70/9295   Metabolic acidosis 74/73/4037   Hematuria 01/03/2022   SBO (small bowel obstruction) ([place]) 01/03/2022   Gross hematuria    Right inguinal hernia 10/13/2019   Acute lower UTI 04/29/2018   AKI (acute kidney injury) ([place])    Other hydronephrosis    Urinary retention    ARF (acute renal failure) ([place]) 07/29/2017   Anemia 03/09/2014   Chronic kidney disease 03/09/2014   Essential hypertension 03/09/2014   PCP:  [facility] [place]:   [address] [place]) [place] [address] Phone: [phone] Fax: [phone]     Social Determinants of Health (SDOH) Interventions Transportation Interventions: Inpatient TOC, Other (Comment) (Transportation provided through [employer])  Readmission Risk Interventions     No data to display

## 2022-01-07 NOTE — Progress Notes (Signed)
Patient ID: Troy Beals., male   DOB: 03/17/1946, 76 y.o.   MRN: [id number]     [place] Hospital Day(s): 3.   Interval History: Patient seen and examined, no acute events or new complaints overnight. Patient ambulated this morning without any significant clinical changes.  There has been no events reported.  No nausea or vomiting.  No bowel movement.  Abdominal x-ray this morning shows persistent small bowel dilation with paucity of air in the small bowel consistent with small bowel obstruction.  Present evaluated the images.  Vital signs in last 24 hours: [min-max] current  Temp:  [98.6 F (37 C)-99.8 F (37.7 C)] 99.3 F (37.4 C) (10/03 1213) Pulse Rate:  [65-77] 71 (10/03 1213) Resp:  [16-20] 20 (10/03 1213) BP: (119-135)/(51-63) 125/62 (10/03 1213) SpO2:  [92 %-100 %] 100 % (10/03 1213)     Height: '5\' 9"'$  (175.3 cm) Weight: 72.4 kg BMI (Calculated): 23.56   Physical Exam:  Constitutional: alert, cooperative and no distress  Respiratory: breathing non-labored at rest  Cardiovascular: regular rate and sinus rhythm  Gastrointestinal: soft, non-tender, and distended  Labs:     Latest Ref Rng & Units 01/06/2022    5:23 AM 01/05/2022    5:21 AM 01/04/2022    7:31 AM  CBC  WBC 4.0 - 10.5 K/uL 10.3  10.1  7.9   Hemoglobin 13.0 - 17.0 g/dL 8.6  9.1  7.0   Hematocrit 39.0 - 52.0 % 25.6  26.9  21.4   Platelets 150 - 400 K/uL 288  274  240       Latest Ref Rng & Units 01/07/2022    5:38 AM 01/06/2022    5:23 AM 01/05/2022    5:21 AM  CMP  Glucose 70 - 99 mg/dL 111  103  76   BUN 8 - 23 mg/dL 94  95  97   Creatinine 0.61 - 1.24 mg/dL 5.08  4.97  4.96   Sodium 135 - 145 mmol/L 140  134  134   Potassium 3.5 - 5.1 mmol/L 3.1  2.8  3.5   Chloride 98 - 111 mmol/L 104  104  106   CO2 22 - 32 mmol/L '23  21  17   '$ Calcium 8.9 - 10.3 mg/dL 9.7  9.8  10.1     Imaging studies: No new pertinent imaging studies   Assessment/Plan:  76 y.o. male with ileus, complicated  by pertinent comorbidities including acute over chronic kidney disease, hypertension, hyponatremia, anemia.  -Patient continue without improvement clinically of his ileus/small bowel obstruction. -X-ray today more consistent with small bowel obstruction.  He has been 24 hours after administration of Gastrografin.  The fact that there was no improvement with Gastrografin challenge I discussed with family about recommendation of exploratory laparotomy to look and treat the cause of the bowel obstruction. -I discussed with the wife and the son-in-law present in the room about the goals of surgery that is to resolve the bowel obstruction.  I discussed the risks that includes bleeding, infection, injury to adjacent organ, fistula from intestine, bowel obstruction, intra-abdominal abscess, among others.  The wife reported she understood and agreed to proceed with surgical intervention.  Arnold Long, MD

## 2022-01-07 NOTE — Anesthesia Procedure Notes (Signed)
Procedure Name: Intubation Date/Time: 01/07/2022 3:30 PM  Performed by: Tollie Eth, CRNAPre-anesthesia Checklist: Patient identified, Patient being monitored, Timeout performed, Emergency Drugs available and Suction available Patient Re-evaluated:Patient Re-evaluated prior to induction Oxygen Delivery Method: Circle system utilized Preoxygenation: Pre-oxygenation with 100% oxygen Induction Type: IV induction, Rapid sequence and Cricoid Pressure applied Laryngoscope Size: McGraph and 4 Grade View: Grade I Tube type: Oral Tube size: 7.5 mm Number of attempts: 1 Airway Equipment and Method: Stylet Placement Confirmation: ETT inserted through vocal cords under direct vision, positive ETCO2 and breath sounds checked- equal and bilateral Secured at: 23 cm Tube secured with: Tape Dental Injury: Teeth and Oropharynx as per pre-operative assessment  Comments: NGT to suction throughout induction

## 2022-01-07 NOTE — Anesthesia Postprocedure Evaluation (Signed)
Anesthesia Post Note  Patient: Troy Berry.  Procedure(s) Performed: EXPLORATORY LAPAROTOMY  Patient location during evaluation: PACU Anesthesia Type: General Level of consciousness: awake and alert Pain management: pain level controlled Vital Signs Assessment: post-procedure vital signs reviewed and stable Respiratory status: spontaneous breathing, nonlabored ventilation, respiratory function stable and patient connected to nasal cannula oxygen Cardiovascular status: blood pressure returned to baseline and stable Postop Assessment: no apparent nausea or vomiting Anesthetic complications: no   No notable events documented.   Last Vitals:  Vitals:   01/07/22 1850 01/07/22 1931  BP: (!) 108/58 104/62  Pulse: 69 (!) 58  Resp:  18  Temp: (!) 36.3 C 36.4 C  SpO2: 96% 99%    Last Pain:  Vitals:   01/07/22 1850  TempSrc: Oral  PainSc: 0-No pain                 Molli Barrows

## 2022-01-07 NOTE — Assessment & Plan Note (Addendum)
Secondary to obstructive uropathy.  Has been on cefepime since admission.  Procalcitonin on 10/3 elevated at 8.6 (no previous reference.)  Repeat labs in the morning.  Completed course of cefepime, off antibiotics

## 2022-01-07 NOTE — Care Management Important Message (Signed)
Important Message  Patient Details  Name: Troy Berry. MRN: [id number] Date of Birth: Jan 14, 1946   Medicare Important Message Given:  Yes     Dannette Barbara 01/07/2022, 11:33 AM

## 2022-01-07 NOTE — Op Note (Signed)
Preoperative diagnosis: Small bowel obstruction.  Postoperative diagnosis: Small bowel obstruction .  Procedure: Exploratory laparotomy and reduction of internal hernia  Anesthesia: GETA  Surgeon: Dr. Windell Moment, MD  Wound Classification: Clean   Indications: Patient is a 76 y.o. male who presented 5 days ago with picture consistent with partial small bowel obstruction that progressed to complete bowel obstruction unresponsive to conservative measures.   Findings: Internal hernia identified with viable small bowel No ischemic intestine No suprapubic catheter seen intra peritoneal.   Description of procedure: The patient was placed in the supine position and general endotracheal anesthesia was induced. Preoperative antibiotics were given. The abdomen was prepped and draped in the usual sterile fashion. A timeout was completed verifying correct patient, procedure, site, positioning, and implant(s) and/or special equipment prior to beginning this procedure.  A vertical midline incision was made from xiphoid to just below the umbilicus. This was deepened through the subcutaneous tissues and hemostasis was achieved with electrocautery. The linea alba was identified and incised and the peritoneal cavity entered with care.  Upon entering the abdominal cavity, there are extensive serous fluid. Dilated small bowel intestine from ligament of treitz to mid ileum. This was found in an internal hernia. Small bowel reduced and no ischemic tissue was identified. Internal hernia closed. No other pathology identified.  The small bowel was run from the ligament of Treitz to the ileocecal valve and no other cause of obstruction was identified. The entire small bowel was then inspected for viability and the absence of any additional obstructing bands. Small bowel content was milked proximally to be aspirated by the nasogastric tube and distally into the colon, again confirming patency and integrity throughout  its entire length. The abdominal cavity was copiously washed with saline and hemostasis was obtained.  The fascia was closed with a running suture of Stratafix 0.  The skin was closed with skin staples.  The patient tolerated the procedure well and was taken to the postanesthesia care unit in stable condition.   Specimen: None  Complications: None  Estimated Blood Loss: 10 mL

## 2022-01-07 NOTE — TOC CM/SW Note (Signed)
  Transition of Care [facility]) Screening Note   Patient Details  Name: Troy Berry. Date of Birth: 1946-03-22   Transition of Care [facility]) CM/SW Contact:    Candie Chroman, LCSW Phone Number: 01/07/2022, 8:25 AM    Transition of Care Department [facility]) has reviewed patient and no TOC needs have been identified at this time. We will continue to monitor patient advancement through interdisciplinary progression rounds. If new patient transition needs arise, please place a TOC consult.

## 2022-01-07 NOTE — Progress Notes (Signed)
[facility] Progress Note  Patient: Troy Berry.    ZWC:[id number]  DOA: 01/03/2022    Date of Service: the patient was seen and examined on 01/07/2022  Brief hospital course: 76 year old male past medical history have atonic bladder status post suprapubic catheter plus stage IV chronic kidney disease and secondary hypertension who presented to the emergency room on 9/30 with complaints of suprapubic catheter dysfunction as well as abdominal distention.  Patient found to have possible SBO versus ileus.  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  Started on cefepime for possible UTI.  NG tube placed for abdominal distention.  General surgery and urology consulted.  By 10/3, noted to have worsening renal function and nephrology consulted.  No improvement in small bowel obstruction and patient taken to the OR for ex lap and reduction of internal hernia.  Assessment and Plan: Assessment and Plan: * Acute renal failure superimposed on stage 4 chronic kidney disease ([place]) Patient has a history of CKD stage IV with most recent GFR ranging between 24 and 17.  On admission, creatinine elevated at 4.85 compared to prior 3.5.  This is likely secondary to obstructive uropathy in the setting of suprapubic catheter dysfunction.  Urology has replaced suprapubic catheter with cystogram confirming tip within bladder.   Renal function continues to worsen and by 10/3, up to 5.08 with a GFR of 11.  Nephrology consulted and will see patient on 10/4. (Patient in OR most of 10/3)  Suprapubic catheter dysfunction [facility]) Patient has a history of atonic bladder requiring suprapubic catheter placement approximately 1 month ago on 12/05/2021.  On 12/30/2021, catheter was replaced by IR.  Since that time, patient has been experiencing difficulties including hematuria and poor urinary drainage.  He was reevaluated by urology and IR on 9/27 without any  improvement.  On admission, it was discovered that suprapubic catheter tip was not in the bladder but adjacent to the small bowel.  Urology consulted and suprapubic catheter replaced with cystogram demonstrating appropriate placement within bladder.  - Urology following; appreciate their recommendations  SBO (small bowel obstruction) ([name]) Initial CT renal study with evidence of some small bowel dilation with no focal point demonstrated.  CT cystogram demonstrates persistent small bowel dilation with evidence of focal transition and in the central mesentery.  Findings are consistent with small bowel obstruction.  General surgery consulted and NG tube placed.  With no improvement, patient taken to the OR for ex lap and reduction of hernia on 10/3.  Anemia Patient has a history of anemia of chronic kidney disease on oral iron supplementation.  Hemoglobin on admission below baseline at 7.7.  Likely in the setting of large volume hematuria over the previous 5 days prior to admission.  Hemoglobin down to 7.0 on 9/30 and transfused 1 unit of packed red blood cells.  Continue iron supplementation  Hematuria Secondary to suprapubic catheter dysfunction.  Urinary tract infection Secondary to obstructive uropathy.  Has been on cefepime since admission.  Procalcitonin on 10/3 elevated at 8.6 (no previous reference.)  Repeat labs in the morning.  Continue cefepime.  Hyponatremia-resolved as of 01/06/2022 Sodium decreased at 128 on admission. Suspect this is secondary to acute on chronic renal failure.  Given gross hematuria, unable to obtain urine studies for further evaluation.  Following hydration, sodium up to 235  Metabolic acidosis Secondary to acute on chronic renal failure with elevated BUN.  No indication for bicarb at this time.  Essential  hypertension Patient currently takes amlodipine, carvedilol, and Lasix for hypertension.    - Restart home amlodipine and carvedilol - Holding home Lasix  given AKI  Hypokalemia Hesitant to aggressively replace given acute on chronic advanced renal failure.  Potassium 2.8.  Dementia without behavioral disturbance (HCC) Stable at baseline       Body mass index is 23.57 kg/m.        Consultants: Urology General surgery Nephrology  Procedures: Transfusion 1 unit packed red blood cells Replacement of Foley catheter Exploratory laparotomy with reduction of hernia  Antimicrobials: IV cefepime 9/30-present  Code Status: Full code   Subjective: Patient more sedated this morning, seen before he went to the OR  Objective: Vital signs were reviewed and unremarkable. Vitals:   01/07/22 1850 01/07/22 1931  BP: (!) 108/58 104/62  Pulse: 69 (!) 58  Resp:  18  Temp: (!) 97.4 F (36.3 C) 97.6 F (36.4 C)  SpO2: 96% 99%    Intake/Output Summary (Last 24 hours) at 01/07/2022 2210 Last data filed at 01/07/2022 1800 Gross per 24 hour  Intake 1161.68 ml  Output 1980 ml  Net -818.32 ml    Filed Weights   01/03/22 1250 01/04/22 0408 01/05/22 0421  Weight: 73 kg 72.8 kg 72.4 kg   Body mass index is 23.57 kg/m.  Exam:  General: Somnolent HEENT: Normocephalic, atraumatic, mucous membranes slightly dry Cardiovascular: Regular rate and rhythm, S1-S2 Respiratory: Clear to auscultation bilaterally Abdomen: moderately distended, absent bowel sounds, minimal tenderness Musculoskeletal: No clubbing or cyanosis, trace pitting edema Skin: Skin breaks, tears or lesions Psychiatry: Underlying dementia, no acute psychoses Neurology: No focal deficits  Data Reviewed: Procalcitonin of 8.66, increased creatinine of 5.03  Disposition:  Status is: Inpatient Remains inpatient appropriate because:  -Follow-up on bowel awakening -Improvement in renal function -Treatment of infection    Anticipated discharge date: 10/7  Family Communication: Wife at the bedside DVT Prophylaxis: SCDs Start: 01/03/22 1631    Author: Annita Brod ,MD 01/07/2022 10:10 PM  To reach On-call, see care teams to locate the attending and reach out via [email or web address]. Between 7PM-7AM, please contact night-coverage If you still have difficulty reaching the attending provider, please page the [facility] (Director on Call) for [facility] on amion for assistance.

## 2022-01-07 NOTE — Transfer of Care (Signed)
Immediate Anesthesia Transfer of Care Note  Patient: Troy Berry.  Procedure(s) Performed: EXPLORATORY LAPAROTOMY  Patient Location: PACU  Anesthesia Type:General  Level of Consciousness: drowsy  Airway & Oxygen Therapy: Patient Spontanous Breathing and Patient connected to face mask oxygen  Post-op Assessment: Report given to RN and Post -op Vital signs reviewed and stable  Post vital signs: Reviewed and stable  Last Vitals:  Vitals Value Taken Time  BP 106/54 01/07/22 1700  Temp 36.2 C 01/07/22 1700  Pulse 55 01/07/22 1710  Resp 14 01/07/22 1710  SpO2 97 % 01/07/22 1710  Vitals shown include unvalidated device data.  Last Pain:  Vitals:   01/07/22 1700  TempSrc:   PainSc: 0-No pain         Complications: No notable events documented.

## 2022-01-07 NOTE — Anesthesia Preprocedure Evaluation (Addendum)
Anesthesia Evaluation  Patient identified by MRN, date of birth, ID band Patient awake    Reviewed: Allergy & Precautions, H&P , NPO status , Patient's Chart, lab work & pertinent test results, reviewed documented beta blocker date and time   History of Anesthesia Complications Negative for: history of anesthetic complications  Airway Mallampati: II  TM Distance: >3 FB Neck ROM: full    Dental  (+) Poor Dentition, Dental Advidsory Given, Missing   Pulmonary neg pulmonary ROS, former smoker,    Pulmonary exam normal breath sounds clear to auscultation       Cardiovascular Exercise Tolerance: Good hypertension, (-) angina(-) Past MI and (-) Cardiac Stents negative cardio ROS Normal cardiovascular exam(-) dysrhythmias (-) Valvular Problems/Murmurs Rhythm:regular Rate:Normal     Neuro/Psych PSYCHIATRIC DISORDERS Dementia negative neurological ROS     GI/Hepatic Neg liver ROS, GERD  ,  Endo/Other  negative endocrine ROS  Renal/GU CRFRenal disease  negative genitourinary   Musculoskeletal   Abdominal   Peds  Hematology  (+) Blood dyscrasia, anemia ,   Anesthesia Other Findings Past Medical History: No date: Anemia No date: CKD (chronic kidney disease), stage III (HCC) No date: Gout No date: Hypertension No date: Urinary retention     Comment:  in-dwelling Foley since Nov 2019, sees Dr. Hollice Espy   Reproductive/Obstetrics negative OB ROS                             Anesthesia Physical Anesthesia Plan  ASA: 3  Anesthesia Plan: General   Post-op Pain Management:    Induction: Intravenous, Rapid sequence and Cricoid pressure planned  PONV Risk Score and Plan: 2 and Ondansetron, Dexamethasone and Treatment may vary due to age or medical condition  Airway Management Planned: Oral ETT  Additional Equipment:   Intra-op Plan:   Post-operative Plan: Extubation in  OR  Informed Consent: I have reviewed the patients History and Physical, chart, labs and discussed the procedure including the risks, benefits and alternatives for the proposed anesthesia with the patient or authorized representative who has indicated his/her understanding and acceptance.     Dental Advisory Given  Plan Discussed with: Anesthesiologist, CRNA and Surgeon  Anesthesia Plan Comments:        Anesthesia Quick Evaluation

## 2022-01-07 NOTE — Plan of Care (Signed)

## 2022-01-08 ENCOUNTER — Encounter: Payer: Self-pay | Admitting: General Surgery

## 2022-01-08 DIAGNOSIS — N179 Acute kidney failure, unspecified: Secondary | ICD-10-CM | POA: Diagnosis not present

## 2022-01-08 DIAGNOSIS — F0390 Unspecified dementia without behavioral disturbance: Secondary | ICD-10-CM | POA: Diagnosis not present

## 2022-01-08 DIAGNOSIS — K56609 Unspecified intestinal obstruction, unspecified as to partial versus complete obstruction: Secondary | ICD-10-CM | POA: Diagnosis not present

## 2022-01-08 DIAGNOSIS — N184 Chronic kidney disease, stage 4 (severe): Secondary | ICD-10-CM | POA: Diagnosis not present

## 2022-01-08 LAB — BASIC METABOLIC PANEL
Anion gap: 10 (ref 5–15)
BUN: 96 mg/dL — ABNORMAL HIGH (ref 8–23)
CO2: 28 mmol/L (ref 22–32)
Calcium: 9.5 mg/dL (ref 8.9–10.3)
Chloride: 104 mmol/L (ref 98–111)
Creatinine, Ser: 5.15 mg/dL — ABNORMAL HIGH (ref 0.61–1.24)
GFR, Estimated: 11 mL/min — ABNORMAL LOW (ref >60)
Glucose, Bld: 167 mg/dL — ABNORMAL HIGH (ref 70–99)
Potassium: 3.5 mmol/L (ref 3.5–5.1)
Sodium: 142 mmol/L (ref 135–145)

## 2022-01-08 LAB — CBC
HCT: 25.5 % — ABNORMAL LOW (ref 39.0–52.0)
Hemoglobin: 8.3 g/dL — ABNORMAL LOW (ref 13.0–17.0)
MCH: 26.7 pg (ref 26.0–34.0)
MCHC: 32.5 g/dL (ref 30.0–36.0)
MCV: 82 fL (ref 80.0–100.0)
Platelets: 293 10*3/uL (ref 150–400)
RBC: 3.11 MIL/uL — ABNORMAL LOW (ref 4.22–5.81)
RDW: 15.4 % (ref 11.5–15.5)
WBC: 12.2 10*3/uL — ABNORMAL HIGH (ref 4.0–10.5)
nRBC: 0 % (ref 0.0–0.2)

## 2022-01-08 LAB — PROCALCITONIN: Procalcitonin: 6.11 ng/mL

## 2022-01-08 MED ORDER — ACETAMINOPHEN 650 MG RE SUPP
650.0000 mg | Freq: Four times a day (QID) | RECTAL | Status: DC | PRN
  Administered 2022-01-26: 650 mg via RECTAL
  Filled 2022-01-08: qty 1

## 2022-01-08 MED ORDER — ACETAMINOPHEN 325 MG PO TABS
650.0000 mg | ORAL_TABLET | Freq: Four times a day (QID) | ORAL | Status: DC | PRN
  Administered 2022-01-09 – 2022-01-29 (×5): 650 mg via ORAL
  Filled 2022-01-08 (×5): qty 2

## 2022-01-08 NOTE — Consult Note (Signed)
[facility] Consult Note:01/08/22    Date of Admission:  01/03/2022           Reason for Consult: Acute kidney injury    Referring Provider: Wyvonnia Dusky, MD Primary Care Provider: [facility], [place]   History of Presenting Illness:  Troy Mika. is a 76 y.o. male with multiple medical problems including atonic bladder status post suprapubic catheter, chronic kidney disease, anemia, hypertension, gout presented to the emergency room with hematuria and abdominal distention.  Patient is not able to provide any meaningful information.  All information is obtained from the chart and sitter who is available in the room. Per chart, patient had suprapubic catheter placed about a month ago.  He had persistent low-volume hematuria in his Foley bag.  He underwent exchange of suprapubic catheter on 12/30/2021.  Increased blood-tinged urine output was noted in the Foley bag and around suprapubic catheter.  CT scan of the abdomen showed pubic catheter tip present adjacent to the small bowel loop not located within the bladder.  In addition there was hyperdense material in the left renal collecting system superior pole likely hemorrhage.  Dilated bowel loops were noted. Patient had surgical evaluation and underwent exploratory laparotomy.  He was found to have partial small bowel obstruction that progressed to complete bowel obstruction unresponsive to conservative measures.  Internal hernia was identified with viable small bowel. In addition, patient is noted to have acute kidney injury.  His baseline creatinine seems to be 3.52 from September 28, 2021.  Admission creatinine of 4.85 which has increased to 5.15 today. Patient, when seen is resting comfortably.  Able to do simple commands.  Does not speak much.  Sitter in the room reports that he was pulling on the Foley catheter, as well as NG tube.  Review of Systems: ROS not available due to patient's current  illness.  Past Medical History:  Diagnosis Date   Anemia    CKD (chronic kidney disease), stage III ([place])    Gout    Hypertension    Urinary retention    in-dwelling Foley since Nov 2019, sees Dr. Hollice Espy    Social History   Tobacco Use   Smoking status: Former    Types: Cigarettes    Quit date: 04/07/1998    Years since quitting: 23.7    Passive exposure: Past   Smokeless tobacco: Never  Vaping Use   Vaping Use: Never used  Substance Use Topics   Alcohol use: Yes   Drug use: Never    Family History  Problem Relation Age of Onset   Cancer Father      OBJECTIVE: Blood pressure (!) 136/55, pulse 66, temperature 99.3 F (37.4 C), resp. rate 17, height '5\' 9"'$  (1.753 m), weight 72.4 kg, SpO2 100 %.  Physical Exam General appearance: Chronically ill-appearing, laying in the bed HEENT: NG tube in place Pulmonary: Normal breathing effort, clear to auscultation Cardiovascular: Regular, no rub Abdomen: Soft, incisions clean and dry, suprapubic catheter in place Extremities: Trace to 1+ edema Neuro: Awake, speaks very little, able to follow simple commands  Lab Results Lab Results  Component Value Date   WBC 12.2 (H) 01/08/2022   HGB 8.3 (L) 01/08/2022   HCT 25.5 (L) 01/08/2022   MCV 82.0 01/08/2022   PLT 293 01/08/2022    Lab Results  Component Value Date   CREATININE 5.15 (H) 01/08/2022   BUN 96 (H) 01/08/2022   NA 142 01/08/2022   K 3.5  01/08/2022   CL 104 01/08/2022   CO2 28 01/08/2022    Lab Results  Component Value Date   ALT 12 01/03/2022   AST 13 (L) 01/03/2022   ALKPHOS 46 01/03/2022   BILITOT 0.7 01/03/2022     Microbiology: Recent Results (from the past 240 hour(s))  Urine Culture     Status: None   Collection Time: 01/05/22  8:56 AM   Specimen: Urine, Suprapubic  Result Value Ref Range Status   Specimen Description   Final    URINE, SUPRAPUBIC Performed at [facility], [address] [place], [place] [address]     Special Requests   Final    NONE Performed at [facility], [address], [place], [place] [address]    Culture   Final    NO GROWTH Performed at [facility], [place] [address], [place], [place] [address]    Report Status 01/06/2022 FINAL  Final    Medications: Scheduled Meds:  metoprolol tartrate  5 mg Intravenous Q8H   polyethylene glycol  17 g Oral Daily   sodium chloride flush  3 mL Intravenous Q12H   Continuous Infusions:  sodium chloride     ceFEPime (MAXIPIME) IV Stopped (01/07/22 2230)   sodium bicarbonate 150 mEq in dextrose 5 % 1,150 mL infusion 75 mL/hr at 01/08/22 0823   PRN Meds:.sodium chloride, acetaminophen **OR** acetaminophen, haloperidol lactate, morphine injection, sodium chloride flush  Allergies  Allergen Reactions   Ace Inhibitors     Other reaction(s): Other (See Comments) lip swelling    Urinalysis: No results for input(s): "COLORURINE", "LABSPEC", "PHURINE", "GLUCOSEU", "HGBUR", "BILIRUBINUR", "KETONESUR", "PROTEINUR", "UROBILINOGEN", "NITRITE", "LEUKOCYTESUR" in the last 72 hours.  Invalid input(s): "APPERANCEUR"    Imaging: DG Abd 2 Views  Result Date: 01/07/2022 CLINICAL DATA:  Ileus versus small-bowel obstruction EXAM: ABDOMEN - 2 VIEW COMPARISON:  Portable exam 0927 hours compared to 01/06/2022 FINDINGS: Nasogastric tube coiled in proximal stomach. Persistent small bowel dilatation. Relatively decreased gas and stool in colon though stool is present in rectum. No bowel wall thickening or free air. Diffuse osseous demineralization. Bibasilar atelectasis. IMPRESSION: Persistent small bowel dilatation with relative paucity of gas/stool in colon, favor small-bowel obstruction, little changed. Electronically Signed   By: Lavonia Dana M.D.   On: 01/07/2022 09:46   DG Abd Portable 1V  Result Date: 01/06/2022 CLINICAL DATA:  8 hour small bowel follow-up film EXAM: PORTABLE ABDOMEN - 1 VIEW COMPARISON:  Film from earlier in the same  day. FINDINGS: Suprapubic catheter is noted. Persistent small bowel dilatation is noted consistent with a least partial small bowel obstruction. Administered contrast lies entirely within the stomach. No passage into the small bowel is identified. IMPRESSION: Administered contrast lies within the stomach. Persistent small bowel obstructive changes are noted. Electronically Signed   By: Inez Catalina M.D.   On: 01/06/2022 19:15      Assessment/Plan:  Troy Mcfann. is a 76 y.o. male with medical problems of atonic bladder status post suprapubic catheter placement, chronic kidney disease, anemia, hypertension, gout    was admitted on 01/03/2022 for :  Acute on chronic renal failure ([place]) [N17.9, N18.9] Gross hematuria [R31.0] Hospital course complicated by small bowel obstruction status post exploratory laparotomy, hematuria around suprapubic site and in the Foley catheter,  Acute kidney injury  Likely ATN due to concurrent illness, small bowel obstruction. Urine output is low.  Serum creatinine is worsening. No uremic symptoms at present.  No acute indication for dialysis  however he may require dialysis if clinical condition does not improve.  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  Small bowel obstruction Status post exploratory laparotomy on 01/07/2022     Troy Berry 01/08/22

## 2022-01-08 NOTE — Plan of Care (Signed)

## 2022-01-08 NOTE — Progress Notes (Signed)
Patient ID: Troy Beals., male   DOB: 05/02/1945, 76 y.o.   MRN: [id number]     [place] Hospital Day(s): 4.   Interval History: Patient seen and examined, no acute events or new complaints overnight.  Patient seen without any distress.  She was awake and comfortable.  No reports of any issues or event overnight.  Vital signs in last 24 hours: [min-max] current  Temp:  [97 F (36.1 C)-99.3 F (37.4 C)] 98.2 F (36.8 C) (10/04 1533) Pulse Rate:  [54-69] 68 (10/04 1533) Resp:  [16-26] 17 (10/04 1533) BP: (101-136)/(52-66) 135/66 (10/04 1533) SpO2:  [92 %-100 %] 100 % (10/04 1533)     Height: '5\' 9"'$  (175.3 cm) Weight: 72.4 kg BMI (Calculated): 23.56   Physical Exam:  Constitutional: alert, cooperative and no distress  Respiratory: breathing non-labored at rest  Cardiovascular: regular rate and sinus rhythm  Gastrointestinal: soft, non-tender, and mild-distended  Labs:     Latest Ref Rng & Units 01/08/2022    4:21 AM 01/06/2022    5:23 AM 01/05/2022    5:21 AM  CBC  WBC 4.0 - 10.5 K/uL 12.2  10.3  10.1   Hemoglobin 13.0 - 17.0 g/dL 8.3  8.6  9.1   Hematocrit 39.0 - 52.0 % 25.5  25.6  26.9   Platelets 150 - 400 K/uL 293  288  274       Latest Ref Rng & Units 01/08/2022    4:21 AM 01/07/2022    5:38 AM 01/06/2022    5:23 AM  CMP  Glucose 70 - 99 mg/dL 167  111  103   BUN 8 - 23 mg/dL 96  94  95   Creatinine 0.61 - 1.24 mg/dL 5.15  5.08  4.97   Sodium 135 - 145 mmol/L 142  140  134   Potassium 3.5 - 5.1 mmol/L 3.5  3.1  2.8   Chloride 98 - 111 mmol/L 104  104  104   CO2 22 - 32 mmol/L '28  23  21   '$ Calcium 8.9 - 10.3 mg/dL 9.5  9.7  9.8     Imaging studies: No new pertinent imaging studies   Assessment/Plan:  76 y.o. male with small bowel obstruction 1 Day Post-Op s/p reduction of internal hernia.  -Patient today found with adequate vital signs.  No fever -Pain seems to be controlled, no significant complaint. -Abdominal exams still with mild  abdominal distention.  We will continue with NGT in place until we have good return of bowel function -Patient to get physical therapy as tolerated -We will continue to follow closely.  Arnold Long, MD

## 2022-01-08 NOTE — Progress Notes (Signed)
PROGRESS NOTE    Troy Berry.  LNL:[id number] DOB: 05-Mar-1946 DOA: 01/03/2022 PCP: [facility]   Assessment & Plan:   Principal Problem:   Acute renal failure superimposed on stage 4 chronic kidney disease ([place]) Active Problems:   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HCC)   SBO (small bowel obstruction) (HCC)   Anemia   Hematuria   Urinary tract infection   Metabolic acidosis   Essential hypertension   Gross hematuria   Dementia without behavioral disturbance (HCC)   Hypokalemia   Acute blood loss anemia  Assessment and Plan: AKI on CKDIV: Cr is trending up daily. Suprapubic catheter possibly contributing to AKI on CKD as well. Nephro is following and recs apprec    Suprapubic catheter dysfunction: w/ hx of atonic bladder requiring suprapubic catheter placement approx on 12/05/21. On 12/30/2021, catheter was replaced by IR.  Since that time, patient has been experiencing difficulties including hematuria and poor urinary drainage.  He was reevaluated by urology and IR on 9/27 without any improvement.  On admission, it was discovered that suprapubic catheter tip was not in the bladder but adjacent to the small bowel. Suprapubic catheter replaced with cystogram demonstrating appropriate placement within bladder as per urology   SBO: CT renal study with evidence of some small bowel dilation with no focal point demonstrated.  CT cystogram demonstrates persistent small bowel dilation with evidence of focal transition and in the central mesentery.  Findings are consistent with small bowel obstruction.  S/p NG tube placement. S/p ex lap & reduction of hernia on 01/07/22 as per gen surg    ACD: secondary to CKD. H&H are labile.    Hematuria: secondary to suprapubic catheter dysfunction    UTI: secondary to obstructive uropathy. Continue on IV cefepime   Hyponatremia: resolved as of 40/11/1446   Metabolic acidosis: resolved   HTN: continue on amlodipine, coreg.  Holding lasix  Hypokalemia: WNL today    Dementia: without behavioral disturbance. Continue w/ supportive care      DVT prophylaxis: SCDs Code Status: full  Family Communication: called pt's wife, Letta Median, no answer but I left a voicemail  Disposition Plan: depends on PT/OT recs (not consulted yet)   Level of care: Med-Surg  Status is: Inpatient  Remains inpatient appropriate because: severity of illness  Consultants:  Nephro General surg   Procedures:   Antimicrobials: cefepime   Subjective: Pt did not answer any of my questions today   Objective: Vitals:   01/07/22 1850 01/07/22 1931 01/08/22 0430 01/08/22 0714  BP: (!) 108/58 104/62 (!) 117/58 (!) 119/59  Pulse: 69 (!) 58 67 64  Resp:  '18 18 18  '$ Temp: (!) 97.4 F (36.3 C) 97.6 F (36.4 C) 98.7 F (37.1 C) 99.3 F (37.4 C)  TempSrc: Oral     SpO2: 96% 99% 94% 92%  Weight:      Height:        Intake/Output Summary (Last 24 hours) at 01/08/2022 0843 Last data filed at 01/07/2022 1800 Gross per 24 hour  Intake 1161.68 ml  Output 1980 ml  Net -818.32 ml   Filed Weights   01/03/22 1250 01/04/22 0408 01/05/22 0421  Weight: 73 kg 72.8 kg 72.4 kg    Examination:  General exam: Appears calm and comfortable  Respiratory system: Clear to auscultation. Respiratory effort normal. Cardiovascular system: S1 & S2+. No rubs, gallops or clicks.  Gastrointestinal system: Abdomen is nondistended, soft and nontender. Normal bowel sounds heard. Central nervous system: Alert and awake  Psychiatry: Judgement and insight appears poor. Flat mood and affect    Data Reviewed: I have personally reviewed following labs and imaging studies  CBC: Recent Labs  Lab 01/03/22 1252 01/04/22 0731 01/05/22 0521 01/06/22 0523 01/08/22 0421  WBC 10.0 7.9 10.1 10.3 12.2*  HGB 7.7* 7.0* 9.1* 8.6* 8.3*  HCT 23.2* 21.4* 26.9* 25.6* 25.5*  MCV 80.0 79.3* 78.9* 78.8* 82.0  PLT 272 240 274 288 277   Basic Metabolic Panel: Recent  Labs  Lab 01/04/22 0731 01/05/22 0521 01/06/22 0523 01/07/22 0538 01/08/22 0421  NA 130* 134* 134* 140 142  K 3.4* 3.5 2.8* 3.1* 3.5  CL 104 106 104 104 104  CO2 16* 17* 21* 23 28  GLUCOSE 102* 76 103* 111* 167*  BUN 99* 97* 95* 94* 96*  CREATININE 4.97* 4.96* 4.97* 5.08* 5.15*  CALCIUM 9.7 10.1 9.8 9.7 9.5  MG 2.2 2.3 2.2 2.4  --   PHOS 4.7*  --   --   --   --    GFR: Estimated Creatinine Clearance: 12 mL/min (A) (by C-G formula based on SCr of 5.15 mg/dL (H)). Liver Function Tests: Recent Labs  Lab 01/03/22 1252 01/04/22 0731  AST 13*  --   ALT 12  --   ALKPHOS 46  --   BILITOT 0.7  --   PROT 6.7  --   ALBUMIN 3.1* 2.6*   No results for input(s): "LIPASE", "AMYLASE" in the last 168 hours. No results for input(s): "AMMONIA" in the last 168 hours. Coagulation Profile: No results for input(s): "INR", "PROTIME" in the last 168 hours. Cardiac Enzymes: No results for input(s): "CKTOTAL", "CKMB", "CKMBINDEX", "TROPONINI" in the last 168 hours. BNP (last 3 results) No results for input(s): "PROBNP" in the last 8760 hours. HbA1C: No results for input(s): "HGBA1C" in the last 72 hours. CBG: Recent Labs  Lab 01/04/22 2012  GLUCAP 89   Lipid Profile: No results for input(s): "CHOL", "HDL", "LDLCALC", "TRIG", "CHOLHDL", "LDLDIRECT" in the last 72 hours. Thyroid Function Tests: No results for input(s): "TSH", "T4TOTAL", "FREET4", "T3FREE", "THYROIDAB" in the last 72 hours. Anemia Panel: No results for input(s): "VITAMINB12", "FOLATE", "FERRITIN", "TIBC", "IRON", "RETICCTPCT" in the last 72 hours. Sepsis Labs: Recent Labs  Lab 01/03/22 1814 01/07/22 0538  PROCALCITON  --  8.66  LATICACIDVEN 0.9  --     Recent Results (from the past 240 hour(s))  Urine Culture     Status: None   Collection Time: 01/05/22  8:56 AM   Specimen: Urine, Suprapubic  Result Value Ref Range Status   Specimen Description   Final    URINE, SUPRAPUBIC Performed at [facility],  [address] [place], [place] [address]    Special Requests   Final    NONE Performed at [facility], [address], [place], [place] [address]    Culture   Final    NO GROWTH Performed at [facility], [place] [address], [place], [place] [address]    Report Status 01/06/2022 FINAL  Final         Radiology Studies: DG Abd 2 Views  Result Date: 01/07/2022 CLINICAL DATA:  Ileus versus small-bowel obstruction EXAM: ABDOMEN - 2 VIEW COMPARISON:  Portable exam 0927 hours compared to 01/06/2022 FINDINGS: Nasogastric tube coiled in proximal stomach. Persistent small bowel dilatation. Relatively decreased gas and stool in colon though stool is present in rectum. No bowel wall thickening or free air. Diffuse osseous demineralization. Bibasilar atelectasis. IMPRESSION: Persistent small bowel  dilatation with relative paucity of gas/stool in colon, favor small-bowel obstruction, little changed. Electronically Signed   By: Lavonia Dana M.D.   On: 01/07/2022 09:46   DG Abd Portable 1V  Result Date: 01/06/2022 CLINICAL DATA:  8 hour small bowel follow-up film EXAM: PORTABLE ABDOMEN - 1 VIEW COMPARISON:  Film from earlier in the same day. FINDINGS: Suprapubic catheter is noted. Persistent small bowel dilatation is noted consistent with a least partial small bowel obstruction. Administered contrast lies entirely within the stomach. No passage into the small bowel is identified. IMPRESSION: Administered contrast lies within the stomach. Persistent small bowel obstructive changes are noted. Electronically Signed   By: Inez Catalina M.D.   On: 01/06/2022 19:15        Scheduled Meds:  metoprolol tartrate  5 mg Intravenous Q8H   polyethylene glycol  17 g Oral Daily   sodium chloride flush  3 mL Intravenous Q12H   Continuous Infusions:  sodium chloride     ceFEPime (MAXIPIME) IV Stopped (01/07/22 2230)   sodium bicarbonate 150 mEq in dextrose 5 % 1,150 mL infusion 75 mL/hr at 01/08/22  0823     LOS: 4 days    Time spent: 30 mins     Wyvonnia Dusky, MD [facility] Pager [phone]  If 7PM-7AM, please contact night-coverage [email or web address] 01/08/2022, 8:43 AM

## 2022-01-09 DIAGNOSIS — K56609 Unspecified intestinal obstruction, unspecified as to partial versus complete obstruction: Secondary | ICD-10-CM | POA: Diagnosis not present

## 2022-01-09 DIAGNOSIS — N184 Chronic kidney disease, stage 4 (severe): Secondary | ICD-10-CM | POA: Diagnosis not present

## 2022-01-09 DIAGNOSIS — N179 Acute kidney failure, unspecified: Secondary | ICD-10-CM | POA: Diagnosis not present

## 2022-01-09 DIAGNOSIS — F0390 Unspecified dementia without behavioral disturbance: Secondary | ICD-10-CM | POA: Diagnosis not present

## 2022-01-09 LAB — CBC
HCT: 23.4 % — ABNORMAL LOW (ref 39.0–52.0)
Hemoglobin: 7.6 g/dL — ABNORMAL LOW (ref 13.0–17.0)
MCH: 27 pg (ref 26.0–34.0)
MCHC: 32.5 g/dL (ref 30.0–36.0)
MCV: 83.3 fL (ref 80.0–100.0)
Platelets: 261 10*3/uL (ref 150–400)
RBC: 2.81 MIL/uL — ABNORMAL LOW (ref 4.22–5.81)
RDW: 15.7 % — ABNORMAL HIGH (ref 11.5–15.5)
WBC: 16.5 10*3/uL — ABNORMAL HIGH (ref 4.0–10.5)
nRBC: 0 % (ref 0.0–0.2)

## 2022-01-09 LAB — BASIC METABOLIC PANEL
Anion gap: 10 (ref 5–15)
BUN: 94 mg/dL — ABNORMAL HIGH (ref 8–23)
CO2: 35 mmol/L — ABNORMAL HIGH (ref 22–32)
Calcium: 9.7 mg/dL (ref 8.9–10.3)
Chloride: 100 mmol/L (ref 98–111)
Creatinine, Ser: 5.21 mg/dL — ABNORMAL HIGH (ref 0.61–1.24)
GFR, Estimated: 11 mL/min — ABNORMAL LOW (ref >60)
Glucose, Bld: 103 mg/dL — ABNORMAL HIGH (ref 70–99)
Potassium: 3.1 mmol/L — ABNORMAL LOW (ref 3.5–5.1)
Sodium: 145 mmol/L (ref 135–145)

## 2022-01-09 NOTE — Progress Notes (Signed)
PROGRESS NOTE    Troy Berry.  XBJ:[id number] DOB: [birth year] DOA: 01/03/2022 PCP: [facility]   Assessment & Plan:   Principal Problem:   Acute renal failure superimposed on stage 4 chronic kidney disease ([place]) Active Problems:   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HCC)   SBO (small bowel obstruction) (HCC)   Anemia   Hematuria   Urinary tract infection   Metabolic acidosis   Essential hypertension   Gross hematuria   Dementia without behavioral disturbance (HCC)   Hypokalemia   Acute blood loss anemia  Assessment and Plan: AKI on CKDIV: Cr is trending up daily. Suprapubic catheter possibly contributing to AKI on CKD as well. Nephro following and recs apprec    Suprapubic catheter dysfunction: w/ hx of atonic bladder requiring suprapubic catheter placement approx on 12/05/21. On 12/30/2021, catheter was replaced by IR.  Since that time, patient has been experiencing difficulties including hematuria and poor urinary drainage.  He was reevaluated by urology and IR on 9/27 without any improvement.  On admission, it was discovered that suprapubic catheter tip was not in the bladder but adjacent to the small bowel. Suprapubic catheter replaced with cystogram demonstrating appropriate placement within bladder as per urology   SBO: CT renal study with evidence of some small bowel dilation with no focal point demonstrated.  CT cystogram demonstrates persistent small bowel dilation with evidence of focal transition and in the central mesentery.  Findings are consistent with small bowel obstruction. S/p ex lap & reduction of hernia on 01/07/22 as per gen surg. Continue w/ NG tube placement until there is good return on bowel function as per gen surg    ACD: secondary to CKD & hematuria. Will transfuse if Hb < 7.0    Hematuria: still present.  Will transfuse pRBCs if Hb < 7.0    UTI: secondary to obstructive uropathy. Continue on IV cefepime x 10 days total     Hyponatremia: resolved   Metabolic acidosis: resolved   HTN: continue on amlodipine, coreg. Holding lasix  Hypokalemia: will hold off on replacement as Cr is trending up daily    Dementia: without behavioral disturbance. Continue w/ supportive care       DVT prophylaxis: SCDs Code Status: full  Family Communication: discussed pt's care w/ pt's wife at bedside and answered her questions  Disposition Plan: depends on PT/OT recs  Level of care: Med-Surg  Status is: Inpatient  Remains inpatient appropriate because: severity of illness  Consultants:  Nephro General surg   Procedures:   Antimicrobials: cefepime   Subjective: Pt did not have any complaints today. Pt does not talk much as per pt's wife   Objective: Vitals:   01/09/22 0436 01/09/22 0500 01/09/22 0601 01/09/22 0739  BP: (!) 128/54  123/60 131/66  Pulse: 70   77  Resp: 17   17  Temp: 98.5 F (36.9 C)   98.3 F (36.8 C)  TempSrc:      SpO2: 94%   99%  Weight:  68.1 kg    Height:        Intake/Output Summary (Last 24 hours) at 01/09/2022 0821 Last data filed at 01/09/2022 0600 Gross per 24 hour  Intake 1508.14 ml  Output 800 ml  Net 708.14 ml   Filed Weights   01/04/22 0408 01/05/22 0421 01/09/22 0500  Weight: 72.8 kg 72.4 kg 68.1 kg    Examination:  General exam: Appears restless Respiratory system: clear breath sounds b/l Cardiovascular system: S1/S2+. No rubs or  clicks  Gastrointestinal system: Abd is soft, NT, ND & hypoactive bowel sounds. NG tube in place Central nervous system: Alert and awake  Psychiatry: Judgement and insight appears poor. Flat mood and affect    Data Reviewed: I have personally reviewed following labs and imaging studies  CBC: Recent Labs  Lab 01/04/22 0731 01/05/22 0521 01/06/22 0523 01/08/22 0421 01/09/22 0605  WBC 7.9 10.1 10.3 12.2* 16.5*  HGB 7.0* 9.1* 8.6* 8.3* 7.6*  HCT 21.4* 26.9* 25.6* 25.5* 23.4*  MCV 79.3* 78.9* 78.8* 82.0 83.3  PLT 240  274 288 293 962   Basic Metabolic Panel: Recent Labs  Lab 01/04/22 0731 01/05/22 0521 01/06/22 0523 01/07/22 0538 01/08/22 0421 01/09/22 0605  NA 130* 134* 134* 140 142 145  K 3.4* 3.5 2.8* 3.1* 3.5 3.1*  CL 104 106 104 104 104 100  CO2 16* 17* 21* 23 28 35*  GLUCOSE 102* 76 103* 111* 167* 103*  BUN 99* 97* 95* 94* 96* 94*  CREATININE 4.97* 4.96* 4.97* 5.08* 5.15* 5.21*  CALCIUM 9.7 10.1 9.8 9.7 9.5 9.7  MG 2.2 2.3 2.2 2.4  --   --   PHOS 4.7*  --   --   --   --   --    GFR: Estimated Creatinine Clearance: 11.4 mL/min (A) (by C-G formula based on SCr of 5.21 mg/dL (H)). Liver Function Tests: Recent Labs  Lab 01/03/22 1252 01/04/22 0731  AST 13*  --   ALT 12  --   ALKPHOS 46  --   BILITOT 0.7  --   PROT 6.7  --   ALBUMIN 3.1* 2.6*   No results for input(s): "LIPASE", "AMYLASE" in the last 168 hours. No results for input(s): "AMMONIA" in the last 168 hours. Coagulation Profile: No results for input(s): "INR", "PROTIME" in the last 168 hours. Cardiac Enzymes: No results for input(s): "CKTOTAL", "CKMB", "CKMBINDEX", "TROPONINI" in the last 168 hours. BNP (last 3 results) No results for input(s): "PROBNP" in the last 8760 hours. HbA1C: No results for input(s): "HGBA1C" in the last 72 hours. CBG: Recent Labs  Lab 01/04/22 2012  GLUCAP 89   Lipid Profile: No results for input(s): "CHOL", "HDL", "LDLCALC", "TRIG", "CHOLHDL", "LDLDIRECT" in the last 72 hours. Thyroid Function Tests: No results for input(s): "TSH", "T4TOTAL", "FREET4", "T3FREE", "THYROIDAB" in the last 72 hours. Anemia Panel: No results for input(s): "VITAMINB12", "FOLATE", "FERRITIN", "TIBC", "IRON", "RETICCTPCT" in the last 72 hours. Sepsis Labs: Recent Labs  Lab 01/03/22 1814 01/07/22 0538 01/08/22 0421  PROCALCITON  --  8.66 6.11  LATICACIDVEN 0.9  --   --     Recent Results (from the past 240 hour(s))  Urine Culture     Status: None   Collection Time: 01/05/22  8:56 AM   Specimen:  Urine, Suprapubic  Result Value Ref Range Status   Specimen Description   Final    URINE, SUPRAPUBIC Performed at [facility], [address], [place], [place] [address]    Special Requests   Final    NONE Performed at [facility], [address], [place], [place] [address]    Culture   Final    NO GROWTH Performed at [facility], [place] [address] [place], [place] [address]    Report Status 01/06/2022 FINAL  Final         Radiology Studies: DG Abd 2 Views  Result Date: 01/07/2022 CLINICAL DATA:  Ileus versus small-bowel obstruction EXAM: ABDOMEN - 2 VIEW COMPARISON:  Portable  exam 0927 hours compared to 01/06/2022 FINDINGS: Nasogastric tube coiled in proximal stomach. Persistent small bowel dilatation. Relatively decreased gas and stool in colon though stool is present in rectum. No bowel wall thickening or free air. Diffuse osseous demineralization. Bibasilar atelectasis. IMPRESSION: Persistent small bowel dilatation with relative paucity of gas/stool in colon, favor small-bowel obstruction, little changed. Electronically Signed   By: Lavonia Dana M.D.   On: 01/07/2022 09:46        Scheduled Meds:  metoprolol tartrate  5 mg Intravenous Q8H   polyethylene glycol  17 g Oral Daily   sodium chloride flush  3 mL Intravenous Q12H   Continuous Infusions:  sodium chloride     ceFEPime (MAXIPIME) IV Stopped (01/09/22 0016)   sodium bicarbonate 150 mEq in dextrose 5 % 1,150 mL infusion 75 mL/hr at 01/08/22 0823     LOS: 5 days    Time spent: 33 mins     Wyvonnia Dusky, MD [facility] Pager [phone]  If 7PM-7AM, please contact night-coverage [email or web address] 01/09/2022, 8:21 AM

## 2022-01-09 NOTE — Progress Notes (Signed)
Patient ID: Troy Beals., male   DOB: Jan 28, 1946, 76 y.o.   MRN: [id number]     [facility] Day(s): 5.   Interval History: Patient seen and examined, no acute events or new complaints overnight.  As per nurse there has been no bowel movement yet.  No new complaints or any issues overnight.  NGT is in place.  Vital signs in last 24 hours: [min-max] current  Temp:  [98.2 F (36.8 C)-98.5 F (36.9 C)] 98.3 F (36.8 C) (10/05 0739) Pulse Rate:  [61-77] 77 (10/05 0739) Resp:  [17] 17 (10/05 0739) BP: (123-136)/(54-66) 131/66 (10/05 0739) SpO2:  [94 %-100 %] 99 % (10/05 0739) Weight:  [68.1 kg] 68.1 kg (10/05 0500)     Height: '5\' 9"'$  (175.3 cm) Weight: 68.1 kg BMI (Calculated): 22.16   Physical Exam:  Constitutional: alert, cooperative and no distress  Respiratory: breathing non-labored at rest  Cardiovascular: regular rate and sinus rhythm  Gastrointestinal: soft, non-tender, and mild-distended  Labs:     Latest Ref Rng & Units 01/09/2022    6:05 AM 01/08/2022    4:21 AM 01/06/2022    5:23 AM  CBC  WBC 4.0 - 10.5 K/uL 16.5  12.2  10.3   Hemoglobin 13.0 - 17.0 g/dL 7.6  8.3  8.6   Hematocrit 39.0 - 52.0 % 23.4  25.5  25.6   Platelets 150 - 400 K/uL 261  293  288       Latest Ref Rng & Units 01/09/2022    6:05 AM 01/08/2022    4:21 AM 01/07/2022    5:38 AM  CMP  Glucose 70 - 99 mg/dL 103  167  111   BUN 8 - 23 mg/dL 94  96  94   Creatinine 0.61 - 1.24 mg/dL 5.21  5.15  5.08   Sodium 135 - 145 mmol/L 145  142  140   Potassium 3.5 - 5.1 mmol/L 3.1  3.5  3.1   Chloride 98 - 111 mmol/L 100  104  104   CO2 22 - 32 mmol/L 35  28  23   Calcium 8.9 - 10.3 mg/dL 9.7  9.5  9.7     Imaging studies: No new pertinent imaging studies   Assessment/Plan:  76 y.o. male with small bowel obstruction 2 Day Post-Op s/p reduction of internal hernia.   -Patient today found with adequate vital signs.  No fever -No significant complaint -Abdominal exams still with  mild abdominal distention.  We will continue with NGT in place until we have good return of bowel function -Patient to get physical therapy as tolerated -We will continue to follow closely. -Continue with increasing creatinine.  Agree with nephrology consultation.  Arnold Long, MD

## 2022-01-09 NOTE — Progress Notes (Signed)
Dr. Windell Moment notified that pt has pulled out his NG tube despite having mittens on and sitter at bedside.  Sitter had just turned around to throw away something and when she turned back around the NG was out.  Abd is soft, rounded and bowel sounds positive, but no BM as of yet.  Per MD, will hold off on replacing NG for now. Ayesha Mohair BSN RN CMSRN 01/09/2022, 7:52 PM

## 2022-01-09 NOTE — Progress Notes (Signed)
[facility]  ROUNDING NOTE   Subjective:   Patient seen sitting up in bed, safety sitter at bedside NG tube remains in place Foley catheter with dark urine.  Bloody drainage reported around suprapubic catheter site. Remains n.p.o.  Objective:  Vital signs in last 24 hours:  Temp:  [98.2 F (36.8 C)-98.5 F (36.9 C)] 98.3 F (36.8 C) (10/05 0739) Pulse Rate:  [61-77] 77 (10/05 0739) Resp:  [17] 17 (10/05 0739) BP: (123-135)/(54-66) 131/66 (10/05 0739) SpO2:  [94 %-100 %] 99 % (10/05 0739) Weight:  [68.1 kg] 68.1 kg (10/05 0500)  Weight change:  Filed Weights   01/04/22 0408 01/05/22 0421 01/09/22 0500  Weight: 72.8 kg 72.4 kg 68.1 kg    Intake/Output: I/O last 3 completed shifts: In: 1508.1 [I.V.:1317.9; NG/GT:90; IV Piggyback:100.2] Out: 800 [Urine:800]   Intake/Output this shift:  Total I/O In: -  Out: 600 [Urine:600]  Physical Exam: General: NAD,   Head: Normocephalic, atraumatic.  NG tube in place  Eyes: Anicteric  Lungs:  Clear to auscultation  Heart: Regular rate and rhythm  Abdomen:  Soft, nontender, suprapubic catheter in place  Extremities:  1+ peripheral edema.  Neurologic: Nonfocal, moving all four extremities  Skin: No lesions  Access: None  GU-Foley catheter  Basic Metabolic Panel: Recent Labs  Lab 01/04/22 0731 01/05/22 0521 01/06/22 0523 01/07/22 0538 01/08/22 0421 01/09/22 0605  NA 130* 134* 134* 140 142 145  K 3.4* 3.5 2.8* 3.1* 3.5 3.1*  CL 104 106 104 104 104 100  CO2 16* 17* 21* 23 28 35*  GLUCOSE 102* 76 103* 111* 167* 103*  BUN 99* 97* 95* 94* 96* 94*  CREATININE 4.97* 4.96* 4.97* 5.08* 5.15* 5.21*  CALCIUM 9.7 10.1 9.8 9.7 9.5 9.7  MG 2.2 2.3 2.2 2.4  --   --   PHOS 4.7*  --   --   --   --   --     Liver Function Tests: Recent Labs  Lab 01/03/22 1252 01/04/22 0731  AST 13*  --   ALT 12  --   ALKPHOS 46  --   BILITOT 0.7  --   PROT 6.7  --   ALBUMIN 3.1* 2.6*   No results for input(s): "LIPASE",  "AMYLASE" in the last 168 hours. No results for input(s): "AMMONIA" in the last 168 hours.  CBC: Recent Labs  Lab 01/04/22 0731 01/05/22 0521 01/06/22 0523 01/08/22 0421 01/09/22 0605  WBC 7.9 10.1 10.3 12.2* 16.5*  HGB 7.0* 9.1* 8.6* 8.3* 7.6*  HCT 21.4* 26.9* 25.6* 25.5* 23.4*  MCV 79.3* 78.9* 78.8* 82.0 83.3  PLT 240 274 288 293 261    Cardiac Enzymes: No results for input(s): "CKTOTAL", "CKMB", "CKMBINDEX", "TROPONINI" in the last 168 hours.  BNP: Invalid input(s): "POCBNP"  CBG: Recent Labs  Lab 01/04/22 2012  GLUCAP 67    Microbiology: Results for orders placed or performed during the hospital encounter of 01/03/22  Urine Culture     Status: None   Collection Time: 01/05/22  8:56 AM   Specimen: Urine, Suprapubic  Result Value Ref Range Status   Specimen Description   Final    URINE, SUPRAPUBIC Performed at [facility], [address] [place], [place] [address]    Special Requests   Final    NONE Performed at [facility], [address], [place], [place] [address]    Culture   Final    NO GROWTH Performed at [facility], [place]  [address], [place], [place] [address]    Report Status 01/06/2022 FINAL  Final    Coagulation Studies: No results for input(s): "LABPROT", "INR" in the last 72 hours.  Urinalysis: No results for input(s): "COLORURINE", "LABSPEC", "PHURINE", "GLUCOSEU", "HGBUR", "BILIRUBINUR", "KETONESUR", "PROTEINUR", "UROBILINOGEN", "NITRITE", "LEUKOCYTESUR" in the last 72 hours.  Invalid input(s): "APPERANCEUR"    Imaging: No results found.   Medications:    sodium chloride     ceFEPime (MAXIPIME) IV Stopped (01/09/22 0016)   sodium bicarbonate 150 mEq in dextrose 5 % 1,150 mL infusion 75 mL/hr at 01/08/22 8675    metoprolol tartrate  5 mg Intravenous Q8H   polyethylene glycol  17 g Oral Daily   sodium chloride flush  3 mL Intravenous Q12H   sodium chloride, acetaminophen **OR** acetaminophen,  haloperidol lactate, morphine injection, sodium chloride flush  Assessment/ Plan:  [name] is a 76 y.o.  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 Urine output is low.  Serum creatinine is worsening. No uremic symptoms at present. Renal function continues to worsens  Urine output remains acceptable. Will monitor labs in the am.   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   Small bowel obstruction Status post exploratory laparotomy on 01/07/2022   LOS: 5   10/5/202312:17 PM

## 2022-01-10 ENCOUNTER — Other Ambulatory Visit: Payer: Self-pay

## 2022-01-10 ENCOUNTER — Inpatient Hospital Stay: Payer: Medicare HMO

## 2022-01-10 ENCOUNTER — Encounter: Payer: Self-pay | Admitting: Internal Medicine

## 2022-01-10 ENCOUNTER — Inpatient Hospital Stay: Payer: Medicare HMO | Admitting: General Practice

## 2022-01-10 ENCOUNTER — Encounter
Admission: EM | Disposition: A | Discharge status: Skilled Nursing Facility | Payer: Self-pay | Source: Home / Self Care | Attending: Internal Medicine

## 2022-01-10 DIAGNOSIS — F0390 Unspecified dementia without behavioral disturbance: Secondary | ICD-10-CM | POA: Diagnosis not present

## 2022-01-10 DIAGNOSIS — K56609 Unspecified intestinal obstruction, unspecified as to partial versus complete obstruction: Secondary | ICD-10-CM | POA: Diagnosis not present

## 2022-01-10 DIAGNOSIS — N184 Chronic kidney disease, stage 4 (severe): Secondary | ICD-10-CM | POA: Diagnosis not present

## 2022-01-10 DIAGNOSIS — N179 Acute kidney failure, unspecified: Secondary | ICD-10-CM | POA: Diagnosis not present

## 2022-01-10 HISTORY — PX: LAPAROTOMY: SHX154

## 2022-01-10 LAB — BASIC METABOLIC PANEL
Anion gap: 10 (ref 5–15)
BUN: 88 mg/dL — ABNORMAL HIGH (ref 8–23)
CO2: 37 mmol/L — ABNORMAL HIGH (ref 22–32)
Calcium: 9.7 mg/dL (ref 8.9–10.3)
Chloride: 102 mmol/L (ref 98–111)
Creatinine, Ser: 5.09 mg/dL — ABNORMAL HIGH (ref 0.61–1.24)
GFR, Estimated: 11 mL/min — ABNORMAL LOW (ref >60)
Glucose, Bld: 76 mg/dL (ref 70–99)
Potassium: 3.1 mmol/L — ABNORMAL LOW (ref 3.5–5.1)
Sodium: 149 mmol/L — ABNORMAL HIGH (ref 135–145)

## 2022-01-10 LAB — CBC
HCT: 25.6 % — ABNORMAL LOW (ref 39.0–52.0)
Hemoglobin: 8.1 g/dL — ABNORMAL LOW (ref 13.0–17.0)
MCH: 27.1 pg (ref 26.0–34.0)
MCHC: 31.6 g/dL (ref 30.0–36.0)
MCV: 85.6 fL (ref 80.0–100.0)
Platelets: 255 10*3/uL (ref 150–400)
RBC: 2.99 MIL/uL — ABNORMAL LOW (ref 4.22–5.81)
RDW: 15.8 % — ABNORMAL HIGH (ref 11.5–15.5)
WBC: 15 10*3/uL — ABNORMAL HIGH (ref 4.0–10.5)
nRBC: 0 % (ref 0.0–0.2)

## 2022-01-10 SURGERY — LAPAROTOMY, EXPLORATORY
Anesthesia: General

## 2022-01-10 MED ORDER — OXYCODONE HCL 5 MG/5ML PO SOLN: 5.0000 mg | Freq: Once | ORAL | Status: DC | PRN

## 2022-01-10 MED ORDER — FENTANYL CITRATE (PF) 100 MCG/2ML IJ SOLN
INTRAMUSCULAR | Status: AC
  Filled 2022-01-10: qty 2

## 2022-01-10 MED ORDER — DEXTROSE 5 % IV SOLN: INTRAVENOUS | Status: AC

## 2022-01-10 MED ORDER — DEXAMETHASONE SODIUM PHOSPHATE 10 MG/ML IJ SOLN
INTRAMUSCULAR | Status: AC
  Filled 2022-01-10: qty 1

## 2022-01-10 MED ORDER — SUGAMMADEX SODIUM 200 MG/2ML IV SOLN
INTRAVENOUS | Status: DC | PRN
  Administered 2022-01-10: 200 mg via INTRAVENOUS

## 2022-01-10 MED ORDER — OXYCODONE HCL 5 MG PO TABS: 5.0000 mg | ORAL_TABLET | Freq: Once | ORAL | Status: DC | PRN

## 2022-01-10 MED ORDER — BUPIVACAINE HCL (PF) 0.25 % IJ SOLN
INTRAMUSCULAR | Status: AC
  Filled 2022-01-10: qty 30

## 2022-01-10 MED ORDER — EPHEDRINE 5 MG/ML INJ
INTRAVENOUS | Status: AC
  Filled 2022-01-10: qty 5

## 2022-01-10 MED ORDER — BUPIVACAINE LIPOSOME 1.3 % IJ SUSP
INTRAMUSCULAR | Status: AC
  Filled 2022-01-10: qty 20

## 2022-01-10 MED ORDER — METOPROLOL TARTRATE 5 MG/5ML IV SOLN: 5.0000 mg | Freq: Three times a day (TID) | INTRAVENOUS | Status: DC | PRN

## 2022-01-10 MED ORDER — ONDANSETRON HCL 4 MG/2ML IJ SOLN
INTRAMUSCULAR | Status: DC | PRN
  Administered 2022-01-10: 4 mg via INTRAVENOUS

## 2022-01-10 MED ORDER — FENTANYL CITRATE (PF) 100 MCG/2ML IJ SOLN
INTRAMUSCULAR | Status: DC | PRN
  Administered 2022-01-10 (×2): 50 ug via INTRAVENOUS

## 2022-01-10 MED ORDER — PHENYLEPHRINE HCL-NACL 20-0.9 MG/250ML-% IV SOLN
INTRAVENOUS | Status: AC
  Filled 2022-01-10: qty 250

## 2022-01-10 MED ORDER — ROCURONIUM BROMIDE 10 MG/ML (PF) SYRINGE
PREFILLED_SYRINGE | INTRAVENOUS | Status: AC
  Filled 2022-01-10: qty 10

## 2022-01-10 MED ORDER — LIDOCAINE HCL (CARDIAC) PF 100 MG/5ML IV SOSY
PREFILLED_SYRINGE | INTRAVENOUS | Status: DC | PRN
  Administered 2022-01-10: 100 mg via INTRAVENOUS

## 2022-01-10 MED ORDER — PHENYLEPHRINE 80 MCG/ML (10ML) SYRINGE FOR IV PUSH (FOR BLOOD PRESSURE SUPPORT)
PREFILLED_SYRINGE | INTRAVENOUS | Status: AC
  Filled 2022-01-10: qty 20

## 2022-01-10 MED ORDER — FENTANYL CITRATE (PF) 100 MCG/2ML IJ SOLN: 25.0000 ug | INTRAMUSCULAR | Status: DC | PRN

## 2022-01-10 MED ORDER — ACETAMINOPHEN 10 MG/ML IV SOLN
INTRAVENOUS | Status: DC | PRN
  Administered 2022-01-10: 1000 mg via INTRAVENOUS

## 2022-01-10 MED ORDER — CARVEDILOL 25 MG PO TABS
25.0000 mg | ORAL_TABLET | Freq: Two times a day (BID) | ORAL | Status: DC
  Administered 2022-01-11 – 2022-01-14 (×5): 25 mg via ORAL
  Filled 2022-01-10 (×6): qty 1

## 2022-01-10 MED ORDER — SUCCINYLCHOLINE CHLORIDE 200 MG/10ML IV SOSY
PREFILLED_SYRINGE | INTRAVENOUS | Status: AC
  Filled 2022-01-10: qty 10

## 2022-01-10 MED ORDER — SUCCINYLCHOLINE CHLORIDE 200 MG/10ML IV SOSY
PREFILLED_SYRINGE | INTRAVENOUS | Status: DC | PRN
  Administered 2022-01-10: 100 mg via INTRAVENOUS

## 2022-01-10 MED ORDER — PHENYLEPHRINE HCL-NACL 20-0.9 MG/250ML-% IV SOLN
INTRAVENOUS | Status: DC | PRN
  Administered 2022-01-10: 50 ug/min via INTRAVENOUS

## 2022-01-10 MED ORDER — CEFAZOLIN SODIUM-DEXTROSE 2-4 GM/100ML-% IV SOLN
INTRAVENOUS | Status: AC
  Filled 2022-01-10: qty 100

## 2022-01-10 MED ORDER — PHENYLEPHRINE HCL (PRESSORS) 10 MG/ML IV SOLN
INTRAVENOUS | Status: DC | PRN
  Administered 2022-01-10: 160 ug via INTRAVENOUS
  Administered 2022-01-10 (×2): 240 ug via INTRAVENOUS
  Administered 2022-01-10: 80 ug via INTRAVENOUS
  Administered 2022-01-10: 160 ug via INTRAVENOUS

## 2022-01-10 MED ORDER — EPHEDRINE SULFATE (PRESSORS) 50 MG/ML IJ SOLN
INTRAMUSCULAR | Status: DC | PRN
  Administered 2022-01-10: 5 mg via INTRAVENOUS
  Administered 2022-01-10 (×2): 10 mg via INTRAVENOUS

## 2022-01-10 MED ORDER — CEFAZOLIN SODIUM-DEXTROSE 2-3 GM-%(50ML) IV SOLR
INTRAVENOUS | Status: DC | PRN
  Administered 2022-01-10: 2 g via INTRAVENOUS

## 2022-01-10 MED ORDER — BOOST / RESOURCE BREEZE PO LIQD CUSTOM
1.0000 | Freq: Three times a day (TID) | ORAL | Status: DC
  Administered 2022-01-10 – 2022-01-12 (×6): 1 via ORAL

## 2022-01-10 MED ORDER — ONDANSETRON HCL 4 MG/2ML IJ SOLN
INTRAMUSCULAR | Status: AC
  Filled 2022-01-10: qty 2

## 2022-01-10 MED ORDER — DEXAMETHASONE SODIUM PHOSPHATE 10 MG/ML IJ SOLN
INTRAMUSCULAR | Status: DC | PRN
  Administered 2022-01-10: 10 mg via INTRAVENOUS

## 2022-01-10 MED ORDER — ROCURONIUM BROMIDE 100 MG/10ML IV SOLN
INTRAVENOUS | Status: DC | PRN
  Administered 2022-01-10: 30 mg via INTRAVENOUS
  Administered 2022-01-10: 20 mg via INTRAVENOUS

## 2022-01-10 MED ORDER — LACTATED RINGERS IV SOLN: INTRAVENOUS | Status: DC | PRN

## 2022-01-10 MED ORDER — ACETAMINOPHEN 10 MG/ML IV SOLN
INTRAVENOUS | Status: AC
  Filled 2022-01-10: qty 100

## 2022-01-10 MED ORDER — ADULT MULTIVITAMIN W/MINERALS CH
1.0000 | ORAL_TABLET | Freq: Every day | ORAL | Status: DC
  Administered 2022-01-11 – 2022-01-12 (×2): 1 via ORAL
  Filled 2022-01-10 (×3): qty 1

## 2022-01-10 MED ORDER — BUPIVACAINE-EPINEPHRINE (PF) 0.5% -1:200000 IJ SOLN
INTRAMUSCULAR | Status: AC
  Filled 2022-01-10: qty 30

## 2022-01-10 MED ORDER — PROPOFOL 10 MG/ML IV BOLUS
INTRAVENOUS | Status: DC | PRN
  Administered 2022-01-10: 150 mg via INTRAVENOUS

## 2022-01-10 SURGICAL SUPPLY — 45 items
CHLORAPREP W/TINT 26 (MISCELLANEOUS) ×1 IMPLANT
DRAPE LAPAROTOMY 100X77 ABD (DRAPES) ×1 IMPLANT
DRSG OPSITE POSTOP 4X10 (GAUZE/BANDAGES/DRESSINGS) ×1 IMPLANT
DRSG OPSITE POSTOP 4X8 (GAUZE/BANDAGES/DRESSINGS) ×1 IMPLANT
ELECT BLADE 6.5 EXT (BLADE) ×1 IMPLANT
ELECT CAUTERY BLADE 6.4 (BLADE) ×1 IMPLANT
ELECT REM PT RETURN 9FT ADLT (ELECTROSURGICAL) ×1
ELECTRODE REM PT RTRN 9FT ADLT (ELECTROSURGICAL) ×1 IMPLANT
GAUZE 4X4 16PLY ~~LOC~~+RFID DBL (SPONGE) ×1 IMPLANT
GAUZE SPONGE 4X4 12PLY STRL (GAUZE/BANDAGES/DRESSINGS) IMPLANT
GLOVE BIO SURGEON STRL SZ 6.5 (GLOVE) ×1 IMPLANT
GLOVE BIOGEL PI IND STRL 6.5 (GLOVE) ×1 IMPLANT
GOWN STRL REUS W/ TWL LRG LVL3 (GOWN DISPOSABLE) ×2 IMPLANT
GOWN STRL REUS W/TWL LRG LVL3 (GOWN DISPOSABLE) ×2
KIT TURNOVER KIT A (KITS) ×1 IMPLANT
LABEL OR SOLS (LABEL) ×1 IMPLANT
LIGASURE IMPACT 36 18CM CVD LR (INSTRUMENTS) IMPLANT
MANIFOLD NEPTUNE II (INSTRUMENTS) ×1 IMPLANT
NEEDLE HYPO 22GX1.5 SAFETY (NEEDLE) ×1 IMPLANT
NS IRRIG 1000ML POUR BTL (IV SOLUTION) ×1 IMPLANT
PACK BASIN MAJOR ARMC (MISCELLANEOUS) ×1 IMPLANT
PACK COLON CLEAN CLOSURE (MISCELLANEOUS) ×1 IMPLANT
RELOAD LINEAR CUT PROX 55 BLUE (ENDOMECHANICALS) IMPLANT
RELOAD PROXIMATE 75MM BLUE (ENDOMECHANICALS) IMPLANT
RELOAD STAPLE 55 3.8 BLU REG (ENDOMECHANICALS) IMPLANT
RELOAD STAPLE 75 3.8 BLU REG (ENDOMECHANICALS) IMPLANT
SOL PREP POV-IOD 4OZ 10% (MISCELLANEOUS) IMPLANT
SPONGE T-LAP 18X18 ~~LOC~~+RFID (SPONGE) ×4 IMPLANT
STAPLER GUN LINEAR PROX 60 (STAPLE) IMPLANT
STAPLER PROXIMATE 55 BLUE (STAPLE) IMPLANT
STAPLER PROXIMATE 75MM BLUE (STAPLE) IMPLANT
STAPLER SKIN PROX 35W (STAPLE) ×1 IMPLANT
SUT ETHIBOND 0 MO6 C/R (SUTURE) IMPLANT
SUT SILK 2 0 (SUTURE) ×1
SUT SILK 2-0 18XBRD TIE 12 (SUTURE) ×1 IMPLANT
SUT SILK 3 0 (SUTURE) ×1
SUT SILK 3-0 (SUTURE) IMPLANT
SUT SILK 3-0 18XBRD TIE 12 (SUTURE) ×1 IMPLANT
SUT STRATAFIX PDS 30 CT-1 (SUTURE) ×2 IMPLANT
SUT VIC AB 3-0 SH 27 (SUTURE) ×2
SUT VIC AB 3-0 SH 27X BRD (SUTURE) ×2 IMPLANT
SYR 20ML LL LF (SYRINGE) ×1 IMPLANT
TRAP FLUID SMOKE EVACUATOR (MISCELLANEOUS) ×1 IMPLANT
TRAY FOLEY MTR SLVR 16FR STAT (SET/KITS/TRAYS/PACK) ×1 IMPLANT
WATER STERILE IRR 500ML POUR (IV SOLUTION) ×1 IMPLANT

## 2022-01-10 NOTE — Evaluation (Signed)
Occupational Therapy Evaluation Patient Details Name: Troy Berry. MRN: [id number] DOB: Aug 07, 1945 Today's Date: 01/10/2022   History of Present Illness Patient is a 76 y.o.  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 He was admitted on 01/03/2022 for Acute on chronic renal failure, small bowel obstruction  s/p reduction of internal hernia   Clinical Impression   Patient presenting with decreased Ind in self care, balance, functional mobility/transfers, endurance, and safety awareness. Patient verbalizes his name and one word responses with his wife mostly answering questions about home and PLOF. She reports pt needing assistance for self care tasks and she performs all IADLs. He does ambulate at mod I level with RW. She reports pt needing cuing for sequencing and initiation to sponge bath with her assisting for thoroughness. Patient currently functioning at max A for sit <>stand and steps to sink. Pt unable to follow any commands at sink and then began to try to sit while standing there with therapist moving bed to pt for safety. Sitter returning to room at end of session. Patient will benefit from acute OT to increase overall independence in the areas of ADLs, functional mobility, and safety awareness in order to safely discharge to next venue of care.      Recommendations for follow up therapy are one component of a multi-disciplinary discharge planning process, led by the attending physician.  Recommendations may be updated based on patient status, additional functional criteria and insurance authorization.   Follow Up Recommendations  Skilled nursing-short term rehab (<3 hours/day)    Assistance Recommended at Discharge Frequent or constant Supervision/Assistance  Patient can return home with the following A lot of help with walking and/or transfers;A lot of help with  bathing/dressing/bathroom;Assistance with feeding;Assistance with cooking/housework;Direct supervision/assist for medications management;Direct supervision/assist for financial management;Assist for transportation    Functional Status Assessment  Patient has had a recent decline in their functional status and demonstrates the ability to make significant improvements in function in a reasonable and predictable amount of time.  Equipment Recommendations  Other (comment) (defer to next venue of care)       Precautions / Restrictions Precautions Precautions: Fall Restrictions Weight Bearing Restrictions: No      Mobility Bed Mobility Overal bed mobility: Needs Assistance Bed Mobility: Supine to Sit, Sit to Supine     Supine to sit: Max assist Sit to supine: Max assist   General bed mobility comments: assistance for LE and trunk support. verbal cues for technique with increased time and effort required    Transfers Overall transfer level: Needs assistance Equipment used: Rolling walker (2 wheels) Transfers: Sit to/from Stand, Bed to chair/wheelchair/BSC Sit to Stand: Max assist     Step pivot transfers: Min assist     General transfer comment: but pt unable to safely following cuing with standing and steps requiring therapist to move bed behind pt for safety      Balance Overall balance assessment: Needs assistance Sitting-balance support: Feet supported Sitting balance-[name]: Fair Sitting balance - Comments: no loss of balance. close stand by assistance provided for safety Postural control: Posterior lean Standing balance support: Single extremity supported, Reliant on assistive device for balance Standing balance-[name]: Zero                             ADL either performed or assessed with clinical judgement   ADL Overall ADL's :  Needs assistance/impaired                                       General ADL Comments: attempted  grooming tasks at sink but pt unable to follows commands and attempting to sit when he was not near bed/chair requring increased assistance for safety     Vision Patient Visual Report: No change from baseline              Pertinent Vitals/Pain Pain Assessment Pain Assessment: Faces Faces Pain Scale: Hurts a little bit Pain Location: bilateral feet with standing Pain Descriptors / Indicators: Grimacing Pain Intervention(s): Limited activity within patient's tolerance, Monitored during session        Extremity/Trunk Assessment Upper Extremity Assessment Upper Extremity Assessment: Generalized weakness;Difficult to assess due to impaired cognition   Lower Extremity Assessment Lower Extremity Assessment: Generalized weakness;Difficult to assess due to impaired cognition       Communication Communication Communication: HOH   Cognition Arousal/Alertness: Awake/alert Behavior During Therapy: WFL for tasks assessed/performed Overall Cognitive Status: History of cognitive impairments - at baseline                                 General Comments: Pt's wife reports that she has to assist him and gives cues for self care tasks for initiation and sequencing but pt's cognition is likely worse than baseline as he currently needs 1:1 sitter and B mitts in place for safety                Home Living Family/patient expects to be discharged to:: Private residence Living Arrangements: Spouse/significant other Available Help at Discharge: Family;Available 24 hours/day Type of Home: House Home Access: Level entry     Home Layout: One level     Bathroom Shower/Tub: Occupational psychologist: Standard     Home Equipment: Conservation officer, nature (2 wheels);BSC/3in1          Prior Functioning/Environment Prior Level of Function : Needs assist             Mobility Comments: independent with rolling walker for ambulation at baseline ADLs Comments: Per spouse  in room, pt getting sponge bath in bed and needs assistance with self care tasks. Wife performs all IADLs. She has to cue him for sequencing and initiation at baseline.        OT Problem List: Decreased strength;Decreased activity tolerance;Impaired balance (sitting and/or standing);Decreased knowledge of use of DME or AE;Decreased safety awareness;Decreased range of motion;Decreased cognition      OT Treatment/Interventions: Self-care/ADL training;Therapeutic exercise;Therapeutic activities;Energy conservation;Manual therapy;Balance training;Patient/family education;DME and/or AE instruction    OT Goals(Current goals can be found in the care plan section) Acute Rehab OT Goals Patient Stated Goal: to get stronger OT Goal Formulation: With family Time For Goal Achievement: 01/24/22 Potential to Achieve Goals: Good ADL Goals Pt Will Perform Grooming: with supervision;standing Pt Will Perform Lower Body Dressing: with min guard assist;sit to/from stand Pt Will Transfer to Toilet: ambulating;with supervision Pt Will Perform Toileting - Clothing Manipulation and hygiene: with min guard assist;sit to/from stand  OT Frequency: Min 2X/week       AM-PAC OT "6 Clicks" Daily Activity     Outcome Measure Help from another person eating meals?: A Little Help from another person taking care of personal grooming?: A Little Help from  another person toileting, which includes using toliet, bedpan, or urinal?: A Lot Help from another person bathing (including washing, rinsing, drying)?: A Lot Help from another person to put on and taking off regular upper body clothing?: A Little Help from another person to put on and taking off regular lower body clothing?: A Lot 6 Click Score: 15   End of Session Equipment Utilized During Treatment: Rolling walker (2 wheels) Nurse Communication: Mobility status  Activity Tolerance: Patient tolerated treatment well Patient left: in bed;with call bell/phone within  reach;with bed alarm set  OT Visit Diagnosis: Unsteadiness on feet (R26.81);Repeated falls (R29.6);Muscle weakness (generalized) (M62.81)                Time: 0923-3007 OT Time Calculation (min): 20 min Charges:  OT General Charges $OT Visit: 1 Visit OT Evaluation $OT Eval Moderate Complexity: 1 Mod OT Treatments $Self Care/Home Management : 8-22 mins  Darleen Crocker, MS, OTR/L , CBIS ascom [phone]  01/10/22, 11:34 AM

## 2022-01-10 NOTE — TOC Progression Note (Signed)
Transition of Care (TOC) - Progression Note    Patient Details  Name: Troy Berry. MRN: [id number] Date of Birth: 1945-12-04  Transition of Care [facility]) CM/SW Contact  Candie Chroman, LCSW Phone Number: 01/10/2022, 1:19 PM  Clinical Narrative: Wife agreeable to SNF placement. Provided CMS scores for facilities within 25 miles of their zip code. Will follow up once bed offers available.    Expected Discharge Plan:  (TBD) Barriers to Discharge: Continued Medical Work up  Expected Discharge Plan and Services Expected Discharge Plan:  (TBD)     Post Acute Care Choice:  (TBD) Living arrangements for the past 2 months: Single Family Home                                       Social Determinants of Health (SDOH) Interventions Transportation Interventions: Inpatient TOC, Other (Comment) (Transportation provided through [employer])  Readmission Risk Interventions     No data to display

## 2022-01-10 NOTE — Care Management Important Message (Signed)
Important Message  Patient Details  Name: Troy Berry. MRN: [id number] Date of Birth: 10/17/1945   Medicare Important Message Given:  Yes     Dannette Barbara 01/10/2022, 12:01 PM

## 2022-01-10 NOTE — Op Note (Signed)
Preoperative diagnosis: Wound dehiscence   Postoperative diagnosis: Same  Procedure: Re-opening of recent laparotomy   Anesthesia: GETA  Surgeon: Dr. Windell Moment, MD  Wound Classification: Clean   Indications: Patient is a 76 y.o. male with history of exploratory laparotomy 3 days ago with acute onset of abundant serous drainage through the wound and clinical wound dehiscence.  Taken to the OR for reopening of recent laparotomy and closure of abdominal wound.  Findings: 1.  Bladder was very distended despite having suprapubic catheter 2.  The bladder was flushed with 30 cc of saline and abundant amount of clots were suctioned. 3.  About 200 cc of bloody urine was aspirated from the bladder until it completely got collapse. 4.  No sign of bowel obstruction 5.  No recurrent internal hernia. 6.  Adequate hemostasis  Description of procedure: The patient was placed in the supine position and general endotracheal anesthesia was induced. A timeout was completed verifying correct patient, procedure, site, positioning, and implant(s) and/or special equipment prior to beginning this procedure. Preoperative antibiotics were given. A suprapubic catheter was already in place. The abdomen was prepped and draped in the usual sterile fashion. After a timeout was performed, the previous incision was opened.  The STRATAFIX was removed and the peritoneal cavity entered. The abdomen was explored and serous fluid was identified.  Bowel was without intestinal dilation.  The small bowel was completely inspected from [place] to elicit the bowel and there was no recurrent obstruction.   The urinary bladder was very distended and tense.  The suprapubic catheter was flushed with 60 cc of saline and multiple clots were aspirated.  Aspiration of the bladder done on to complete collapse.  After the sponge needle and instrument count was correct, the fascia was closed with 2 running suture of STRATAFIX 0 and multiple  interrupted 0 Ethibond. The skin was closed with skin staples. The patient tolerated the procedure well and was taken to the postanesthesia care unit in stable condition.   Specimen: None   Complications: None  Estimated Blood Loss: Minimal

## 2022-01-10 NOTE — Progress Notes (Signed)
Patient found to have a large amount of drainage coming from his midline incision that had staples and a honey comb dressing applied. Honey comb dressing was soaked through with blood. Dr Windell Moment was paged and came promptly to the bedside. It was determined that this patient needed emergent surgery. At the time that Dr. Windell Moment determined that this patient needed emergent surgery, the wife was no longer at the bedside or in the hallway. The Sitter for this patient, the charge nurse and Dr. Windell Moment searched for the wife in order to get consent and were unable to find her. The patient was transported to the OR and consent will be signed as soon as the wife returns.

## 2022-01-10 NOTE — Anesthesia Postprocedure Evaluation (Signed)
Anesthesia Post Note  Patient: Troy Berry.  Procedure(s) Performed: EXPLORATORY LAPAROTOMY  Patient location during evaluation: PACU Anesthesia Type: General Level of consciousness: awake and alert Pain management: pain level controlled Vital Signs Assessment: post-procedure vital signs reviewed and stable Respiratory status: spontaneous breathing, nonlabored ventilation, respiratory function stable and patient connected to nasal cannula oxygen Cardiovascular status: blood pressure returned to baseline and stable Postop Assessment: no apparent nausea or vomiting Anesthetic complications: no   No notable events documented.   Last Vitals:  Vitals:   01/10/22 1830 01/10/22 1851  BP: 115/65 132/65  Pulse: 71 62  Resp: 16   Temp: (!) 36.3 C   SpO2: 94%     Last Pain:  Vitals:   01/10/22 1555  TempSrc: Temporal  PainSc:                  Dimas Millin

## 2022-01-10 NOTE — Progress Notes (Signed)
Initial Nutrition Assessment  DOCUMENTATION CODES:   Not applicable  INTERVENTION:   Recommend TPN initiation as pt without adequate nutrition for > 7 days.   Recommend thiamine '100mg'$  daily in TPN x 3 days  Pt at high refeed risk; recommend monitor potassium, magnesium and phosphorus labs daily until stable  Daily weights   NUTRITION DIAGNOSIS:   Inadequate oral intake related to acute illness as evidenced by NPO status.  GOAL:   Patient will meet greater than or equal to 90% of their needs  MONITOR:   Diet advancement, Labs, Weight trends, Skin, I & O's  REASON FOR ASSESSMENT:   NPO/Clear Liquid Diet    ASSESSMENT:   76 y/o male with h/o atonic bladder with chronic foley, CKD IV, dementia, HTN and gout who is admitted with SBO now s/p ex lap with reduction of internal hernia 10/3.  Unable to see pt as pt in the OR at time of RD visit. Pt has remained on NPO/clear liquid diet since admission and is now without adequate nutrition for > 7 days. Pt has returned to the OR today for wound dehiscence and closure of abdominal wall. Recommend TPN initiation. Pt is at high refeed risk. Per chart, pt is down ~10lbs since admission.   Medications reviewed and include: MVI, miralax, cefepime, 5% dextrose '@75ml'$ /hr  Labs reviewed: Na 149(H), K 3.1(L), BUN 88(H) Wbc- 15.0(H), Hgb 8.1(L), Hct 25.6(L)  NUTRITION - FOCUSED PHYSICAL EXAM: Unable to perform at this time   Diet Order:   Diet Order             Diet NPO time specified  Diet effective now                  EDUCATION NEEDS:   Not appropriate for education at this time  Skin:  Skin Assessment: Reviewed RN Assessment (incision abdomen)  Last BM:  pta  Height:   Ht Readings from Last 1 Encounters:  01/03/22 '5\' 9"'$  (1.753 m)    Weight:   Wt Readings from Last 1 Encounters:  01/09/22 68.1 kg    Ideal Body Weight:  72.7 kg  BMI:  Body mass index is 22.17 kg/m.  Estimated Nutritional Needs:   Kcal:   1800-2200kcal/day  Protein:  90-105g/day  Fluid:  1.7-2.0L/day  Koleen Distance MS, RD, LDN Please refer to [facility] for RD and/or RD on-call/weekend/after hours pager

## 2022-01-10 NOTE — Evaluation (Signed)
Physical Therapy Evaluation Patient Details Name: Troy Berry. MRN: [id number] DOB: 01-12-1946 Today's Date: 01/10/2022  History of Present Illness  Patient is a 76 y.o.  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 He was admitted on 01/03/2022 for Acute on chronic renal failure, small bowel obstruction  s/p reduction of internal hernia  Clinical Impression  Patient is confused but cooperative with no agitation during PT evaluation. Spouse in the room reports patient uses a rolling walker for ambulation at baseline and does not require physical assistance from her with mobility. She also reports patient will need to be as independent at possible before coming home and she is limited with being able to physically help.  Today, the patient required maximal assistance for bed mobility. Fair sitting balance. Max A required for sit to standing transfer with standing tolerance of less than one minute. Recommend PT follow up to maximize independence and decrease caregiver burden. SNF recommended for short term rehab at discharge at this time.      Recommendations for follow up therapy are one component of a multi-disciplinary discharge planning process, led by the attending physician.  Recommendations may be updated based on patient status, additional functional criteria and insurance authorization.  Follow Up Recommendations Skilled nursing-short term rehab (<3 hours/day) Can patient physically be transported by private vehicle: No    Assistance Recommended at Discharge Frequent or constant Supervision/Assistance  Patient can return home with the following  Two people to help with walking and/or transfers;A lot of help with bathing/dressing/bathroom;Assist for transportation    Equipment Recommendations None recommended by PT  Recommendations for Other Services       Functional Status Assessment  Patient has had a recent decline in their functional status and demonstrates the ability to make significant improvements in function in a reasonable and predictable amount of time.     Precautions / Restrictions Precautions Precautions: Fall Restrictions Weight Bearing Restrictions: No      Mobility  Bed Mobility Overal bed mobility: Needs Assistance Bed Mobility: Supine to Sit, Sit to Supine     Supine to sit: Max assist Sit to supine: Max assist   General bed mobility comments: assistance for LE and trunk support. verbal cues for technique with increased time and effort required    Transfers Overall transfer level: Needs assistance Equipment used: Rolling walker (2 wheels) Transfers: Sit to/from Stand Sit to Stand: Max assist           General transfer comment: significant assistance required for standing. verbal cues for technique, hand placement, anterior weight shifting    Ambulation/Gait               General Gait Details: unable to at this time  Stairs            Wheelchair Mobility    Modified Rankin (Stroke Patients Only)       Balance Overall balance assessment: Needs assistance Sitting-balance support: Feet supported Sitting balance-[name]: Fair Sitting balance - Comments: no loss of balance. close stand by assistance provided for safety Postural control: Posterior lean Standing balance support: Single extremity supported, Reliant on assistive device for balance Standing balance-[name]: Zero Standing balance comment: maximal assistance for required, posterior bias. standing tolerance less than 30 seconds                             Pertinent Vitals/Pain  Pain Assessment Pain Assessment: Faces Faces Pain Scale: Hurts a little bit Pain Location: bilateral feet with standing Pain Descriptors / Indicators: Grimacing Pain Intervention(s): Limited activity within patient's tolerance, Monitored during session     Home Living Family/patient expects to be discharged to:: Private residence Living Arrangements: Spouse/significant other Available Help at Discharge: Family Type of Home: House Home Access: Level entry       Home Layout: One level Home Equipment: Conservation officer, nature (2 wheels)      Prior Function Prior Level of Function : Independent/Modified Independent             Mobility Comments: independent with rolling walker for ambulation at baseline ADLs Comments: independent per spouse report     Hand Dominance        Extremity/Trunk Assessment   Upper Extremity Assessment Upper Extremity Assessment: Generalized weakness    Lower Extremity Assessment Lower Extremity Assessment: Generalized weakness;Difficult to assess due to impaired cognition       Communication   Communication: HOH  Cognition Arousal/Alertness: Awake/alert Behavior During Therapy: WFL for tasks assessed/performed Overall Cognitive Status: Impaired/Different from baseline Area of Impairment: Following commands, Problem solving                       Following Commands: Follows one step commands inconsistently (multi-modal cues required, increased time)     Problem Solving: Slow processing, Decreased initiation, Difficulty sequencing, Requires verbal cues, Requires tactile cues General Comments: patient is mostly non verbal. he says "huh?" when asked questions. he makes eye contact and participates with session. no agitation noted. bilateral mitts in place and 1:1 sitter in the room        General Comments      Exercises     Assessment/Plan    PT Assessment Patient needs continued PT services  PT Problem List Decreased strength;Decreased range of motion;Decreased activity tolerance;Decreased balance;Decreased mobility;Decreased cognition;Decreased knowledge of use of DME;Decreased knowledge of precautions;Decreased safety awareness       PT Treatment Interventions DME  instruction;Gait training;Stair training;Functional mobility training;Therapeutic activities;Therapeutic exercise;Balance training;Neuromuscular re-education;Cognitive remediation;Patient/family education    PT Goals (Current goals can be found in the Care Plan section)  Acute Rehab PT Goals Patient Stated Goal: spouse reports patient will need to be as independent as possible to return home PT Goal Formulation: With family Time For Goal Achievement: 01/24/22 Potential to Achieve Goals: Fair    Frequency Min 2X/week     Co-evaluation               AM-PAC PT "6 Clicks" Mobility  Outcome Measure Help needed turning from your back to your side while in a flat bed without using bedrails?: A Lot Help needed moving from lying on your back to sitting on the side of a flat bed without using bedrails?: A Lot Help needed moving to and from a bed to a chair (including a wheelchair)?: A Lot Help needed standing up from a chair using your arms (e.g., wheelchair or bedside chair)?: A Lot Help needed to walk in hospital room?: Total Help needed climbing 3-5 steps with a railing? : Total 6 Click Score: 10    End of Session   Activity Tolerance: Patient tolerated treatment well Patient left: in bed;with call bell/phone within reach;with bed alarm set;with SCD's reapplied;with family/visitor present;with nursing/sitter in room Nurse Communication: Mobility status PT Visit Diagnosis: Unsteadiness on feet (R26.81);Muscle weakness (generalized) (M62.81)    Time: 2836-6294 PT Time Calculation (min) (ACUTE ONLY): 14  min   Charges:   PT Evaluation $PT Eval Low Complexity: 1 Low          Minna Merritts, PT, MPT   Percell Locus 01/10/2022, 9:51 AM

## 2022-01-10 NOTE — Transfer of Care (Signed)
Immediate Anesthesia Transfer of Care Note  Patient: Troy Berry.  Procedure(s) Performed: EXPLORATORY LAPAROTOMY  Patient Location: PACU  Anesthesia Type:General  Level of Consciousness: drowsy  Airway & Oxygen Therapy: Patient Spontanous Breathing and Patient connected to face mask oxygen  Post-op Assessment: Report given to RN and Post -op Vital signs reviewed and stable  Post vital signs: Reviewed and stable  Last Vitals:  Vitals Value Taken Time  BP 127/56 01/10/22 1801  Temp    Pulse 61 01/10/22 1805  Resp 14 01/10/22 1805  SpO2 100 % 01/10/22 1805  Vitals shown include unvalidated device data.  Last Pain:  Vitals:   01/10/22 1555  TempSrc: Temporal  PainSc:          Complications: No notable events documented.

## 2022-01-10 NOTE — Progress Notes (Signed)
PROGRESS NOTE    Troy Berry.  GHW:[id number] DOB: 07/21/1945 DOA: 01/03/2022 PCP: [facility]   Assessment & Plan:   Principal Problem:   Acute renal failure superimposed on stage 4 chronic kidney disease ([place]) Active Problems:   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HCC)   SBO (small bowel obstruction) (HCC)   Anemia   Hematuria   Urinary tract infection   Metabolic acidosis   Essential hypertension   Gross hematuria   Dementia without behavioral disturbance (HCC)   Hypokalemia   Acute blood loss anemia  Assessment and Plan: AKI on CKDIV: Cr is trending down slightly today. Likely secondary to ATN due to concurrent illness & SBO as per nephro. Nephro following and recs apprec    Suprapubic catheter dysfunction: w/ hx of atonic bladder requiring suprapubic catheter placement approx on 12/05/21. On 12/30/2021, catheter was replaced by IR.  Since that time, patient has been experiencing difficulties including hematuria and poor urinary drainage.  He was reevaluated by urology and IR on 9/27 without any improvement.  On admission, it was discovered that suprapubic catheter tip was not in the bladder but adjacent to the small bowel. Suprapubic catheter replaced with cystogram demonstrating appropriate placement within bladder as per urology   SBO: CT renal study with evidence of some small bowel dilation with no focal point demonstrated.  CT cystogram demonstrates persistent small bowel dilation with evidence of focal transition and in the central mesentery.  Findings are consistent with small bowel obstruction. S/p ex lap & reduction of hernia on 01/07/22 as per gen surg. Pt pulled out the NG tube today. Started clear liquid diet as per speech    ACD: secondary to CKD and hematuria. H&H are labile    Hematuria: still present but improved. Will transfuse if Hb < 7.0    UTI: secondary to obstructive uropathy. Continue on IV cefepime x 10 days total.    Hyponatremia:  resolved  Hypernatremia: free water deficit is 2.2L. Started on V8L   Metabolic acidosis: resolved   HTN: continue on coreg. Holding lasix, amlodipine   Hypokalemia: will hold off on replacement as Cr is trending up daily    Dementia: w/o behavioral disturbance. Continue w/ supportive care       DVT prophylaxis: SCDs Code Status: full  Family Communication: discussed pt's care w/ pt's wife at bedside and answered her questions  Disposition Plan: depends on PT/OT recs  Level of care: Med-Surg  Status is: Inpatient  Remains inpatient appropriate because: severity of illness  Consultants:  Nephro General surg   Procedures:   Antimicrobials: cefepime   Subjective: Pt does not answer any of my questions   Objective: Vitals:   01/09/22 1344 01/09/22 1546 01/09/22 1938 01/10/22 0505  BP: (!) 132/54 (!) 134/59 (!) 132/58 (!) 156/58  Pulse: (!) 59 76 83 83  Resp:  '17 17 17  '$ Temp:  98.4 F (36.9 C) 98.7 F (37.1 C) 98.1 F (36.7 C)  TempSrc:      SpO2:  98% 98% 99%  Weight:      Height:        Intake/Output Summary (Last 24 hours) at 01/10/2022 0804 Last data filed at 01/10/2022 3810 Gross per 24 hour  Intake 1855.33 ml  Output 2020 ml  Net -164.67 ml   Filed Weights   01/04/22 0408 01/05/22 0421 01/09/22 0500  Weight: 72.8 kg 72.4 kg 68.1 kg    Examination:  General exam: Appears comfortable  Respiratory system: clear breath  sounds b/l  Cardiovascular system: S1 & S2+. No rubs or clicks  Gastrointestinal system: Abd is soft, NT, ND & hypoactive bowel sounds  Central nervous system: Alert and awake  Psychiatry: judgement and insight appears poor. Flat mood and affect     Data Reviewed: I have personally reviewed following labs and imaging studies  CBC: Recent Labs  Lab 01/05/22 0521 01/06/22 0523 01/08/22 0421 01/09/22 0605 01/10/22 0553  WBC 10.1 10.3 12.2* 16.5* 15.0*  HGB 9.1* 8.6* 8.3* 7.6* 8.1*  HCT 26.9* 25.6* 25.5* 23.4* 25.6*  MCV  78.9* 78.8* 82.0 83.3 85.6  PLT 274 288 293 261 258   Basic Metabolic Panel: Recent Labs  Lab 01/04/22 0731 01/05/22 0521 01/06/22 0523 01/07/22 0538 01/08/22 0421 01/09/22 0605 01/10/22 0553  NA 130* 134* 134* 140 142 145 149*  K 3.4* 3.5 2.8* 3.1* 3.5 3.1* 3.1*  CL 104 106 104 104 104 100 102  CO2 16* 17* 21* 23 28 35* 37*  GLUCOSE 102* 76 103* 111* 167* 103* 76  BUN 99* 97* 95* 94* 96* 94* 88*  CREATININE 4.97* 4.96* 4.97* 5.08* 5.15* 5.21* 5.09*  CALCIUM 9.7 10.1 9.8 9.7 9.5 9.7 9.7  MG 2.2 2.3 2.2 2.4  --   --   --   PHOS 4.7*  --   --   --   --   --   --    GFR: Estimated Creatinine Clearance: 11.7 mL/min (A) (by C-G formula based on SCr of 5.09 mg/dL (H)). Liver Function Tests: Recent Labs  Lab 01/03/22 1252 01/04/22 0731  AST 13*  --   ALT 12  --   ALKPHOS 46  --   BILITOT 0.7  --   PROT 6.7  --   ALBUMIN 3.1* 2.6*   No results for input(s): "LIPASE", "AMYLASE" in the last 168 hours. No results for input(s): "AMMONIA" in the last 168 hours. Coagulation Profile: No results for input(s): "INR", "PROTIME" in the last 168 hours. Cardiac Enzymes: No results for input(s): "CKTOTAL", "CKMB", "CKMBINDEX", "TROPONINI" in the last 168 hours. BNP (last 3 results) No results for input(s): "PROBNP" in the last 8760 hours. HbA1C: No results for input(s): "HGBA1C" in the last 72 hours. CBG: Recent Labs  Lab 01/04/22 2012  GLUCAP 89   Lipid Profile: No results for input(s): "CHOL", "HDL", "LDLCALC", "TRIG", "CHOLHDL", "LDLDIRECT" in the last 72 hours. Thyroid Function Tests: No results for input(s): "TSH", "T4TOTAL", "FREET4", "T3FREE", "THYROIDAB" in the last 72 hours. Anemia Panel: No results for input(s): "VITAMINB12", "FOLATE", "FERRITIN", "TIBC", "IRON", "RETICCTPCT" in the last 72 hours. Sepsis Labs: Recent Labs  Lab 01/03/22 1814 01/07/22 0538 01/08/22 0421  PROCALCITON  --  8.66 6.11  LATICACIDVEN 0.9  --   --     Recent Results (from the past 240  hour(s))  Urine Culture     Status: None   Collection Time: 01/05/22  8:56 AM   Specimen: Urine, Suprapubic  Result Value Ref Range Status   Specimen Description   Final    URINE, SUPRAPUBIC Performed at [facility], [address], [place], [place] [address]    Special Requests   Final    NONE Performed at [facility], [address], [place], [place] [address]    Culture   Final    NO GROWTH Performed at [facility], [place] [address] [place], [place] [address]    Report Status 01/06/2022 FINAL  Final  Radiology Studies: No results found.      Scheduled Meds:  metoprolol tartrate  5 mg Intravenous Q8H   polyethylene glycol  17 g Oral Daily   sodium chloride flush  3 mL Intravenous Q12H   Continuous Infusions:  sodium chloride     ceFEPime (MAXIPIME) IV Stopped (01/09/22 2332)   dextrose       LOS: 6 days    Time spent: 30 mins     Wyvonnia Dusky, MD [facility] Pager [phone]  If 7PM-7AM, please contact night-coverage [email or web address] 01/10/2022, 8:04 AM

## 2022-01-10 NOTE — Anesthesia Procedure Notes (Signed)
Procedure Name: Intubation Date/Time: 01/10/2022 4:36 PM  Performed by: Tollie Eth, CRNAPre-anesthesia Checklist: Patient identified, Patient being monitored, Timeout performed, Emergency Drugs available and Suction available Patient Re-evaluated:Patient Re-evaluated prior to induction Oxygen Delivery Method: Circle system utilized Preoxygenation: Pre-oxygenation with 100% oxygen Induction Type: IV induction and Rapid sequence Laryngoscope Size: McGraph and 4 Grade View: Grade I Tube type: Oral Tube size: 7.5 mm Number of attempts: 1 Airway Equipment and Method: Stylet Placement Confirmation: ETT inserted through vocal cords under direct vision, positive ETCO2 and breath sounds checked- equal and bilateral Secured at: 21 cm Tube secured with: Tape Dental Injury: Teeth and Oropharynx as per pre-operative assessment

## 2022-01-10 NOTE — Progress Notes (Signed)
Patient ID: Troy Beals., male   DOB: [birth year], 76 y.o.   MRN: [id number]     [facility] Day(s): 6.   Interval History: Patient seen and examined, no acute events or new complaints overnight.  Patient did remove his NGT last night.  Abdomen is significantly softer today.  Less distended.  Still unable to know if he is passing gas.  No sign of discomfort, there has been no nausea or vomiting.  Vital signs in last 24 hours: [min-max] current  Temp:  [98.1 F (36.7 C)-98.7 F (37.1 C)] 98.4 F (36.9 C) (10/06 0804) Pulse Rate:  [59-83] 65 (10/06 0804) Resp:  [17] 17 (10/06 0804) BP: (132-156)/(54-59) 155/54 (10/06 0804) SpO2:  [98 %-100 %] 100 % (10/06 0804)     Height: '5\' 9"'$  (175.3 cm) Weight: 68.1 kg BMI (Calculated): 22.16   Physical Exam:  Constitutional: alert, cooperative and no distress  Respiratory: breathing non-labored at rest  Cardiovascular: regular rate and sinus rhythm  Gastrointestinal: soft, non-tender, and non-distended  Labs:     Latest Ref Rng & Units 01/10/2022    5:53 AM 01/09/2022    6:05 AM 01/08/2022    4:21 AM  CBC  WBC 4.0 - 10.5 K/uL 15.0  16.5  12.2   Hemoglobin 13.0 - 17.0 g/dL 8.1  7.6  8.3   Hematocrit 39.0 - 52.0 % 25.6  23.4  25.5   Platelets 150 - 400 K/uL 255  261  293       Latest Ref Rng & Units 01/10/2022    5:53 AM 01/09/2022    6:05 AM 01/08/2022    4:21 AM  CMP  Glucose 70 - 99 mg/dL 76  103  167   BUN 8 - 23 mg/dL 88  94  96   Creatinine 0.61 - 1.24 mg/dL 5.09  5.21  5.15   Sodium 135 - 145 mmol/L 149  145  142   Potassium 3.5 - 5.1 mmol/L 3.1  3.1  3.5   Chloride 98 - 111 mmol/L 102  100  104   CO2 22 - 32 mmol/L 37  35  28   Calcium 8.9 - 10.3 mg/dL 9.7  9.7  9.5     Imaging studies: No new pertinent imaging studies   Assessment/Plan:  76 y.o. male with small bowel obstruction 3 Day Post-Op s/p reduction of internal hernia.  -Adequate vital signs -Abdominal exam improved today.  No distention,  nontympanic. -We will do x-ray since patient is unable to say if he is passing gas to see if there is any and normal gas pattern -We will consider starting clear liquid -No contraindication for PT -Continue medical management -I will continue to follow closely.  Arnold Long, MD

## 2022-01-10 NOTE — Progress Notes (Addendum)
[facility]  ROUNDING NOTE   Subjective:   Patient seen sitting at side of bed, preparing to work with therapy.  Wife and daughter at bedside. Per RN note, Patient removed NGT overnight.  No peripheral edema  Objective:  Vital signs in last 24 hours:  Temp:  [98.1 F (36.7 C)-98.7 F (37.1 C)] 98.1 F (36.7 C) (10/06 1201) Pulse Rate:  [59-83] 78 (10/06 1201) Resp:  [17] 17 (10/06 1201) BP: (132-156)/(54-63) 147/63 (10/06 1201) SpO2:  [98 %-100 %] 98 % (10/06 1201)  Weight change:  Filed Weights   01/04/22 0408 01/05/22 0421 01/09/22 0500  Weight: 72.8 kg 72.4 kg 68.1 kg    Intake/Output: I/O last 3 completed shifts: In: 3363.5 [I.V.:3073.3; NG/GT:90; IV Piggyback:200.2] Out: 2820 [Urine:2800; Emesis/NG output:20]   Intake/Output this shift:  Total I/O In: 300.1 [I.V.:300.1] Out: -   Physical Exam: General: NAD  Head: Normocephalic, atraumatic.   Eyes: Anicteric  Lungs:  Clear to auscultation, normal effort  Heart: Regular rate and rhythm  Abdomen:  Soft, nontender, suprapubic catheter in place  Extremities:  No peripheral edema.  Neurologic: Nonfocal, moving all four extremities  Skin: No lesions  Access: None  GU-Foley catheter  Basic Metabolic Panel: Recent Labs  Lab 01/04/22 0731 01/05/22 0521 01/06/22 0523 01/07/22 0538 01/08/22 0421 01/09/22 0605 01/10/22 0553  NA 130* 134* 134* 140 142 145 149*  K 3.4* 3.5 2.8* 3.1* 3.5 3.1* 3.1*  CL 104 106 104 104 104 100 102  CO2 16* 17* 21* 23 28 35* 37*  GLUCOSE 102* 76 103* 111* 167* 103* 76  BUN 99* 97* 95* 94* 96* 94* 88*  CREATININE 4.97* 4.96* 4.97* 5.08* 5.15* 5.21* 5.09*  CALCIUM 9.7 10.1 9.8 9.7 9.5 9.7 9.7  MG 2.2 2.3 2.2 2.4  --   --   --   PHOS 4.7*  --   --   --   --   --   --      Liver Function Tests: Recent Labs  Lab 01/03/22 1252 01/04/22 0731  AST 13*  --   ALT 12  --   ALKPHOS 46  --   BILITOT 0.7  --   PROT 6.7  --   ALBUMIN 3.1* 2.6*    No results for  input(s): "LIPASE", "AMYLASE" in the last 168 hours. No results for input(s): "AMMONIA" in the last 168 hours.  CBC: Recent Labs  Lab 01/05/22 0521 01/06/22 0523 01/08/22 0421 01/09/22 0605 01/10/22 0553  WBC 10.1 10.3 12.2* 16.5* 15.0*  HGB 9.1* 8.6* 8.3* 7.6* 8.1*  HCT 26.9* 25.6* 25.5* 23.4* 25.6*  MCV 78.9* 78.8* 82.0 83.3 85.6  PLT 274 288 293 261 255     Cardiac Enzymes: No results for input(s): "CKTOTAL", "CKMB", "CKMBINDEX", "TROPONINI" in the last 168 hours.  BNP: Invalid input(s): "POCBNP"  CBG: Recent Labs  Lab 01/04/22 2012  GLUCAP 67     Microbiology: Results for orders placed or performed during the hospital encounter of 01/03/22  Urine Culture     Status: None   Collection Time: 01/05/22  8:56 AM   Specimen: Urine, Suprapubic  Result Value Ref Range Status   Specimen Description   Final    URINE, SUPRAPUBIC Performed at [facility], [address] [place], [place] [address]    Special Requests   Final    NONE Performed at [facility], [address], [place], [place] [address]    Culture   Final  NO GROWTH Performed at [facility], [place] [address] [place], [place] [address]    Report Status 01/06/2022 FINAL  Final    Coagulation Studies: No results for input(s): "LABPROT", "INR" in the last 72 hours.  Urinalysis: No results for input(s): "COLORURINE", "LABSPEC", "PHURINE", "GLUCOSEU", "HGBUR", "BILIRUBINUR", "KETONESUR", "PROTEINUR", "UROBILINOGEN", "NITRITE", "LEUKOCYTESUR" in the last 72 hours.  Invalid input(s): "APPERANCEUR"    Imaging: DG Abd 1 View  Result Date: 01/10/2022 CLINICAL DATA:  Follow-up for bowel obstruction. Abdominal pain and distension. EXAM: ABDOMEN - 1 VIEW COMPARISON:  01/06/2022 and older exams.  CT, 01/03/2022. FINDINGS: Multiple loops of dilated small bowel, less dilated than on the most recent prior study. Midline skin staples, new since the prior exam. IMPRESSION: 1. Findings  consistent with interval surgery since the previous radiographs. Mild small bowel dilation significantly improved from the prior exam, likely due to mild postoperative ileus. Electronically Signed   By: Lajean Manes M.D.   On: 01/10/2022 11:48     Medications:    sodium chloride     ceFEPime (MAXIPIME) IV Stopped (01/09/22 2332)   dextrose 75 mL/hr at 01/10/22 0904    metoprolol tartrate  5 mg Intravenous Q8H   polyethylene glycol  17 g Oral Daily   sodium chloride flush  3 mL Intravenous Q12H   sodium chloride, acetaminophen **OR** acetaminophen, haloperidol lactate, morphine injection, sodium chloride flush  Assessment/ Plan:  Troy Berry. is a 76 y.o.  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 Renal function remains stable with good urine output of 2L. No acute need for dialysis, will continue to monitor.   Lab Results  Component Value Date   CREATININE 5.09 (H) 01/10/2022   CREATININE 5.21 (H) 01/09/2022   CREATININE 5.15 (H) 01/08/2022    Intake/Output Summary (Last 24 hours) at 01/10/2022 1213 Last data filed at 01/10/2022 0904 Gross per 24 hour  Intake 2155.47 ml  Output 1420 ml  Net 735.47 ml    Hypernatremia likely due to decreased oral intake, continue D5 infusion.   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   Small bowel obstruction Status post exploratory laparotomy on 01/07/2022   LOS: 6 [place] 10/6/202312:13 PM

## 2022-01-10 NOTE — Anesthesia Preprocedure Evaluation (Signed)
Anesthesia Evaluation  Patient identified by MRN, date of birth, ID band Patient awake    Reviewed: Allergy & Precautions, H&P , NPO status , Patient's Chart, lab work & pertinent test results, reviewed documented beta blocker date and time   History of Anesthesia Complications Negative for: history of anesthetic complications  Airway Mallampati: Unable to assess  TM Distance: >3 FB Neck ROM: full    Dental  (+) Dental Advidsory Given   Pulmonary neg pulmonary ROS, neg COPD, former smoker,    Pulmonary exam normal breath sounds clear to auscultation       Cardiovascular Exercise Tolerance: Good hypertension, (-) angina(-) Past MI and (-) Cardiac Stents negative cardio ROS Normal cardiovascular exam(-) dysrhythmias (-) Valvular Problems/Murmurs Rhythm:regular Rate:Normal     Neuro/Psych PSYCHIATRIC DISORDERS Dementia negative neurological ROS     GI/Hepatic negative GI ROS, Neg liver ROS,   Endo/Other  negative endocrine ROS  Renal/GU CRFRenal disease  negative genitourinary   Musculoskeletal   Abdominal   Peds  Hematology negative hematology ROS (+) anemia ,   Anesthesia Other Findings Past Medical History: No date: Anemia No date: CKD (chronic kidney disease), stage III (HCC) No date: Gout No date: Hypertension No date: Urinary retention     Comment:  in-dwelling Foley since Nov 2019, sees Dr. Hollice Espy  Past Surgical History: 12/30/2021: IR CATHETER TUBE CHANGE 01/01/2022: IR RADIOLOGIST EVAL & MGMT 01/07/2022: LAPAROTOMY; N/A     Comment:  Procedure: EXPLORATORY LAPAROTOMY;  Surgeon:               Herbert Pun, MD;  Location: [facility] ORS;  Service:              General;  Laterality: N/A;  BMI    Body Mass Index: 22.17 kg/m      Reproductive/Obstetrics negative OB ROS                             Anesthesia Physical  Anesthesia Plan  ASA: 3 and  emergent  Anesthesia Plan: General   Post-op Pain Management:    Induction: Intravenous  PONV Risk Score and Plan: 2 and Ondansetron and Dexamethasone  Airway Management Planned: Oral ETT  Additional Equipment:   Intra-op Plan:   Post-operative Plan: Extubation in OR  Informed Consent: I have reviewed the patients History and Physical, chart, labs and discussed the procedure including the risks, benefits and alternatives for the proposed anesthesia with the patient or authorized representative who has indicated his/her understanding and acceptance.     Dental Advisory Given  Plan Discussed with: Anesthesiologist, CRNA and Surgeon  Anesthesia Plan Comments: (Patient consented for risks of anesthesia including but not limited to:  - adverse reactions to medications - damage to eyes, teeth, lips or other oral mucosa - nerve damage due to positioning  - sore throat or hoarseness - Damage to heart, brain, nerves, lungs, other parts of body or loss of life  )        Anesthesia Quick Evaluation

## 2022-01-10 NOTE — Progress Notes (Signed)
Patient ID: Troy Beals., male   DOB: 1945/07/11, 76 y.o.   MRN: [id number] I was called by nurse with reporting bleeding and drainage from wound. Upon arrival patient found with abundant drainage through midline wound. Omentum exposed. Skin closed with staples.   Due to wound dehiscence and exposed omentum and patient needs to go to OR emergently for closure of abdominal wall. Family has been trying to contact but both phones are in the room. Will take consent as soon as they arrive.   Herbert Pun, MD FACS

## 2022-01-10 NOTE — NC FL2 (Signed)
[place] LEVEL OF CARE SCREENING TOOL     IDENTIFICATION  [name]. Birthdate: 19-Apr-1945 Sex: male Admission Date (Current Location): 01/03/2022  [facility] and [place] Number:  Engineering geologist and Address:  [facility], [address] [place], [place] [address]      Provider Number: [id number]  Attending Physician Name and Address:  Wyvonnia Dusky, MD  Relative Name and Phone Number:       Current Level of Care: Hospital Recommended Level of Care: [place] Prior Approval Number:    Date Approved/Denied:   PASRR Number: [id number]  Discharge Plan: SNF    Current Diagnoses: Patient Active Problem List   Diagnosis Date Noted   Acute blood loss anemia 01/05/2022   Dementia without behavioral disturbance ([place]) 01/04/2022   Hypokalemia 01/04/2022   Acute renal failure superimposed on stage 4 chronic kidney disease ([place]) 01/03/2022   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[place]) 71/09/2692   Metabolic acidosis 85/46/2703   Hematuria 01/03/2022   SBO (small bowel obstruction) ([place]) 01/03/2022   Gross hematuria    Right inguinal hernia 10/13/2019   Urinary tract infection 04/29/2018   AKI (acute kidney injury) ([place])    Other hydronephrosis    Urinary retention    ARF (acute renal failure) ([place]) 07/29/2017   Anemia 03/09/2014   Chronic kidney disease 03/09/2014   Essential hypertension 03/09/2014    Orientation RESPIRATION BLADDER Height & Weight     Self  Normal Continent, Indwelling catheter (Suprapubic catheter) Weight: 150 lb 2.1 oz (68.1 kg) Height:  '5\' 9"'$  (175.3 cm)  BEHAVIORAL SYMPTOMS/MOOD NEUROLOGICAL BOWEL NUTRITION STATUS   (Pleasantly confused)  (Dementia) Continent Diet (Follow for discharge recommendations. Currently on clear liquids.)  AMBULATORY STATUS COMMUNICATION OF NEEDS Skin   Extensive Assist Verbally Surgical wounds, Other (Comment) (Incision on abdomen:  honeycomb dressing. Non-pressure wound on lower medial scrotum: moisture barrier.)                       Personal Care Assistance Level of Assistance  Bathing, Feeding, Dressing Bathing Assistance: Maximum assistance Feeding assistance: Limited assistance Dressing Assistance: Maximum assistance     Functional Limitations Info  Sight, Hearing, Speech Sight Info: Adequate Hearing Info: Adequate Speech Info: Adequate    SPECIAL CARE FACTORS FREQUENCY  PT (By licensed PT), OT (By licensed OT)     PT Frequency: 5 x week OT Frequency: 5 x week            Contractures Contractures Info: Not present    Additional Factors Info  Code Status, Allergies Code Status Info: Full code Allergies Info: Ace Inhibitors           Current Medications (01/10/2022):  This is the current hospital active medication list Current Facility-Administered Medications  Medication Dose Route Frequency Provider Last Rate Last Admin   0.9 %  sodium chloride infusion  250 mL Intravenous PRN Herbert Pun, MD       acetaminophen (TYLENOL) tablet 650 mg  650 mg Oral Q6H PRN Wynelle Cleveland, RPH   650 mg at 01/09/22 1617   Or   acetaminophen (TYLENOL) suppository 650 mg  650 mg Rectal Q6H PRN Wynelle Cleveland, RPH       ceFEPIme (MAXIPIME) 1 g in sodium chloride 0.9 % 100 mL IVPB  1 g Intravenous Q24H Wyvonnia Dusky, MD   Stopped at 01/09/22 2332   dextrose 5 % solution   Intravenous  Continuous Wyvonnia Dusky, MD 75 mL/hr at 01/10/22 3361 New Bag at 01/10/22 0904   feeding supplement (BOOST / RESOURCE BREEZE) liquid 1 Container  1 Container Oral TID BM Wyvonnia Dusky, MD   1 Container at 01/10/22 1311   haloperidol lactate (HALDOL) injection 2 mg  2 mg Intravenous Q6H PRN Herbert Pun, MD       metoprolol tartrate (LOPRESSOR) injection 5 mg  5 mg Intravenous Q8H Herbert Pun, MD   5 mg at 01/10/22 1311   morphine (PF) 4 MG/ML injection 4 mg  4 mg  Intravenous Q4H PRN Herbert Pun, MD   4 mg at 01/09/22 2256   [START ON 01/11/2022] multivitamin with minerals tablet 1 tablet  1 tablet Oral Daily Wyvonnia Dusky, MD       polyethylene glycol (MIRALAX / GLYCOLAX) packet 17 g  17 g Oral Daily Herbert Pun, MD   17 g at 01/09/22 0913   sodium chloride flush (NS) 0.9 % injection 3 mL  3 mL Intravenous Q12H Herbert Pun, MD   3 mL at 01/09/22 0914   sodium chloride flush (NS) 0.9 % injection 3 mL  3 mL Intravenous PRN Herbert Pun, MD         Discharge Medications: Please see discharge summary for a list of discharge medications.  Relevant Imaging Results:  Relevant Lab Results:   Additional Information SS#: [id number]  Candie Chroman, LCSW

## 2022-01-11 ENCOUNTER — Encounter: Payer: Self-pay | Admitting: General Surgery

## 2022-01-11 DIAGNOSIS — K56609 Unspecified intestinal obstruction, unspecified as to partial versus complete obstruction: Secondary | ICD-10-CM | POA: Diagnosis not present

## 2022-01-11 DIAGNOSIS — F0390 Unspecified dementia without behavioral disturbance: Secondary | ICD-10-CM | POA: Diagnosis not present

## 2022-01-11 DIAGNOSIS — N179 Acute kidney failure, unspecified: Secondary | ICD-10-CM | POA: Diagnosis not present

## 2022-01-11 DIAGNOSIS — N184 Chronic kidney disease, stage 4 (severe): Secondary | ICD-10-CM | POA: Diagnosis not present

## 2022-01-11 LAB — CBC
HCT: 26.3 % — ABNORMAL LOW (ref 39.0–52.0)
Hemoglobin: 8.1 g/dL — ABNORMAL LOW (ref 13.0–17.0)
MCH: 26.3 pg (ref 26.0–34.0)
MCHC: 30.8 g/dL (ref 30.0–36.0)
MCV: 85.4 fL (ref 80.0–100.0)
Platelets: 265 10*3/uL (ref 150–400)
RBC: 3.08 MIL/uL — ABNORMAL LOW (ref 4.22–5.81)
RDW: 15.7 % — ABNORMAL HIGH (ref 11.5–15.5)
WBC: 12.2 10*3/uL — ABNORMAL HIGH (ref 4.0–10.5)
nRBC: 0 % (ref 0.0–0.2)

## 2022-01-11 LAB — BASIC METABOLIC PANEL
Anion gap: 11 (ref 5–15)
BUN: 83 mg/dL — ABNORMAL HIGH (ref 8–23)
CO2: 33 mmol/L — ABNORMAL HIGH (ref 22–32)
Calcium: 9.6 mg/dL (ref 8.9–10.3)
Chloride: 101 mmol/L (ref 98–111)
Creatinine, Ser: 4.83 mg/dL — ABNORMAL HIGH (ref 0.61–1.24)
GFR, Estimated: 12 mL/min — ABNORMAL LOW (ref >60)
Glucose, Bld: 165 mg/dL — ABNORMAL HIGH (ref 70–99)
Potassium: 3.5 mmol/L (ref 3.5–5.1)
Sodium: 145 mmol/L (ref 135–145)

## 2022-01-11 LAB — MAGNESIUM: Magnesium: 2.4 mg/dL (ref 1.7–2.4)

## 2022-01-11 LAB — PHOSPHORUS: Phosphorus: 6.1 mg/dL — ABNORMAL HIGH (ref 2.5–4.6)

## 2022-01-11 NOTE — Progress Notes (Signed)
Patient ID: Troy Berry., male   DOB: [birth year], 76 y.o.   MRN: [id number]     [place] [facility] Day(s): 7.   Interval History: Patient seen and examined, no acute events or new complaints overnight.  No concerns were presented by nurse this morning.  Patient had a bowel movement when he was transferred to the operation room yesterday.  Unable to say if he has passed any more gas overnight.  There has been no new bowel movement today.  Urine output today was reported at 900 which I think that is adequate for this patient.  The urine in the bag look pink-colored without any sign of gross blood.  Vital signs in last 24 hours: [min-max] current  Temp:  [97.4 F (36.3 C)-98.2 F (36.8 C)] 98.2 F (36.8 C) (10/07 0444) Pulse Rate:  [56-109] 83 (10/07 0803) Resp:  [14-18] 18 (10/07 0444) BP: (104-160)/(56-89) 145/66 (10/07 0803) SpO2:  [94 %-100 %] 95 % (10/07 0803) Weight:  [64.5 kg] 64.5 kg (10/07 0603)     Height: '5\' 9"'$  (175.3 cm) Weight: 64.5 kg BMI (Calculated): 20.99   Physical Exam:  Constitutional: alert, no distress Respiratory: breathing non-labored at rest  Cardiovascular: regular rate and sinus rhythm  Gastrointestinal: soft, non-tender, and non-distended  Labs:     Latest Ref Rng & Units 01/11/2022    6:28 AM 01/10/2022    5:53 AM 01/09/2022    6:05 AM  CBC  WBC 4.0 - 10.5 K/uL 12.2  15.0  16.5   Hemoglobin 13.0 - 17.0 g/dL 8.1  8.1  7.6   Hematocrit 39.0 - 52.0 % 26.3  25.6  23.4   Platelets 150 - 400 K/uL 265  255  261       Latest Ref Rng & Units 01/11/2022    6:28 AM 01/10/2022    5:53 AM 01/09/2022    6:05 AM  CMP  Glucose 70 - 99 mg/dL 165  76  103   BUN 8 - 23 mg/dL 83  88  94   Creatinine 0.61 - 1.24 mg/dL 4.83  5.09  5.21   Sodium 135 - 145 mmol/L 145  149  145   Potassium 3.5 - 5.1 mmol/L 3.5  3.1  3.1   Chloride 98 - 111 mmol/L 101  102  100   CO2 22 - 32 mmol/L 33  37  35   Calcium 8.9 - 10.3 mg/dL 9.6  9.7  9.7     Imaging  studies: No new pertinent imaging studies   Assessment/Plan:  76 y.o. male with small bowel obstruction 4 Day Post-Op s/p reduction of internal hernia.  The was complicated by midline wound dehiscence status post wound closure postop day #1.  -Today patient seems to be on his baseline state.  No distress. -Vital signs are stable -No abdominal wall distention.  Abdomen is soft and depressible.  The wound is dry and clean.  No sign of drainage. -Suprapubic catheter in place.  Improved color from the urine.  I think that 900 cc in the last 24 hours seems to be adequate for him. -Continue MiraLAX pregnancy patient.  We will continue with critical diet until we get better bowel function return.  Arnold Long, MD

## 2022-01-11 NOTE — Progress Notes (Signed)
PROGRESS NOTE    Troy Berry.  SEG:[id number] DOB: 01-22-1946 DOA: 01/03/2022 PCP: [facility], [facility]   Assessment & Plan:   Principal Problem:   Acute renal failure superimposed on stage 4 chronic kidney disease ([place]) Active Problems:   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HCC)   SBO (small bowel obstruction) (HCC)   Anemia   Hematuria   Urinary tract infection   Metabolic acidosis   Essential hypertension   Gross hematuria   Dementia without behavioral disturbance (HCC)   Hypokalemia   Acute blood loss anemia  Assessment and Plan: AKI on CKDIV: Cr is trending down again today. Likely secondary to ATN due concurrent illness & SBO as per nephro. Nephro following and recs apprec  Suprapubic catheter dysfunction: w/ hx of atonic bladder requiring suprapubic catheter placement approx on 12/05/21. On 12/30/2021, catheter was replaced by IR.  Since that time, patient has been experiencing difficulties including hematuria and poor urinary drainage.  He was reevaluated by urology and IR on 9/27 without any improvement.  On admission, it was discovered that suprapubic catheter tip was not in the bladder but adjacent to the small bowel. Suprapubic catheter replaced with cystogram demonstrating appropriate placement within bladder as per urology   SBO: CT renal study with evidence of some small bowel dilation with no focal point demonstrated.  CT cystogram demonstrates persistent small bowel dilation with evidence of focal transition and in the central mesentery.  Findings are consistent with small bowel obstruction. S/p ex lap & reduction of hernia on 01/07/22 as per gen surg. S/p re-opening of recent laparotomy on 01/10/22 as wound dehiscence & exposed omentum as per gen surg. Continue on clear liquid diet    ACD: secondary to CKD & hematuria. H&H are stable    Hematuria: improved but still present. Will continue to monitor    UTI: secondary to obstructive uropathy. Continue on  IV cefepime x 10 days total     Hyponatremia: resolved  Hypernatremia: resolved    Metabolic acidosis: resolved   HTN: continue on coreg. Holding amlodipine, lasix.  Hypokalemia: WNL today    Dementia: w/o behavioral disturbance. Continue w/ supportive care        DVT prophylaxis: SCDs Code Status: full  Family Communication:  Disposition Plan: depends on PT/OT recs  Level of care: Med-Surg  Status is: Inpatient  Remains inpatient appropriate because: severity of illness  Consultants:  Nephro General surg   Procedures:   Antimicrobials: cefepime   Subjective: Pt appears comfortable but will not answer any of my questions   Objective: Vitals:   01/10/22 2006 01/11/22 0444 01/11/22 0446 01/11/22 0603  BP: 132/71 (!) 150/65    Pulse: (!) 59 91 96   Resp: 16 18    Temp: (!) 97.5 F (36.4 C) 98.2 F (36.8 C)    TempSrc:  Oral    SpO2: 100%  95%   Weight:    64.5 kg  Height:        Intake/Output Summary (Last 24 hours) at 01/11/2022 0756 Last data filed at 01/11/2022 0454 Gross per 24 hour  Intake 2080.99 ml  Output 905 ml  Net 1175.99 ml   Filed Weights   01/05/22 0421 01/09/22 0500 01/11/22 0603  Weight: 72.4 kg 68.1 kg 64.5 kg    Examination:  General exam: Appears calm & comfortable  Respiratory system: clear breath sounds b/l  Cardiovascular system: S1/S2+. No rubs or gallops  Gastrointestinal system: Abd is soft, NT, ND & hypoactive bowel sounds  Central nervous system: Alert and awake  Psychiatry: judgement and insight appears poor. Flat mood and affect     Data Reviewed: I have personally reviewed following labs and imaging studies  CBC: Recent Labs  Lab 01/06/22 0523 01/08/22 0421 01/09/22 0605 01/10/22 0553 01/11/22 0628  WBC 10.3 12.2* 16.5* 15.0* 12.2*  HGB 8.6* 8.3* 7.6* 8.1* 8.1*  HCT 25.6* 25.5* 23.4* 25.6* 26.3*  MCV 78.8* 82.0 83.3 85.6 85.4  PLT 288 293 261 255 009   Basic Metabolic Panel: Recent Labs  Lab  01/05/22 0521 01/06/22 0523 01/07/22 0538 01/08/22 0421 01/09/22 0605 01/10/22 0553 01/11/22 0628  NA 134* 134* 140 142 145 149* 145  K 3.5 2.8* 3.1* 3.5 3.1* 3.1* 3.5  CL 106 104 104 104 100 102 101  CO2 17* 21* 23 28 35* 37* 33*  GLUCOSE 76 103* 111* 167* 103* 76 165*  BUN 97* 95* 94* 96* 94* 88* 83*  CREATININE 4.96* 4.97* 5.08* 5.15* 5.21* 5.09* 4.83*  CALCIUM 10.1 9.8 9.7 9.5 9.7 9.7 9.6  MG 2.3 2.2 2.4  --   --   --  2.4  PHOS  --   --   --   --   --   --  6.1*   GFR: Estimated Creatinine Clearance: 11.7 mL/min (A) (by C-G formula based on SCr of 4.83 mg/dL (H)). Liver Function Tests: No results for input(s): "AST", "ALT", "ALKPHOS", "BILITOT", "PROT", "ALBUMIN" in the last 168 hours.  No results for input(s): "LIPASE", "AMYLASE" in the last 168 hours. No results for input(s): "AMMONIA" in the last 168 hours. Coagulation Profile: No results for input(s): "INR", "PROTIME" in the last 168 hours. Cardiac Enzymes: No results for input(s): "CKTOTAL", "CKMB", "CKMBINDEX", "TROPONINI" in the last 168 hours. BNP (last 3 results) No results for input(s): "PROBNP" in the last 8760 hours. HbA1C: No results for input(s): "HGBA1C" in the last 72 hours. CBG: Recent Labs  Lab 01/04/22 2012  GLUCAP 89   Lipid Profile: No results for input(s): "CHOL", "HDL", "LDLCALC", "TRIG", "CHOLHDL", "LDLDIRECT" in the last 72 hours. Thyroid Function Tests: No results for input(s): "TSH", "T4TOTAL", "FREET4", "T3FREE", "THYROIDAB" in the last 72 hours. Anemia Panel: No results for input(s): "VITAMINB12", "FOLATE", "FERRITIN", "TIBC", "IRON", "RETICCTPCT" in the last 72 hours. Sepsis Labs: Recent Labs  Lab 01/07/22 0538 01/08/22 0421  PROCALCITON 8.66 6.11    Recent Results (from the past 240 hour(s))  Urine Culture     Status: None   Collection Time: 01/05/22  8:56 AM   Specimen: Urine, Suprapubic  Result Value Ref Range Status   Specimen Description   Final    URINE,  SUPRAPUBIC Performed at [facility], [address] [place], [place] [address]    Special Requests   Final    NONE Performed at [facility], [address], [place], [place] [address]    Culture   Final    NO GROWTH Performed at [facility], [place] [address] [place], [place] [address]    Report Status 01/06/2022 FINAL  Final         Radiology Studies: DG Abd 1 View  Result Date: 01/10/2022 CLINICAL DATA:  Follow-up for bowel obstruction. Abdominal pain and distension. EXAM: ABDOMEN - 1 VIEW COMPARISON:  01/06/2022 and older exams.  CT, 01/03/2022. FINDINGS: Multiple loops of dilated small bowel, less dilated than on the most recent prior study. Midline skin staples, new since the prior exam. IMPRESSION: 1. Findings consistent with interval surgery since  the previous radiographs. Mild small bowel dilation significantly improved from the prior exam, likely due to mild postoperative ileus. Electronically Signed   By: Lajean Manes M.D.   On: 01/10/2022 11:48        Scheduled Meds:  carvedilol  25 mg Oral BID WC   feeding supplement  1 Container Oral TID BM   multivitamin with minerals  1 tablet Oral Daily   polyethylene glycol  17 g Oral Daily   sodium chloride flush  3 mL Intravenous Q12H   Continuous Infusions:  sodium chloride     ceFEPime (MAXIPIME) IV Stopped (01/10/22 2042)   dextrose 75 mL/hr at 01/11/22 0400     LOS: 7 days    Time spent: 30 mins     Wyvonnia Dusky, MD [facility] Pager [phone]  If 7PM-7AM, please contact night-coverage [email or web address] 01/11/2022, 7:56 AM

## 2022-01-11 NOTE — Progress Notes (Signed)
[facility]  ROUNDING NOTE   Subjective:   Patient resting quietly, alert Safety sitter at bedside No family at bedside Tolerating clear liquid diet  Creatinine 4.83 UOP 994m   Objective:  Vital signs in last 24 hours:  Temp:  [97.4 F (36.3 C)-98.2 F (36.8 C)] 98.2 F (36.8 C) (10/07 0444) Pulse Rate:  [56-109] 83 (10/07 0803) Resp:  [14-18] 18 (10/07 0444) BP: (104-160)/(56-89) 145/66 (10/07 0803) SpO2:  [94 %-100 %] 95 % (10/07 0803) Weight:  [64.5 kg] 64.5 kg (10/07 0603)  Weight change:  Filed Weights   01/05/22 0421 01/09/22 0500 01/11/22 0603  Weight: 72.4 kg 68.1 kg 64.5 kg    Intake/Output: I/O last 3 completed shifts: In: 2930.9 [I.V.:2580.9; IV Piggyback:350] Out: 2305 [Urine:2300; Blood:5]   Intake/Output this shift:  Total I/O In: 75 [P.O.:75] Out: 100 [Urine:100]  Physical Exam: General: NAD  Head: Normocephalic, atraumatic.   Eyes: Anicteric  Lungs:  Clear to auscultation, normal effort  Heart: Regular rate and rhythm  Abdomen:  Soft, nontender, suprapubic catheter in place  Extremities:  No peripheral edema.  Neurologic: Nonfocal, moving all four extremities  Skin: No lesions  Access: None  GU-Foley catheter-hematuria improving  Basic Metabolic Panel: Recent Labs  Lab 01/05/22 0521 01/06/22 0523 01/07/22 0538 01/08/22 0421 01/09/22 0605 01/10/22 0553 01/11/22 0628  NA 134* 134* 140 142 145 149* 145  K 3.5 2.8* 3.1* 3.5 3.1* 3.1* 3.5  CL 106 104 104 104 100 102 101  CO2 17* 21* 23 28 35* 37* 33*  GLUCOSE 76 103* 111* 167* 103* 76 165*  BUN 97* 95* 94* 96* 94* 88* 83*  CREATININE 4.96* 4.97* 5.08* 5.15* 5.21* 5.09* 4.83*  CALCIUM 10.1 9.8 9.7 9.5 9.7 9.7 9.6  MG 2.3 2.2 2.4  --   --   --  2.4  PHOS  --   --   --   --   --   --  6.1*     Liver Function Tests: No results for input(s): "AST", "ALT", "ALKPHOS", "BILITOT", "PROT", "ALBUMIN" in the last 168 hours.  No results for input(s): "LIPASE", "AMYLASE" in  the last 168 hours. No results for input(s): "AMMONIA" in the last 168 hours.  CBC: Recent Labs  Lab 01/06/22 0523 01/08/22 0421 01/09/22 0605 01/10/22 0553 01/11/22 0628  WBC 10.3 12.2* 16.5* 15.0* 12.2*  HGB 8.6* 8.3* 7.6* 8.1* 8.1*  HCT 25.6* 25.5* 23.4* 25.6* 26.3*  MCV 78.8* 82.0 83.3 85.6 85.4  PLT 288 293 261 255 265     Cardiac Enzymes: No results for input(s): "CKTOTAL", "CKMB", "CKMBINDEX", "TROPONINI" in the last 168 hours.  BNP: Invalid input(s): "POCBNP"  CBG: Recent Labs  Lab 01/04/22 2012  GLUCAP 88    Microbiology: Results for orders placed or performed during the hospital encounter of 01/03/22  Urine Culture     Status: None   Collection Time: 01/05/22  8:56 AM   Specimen: Urine, Suprapubic  Result Value Ref Range Status   Specimen Description   Final    URINE, SUPRAPUBIC [facility] [address], [place] [address]   Special Requests   Final    NONE [facility]formed at [facility] [address], [place] [address]   Culture   Final    NO GROWTH [facility]formed at [facility] [address] [place] [address]   Report Status 01/06/2022 FINAL  Final    Coagulation Studies:  No results for input(s): "LABPROT", "INR" in the last 72 hours.  Urinalysis: No results for input(s): "COLORURINE", "LABSPEC", "PHURINE", "GLUCOSEU", "HGBUR", "BILIRUBINUR", "KETONESUR", "PROTEINUR", "UROBILINOGEN", "NITRITE", "LEUKOCYTESUR" in the last 72 hours.  Invalid input(s): "APPERANCEUR"    Imaging: DG Abd 1 View  Result Date: 01/10/2022 CLINICAL DATA:  Follow-up for bowel obstruction. Abdominal pain and distension. EXAM: ABDOMEN - 1 VIEW COMPARISON:  01/06/2022 and older exams.  CT, 01/03/2022. FINDINGS: Multiple loops of dilated small bowel, less dilated than on the most recent prior study. Midline skin staples, new since the prior exam. IMPRESSION: 1. Findings consistent with interval surgery  since the previous radiographs. Mild small bowel dilation significantly improved from the prior exam, likely due to mild postoperative ileus. Electronically Signed   By: Lajean Manes M.D.   On: 01/10/2022 11:48     Medications:    sodium chloride     ceFEPime (MAXIPIME) IV Stopped (01/10/22 2042)   dextrose 75 mL/hr at 01/11/22 0400    carvedilol  25 mg Oral BID WC   feeding supplement  1 Container Oral TID BM   multivitamin with minerals  1 tablet Oral Daily   polyethylene glycol  17 g Oral Daily   sodium chloride flush  3 mL Intravenous Q12H   sodium chloride, acetaminophen **OR** acetaminophen, haloperidol lactate, metoprolol tartrate, morphine injection, sodium chloride flush  Assessment/ Plan:  [name] is a 76 y.o.  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 Creatinine improved today with adequate urine output recorded. No need for dialysis at this time. Will continue to monitor.  Lab Results  Component Value Date   CREATININE 4.83 (H) 01/11/2022   CREATININE 5.09 (H) 01/10/2022   CREATININE 5.21 (H) 01/09/2022    Intake/Output Summary (Last 24 hours) at 01/11/2022 1115 Last data filed at 01/11/2022 1040 Gross per 24 hour  Intake 1855.85 ml  Output 1005 ml  Net 850.85 ml    Hypernatremia likely due to decreased oral intake. Corrected to 145 today. Tolerating clear liquid diet.  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 Improving   Small bowel obstruction Status post exploratory laparotomy on  01/07/2022. Experienced wound dehiscence with repair in OR on 01/10/22.   LOS: 7   10/7/202311:15 AM

## 2022-01-11 NOTE — Progress Notes (Signed)
Suprapubic cath irrigated with 60 mLs of NS In and out. 1 clot pulled out.

## 2022-01-12 ENCOUNTER — Encounter: Payer: Self-pay | Admitting: General Surgery

## 2022-01-12 DIAGNOSIS — N184 Chronic kidney disease, stage 4 (severe): Secondary | ICD-10-CM | POA: Diagnosis not present

## 2022-01-12 DIAGNOSIS — K56609 Unspecified intestinal obstruction, unspecified as to partial versus complete obstruction: Secondary | ICD-10-CM | POA: Diagnosis not present

## 2022-01-12 DIAGNOSIS — N179 Acute kidney failure, unspecified: Secondary | ICD-10-CM | POA: Diagnosis not present

## 2022-01-12 DIAGNOSIS — F0390 Unspecified dementia without behavioral disturbance: Secondary | ICD-10-CM | POA: Diagnosis not present

## 2022-01-12 LAB — MAGNESIUM: Magnesium: 2.3 mg/dL (ref 1.7–2.4)

## 2022-01-12 LAB — BASIC METABOLIC PANEL
Anion gap: 12 (ref 5–15)
BUN: 78 mg/dL — ABNORMAL HIGH (ref 8–23)
CO2: 32 mmol/L (ref 22–32)
Calcium: 9.9 mg/dL (ref 8.9–10.3)
Chloride: 94 mmol/L — ABNORMAL LOW (ref 98–111)
Creatinine, Ser: 4.85 mg/dL — ABNORMAL HIGH (ref 0.61–1.24)
GFR, Estimated: 12 mL/min — ABNORMAL LOW (ref >60)
Glucose, Bld: 119 mg/dL — ABNORMAL HIGH (ref 70–99)
Potassium: 3.4 mmol/L — ABNORMAL LOW (ref 3.5–5.1)
Sodium: 138 mmol/L (ref 135–145)

## 2022-01-12 LAB — CBC
HCT: 27 % — ABNORMAL LOW (ref 39.0–52.0)
Hemoglobin: 8.5 g/dL — ABNORMAL LOW (ref 13.0–17.0)
MCH: 27 pg (ref 26.0–34.0)
MCHC: 31.5 g/dL (ref 30.0–36.0)
MCV: 85.7 fL (ref 80.0–100.0)
Platelets: 260 10*3/uL (ref 150–400)
RBC: 3.15 MIL/uL — ABNORMAL LOW (ref 4.22–5.81)
RDW: 15.7 % — ABNORMAL HIGH (ref 11.5–15.5)
WBC: 18.7 10*3/uL — ABNORMAL HIGH (ref 4.0–10.5)
nRBC: 0 % (ref 0.0–0.2)

## 2022-01-12 LAB — PHOSPHORUS: Phosphorus: 4.9 mg/dL — ABNORMAL HIGH (ref 2.5–4.6)

## 2022-01-12 MED ORDER — POLYETHYLENE GLYCOL 3350 17 G PO PACK
17.0000 g | PACK | Freq: Two times a day (BID) | ORAL | Status: DC
  Administered 2022-01-12 – 2022-01-30 (×26): 17 g via ORAL
  Filled 2022-01-12 (×29): qty 1

## 2022-01-12 NOTE — Progress Notes (Signed)
SPC irrigated as ordered.  Flushed without any resistance noted, no clots seen, returned with pink-tinged urine.  Repositioned to comfortable position in bed.  Sitter at bedside.

## 2022-01-12 NOTE — Progress Notes (Signed)
PROGRESS NOTE    Troy Berry.  ZOX:[id number] DOB: 23-Aug-1945 DOA: 01/03/2022 PCP: [facility]   Assessment & Plan:   Principal Problem:   Acute renal failure superimposed on stage 4 chronic kidney disease ([place]) Active Problems:   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HCC)   SBO (small bowel obstruction) (HCC)   Anemia   Hematuria   Urinary tract infection   Metabolic acidosis   Essential hypertension   Gross hematuria   Dementia without behavioral disturbance (HCC)   Hypokalemia   Acute blood loss anemia  Assessment and Plan: AKI on CKDIV: Cr is trending up from day prior. Likely secondary to ATN due concurrent illness & SBO as per nephro. Nephro following and recs apprec  Suprapubic catheter dysfunction: w/ hx of atonic bladder requiring suprapubic catheter placement approx on 12/05/21. On 12/30/2021, catheter was replaced by IR.  Since that time, patient has been experiencing difficulties including hematuria and poor urinary drainage.  He was reevaluated by urology and IR on 9/27 without any improvement.  On admission, it was discovered that suprapubic catheter tip was not in the bladder but adjacent to the small bowel. Suprapubic catheter replaced with cystogram demonstrating appropriate placement within bladder as per urology   SBO: CT renal study with evidence of some small bowel dilation with no focal point demonstrated.  CT cystogram demonstrates persistent small bowel dilation with evidence of focal transition and in the central mesentery.  Findings are consistent with small bowel obstruction. S/p ex lap & reduction of hernia on 01/07/22 as per gen surg. S/p re-opening of recent laparotomy on 01/10/22 as wound dehiscence & exposed omentum as per gen surg. Continue on clear liquid diet    ACD: secondary to CKD & hematuria. H&H are stable    Hematuria: improved. H&H are stable    UTI: secondary to obstructive uropathy. Continue on IV cefepime x 10 days     Hyponatremia: resolved  Hypernatremia: resolved    Metabolic acidosis: resolved   HTN: continue on coreg and restart amlodipine. Holding lasix    Hypokalemia: will hold off on replacing potassium as Cr is elevated    Dementia: w/o behavioral disturbance. Continue w/ supportive care       DVT prophylaxis: SCDs Code Status: full  Family Communication:  Disposition Plan: possibly d/c to SNF  Level of care: Med-Surg  Status is: Inpatient  Remains inpatient appropriate because: severity of illness  Consultants:  Nephro General surg   Procedures:   Antimicrobials: cefepime   Subjective: Pt appears uncomfortable   Objective: Vitals:   01/11/22 1928 01/12/22 0253 01/12/22 0500 01/12/22 0746  BP: 139/65 (!) 159/71  (!) 154/52  Pulse: (!) 59 81  88  Resp: 18 19  (!) 22  Temp: 98.3 F (36.8 C) 97.7 F (36.5 C)  98.1 F (36.7 C)  TempSrc:      SpO2: 100% 98%  97%  Weight:   66 kg   Height:        Intake/Output Summary (Last 24 hours) at 01/12/2022 0805 Last data filed at 01/12/2022 0700 Gross per 24 hour  Intake 2135.37 ml  Output 525 ml  Net 1610.37 ml   Filed Weights   01/09/22 0500 01/11/22 0603 01/12/22 0500  Weight: 68.1 kg 64.5 kg 66 kg    Examination:  General exam: Appears uncomfortable  Respiratory system: clear breath sounds b/l  Cardiovascular system: S1 & S2+. No rubs or clicks  Gastrointestinal system: Abd is soft, NT, ND & hypoactive  bowel sounds  Central nervous system: Alert and awkae Psychiatry: judgement and insight appears poor. Flat mood and affect    Data Reviewed: I have personally reviewed following labs and imaging studies  CBC: Recent Labs  Lab 01/08/22 0421 01/09/22 0605 01/10/22 0553 01/11/22 0628 01/12/22 0656  WBC 12.2* 16.5* 15.0* 12.2* 18.7*  HGB 8.3* 7.6* 8.1* 8.1* 8.5*  HCT 25.5* 23.4* 25.6* 26.3* 27.0*  MCV 82.0 83.3 85.6 85.4 85.7  PLT 293 261 255 265 517   Basic Metabolic Panel: Recent Labs  Lab  01/06/22 0523 01/07/22 0538 01/08/22 0421 01/09/22 0605 01/10/22 0553 01/11/22 0628 01/12/22 0656  NA 134* 140 142 145 149* 145 138  K 2.8* 3.1* 3.5 3.1* 3.1* 3.5 3.4*  CL 104 104 104 100 102 101 94*  CO2 21* 23 28 35* 37* 33* 32  GLUCOSE 103* 111* 167* 103* 76 165* 119*  BUN 95* 94* 96* 94* 88* 83* 78*  CREATININE 4.97* 5.08* 5.15* 5.21* 5.09* 4.83* 4.85*  CALCIUM 9.8 9.7 9.5 9.7 9.7 9.6 9.9  MG 2.2 2.4  --   --   --  2.4 2.3  PHOS  --   --   --   --   --  6.1* 4.9*   GFR: Estimated Creatinine Clearance: 11.9 mL/min (A) (by C-G formula based on SCr of 4.85 mg/dL (H)). Liver Function Tests: No results for input(s): "AST", "ALT", "ALKPHOS", "BILITOT", "PROT", "ALBUMIN" in the last 168 hours.  No results for input(s): "LIPASE", "AMYLASE" in the last 168 hours. No results for input(s): "AMMONIA" in the last 168 hours. Coagulation Profile: No results for input(s): "INR", "PROTIME" in the last 168 hours. Cardiac Enzymes: No results for input(s): "CKTOTAL", "CKMB", "CKMBINDEX", "TROPONINI" in the last 168 hours. BNP (last 3 results) No results for input(s): "PROBNP" in the last 8760 hours. HbA1C: No results for input(s): "HGBA1C" in the last 72 hours. CBG: No results for input(s): "GLUCAP" in the last 168 hours.  Lipid Profile: No results for input(s): "CHOL", "HDL", "LDLCALC", "TRIG", "CHOLHDL", "LDLDIRECT" in the last 72 hours. Thyroid Function Tests: No results for input(s): "TSH", "T4TOTAL", "FREET4", "T3FREE", "THYROIDAB" in the last 72 hours. Anemia Panel: No results for input(s): "VITAMINB12", "FOLATE", "FERRITIN", "TIBC", "IRON", "RETICCTPCT" in the last 72 hours. Sepsis Labs: Recent Labs  Lab 01/07/22 0538 01/08/22 0421  PROCALCITON 8.66 6.11    Recent Results (from the past 240 hour(s))  Urine Culture     Status: None   Collection Time: 01/05/22  8:56 AM   Specimen: Urine, Suprapubic  Result Value Ref Range Status   Specimen Description   Final    URINE,  SUPRAPUBIC Performed at [facility], [address] [place], [place] [address]    Special Requests   Final    NONE Performed at [facility], [address], [place], [place] [address]    Culture   Final    NO GROWTH Performed at [facility], [place] [address] [place], [place] [address]    Report Status 01/06/2022 FINAL  Final         Radiology Studies: DG Abd 1 View  Result Date: 01/10/2022 CLINICAL DATA:  Follow-up for bowel obstruction. Abdominal pain and distension. EXAM: ABDOMEN - 1 VIEW COMPARISON:  01/06/2022 and older exams.  CT, 01/03/2022. FINDINGS: Multiple loops of dilated small bowel, less dilated than on the most recent prior study. Midline skin staples, new since the prior exam. IMPRESSION: 1. Findings consistent with interval surgery since the  previous radiographs. Mild small bowel dilation significantly improved from the prior exam, likely due to mild postoperative ileus. Electronically Signed   By: Lajean Manes M.D.   On: 01/10/2022 11:48        Scheduled Meds:  carvedilol  25 mg Oral BID WC   feeding supplement  1 Container Oral TID BM   multivitamin with minerals  1 tablet Oral Daily   polyethylene glycol  17 g Oral Daily   sodium chloride flush  3 mL Intravenous Q12H   Continuous Infusions:  sodium chloride     ceFEPime (MAXIPIME) IV Stopped (01/11/22 2022)   dextrose 75 mL/hr at 01/12/22 0500     LOS: 8 days    Time spent: 25 mins     Wyvonnia Dusky, MD [facility] Pager [phone]  If 7PM-7AM, please contact night-coverage [email or web address] 01/12/2022, 8:05 AM

## 2022-01-12 NOTE — Progress Notes (Addendum)
[facility]  ROUNDING NOTE   Subjective:   Patient seen laying in bed, eyes closed Safety sitter at bedside, no family present Occasional non productive cough Appetite appropriate on clear liquids   Creatinine 4.85 UOP 556m via suprapubic catheter   Objective:  Vital signs in last 24 hours:  Temp:  [97.7 F (36.5 C)-98.5 F (36.9 C)] 98.1 F (36.7 C) (10/08 0746) Pulse Rate:  [59-88] 88 (10/08 0746) Resp:  [17-22] 22 (10/08 0746) BP: (139-159)/(52-71) 154/52 (10/08 0746) SpO2:  [93 %-100 %] 97 % (10/08 0746) Weight:  [66 kg] 66 kg (10/08 0500)  Weight change: 1.5 kg Filed Weights   01/09/22 0500 01/11/22 0603 01/12/22 0500  Weight: 68.1 kg 64.5 kg 66 kg    Intake/Output: I/O last 3 completed shifts: In: 2775.5 [P.O.:795; I.V.:1780.5; IV Piggyback:200] Out: 950 [Urine:950]   Intake/Output this shift:  Total I/O In: 414 [P.O.:120; I.V.:294] Out: 280 [Urine:280]  Physical Exam: General: NAD  Head: Normocephalic, atraumatic.   Eyes: Anicteric  Lungs:  Clear to auscultation, normal effort, cough  Heart: Regular rate and rhythm  Abdomen:  Soft, nontender  Extremities:  No peripheral edema.  Neurologic: Nonfocal, moving all four extremities  Skin: No lesions  Access: None  GU-suprapubic catheter-hematuria improving  Basic Metabolic Panel: Recent Labs  Lab 01/06/22 0523 01/07/22 0538 01/08/22 0421 01/09/22 0605 01/10/22 0553 01/11/22 0628 01/12/22 0656  NA 134* 140 142 145 149* 145 138  K 2.8* 3.1* 3.5 3.1* 3.1* 3.5 3.4*  CL 104 104 104 100 102 101 94*  CO2 21* 23 28 35* 37* 33* 32  GLUCOSE 103* 111* 167* 103* 76 165* 119*  BUN 95* 94* 96* 94* 88* 83* 78*  CREATININE 4.97* 5.08* 5.15* 5.21* 5.09* 4.83* 4.85*  CALCIUM 9.8 9.7 9.5 9.7 9.7 9.6 9.9  MG 2.2 2.4  --   --   --  2.4 2.3  PHOS  --   --   --   --   --  6.1* 4.9*     Liver Function Tests: No results for input(s): "AST", "ALT", "ALKPHOS", "BILITOT", "PROT", "ALBUMIN" in the  last 168 hours.  No results for input(s): "LIPASE", "AMYLASE" in the last 168 hours. No results for input(s): "AMMONIA" in the last 168 hours.  CBC: Recent Labs  Lab 01/08/22 0421 01/09/22 0605 01/10/22 0553 01/11/22 0628 01/12/22 0656  WBC 12.2* 16.5* 15.0* 12.2* 18.7*  HGB 8.3* 7.6* 8.1* 8.1* 8.5*  HCT 25.5* 23.4* 25.6* 26.3* 27.0*  MCV 82.0 83.3 85.6 85.4 85.7  PLT 293 261 255 265 260     Cardiac Enzymes: No results for input(s): "CKTOTAL", "CKMB", "CKMBINDEX", "TROPONINI" in the last 168 hours.  BNP: Invalid input(s): "POCBNP"  CBG: No results for input(s): "GLUCAP" in the last 168 hours.   Microbiology: Results for orders placed or performed during the hospital encounter of 01/03/22  Urine Culture     Status: None   Collection Time: 01/05/22  8:56 AM   Specimen: Urine, Suprapubic  Result Value Ref Range Status   Specimen Description   Final    URINE, SUPRAPUBIC [facility] [address], [place] [address]   Special Requests   Final    NONE [facility] at [facility] [address] [place] [address]   Culture   Final    NO GROWTH [facility]formed at [facility] [address] [place]  [id number]   Report Status 01/06/2022 FINAL  Final    Coagulation Studies: No results for input(s): "LABPROT", "INR" in the last 72 hours.  Urinalysis: No results for input(s): "COLORURINE", "LABSPEC", "PHURINE", "GLUCOSEU", "HGBUR", "BILIRUBINUR", "KETONESUR", "PROTEINUR", "UROBILINOGEN", "NITRITE", "LEUKOCYTESUR" in the last 72 hours.  Invalid input(s): "APPERANCEUR"    Imaging: No results found.   Medications:    sodium chloride     ceFEPime (MAXIPIME) IV Stopped (01/11/22 2022)   dextrose 75 mL/hr at 01/12/22 0839    carvedilol  25 mg Oral BID WC   feeding supplement  1 Container Oral TID BM   multivitamin with minerals  1 tablet Oral Daily   polyethylene glycol  17 g Oral BID   sodium  chloride flush  3 mL Intravenous Q12H   sodium chloride, acetaminophen **OR** acetaminophen, haloperidol lactate, metoprolol tartrate, morphine injection, sodium chloride flush  Assessment/ Plan:  Troy Berry. is a 76 y.o.  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 Creatinine stable today, nursing notes indication catheter required flushing due to clots. Decreased urine output noted. Agree with IVF. Will continue to monitor renal function. No acute need for dialysis at this time.   Lab Results  Component Value Date   CREATININE 4.85 (H) 01/12/2022   CREATININE 4.83 (H) 01/11/2022   CREATININE 5.09 (H) 01/10/2022    Intake/Output Summary (Last 24 hours) at 01/12/2022 1143 Last data filed at 01/12/2022 1100 Gross per 24 hour  Intake 2474.32 ml  Output 705 ml  Net 1769.32 ml    Hypernatremia likely due to decreased oral intake. 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 Improving   Small bowel obstruction Status post exploratory laparotomy on 01/07/2022. Experienced wound dehiscence with repair in OR on 01/10/22.   LOS: [place] 10/8/202311:43 AM

## 2022-01-12 NOTE — Progress Notes (Signed)
Patient ID: Troy Beals., male   DOB: 1946-03-07, 76 y.o.   MRN: [id number]     [facility] Day(s): 8.   Interval History: Patient seen and examined, no acute events or new complaints overnight.  No issues reported by nurse.  There has been no bowel movement.  There have been no nausea or vomiting.  Vital signs in last 24 hours: [min-max] current  Temp:  [97.7 F (36.5 C)-98.5 F (36.9 C)] 98.1 F (36.7 C) (10/08 0746) Pulse Rate:  [59-88] 88 (10/08 0746) Resp:  [17-22] 22 (10/08 0746) BP: (139-159)/(52-71) 154/52 (10/08 0746) SpO2:  [93 %-100 %] 97 % (10/08 0746) Weight:  [66 kg] 66 kg (10/08 0500)     Height: '5\' 9"'$  (175.3 cm) Weight: 66 kg BMI (Calculated): 21.48   Physical Exam:  Constitutional: No distress  Respiratory: breathing non-labored at rest  Cardiovascular: regular rate and sinus rhythm  Gastrointestinal: soft, non-tender, and non-distended.  The wound is dry and clean  Labs:     Latest Ref Rng & Units 01/12/2022    6:56 AM 01/11/2022    6:28 AM 01/10/2022    5:53 AM  CBC  WBC 4.0 - 10.5 K/uL 18.7  12.2  15.0   Hemoglobin 13.0 - 17.0 g/dL 8.5  8.1  8.1   Hematocrit 39.0 - 52.0 % 27.0  26.3  25.6   Platelets 150 - 400 K/uL 260  265  255       Latest Ref Rng & Units 01/12/2022    6:56 AM 01/11/2022    6:28 AM 01/10/2022    5:53 AM  CMP  Glucose 70 - 99 mg/dL 119  165  76   BUN 8 - 23 mg/dL 78  83  88   Creatinine 0.61 - 1.24 mg/dL 4.85  4.83  5.09   Sodium 135 - 145 mmol/L 138  145  149   Potassium 3.5 - 5.1 mmol/L 3.4  3.5  3.1   Chloride 98 - 111 mmol/L 94  101  102   CO2 22 - 32 mmol/L 32  33  37   Calcium 8.9 - 10.3 mg/dL 9.9  9.6  9.7     Imaging studies: No new pertinent imaging studies   Assessment/Plan:  76 y.o. male with small bowel obstruction 5 Day Post-Op s/p reduction of internal hernia.  The was complicated by midline wound dehiscence status post wound closure postop day #2.  -Patient with adequate vital signs,  no fever -Patient seems comfortable laying down in bed -Abdominal physical exam is benign.  No distention. -There has been no complaint or sign of complications -I will increase the frequency of the MiraLAX -Waiting for return of bowel function to advance diet -Management of suprapubic catheter by urology -Continue medical management by hospitalist and nephrology -I will continue to follow closely.  Arnold Long, MD

## 2022-01-13 ENCOUNTER — Inpatient Hospital Stay: Payer: Medicare HMO

## 2022-01-13 DIAGNOSIS — F0390 Unspecified dementia without behavioral disturbance: Secondary | ICD-10-CM | POA: Diagnosis not present

## 2022-01-13 DIAGNOSIS — K56609 Unspecified intestinal obstruction, unspecified as to partial versus complete obstruction: Secondary | ICD-10-CM | POA: Diagnosis not present

## 2022-01-13 DIAGNOSIS — N184 Chronic kidney disease, stage 4 (severe): Secondary | ICD-10-CM | POA: Diagnosis not present

## 2022-01-13 DIAGNOSIS — N179 Acute kidney failure, unspecified: Secondary | ICD-10-CM | POA: Diagnosis not present

## 2022-01-13 LAB — CBC
HCT: 24.8 % — ABNORMAL LOW (ref 39.0–52.0)
Hemoglobin: 7.9 g/dL — ABNORMAL LOW (ref 13.0–17.0)
MCH: 26.9 pg (ref 26.0–34.0)
MCHC: 31.9 g/dL (ref 30.0–36.0)
MCV: 84.4 fL (ref 80.0–100.0)
Platelets: 221 10*3/uL (ref 150–400)
RBC: 2.94 MIL/uL — ABNORMAL LOW (ref 4.22–5.81)
RDW: 15.4 % (ref 11.5–15.5)
WBC: 12.2 10*3/uL — ABNORMAL HIGH (ref 4.0–10.5)
nRBC: 0 % (ref 0.0–0.2)

## 2022-01-13 LAB — BASIC METABOLIC PANEL
Anion gap: 12 (ref 5–15)
BUN: 73 mg/dL — ABNORMAL HIGH (ref 8–23)
CO2: 29 mmol/L (ref 22–32)
Calcium: 9.5 mg/dL (ref 8.9–10.3)
Chloride: 97 mmol/L — ABNORMAL LOW (ref 98–111)
Creatinine, Ser: 4.97 mg/dL — ABNORMAL HIGH (ref 0.61–1.24)
GFR, Estimated: 11 mL/min — ABNORMAL LOW (ref >60)
Glucose, Bld: 101 mg/dL — ABNORMAL HIGH (ref 70–99)
Potassium: 3.2 mmol/L — ABNORMAL LOW (ref 3.5–5.1)
Sodium: 138 mmol/L (ref 135–145)

## 2022-01-13 LAB — PHOSPHORUS: Phosphorus: 5 mg/dL — ABNORMAL HIGH (ref 2.5–4.6)

## 2022-01-13 LAB — MAGNESIUM: Magnesium: 2.3 mg/dL (ref 1.7–2.4)

## 2022-01-13 MED ORDER — HYDRALAZINE HCL 20 MG/ML IJ SOLN
10.0000 mg | Freq: Four times a day (QID) | INTRAMUSCULAR | Status: DC | PRN
  Administered 2022-01-17 – 2022-01-21 (×2): 10 mg via INTRAVENOUS
  Filled 2022-01-13 (×2): qty 1

## 2022-01-13 MED ORDER — IOHEXOL 9 MG/ML PO SOLN
500.0000 mL | Freq: Once | ORAL | Status: DC | PRN
  Administered 2022-01-13: 500 mL via ORAL

## 2022-01-13 MED ORDER — AMLODIPINE BESYLATE 10 MG PO TABS
10.0000 mg | ORAL_TABLET | Freq: Every day | ORAL | Status: DC
  Administered 2022-01-14: 10 mg via ORAL
  Filled 2022-01-13: qty 1

## 2022-01-13 NOTE — Progress Notes (Signed)
[facility]  ROUNDING NOTE   Subjective:   Patient seen resting quietly, wife at bedside Patient remains drowsy, opens his eyes to name Remains on room air Intermittently attempts to get out of bed   Creatinine 4.97 UOP 1.2L via suprapubic catheter   Objective:  Vital signs in last 24 hours:  Temp:  [97.3 F (36.3 C)-98.7 F (37.1 C)] 98.7 F (37.1 C) (10/09 1001) Pulse Rate:  [53-94] 94 (10/09 1001) Resp:  [17-20] 17 (10/09 1001) BP: (164-174)/(56-89) 165/71 (10/09 1001) SpO2:  [91 %-100 %] 91 % (10/09 1001) Weight:  [65.9 kg] 65.9 kg (10/09 0458)  Weight change: -0.1 kg Filed Weights   01/11/22 0603 01/12/22 0500 01/13/22 0458  Weight: 64.5 kg 66 kg 65.9 kg    Intake/Output: I/O last 3 completed shifts: In: 3598.8 [P.O.:1035; I.V.:2363.8; IV Piggyback:200] Out: 1480 [Urine:1480]   Intake/Output this shift:  No intake/output data recorded.  Physical Exam: General: NAD  Head: Normocephalic, atraumatic.   Eyes: Anicteric  Lungs:  Clear to auscultation, normal effort  Heart: Regular rate and rhythm  Abdomen:  Soft, nontender  Extremities:  No peripheral edema.  Neurologic: Nonfocal, moving all four extremities  Skin: No lesions  Access: None  GU-suprapubic catheter-hematuria improving  Basic Metabolic Panel: Recent Labs  Lab 01/07/22 0538 01/08/22 0421 01/09/22 1962 01/10/22 0553 01/11/22 0628 01/12/22 0656 01/13/22 0528  NA 140   < > 145 149* 145 138 138  K 3.1*   < > 3.1* 3.1* 3.5 3.4* 3.2*  CL 104   < > 100 102 101 94* 97*  CO2 23   < > 35* 37* 33* 32 29  GLUCOSE 111*   < > 103* 76 165* 119* 101*  BUN 94*   < > 94* 88* 83* 78* 73*  CREATININE 5.08*   < > 5.21* 5.09* 4.83* 4.85* 4.97*  CALCIUM 9.7   < > 9.7 9.7 9.6 9.9 9.5  MG 2.4  --   --   --  2.4 2.3 2.3  PHOS  --   --   --   --  6.1* 4.9* 5.0*   < > = values in this interval not displayed.     Liver Function Tests: No results for input(s): "AST", "ALT", "ALKPHOS",  "BILITOT", "PROT", "ALBUMIN" in the last 168 hours.  No results for input(s): "LIPASE", "AMYLASE" in the last 168 hours. No results for input(s): "AMMONIA" in the last 168 hours.  CBC: Recent Labs  Lab 01/09/22 0605 01/10/22 0553 01/11/22 0628 01/12/22 0656 01/13/22 0528  WBC 16.5* 15.0* 12.2* 18.7* 12.2*  HGB 7.6* 8.1* 8.1* 8.5* 7.9*  HCT 23.4* 25.6* 26.3* 27.0* 24.8*  MCV 83.3 85.6 85.4 85.7 84.4  PLT 261 255 265 260 221     Cardiac Enzymes: No results for input(s): "CKTOTAL", "CKMB", "CKMBINDEX", "TROPONINI" in the last 168 hours.  BNP: Invalid input(s): "POCBNP"  CBG: No results for input(s): "GLUCAP" in the last 168 hours.   Microbiology: Results for orders placed or performed during the hospital encounter of 01/03/22  Urine Culture     Status: None   Collection Time: 01/05/22  8:56 AM   Specimen: Urine, Suprapubic  Result Value Ref Range Status   Specimen Description   Final    URINE, SUPRAPUBIC Performed at [facility], [address], [place], [place] [address]    Special Requests   Final    NONE Performed at [facility], [address], [place], [place] [address]  Culture   Final    NO GROWTH Performed at [facility], [place] [address] [place], [place] [address]    Report Status 01/06/2022 FINAL  Final    Coagulation Studies: No results for input(s): "LABPROT", "INR" in the last 72 hours.  Urinalysis: No results for input(s): "COLORURINE", "LABSPEC", "PHURINE", "GLUCOSEU", "HGBUR", "BILIRUBINUR", "KETONESUR", "PROTEINUR", "UROBILINOGEN", "NITRITE", "LEUKOCYTESUR" in the last 72 hours.  Invalid input(s): "APPERANCEUR"    Imaging: CT ABDOMEN PELVIS WO CONTRAST  Result Date: 01/13/2022 CLINICAL DATA:  Abdominal pain, postop bowel obstruction suspected. Small-bowel obstruction, 5 day postop status post reduction of internal hernia, complicated by midline wound dehiscence status post wound closure postop day 2. EXAM: CT  ABDOMEN AND PELVIS WITHOUT CONTRAST TECHNIQUE: Multidetector CT imaging of the abdomen and pelvis was performed following the standard protocol without IV contrast. RADIATION DOSE REDUCTION: This exam was performed according to the departmental dose-optimization program which includes automated exposure control, adjustment of the mA and/or kV according to patient size and/or use of iterative reconstruction technique. COMPARISON:  CT abdomen and pelvis dated 01/03/2022 and CT cystogram dated 01/03/2022. FINDINGS: Lower chest: Small bilateral pleural effusions. Bibasilar atelectasis. Dense coronary artery calcifications at the heart base. Hepatobiliary: No focal liver abnormality is seen. Multiple calcified gallstones, as previously described. Gallbladder moderately distended. No bile duct dilatation is seen. Pancreas: Unremarkable. No pancreatic ductal dilatation or surrounding inflammatory changes. Spleen: Normal in size without focal abnormality. Adrenals/Urinary Tract: Suprapubic catheter appears appropriately positioned within the bladder. 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 As also previously described, there is hyperdense material within the upper pole moiety of the LEFT renal pelvis, not significantly changed compared to the recent CTs of 01/03/2022 but new compared to an earlier CT of 04/27/2019. Bilateral nonobstructing renal stones. Bilateral renal cysts, as previously described. No follow-up imaging is recommended for these benign findings. No acute findings of either kidney. No ureteral or bladder stone is seen. Stomach/Bowel: Mildly distended fluid-filled small bowel loops within the LEFT abdomen and upper central abdomen, with some associated air-fluid levels which are nonspecific in configuration. Large bowel is of normal caliber. Diverticulosis is seen throughout the colon. Moderate-sized stool ball in  the rectal vault. Appendix is normal. Stomach is moderately distended with fluid and associated air-fluid levels. Vascular/Lymphatic: Extensive aortic atherosclerosis. No abdominal aortic aneurysm. No enlarged lymph nodes are seen. Reproductive: Prostate is enlarged causing mass effect on the bladder base, not significantly changed compared to the older study of 04/27/2019. Other: Small amount of free fluid in the abdomen and pelvis. No evidence of abscess collection. No evidence of free intraperitoneal air. Musculoskeletal: Degenerative spondylosis of the thoracolumbar spine, moderate to severe in degree. No acute-appearing osseous abnormality. Fluid distends the RIGHT inguinal canal, increased compared to the earlier studies of 01/03/2022. Small focus of air within the RIGHT inguinal canal, of uncertain significance, possibly related to a recent procedural intervention IMPRESSION: 1. Mildly distended fluid-filled small bowel loops within the LEFT abdomen and upper central abdomen, with some associated air-fluid levels which are nonspecific in configuration. Differential considerations include adynamic ileus versus a partial small bowel obstruction, favor ileus. 2. Suprapubic catheter appears appropriately positioned within the bladder. 3. Hyperdense material within the upper pole moiety of the LEFT renal pelvis, not significantly changed compared to the recent CTS of 01/03/2022. As previously suggested, this could represent sequela of a previous/recent hemorrhage or excreted contrast material from a prior exam. Neoplastic  process is considered less likely. Recommend follow-up renal protocol CT in 3 months as also recommended on the initial CT report of 01/03/2022. 4. Small bilateral pleural effusions. 5. Small amount of free fluid in the abdomen and pelvis. No evidence of abscess collection. No evidence of free intraperitoneal air. 6. Fluid distends the RIGHT inguinal canal, increased compared to the earlier  studies of 01/03/2022, with associated punctate focus of air and with wall thickening/inflammation. This is of uncertain significance, possibly a concomitant vasitis or sequela of interval procedural intervention including repositioning of the suprapubic catheter. Any pain or superficial signs of infection at the RIGHT inguinal region? If so, consider directed ultrasound for further characterization. 7. Colonic diverticulosis without evidence of acute diverticulitis. 8. Cholelithiasis without evidence of acute cholecystitis. Electronically Signed   By: Franki Cabot M.D.   On: 01/13/2022 11:34     Medications:    sodium chloride     dextrose 75 mL/hr at 01/13/22 0500    amLODipine  10 mg Oral Daily   carvedilol  25 mg Oral BID WC   feeding supplement  1 Container Oral TID BM   multivitamin with minerals  1 tablet Oral Daily   polyethylene glycol  17 g Oral BID   sodium chloride flush  3 mL Intravenous Q12H   sodium chloride, acetaminophen **OR** acetaminophen, haloperidol lactate, hydrALAZINE, iohexol, metoprolol tartrate, morphine injection, sodium chloride flush  Assessment/ Plan:  [name] is a 76 y.o.  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 Creatinine remains elevated but urine output remains adequate. Oral intake acceptable. Will continue to monitor. No acute need for dialysis at this time.   Lab Results  Component Value Date   CREATININE 4.97 (H) 01/13/2022   CREATININE 4.85 (H) 01/12/2022   CREATININE 4.83 (H) 01/11/2022    Intake/Output Summary (Last 24 hours) at 01/13/2022 1452 Last data filed at 01/13/2022 0500 Gross per 24 hour  Intake 1251.57 ml  Output 950 ml   Net 301.57 ml    Hypernatremia likely due to decreased oral intake. 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 Improving   Small bowel obstruction Status post exploratory laparotomy on 01/07/2022. Experienced wound dehiscence with repair in OR on 01/10/22.   LOS: [place] 10/9/20232:52 PM

## 2022-01-13 NOTE — Progress Notes (Signed)
Physical Therapy Treatment Patient Details Name: Dhillon Comunale. MRN: [id number] DOB: 1946-01-28 Today's Date: 01/13/2022   History of Present Illness Patient is a 76 y.o.  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 He was admitted on 01/03/2022 for Acute on chronic renal failure, small bowel obstruction  s/p reduction of internal hernia. patient with midline wound dehiscence status post wound closure on 10/6.   PT Comments    The patient needs increased assistance today compared to previous session. He appears restless at times in the bed and is trying to sit up. Assisted patient to the edge of bed with cues for logroll technique to protect abdominal incision. He required intermittent external support to maintain sitting balance. Patient has eyes opened, following single step commands intermittently but essentially is non verbal during this session. Standing not attempted, however anticipate patient might need + 2 person assistance at this time. Recommend to continue PT to maximize independence and decrease caregiver burden. SNF recommended at discharge.    Recommendations for follow up therapy are one component of a multi-disciplinary discharge planning process, led by the attending physician.  Recommendations may be updated based on patient status, additional functional criteria and insurance authorization.  Follow Up Recommendations  Skilled nursing-short term rehab (<3 hours/day) Can patient physically be transported by private vehicle: No   Assistance Recommended at Discharge Frequent or constant Supervision/Assistance  Patient can return home with the following Two people to help with walking and/or transfers;A lot of help with bathing/dressing/bathroom;Assist for transportation   Equipment Recommendations  None recommended by PT    Recommendations for Other Services        Precautions / Restrictions Precautions Precautions: Fall (abdominal incision) Restrictions Weight Bearing Restrictions: No     Mobility  Bed Mobility Overal bed mobility: Needs Assistance Bed Mobility: Rolling, Supine to Sit, Sit to Supine Rolling: Max assist   Supine to sit: Max assist Sit to supine: Max assist, +2 for physical assistance   General bed mobility comments: patient has difficulty following commands for logroll technique. assistance required for trunk and BLE support to sit upright with increased assistance required for return to bed. increased time and effort provided    Transfers                   General transfer comment: not attempted due to poor participation with seated activity    Ambulation/Gait                   Stairs             Wheelchair Mobility    Modified Rankin (Stroke Patients Only)       Balance Overall balance assessment: Needs assistance Sitting-balance support: Feet supported Sitting balance-[name]: Poor Sitting balance - Comments: external support required to maintain sitting balance with intermittent posterior lean Postural control: Posterior lean                                  Cognition Arousal/Alertness: Awake/alert Behavior During Therapy: Flat affect Overall Cognitive Status: Impaired/Different from baseline                                 General Comments: patient was essentially non verbal throughout the session. he was able to follow single step commands  intermittently with increased time        Exercises      General Comments General comments (skin integrity, edema, etc.): surgery MD cleared patient for mobility/PT following abdominal surgery on 10/6      Pertinent Vitals/Pain Pain Assessment Pain Assessment: Faces Faces Pain Scale: Hurts little more Pain Location: abdomen Pain Descriptors / Indicators: Grimacing Pain Intervention(s): Limited  activity within patient's tolerance, Monitored during session    Home Living                          Prior Function            PT Goals (current goals can now be found in the care plan section) Acute Rehab PT Goals Patient Stated Goal: patient unable to particiapte with goal setting today PT Goal Formulation: With family Time For Goal Achievement: 01/24/22 Potential to Achieve Goals: Fair Progress towards PT goals: Progressing toward goals    Frequency    Min 2X/week      PT Plan Current plan remains appropriate    Co-evaluation              AM-PAC PT "6 Clicks" Mobility   Outcome Measure  Help needed turning from your back to your side while in a flat bed without using bedrails?: A Lot Help needed moving from lying on your back to sitting on the side of a flat bed without using bedrails?: A Lot Help needed moving to and from a bed to a chair (including a wheelchair)?: Total Help needed standing up from a chair using your arms (e.g., wheelchair or bedside chair)?: Total Help needed to walk in hospital room?: Total Help needed climbing 3-5 steps with a railing? : Total 6 Click Score: 8    End of Session   Activity Tolerance: Patient tolerated treatment well Patient left: in bed;with call bell/phone within reach;with bed alarm set Nurse Communication: Mobility status PT Visit Diagnosis: Unsteadiness on feet (R26.81);Muscle weakness (generalized) (M62.81)     Time: 0370-4888 PT Time Calculation (min) (ACUTE ONLY): 17 min  Charges:  $Therapeutic Activity: 8-22 mins                     Minna Merritts, PT, MPT    Percell Locus 01/13/2022, 2:39 PM

## 2022-01-13 NOTE — TOC Progression Note (Signed)
Transition of Care (TOC) - Progression Note    Patient Details  Name: Troy Berry. MRN: [id number] Date of Birth: July 11, 1945  [facility]) CM/SW Contact  Beverly Sessions, RN Phone Number: 01/13/2022, 5:31 PM  Clinical Narrative:    No bed offers. Search extended.    Expected Discharge Plan:  (TBD) Barriers to Discharge: Continued Medical Work up  Expected Discharge Plan and Services Expected Discharge Plan:  (TBD)     Post Acute Care Choice:  (TBD) Living arrangements for the past 2 months: Single Family Home                                       Social Determinants of Health (SDOH) Interventions Transportation Interventions: Inpatient TOC, Other (Comment) (Transportation provided through [employer])  Readmission Risk Interventions     No data to display

## 2022-01-13 NOTE — Progress Notes (Signed)
PROGRESS NOTE    Troy Berry.  WFU:[id number] DOB: Oct 19, 1945 DOA: 01/03/2022 PCP: [facility]   Assessment & Plan:   Principal Problem:   Acute renal failure superimposed on stage 4 chronic kidney disease ([place]) Active Problems:   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HCC)   SBO (small bowel obstruction) (HCC)   Anemia   Hematuria   Urinary tract infection   Metabolic acidosis   Essential hypertension   Gross hematuria   Dementia without behavioral disturbance (HCC)   Hypokalemia   Acute blood loss anemia  Assessment and Plan: AKI on CKDIV: Cr is trending up again today. Likely secondary to ATN due concurrent illness & SBO as per nephro. Nephro following and recs apprec  Suprapubic catheter dysfunction: w/ hx of atonic bladder requiring suprapubic catheter placement approx on 12/05/21. On 12/30/2021, catheter was replaced by IR.  Since that time, patient has been experiencing difficulties including hematuria and poor urinary drainage.  He was reevaluated by urology and IR on 9/27 without any improvement.  On admission, it was discovered that suprapubic catheter tip was not in the bladder but adjacent to the small bowel. Suprapubic catheter replaced with cystogram demonstrating appropriate placement within bladder as per urology   SBO: s/p ex lap & reduction of hernia on 01/07/22 as per gen surg. S/p re-opening of recent laparotomy on 01/10/22 as wound dehiscence & exposed omentum as per gen surg. Diet as per gen surg recs. Repeat CT abd/pelvis today and see report for more information    ACD: secondary to CKD & hematuria. Will transfuse if Hb < 7.0    Hematuria: improved. H&H are labile    UTI: secondary to obstructive uropathy. Completed abx course    Hyponatremia: resolved  Hypernatremia: resolved    Metabolic acidosis: resolved   HTN: continue on coreg, amlodipine.   Hypokalemia: will hold off replacing potassium as Cr is elevated    Dementia: w/o  behavioral disturbance. Continue w/ supportive care       DVT prophylaxis: SCDs Code Status: full  Family Communication: discussed pt's care w/ pt's wife at bedside and answered her questions  Disposition Plan: possibly d/c to SNF  Level of care: Med-Surg  Status is: Inpatient  Remains inpatient appropriate because: severity of illness  Consultants:  Nephro General surg   Procedures:   Antimicrobials:  Subjective: Pt does not answer any of my questions   Objective: Vitals:   01/12/22 1919 01/12/22 2000 01/13/22 0248 01/13/22 0458  BP: (!) 164/89  (!) 174/58   Pulse: 71  (!) 53   Resp: 18  18   Temp: (!) 97.3 F (36.3 C) 97.8 F (36.6 C) (!) 97.3 F (36.3 C) 97.6 F (36.4 C)  TempSrc:  Axillary  Axillary  SpO2: 100%  97%   Weight:    65.9 kg  Height:        Intake/Output Summary (Last 24 hours) at 01/13/2022 0752 Last data filed at 01/13/2022 0500 Gross per 24 hour  Intake 2005.37 ml  Output 1230 ml  Net 775.37 ml   Filed Weights   01/11/22 0603 01/12/22 0500 01/13/22 0458  Weight: 64.5 kg 66 kg 65.9 kg    Examination:  General exam: Appears calm  Respiratory system: clear breath sounds b/l  Cardiovascular system: S1/S2+. No rubs or clicks  Gastrointestinal system: abd is soft, NT, ND & hypoactive bowel sounds  Central nervous system: Alert and awake Psychiatry: judgement and insight appears poor. Flat mood and affect  Data Reviewed: I have personally reviewed following labs and imaging studies  CBC: Recent Labs  Lab 01/09/22 0605 01/10/22 0553 01/11/22 0628 01/12/22 0656 01/13/22 0528  WBC 16.5* 15.0* 12.2* 18.7* 12.2*  HGB 7.6* 8.1* 8.1* 8.5* 7.9*  HCT 23.4* 25.6* 26.3* 27.0* 24.8*  MCV 83.3 85.6 85.4 85.7 84.4  PLT 261 255 265 260 536   Basic Metabolic Panel: Recent Labs  Lab 01/07/22 0538 01/08/22 0421 01/09/22 0605 01/10/22 0553 01/11/22 0628 01/12/22 0656 01/13/22 0528  NA 140   < > 145 149* 145 138 138  K 3.1*   <  > 3.1* 3.1* 3.5 3.4* 3.2*  CL 104   < > 100 102 101 94* 97*  CO2 23   < > 35* 37* 33* 32 29  GLUCOSE 111*   < > 103* 76 165* 119* 101*  BUN 94*   < > 94* 88* 83* 78* 73*  CREATININE 5.08*   < > 5.21* 5.09* 4.83* 4.85* 4.97*  CALCIUM 9.7   < > 9.7 9.7 9.6 9.9 9.5  MG 2.4  --   --   --  2.4 2.3 2.3  PHOS  --   --   --   --  6.1* 4.9* 5.0*   < > = values in this interval not displayed.   GFR: Estimated Creatinine Clearance: 11.6 mL/min (A) (by C-G formula based on SCr of 4.97 mg/dL (H)). Liver Function Tests: No results for input(s): "AST", "ALT", "ALKPHOS", "BILITOT", "PROT", "ALBUMIN" in the last 168 hours.  No results for input(s): "LIPASE", "AMYLASE" in the last 168 hours. No results for input(s): "AMMONIA" in the last 168 hours. Coagulation Profile: No results for input(s): "INR", "PROTIME" in the last 168 hours. Cardiac Enzymes: No results for input(s): "CKTOTAL", "CKMB", "CKMBINDEX", "TROPONINI" in the last 168 hours. BNP (last 3 results) No results for input(s): "PROBNP" in the last 8760 hours. HbA1C: No results for input(s): "HGBA1C" in the last 72 hours. CBG: No results for input(s): "GLUCAP" in the last 168 hours.  Lipid Profile: No results for input(s): "CHOL", "HDL", "LDLCALC", "TRIG", "CHOLHDL", "LDLDIRECT" in the last 72 hours. Thyroid Function Tests: No results for input(s): "TSH", "T4TOTAL", "FREET4", "T3FREE", "THYROIDAB" in the last 72 hours. Anemia Panel: No results for input(s): "VITAMINB12", "FOLATE", "FERRITIN", "TIBC", "IRON", "RETICCTPCT" in the last 72 hours. Sepsis Labs: Recent Labs  Lab 01/07/22 0538 01/08/22 0421  PROCALCITON 8.66 6.11    Recent Results (from the past 240 hour(s))  Urine Culture     Status: None   Collection Time: 01/05/22  8:56 AM   Specimen: Urine, Suprapubic  Result Value Ref Range Status   Specimen Description   Final    URINE, SUPRAPUBIC Performed at [facility], [address] [place], [place] [address]     Special Requests   Final    NONE Performed at [facility], [address], [place], [place] [address]    Culture   Final    NO GROWTH Performed at [facility], [place] [address], [place], [place] [address]    Report Status 01/06/2022 FINAL  Final         Radiology Studies: No results found.      Scheduled Meds:  carvedilol  25 mg Oral BID WC   feeding supplement  1 Container Oral TID BM   multivitamin with minerals  1 tablet Oral Daily   polyethylene glycol  17 g Oral BID   sodium chloride flush  3 mL  Intravenous Q12H   Continuous Infusions:  sodium chloride     dextrose 75 mL/hr at 01/13/22 0500     LOS: 9 days    Time spent: 27 mins     Wyvonnia Dusky, MD [facility] Pager [phone]  If 7PM-7AM, please contact night-coverage [email or web address] 01/13/2022, 7:52 AM

## 2022-01-13 NOTE — Progress Notes (Signed)
Patient ID: Troy Berry., male   DOB: September 23, 1945, 76 y.o.   MRN: [id number]     [place] Hospital Day(s): 9.   Interval History: Patient seen and examined, no acute events or new complaints overnight.  Found lying comfortable in bed.  Today with abdominal distention.  Wife endorse that he passed gas.  Vital signs in last 24 hours: [min-max] current  Temp:  [97.3 F (36.3 C)-98.7 F (37.1 C)] 98.7 F (37.1 C) (10/09 1912) Pulse Rate:  [53-94] 80 (10/09 1912) Resp:  [16-18] 16 (10/09 1912) BP: (149-174)/(58-81) 158/81 (10/09 1912) SpO2:  [91 %-100 %] 100 % (10/09 1912) Weight:  [65.9 kg] 65.9 kg (10/09 0458)     Height: '5\' 9"'$  (175.3 cm) Weight: 65.9 kg BMI (Calculated): 21.44   Physical Exam:  Constitutional: alert, cooperative  Respiratory: breathing non-labored at rest  Cardiovascular: regular rate and sinus rhythm  Gastrointestinal: soft, non-tender, and distended  Labs:     Latest Ref Rng & Units 01/13/2022    5:28 AM 01/12/2022    6:56 AM 01/11/2022    6:28 AM  CBC  WBC 4.0 - 10.5 K/uL 12.2  18.7  12.2   Hemoglobin 13.0 - 17.0 g/dL 7.9  8.5  8.1   Hematocrit 39.0 - 52.0 % 24.8  27.0  26.3   Platelets 150 - 400 K/uL 221  260  265       Latest Ref Rng & Units 01/13/2022    5:28 AM 01/12/2022    6:56 AM 01/11/2022    6:28 AM  CMP  Glucose 70 - 99 mg/dL 101  119  165   BUN 8 - 23 mg/dL 73  78  83   Creatinine 0.61 - 1.24 mg/dL 4.97  4.85  4.83   Sodium 135 - 145 mmol/L 138  138  145   Potassium 3.5 - 5.1 mmol/L 3.2  3.4  3.5   Chloride 98 - 111 mmol/L 97  94  101   CO2 22 - 32 mmol/L 29  32  33   Calcium 8.9 - 10.3 mg/dL 9.5  9.9  9.6     Imaging studies: No new pertinent imaging studies   Assessment/Plan:  76 y.o. male with small bowel obstruction 6 Day Post-Op s/p reduction of internal hernia.  The was complicated by midline wound dehiscence status post wound closure postop day #3.   -Patient with adequate vital signs, no fever -Patient  seems comfortable laying down in bed -Today found with abdominal distention.  CT scan of the abdomen and pelvis shows small bowel dilation without transition point.  This is consistent with ileus.  No sign of obstruction.  The hernia with fluid and gas most likely from previous surgical intervention -We will continue n.p.o. until the abdominal distention improves  Arnold Long, MD

## 2022-01-13 NOTE — Progress Notes (Signed)
Suprapubic catheter flushed with 55m of NS as ordered. Urine pink tinged with bloody sediment and very small clots. No resistance met, patient tolerated procedure well, and I&O documented.

## 2022-01-14 ENCOUNTER — Inpatient Hospital Stay: Payer: Self-pay

## 2022-01-14 ENCOUNTER — Inpatient Hospital Stay: Payer: Medicare HMO

## 2022-01-14 DIAGNOSIS — F0390 Unspecified dementia without behavioral disturbance: Secondary | ICD-10-CM | POA: Diagnosis not present

## 2022-01-14 DIAGNOSIS — N184 Chronic kidney disease, stage 4 (severe): Secondary | ICD-10-CM | POA: Diagnosis not present

## 2022-01-14 DIAGNOSIS — N179 Acute kidney failure, unspecified: Secondary | ICD-10-CM | POA: Diagnosis not present

## 2022-01-14 DIAGNOSIS — E43 Unspecified severe protein-calorie malnutrition: Secondary | ICD-10-CM | POA: Diagnosis present

## 2022-01-14 DIAGNOSIS — K56609 Unspecified intestinal obstruction, unspecified as to partial versus complete obstruction: Secondary | ICD-10-CM | POA: Diagnosis not present

## 2022-01-14 LAB — CBC
HCT: 27.3 % — ABNORMAL LOW (ref 39.0–52.0)
Hemoglobin: 8.8 g/dL — ABNORMAL LOW (ref 13.0–17.0)
MCH: 27 pg (ref 26.0–34.0)
MCHC: 32.2 g/dL (ref 30.0–36.0)
MCV: 83.7 fL (ref 80.0–100.0)
Platelets: 279 10*3/uL (ref 150–400)
RBC: 3.26 MIL/uL — ABNORMAL LOW (ref 4.22–5.81)
RDW: 14.9 % (ref 11.5–15.5)
WBC: 14.5 10*3/uL — ABNORMAL HIGH (ref 4.0–10.5)
nRBC: 0 % (ref 0.0–0.2)

## 2022-01-14 LAB — BASIC METABOLIC PANEL
Anion gap: 10 (ref 5–15)
BUN: 66 mg/dL — ABNORMAL HIGH (ref 8–23)
CO2: 28 mmol/L (ref 22–32)
Calcium: 9.5 mg/dL (ref 8.9–10.3)
Chloride: 96 mmol/L — ABNORMAL LOW (ref 98–111)
Creatinine, Ser: 4.53 mg/dL — ABNORMAL HIGH (ref 0.61–1.24)
GFR, Estimated: 13 mL/min — ABNORMAL LOW (ref >60)
Glucose, Bld: 108 mg/dL — ABNORMAL HIGH (ref 70–99)
Potassium: 3.2 mmol/L — ABNORMAL LOW (ref 3.5–5.1)
Sodium: 134 mmol/L — ABNORMAL LOW (ref 135–145)

## 2022-01-14 LAB — GLUCOSE, CAPILLARY: Glucose-Capillary: 107 mg/dL — ABNORMAL HIGH (ref 70–99)

## 2022-01-14 MED ORDER — POTASSIUM CHLORIDE 10 MEQ/100ML IV SOLN
10.0000 meq | INTRAVENOUS | Status: AC
  Administered 2022-01-14 (×2): 10 meq via INTRAVENOUS
  Filled 2022-01-14 (×2): qty 100

## 2022-01-14 MED ORDER — ONDANSETRON HCL 4 MG/2ML IJ SOLN
4.0000 mg | Freq: Once | INTRAMUSCULAR | Status: AC
  Administered 2022-01-14: 4 mg via INTRAVENOUS
  Filled 2022-01-14: qty 2

## 2022-01-14 MED ORDER — INSULIN ASPART 100 UNIT/ML IJ SOLN
0.0000 [IU] | Freq: Four times a day (QID) | INTRAMUSCULAR | Status: DC
  Administered 2022-01-17 – 2022-01-21 (×5): 1 [IU] via SUBCUTANEOUS
  Filled 2022-01-14 (×5): qty 1

## 2022-01-14 MED ORDER — TRAVASOL 10 % IV SOLN
INTRAVENOUS | Status: AC
  Filled 2022-01-14: qty 508.8

## 2022-01-14 MED ORDER — CHLORHEXIDINE GLUCONATE CLOTH 2 % EX PADS
6.0000 | MEDICATED_PAD | Freq: Every day | CUTANEOUS | Status: DC
  Administered 2022-01-15 – 2022-01-31 (×17): 6 via TOPICAL

## 2022-01-14 MED ORDER — DEXTROSE 5 % IV SOLN: INTRAVENOUS | Status: DC

## 2022-01-14 MED ORDER — POTASSIUM CHLORIDE 20 MEQ PO PACK: 40.0000 meq | PACK | Freq: Once | ORAL | Status: DC

## 2022-01-14 NOTE — Consult Note (Signed)
PHARMACY - TOTAL PARENTERAL NUTRITION CONSULT NOTE   Indication: Prolonged ileus  Patient Measurements: Height: '5\' 9"'$  (175.3 cm) Weight: 65.9 kg (145 lb 4.5 oz) IBW/kg (Calculated) : 70.7 TPN AdjBW (KG): 73 Body mass index is 21.45 kg/m.   Assessment: 76 y.o. male with medical history significant of atonic bladder s/p suprapubic catheter, CKD Stage 4, anemia of chronic kidney disease, hypertension, gout found with small bowel obstruction 7 Day Post-Op s/p reduction of internal hernia.  This was complicated by midline wound dehiscence status post wound closure postop day #4.     Glucose / Insulin: currrently controlled, BG<120 Electrolytes: Na 134 (L), K 3.2 (L), chloride 96 (L), Phos 5.0 (H) Renal: Scr 4.53 (baseline 2.5-3, per nephrology no HD needed), BUN 66 Hepatic: pending Intake / Output; MIVF: +1.4L GI Imaging:  -CT 10/9:  findings favor ileus versus a partial small bowel obstruction, GI Surgeries / Procedures:  -Ex Lap 10/6: closure of opened abdominal incision wound -Ex Lap 10/3: reduction of internal hernia   Central access: pending 10/10 TPN start date: pending 10/10  Nutritional Goals: Goal TPN rate is 80 mL/hr (provides 102 g of protein and 1962 kcals per day)  RD Assessment: Estimated Needs Total Energy Estimated Needs: 1800-2200kcal/day Total Protein Estimated Needs: 90-105g/day Total Fluid Estimated Needs: 1.7-2.0L/day  Current Nutrition:  NPO  Plan:  Start TPN at 50m/hr at 1800 Electrolytes in TPN: Na 529m/L, K 7057mL, Ca 5mE12m, Mg 5mEq74m and Phos 0mmol15m Cl:Ac 1:1 Add standard MVI and trace elements to TPN Initiate Sensitive q6h SSI and adjust as needed  Reduce MIVF to 35 mL/hr at 1800 Monitor TPN labs on Mon/Thurs, and as needed until stable  JustinAlison Murray/2023,3:04 PM

## 2022-01-14 NOTE — Progress Notes (Signed)
Dr Jimmye Norman notified of unsuccessful picc placement. IR will need to be notified tomorrow 01/15/22. I spoke with pharmacy and nursing will hold off getting CBG's and giving insulin until the TPN is able to be started.

## 2022-01-14 NOTE — Progress Notes (Addendum)
Attempted to insert PICC both upper arm,both right and left brachial vein and basilic vein ;unsuccessfully. PICC cath unable to thread at the clavicular area despite of all measures rendered.RN aware. As per recommendation for IR to do.

## 2022-01-14 NOTE — Progress Notes (Signed)
Nutrition Follow Up Note   DOCUMENTATION CODES:   Severe malnutrition in context of chronic illness  INTERVENTION:   TPN per pharmacy  Recommend thiamine 100mg  daily in TPN x 3 days  Pt at high refeed risk; recommend monitor potassium, magnesium and phosphorus labs daily until stable  Daily weights   NUTRITION DIAGNOSIS:   Severe Malnutrition related to chronic illness (dementia, CKD, internal hernia) as evidenced by severe fat depletion, severe muscle depletion. -new diagnosis   GOAL:   Patient will meet greater than or equal to 90% of their needs -not met   MONITOR:   Diet advancement, Labs, Weight trends, Skin, I & O's, Other (Comment) (TPN)  ASSESSMENT:   76 y/o male with h/o atonic bladder with chronic foley, CKD IV, dementia, HTN and gout who is admitted with SBO now s/p ex lap with reduction of internal hernia 15/7 complicated by wound dehiscence requiring reopening of recent laparotomy and closure of abdominal wound 10/6.  Met with pt and pt's wife in room today. Sitter is present in room. Pt is unable to provide any history so history was provided by wife at bedside. Pt with continued abdominal distention. Wife and sitter reports that pt vomited this morning. Pt has been burping. No BM or flatus yet. Pt with suspected ileus. Pt has remained on NPO/clear liquid diet since admission and is now without adequate nutrition for > 10 days. Discussed PICC line and TPN recommendations with pt's wife today; wife is ok to proceed with TPN initiation. Pt is at high refeed risk. Per chart, pt is down ~15lbs(10%) since admission.   Medications reviewed and include: MVI, miralax, 5% dextrose @75ml /hr  Labs reviewed: Na 134(L), K 3.2(L), BUN 66(H), creat 4.53(H) P 5.0(H), Mg 2.3(L) Wbc- 14.5(H), Hgb 8.8(L), Hct 27.3(L)  NUTRITION - FOCUSED PHYSICAL EXAM:  Flowsheet Row Most Recent Value  Orbital Region Moderate depletion  Upper Arm Region Severe depletion  Thoracic and Lumbar  Region Severe depletion  Buccal Region Moderate depletion  Temple Region Moderate depletion  Clavicle Bone Region Severe depletion  Clavicle and Acromion Bone Region Severe depletion  Scapular Bone Region Moderate depletion  Dorsal Hand Severe depletion  Patellar Region Severe depletion  Anterior Thigh Region Severe depletion  Posterior Calf Region Severe depletion  Edema (RD Assessment) None  Hair Reviewed  Eyes Reviewed  Mouth Reviewed  Skin Reviewed  Nails Reviewed   Diet Order:   Diet Order             Diet NPO time specified Except for: Sips with Meds  Diet effective now                  EDUCATION NEEDS:   Education needs have been addressed  Skin:  Skin Assessment: Reviewed RN Assessment (incision abdomen)  Last BM:  pta  Height:   Ht Readings from Last 1 Encounters:  01/03/22 5\' 9"  (1.753 m)    Weight:   Wt Readings from Last 1 Encounters:  01/13/22 65.9 kg    Ideal Body Weight:  72.7 kg  BMI:  Body mass index is 21.45 kg/m.  Estimated Nutritional Needs:   Kcal:  1800-2200kcal/day  Protein:  90-105g/day  Fluid:  1.7-2.0L/day  Koleen Distance MS, RD, LDN Please refer to [facility] for RD and/or RD on-call/weekend/after hours pager

## 2022-01-14 NOTE — Progress Notes (Signed)
Occupational Therapy Treatment Patient Details Name: Troy Berry. MRN: [id number] DOB: 10/27/1945 Today's Date: 01/14/2022   History of present illness Patient is a 76 y.o.  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 He was admitted on 01/03/2022 for Acute on chronic renal failure, small bowel obstruction  s/p reduction of internal hernia. patient with midline wound dehiscence status post wound closure on 10/6.   OT comments  Patient in bed upon arrival with family and NT present in room. Patient with his eyes closed throughout the entire session and unable to follow 1-step commands. Several spontaneous attempts of long sitting and returning to supine throughout session. Patient requires max A for grooming tasks and Max A +2 to transfer from supine <>EOB . Patient required min A to sit upright on EOB, posterior lean observed. Standing not attempted. Patient left in bed with bed alarm set, call bell in reach, and all needs met.        Recommendations for follow up therapy are one component of a multi-disciplinary discharge planning process, led by the attending physician.  Recommendations may be updated based on patient status, additional functional criteria and insurance authorization.    Follow Up Recommendations  Skilled nursing-short term rehab (<3 hours/day)    Assistance Recommended at Discharge Frequent or constant Supervision/Assistance  Patient can return home with the following  A lot of help with walking and/or transfers;A lot of help with bathing/dressing/bathroom;Assistance with feeding;Assistance with cooking/housework;Direct supervision/assist for medications management;Direct supervision/assist for financial management;Assist for transportation   Equipment Recommendations  Other (comment) (Defer to next venue of care.)    Recommendations for Other Services       Precautions / Restrictions Precautions Precautions: Fall Restrictions Weight Bearing Restrictions: No       Mobility Bed Mobility   Bed Mobility: Supine to Sit, Sit to Supine     Supine to sit: Max assist, +2 for physical assistance, +2 for safety/equipment Sit to supine: Max assist, +2 for physical assistance, +2 for safety/equipment        Transfers                         Balance Overall balance assessment: Needs assistance Sitting-balance support: Feet supported                                       ADL either performed or assessed with clinical judgement   ADL Overall ADL's : Needs assistance/impaired     Grooming: Wash/dry face;Maximal assistance                                 General ADL Comments: attempted grooming tasks while sitting EOB but pt unable to follows commands and open eyes.    Extremity/Trunk Assessment Upper Extremity Assessment Upper Extremity Assessment: Difficult to assess due to impaired cognition;Generalized weakness   Lower Extremity Assessment Lower Extremity Assessment: Generalized weakness;Difficult to assess due to impaired cognition        Vision Patient Visual Report: No change from baseline            Cognition Arousal/Alertness: Awake/alert Behavior During Therapy: Restless Overall Cognitive Status: Impaired/Different from baseline Area of Impairment: Following commands, Problem solving  Following Commands: Follows one step commands inconsistently     Problem Solving: Slow processing, Decreased initiation, Difficulty sequencing, Requires verbal cues, Requires tactile cues General Comments: Patient was essentially non verbal throughout the session. Not able to follow anysingle step commands throughout session.                   Pertinent Vitals/ Pain       Pain Assessment Pain Assessment: Faces Faces Pain Scale: Hurts little more Pain  Location: abdomen Pain Descriptors / Indicators: Grimacing Pain Intervention(s): Limited activity within patient's tolerance, Monitored during session   Frequency  Min 2X/week        Progress Toward Goals  OT Goals(current goals can now be found in the care plan section)  Progress towards OT goals: Progressing toward goals  Acute Rehab OT Goals Patient Stated Goal: to get stronger OT Goal Formulation: With family Time For Goal Achievement: 01/24/22 Potential to Achieve Goals: Good  Plan Discharge plan remains appropriate       AM-PAC OT "6 Clicks" Daily Activity     Outcome Measure   Help from another person eating meals?: A Little Help from another person taking care of personal grooming?: A Lot Help from another person toileting, which includes using toliet, bedpan, or urinal?: A Lot Help from another person bathing (including washing, rinsing, drying)?: A Lot Help from another person to put on and taking off regular upper body clothing?: A Little Help from another person to put on and taking off regular lower body clothing?: A Lot 6 Click Score: 14    End of Session Equipment Utilized During Treatment: Other (comment) (none)  OT Visit Diagnosis: Unsteadiness on feet (R26.81);Repeated falls (R29.6);Muscle weakness (generalized) (M62.81)   Activity Tolerance Treatment limited secondary to medical complications (Comment)   Patient Left in bed;with call bell/phone within reach;with bed alarm set   Nurse Communication Mobility status        Time: 1025-8527 OT Time Calculation (min): 12 min  Charges:     Tomasa Blase, OTS 01/14/2022, 3:31 PM

## 2022-01-14 NOTE — Progress Notes (Deleted)
Peripherally Inserted Central Catheter Placement  The IV Nurse has discussed with the patient and/or persons authorized to consent for the patient, the purpose of this procedure and the potential benefits and risks involved with this procedure.  The benefits include less needle sticks, lab draws from the catheter, and the patient may be discharged home with the catheter. Risks include, but not limited to, infection, bleeding, blood clot (thrombus formation), and puncture of an artery; nerve damage and irregular heartbeat and possibility to perform a PICC exchange if needed/ordered by physician.  Alternatives to this procedure were also discussed.  Bard Power PICC patient education guide, fact sheet on infection prevention and patient information card has been provided to patient /or left at bedside.    PICC Placement Documentation  PICC Double Lumen 01/14/22 Left Brachial 40 cm 0 cm (Active)  Indication for Insertion or Continuance of Line Administration of hyperosmolar/irritating solutions (i.e. TPN, Vancomycin, etc.) 01/14/22 1642  Exposed Catheter (cm) 0 cm 01/14/22 1642  Site Assessment Clean, Dry, Intact 01/14/22 1642  Lumen #1 Status Flushed;Saline locked;Blood return noted 01/14/22 1642  Lumen #2 Status Flushed;Saline locked;Blood return noted 01/14/22 1642  Dressing Type Transparent;Securing device 01/14/22 1642  Dressing Status Antimicrobial disc in place 01/14/22 [place] Not Applicable 44/92/01 0071  Line Care Connections checked and tightened 01/14/22 1642  Line Adjustment (NICU/IV Team Only) No 01/14/22 1642  Dressing Intervention New dressing 01/14/22 1642  Dressing Change Due 01/21/22 01/14/22 [place] 01/14/2022, 4:49 PM

## 2022-01-14 NOTE — Progress Notes (Addendum)
PROGRESS NOTE    HPI was taken from Dr. Charleen Kirks:  Troy Berry. is a 76 y.o. male with medical history significant of atonic bladder s/p suprapubic catheter, CKD Stage 4, anemia of chronic kidney disease, hypertension, gout who presents to the ED with c/o hematuria and abdominal distention. History obtained from patient's wife, Troy Berry present at bedside.    Troy Berry states patient had a suprapubic catheter placed approximately 1 month ago and on 12/05/2021.  Since that time he has had persistent low-volume hematuria in his Foley bag.  On 12/30/2021 he underwent a catheter exchange with IR.  After that time, he was experiencing poor drainage from catheter in addition to significantly increased bloody output in his Foley bag and around suprapubic catheter insertion site at the skin.  They had a follow-up visit with IR and urology on 9/27.  At the urology office, there was noted to be tension on the drainage tubing and it was resecured.     Since that time, Troy Berry states there has been even more bloody output in Foley bag with limited urine output.  [name] has also noticed increased abdominal distention with abdominal pain.  She denies noticing any fevers, chills, nausea, vomiting.  He did have 1 bowel movement yesterday that was normal in appearance.  They has noted increased generalized weakness that has significantly worsened over the past week.  [name] endorses occasional dizziness upon standing.   On arrival to the ED, CT renal stone study was obtained that demonstrated a pubic catheter tip present adjacent to the small bowel loop, not located within the bladder.  In addition, there was a hyperdense material seen in the left renal collecting system superior pole likely hemorrhage.  Multiple dilated bowel loops noted with no transition point, mild wall thickening of the rectosigmoid colon and trace ascites.  Urology was consulted and suprapubic catheter was repositioned.   TRH consulted for admission.   As per Dr. Maryland Pink: 76 year old male past medical history have atonic bladder status post suprapubic catheter plus stage IV chronic kidney disease and secondary hypertension who presented to the emergency room on 9/30 with complaints of suprapubic catheter dysfunction as well as abdominal distention.  Patient found to have possible SBO versus ileus.  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  Started on cefepime for possible UTI.  NG tube placed for abdominal distention.  General surgery and urology consulted.  By 10/3, noted to have worsening renal function and nephrology consulted.  No improvement in small bowel obstruction and patient taken to the OR for ex lap and reduction of internal hernia.  As per Dr. Jimmye Norman 10/4-10/10/23: Pt hasn't made much progress this week. Cr and H&H have been labile this entire week. Pt has yet have a bowel movement despite previous surgeries. Pt will be started on TPN. Also, pt had to go for another surgery- s/p re-opening of recent laparotomy on 01/10/22 as wound dehiscence and exposed omentum as per gen surg . Also, pt's mental status is poor and has incomprehensible speech at [place].  Troy Berry DOA: 01/03/2022 PCP: [facility]   Assessment & Plan:   Principal Problem:   Acute renal failure superimposed on stage 4 chronic kidney disease ([place]) Active Problems:   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HCC)   SBO (small bowel obstruction) (HCC)   Anemia   Hematuria   Urinary tract infection  Metabolic acidosis   Essential hypertension   Gross hematuria   Dementia without behavioral disturbance (HCC)   Hypokalemia   Acute blood loss anemia  Assessment and Plan: AKI on CKDIV: Cr is labile. Likely secondary to ATN dur concurrent illness & SBO as per nephro. Nephro following and recs aprpec  Suprapubic  catheter dysfunction: w/ hx of atonic bladder requiring suprapubic catheter placement approx on 12/05/21. On 12/30/2021, catheter was replaced by IR.  Since that time, patient has been experiencing difficulties including hematuria and poor urinary drainage.  He was reevaluated by urology and IR on 9/27 without any improvement.  On admission, it was discovered that suprapubic catheter tip was not in the bladder but adjacent to the small bowel. Suprapubic catheter replaced with cystogram demonstrating appropriate placement within bladder as per urology   SBO: s/p ex lap & reduction of hernia on 01/07/22 as per gen surg. S/p re-opening of recent laparotomy on 01/10/22 as wound dehiscence & exposed omentum as per gen surg. NPO as per gen surg. Repeat CT abd/pelvis shows ileus as per gen surg   Severe protein calorie malnutrition: will start TPN but unable to place PICC line x 2. Will need to consult IR in AM to have them place PICC line for TPN    ACD: secondary to CKD & hematuria. Will transfuse if Hb < 7.0   Hematuria: improved. H&H are trending up today    UTI: secondary to obstructive uropathy. Completed abx course   Hyponatremia: labile. Will continue to monitor   Hypernatremia: resolved    Metabolic acidosis: resolved   HTN: continue on amlodipine, coreg   Hypokalemia: hold off replacing potassium as Cr is elevated    Dementia: w/o behavioral disturbance. Continue w/ supportive care        DVT prophylaxis: SCDs Code Status: full  Family Communication: discussed pt's care w/ pt's wife at bedside and answered her questions  Disposition Plan: possibly d/c to SNF  Level of care: Med-Surg  Status is: Inpatient  Remains inpatient appropriate because: severity of illness, no BM yet and starting on TPN   Consultants:  Nephro General surg   Procedures:   Antimicrobials:  Subjective: Pt appears restless   Objective: Vitals:   01/13/22 1503 01/13/22 1912 01/14/22 0348 01/14/22  0711  BP: (!) 149/64 (!) 158/81 (!) 158/88 (!) 175/77  Pulse: 81 80 72 80  Resp: '18 16 18 17  '$ Temp: 97.6 F (36.4 C) 98.7 F (37.1 C) 98.9 F (37.2 C) 97.6 F (36.4 C)  TempSrc:  Oral Oral   SpO2: 96% 100% 100% 100%  Weight:      Height:        Intake/Output Summary (Last 24 hours) at 01/14/2022 0816 Last data filed at 01/14/2022 0500 Gross per 24 hour  Intake --  Output 925 ml  Net -925 ml   Filed Weights   01/11/22 0603 01/12/22 0500 01/13/22 0458  Weight: 64.5 kg 66 kg 65.9 kg    Examination:  General exam: Appears restless Respiratory system: clear breath sounds b/l. No rales or rhonchi  Cardiovascular system: S1 & S2+. No rubs or clicks  Gastrointestinal system: abd is soft, NT, ND & hypoactive bowel sounds  Central nervous system: Alert and awake. Moves all extremities  Psychiatry: judgement and insight appears poor. Flat mood and affect     Data Reviewed: I have personally reviewed following labs and imaging studies  CBC: Recent Labs  Lab 01/10/22 0553 01/11/22 6644 01/12/22 0656 01/13/22 0528 01/14/22  0516  WBC 15.0* 12.2* 18.7* 12.2* 14.5*  HGB 8.1* 8.1* 8.5* 7.9* 8.8*  HCT 25.6* 26.3* 27.0* 24.8* 27.3*  MCV 85.6 85.4 85.7 84.4 83.7  PLT 255 265 260 221 510   Basic Metabolic Panel: Recent Labs  Lab 01/10/22 0553 01/11/22 0628 01/12/22 0656 01/13/22 0528 01/14/22 0516  NA 149* 145 138 138 134*  K 3.1* 3.5 3.4* 3.2* 3.2*  CL 102 101 94* 97* 96*  CO2 37* 33* 32 29 28  GLUCOSE 76 165* 119* 101* 108*  BUN 88* 83* 78* 73* 66*  CREATININE 5.09* 4.83* 4.85* 4.97* 4.53*  CALCIUM 9.7 9.6 9.9 9.5 9.5  MG  --  2.4 2.3 2.3  --   PHOS  --  6.1* 4.9* 5.0*  --    GFR: Estimated Creatinine Clearance: 12.7 mL/min (A) (by C-G formula based on SCr of 4.53 mg/dL (H)). Liver Function Tests: No results for input(s): "AST", "ALT", "ALKPHOS", "BILITOT", "PROT", "ALBUMIN" in the last 168 hours.  No results for input(s): "LIPASE", "AMYLASE" in the last 168  hours. No results for input(s): "AMMONIA" in the last 168 hours. Coagulation Profile: No results for input(s): "INR", "PROTIME" in the last 168 hours. Cardiac Enzymes: No results for input(s): "CKTOTAL", "CKMB", "CKMBINDEX", "TROPONINI" in the last 168 hours. BNP (last 3 results) No results for input(s): "PROBNP" in the last 8760 hours. HbA1C: No results for input(s): "HGBA1C" in the last 72 hours. CBG: No results for input(s): "GLUCAP" in the last 168 hours.  Lipid Profile: No results for input(s): "CHOL", "HDL", "LDLCALC", "TRIG", "CHOLHDL", "LDLDIRECT" in the last 72 hours. Thyroid Function Tests: No results for input(s): "TSH", "T4TOTAL", "FREET4", "T3FREE", "THYROIDAB" in the last 72 hours. Anemia Panel: No results for input(s): "VITAMINB12", "FOLATE", "FERRITIN", "TIBC", "IRON", "RETICCTPCT" in the last 72 hours. Sepsis Labs: Recent Labs  Lab 01/08/22 0421  PROCALCITON 6.11    Recent Results (from the past 240 hour(s))  Urine Culture     Status: None   Collection Time: 01/05/22  8:56 AM   Specimen: Urine, Suprapubic  Result Value Ref Range Status   Specimen Description   Final    URINE, SUPRAPUBIC Performed at [facility], [address], [place], [place] [address]    Special Requests   Final    NONE Performed at [facility], [address], [place], [place] [address]    Culture   Final    NO GROWTH Performed at [facility], [address], [place], [place] [address]    Report Status 01/06/2022 FINAL  Final         Radiology Studies: CT ABDOMEN PELVIS WO CONTRAST  Result Date: 01/13/2022 CLINICAL DATA:  Abdominal pain, postop bowel obstruction suspected. Small-bowel obstruction, 5 day postop status post reduction of internal hernia, complicated by midline wound dehiscence status post wound closure postop day 2. EXAM: CT ABDOMEN AND PELVIS WITHOUT CONTRAST TECHNIQUE: Multidetector CT imaging of the abdomen and pelvis was performed  following the standard protocol without IV contrast. RADIATION DOSE REDUCTION: This exam was performed according to the departmental dose-optimization program which includes automated exposure control, adjustment of the mA and/or kV according to patient size and/or use of iterative reconstruction technique. COMPARISON:  CT abdomen and pelvis dated 01/03/2022 and CT cystogram dated 01/03/2022. FINDINGS: Lower chest: Small bilateral pleural effusions. Bibasilar atelectasis. Dense coronary artery calcifications at the heart base. Hepatobiliary: No focal liver abnormality is seen. Multiple calcified gallstones, as previously described. Gallbladder moderately distended. No bile duct  dilatation is seen. Pancreas: Unremarkable. No pancreatic ductal dilatation or surrounding inflammatory changes. Spleen: Normal in size without focal abnormality. Adrenals/Urinary Tract: Suprapubic catheter appears appropriately positioned within the bladder. 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 As also previously described, there is hyperdense material within the upper pole moiety of the LEFT renal pelvis, not significantly changed compared to the recent CTs of 01/03/2022 but new compared to an earlier CT of 04/27/2019. Bilateral nonobstructing renal stones. Bilateral renal cysts, as previously described. No follow-up imaging is recommended for these benign findings. No acute findings of either kidney. No ureteral or bladder stone is seen. Stomach/Bowel: Mildly distended fluid-filled small bowel loops within the LEFT abdomen and upper central abdomen, with some associated air-fluid levels which are nonspecific in configuration. Large bowel is of normal caliber. Diverticulosis is seen throughout the colon. Moderate-sized stool ball in the rectal vault. Appendix is normal. Stomach is moderately distended with fluid and associated air-fluid  levels. Vascular/Lymphatic: Extensive aortic atherosclerosis. No abdominal aortic aneurysm. No enlarged lymph nodes are seen. Reproductive: Prostate is enlarged causing mass effect on the bladder base, not significantly changed compared to the older study of 04/27/2019. Other: Small amount of free fluid in the abdomen and pelvis. No evidence of abscess collection. No evidence of free intraperitoneal air. Musculoskeletal: Degenerative spondylosis of the thoracolumbar spine, moderate to severe in degree. No acute-appearing osseous abnormality. Fluid distends the RIGHT inguinal canal, increased compared to the earlier studies of 01/03/2022. Small focus of air within the RIGHT inguinal canal, of uncertain significance, possibly related to a recent procedural intervention IMPRESSION: 1. Mildly distended fluid-filled small bowel loops within the LEFT abdomen and upper central abdomen, with some associated air-fluid levels which are nonspecific in configuration. Differential considerations include adynamic ileus versus a partial small bowel obstruction, favor ileus. 2. Suprapubic catheter appears appropriately positioned within the bladder. 3. Hyperdense material within the upper pole moiety of the LEFT renal pelvis, not significantly changed compared to the recent CTS of 01/03/2022. As previously suggested, this could represent sequela of a previous/recent hemorrhage or excreted contrast material from a prior exam. Neoplastic process is considered less likely. Recommend follow-up renal protocol CT in 3 months as also recommended on the initial CT report of 01/03/2022. 4. Small bilateral pleural effusions. 5. Small amount of free fluid in the abdomen and pelvis. No evidence of abscess collection. No evidence of free intraperitoneal air. 6. Fluid distends the RIGHT inguinal canal, increased compared to the earlier studies of 01/03/2022, with associated punctate focus of air and with wall thickening/inflammation. This is of  uncertain significance, possibly a concomitant vasitis or sequela of interval procedural intervention including repositioning of the suprapubic catheter. Any pain or superficial signs of infection at the RIGHT inguinal region? If so, consider directed ultrasound for further characterization. 7. Colonic diverticulosis without evidence of acute diverticulitis. 8. Cholelithiasis without evidence of acute cholecystitis. Electronically Signed   By: Franki Cabot M.D.   On: 01/13/2022 11:34        Scheduled Meds:  amLODipine  10 mg Oral Daily   carvedilol  25 mg Oral BID WC   feeding supplement  1 Container Oral TID BM   multivitamin with minerals  1 tablet Oral Daily   polyethylene glycol  17 g Oral BID   sodium chloride flush  3 mL Intravenous Q12H   Continuous Infusions:  sodium chloride     dextrose 75 mL/hr at 01/14/22 [phone]  LOS: 10 days    Time spent: 30  mins     Wyvonnia Dusky, MD [facility] Pager [phone]  If 7PM-7AM, please contact night-coverage [email or web address] 01/14/2022, 8:16 AM

## 2022-01-14 NOTE — Progress Notes (Signed)
Patient ID: Troy Berry., male   DOB: 1945/09/04, 76 y.o.   MRN: [id number]     [facility] Day(s): 10.   Interval History: Patient seen and examined, no acute events or new complaints overnight.  No new issues overnight.  No new complaints.  Patient continue having abdominal distention.  Softer than yesterday.  Still no evidence of gas or bowel movement.  Vital signs in last 24 hours: [min-max] current  Temp:  [97.6 F (36.4 C)-98.9 F (37.2 C)] 97.6 F (36.4 C) (10/10 0711) Pulse Rate:  [72-81] 80 (10/10 0711) Resp:  [16-18] 17 (10/10 0711) BP: (149-175)/(64-88) 175/77 (10/10 0711) SpO2:  [96 %-100 %] 100 % (10/10 0711)     Height: '5\' 9"'$  (175.3 cm) Weight: 65.9 kg BMI (Calculated): 21.44   Physical Exam:  Constitutional: alert, no distress  Respiratory: breathing non-labored at rest  Cardiovascular: regular rate and sinus rhythm  Gastrointestinal: soft, non-tender, and mild-distended  Labs:     Latest Ref Rng & Units 01/14/2022    5:16 AM 01/13/2022    5:28 AM 01/12/2022    6:56 AM  CBC  WBC 4.0 - 10.5 K/uL 14.5  12.2  18.7   Hemoglobin 13.0 - 17.0 g/dL 8.8  7.9  8.5   Hematocrit 39.0 - 52.0 % 27.3  24.8  27.0   Platelets 150 - 400 K/uL 279  221  260       Latest Ref Rng & Units 01/14/2022    5:16 AM 01/13/2022    5:28 AM 01/12/2022    6:56 AM  CMP  Glucose 70 - 99 mg/dL 108  101  119   BUN 8 - 23 mg/dL 66  73  78   Creatinine 0.61 - 1.24 mg/dL 4.53  4.97  4.85   Sodium 135 - 145 mmol/L 134  138  138   Potassium 3.5 - 5.1 mmol/L 3.2  3.2  3.4   Chloride 98 - 111 mmol/L 96  97  94   CO2 22 - 32 mmol/L 28  29  32   Calcium 8.9 - 10.3 mg/dL 9.5  9.5  9.9     Imaging studies: No new pertinent imaging studies   Assessment/Plan:  76 y.o. male with small bowel obstruction 7 Day Post-Op s/p reduction of internal hernia.  The was complicated by midline wound dehiscence status post wound closure postop day #4.  -Patient without any clinical  duration.  There is still no significant improvement either.  Still with abdominal distention.  No evidence of nausea or vomiting.  Still no evidence of bowel function either.  We will continue with medical optimization. -Consider TPN due to prolonged ileus.  Arnold Long, MD

## 2022-01-14 NOTE — Progress Notes (Signed)
Order received from Dr Windell Moment for abdominal binder to prevent patient from pulling on suprapubic cath or messing with surgical site

## 2022-01-14 NOTE — Plan of Care (Signed)
  Problem: Education: Goal: Knowledge of General Education information will improve Description Including pain rating scale, medication(s)/side effects and non-pharmacologic comfort measures Outcome: Progressing   Problem: Health Behavior/Discharge Planning: Goal: Ability to manage health-related needs will improve Outcome: Progressing   

## 2022-01-14 NOTE — Progress Notes (Signed)
[facility]  ROUNDING NOTE   Subjective:   Patient seen sitting up in bed, resting quietly with eyes closed Wife at bedside Hematuria improving in Foley catheter Currently n.p.o. for suspected ileus.   Creatinine 4.53 UOP 959m via suprapubic catheter   Objective:  Vital signs in last 24 hours:  Temp:  [97.6 F (36.4 C)-98.9 F (37.2 C)] 98.1 F (36.7 C) (10/10 1116) Pulse Rate:  [72-85] 85 (10/10 1116) Resp:  [16-18] 17 (10/10 1116) BP: (132-175)/(64-88) 132/72 (10/10 1116) SpO2:  [96 %-100 %] 97 % (10/10 1116)  Weight change:  Filed Weights   01/11/22 0603 01/12/22 0500 01/13/22 0458  Weight: 64.5 kg 66 kg 65.9 kg    Intake/Output: I/O last 3 completed shifts: In: 825.8 [I.V.:725.8; IV Piggyback:100] Out: 13846[[KZLDJ:5701]  Intake/Output this shift:  No intake/output data recorded.  Physical Exam: General: NAD  Head: Normocephalic, atraumatic.   Eyes: Anicteric  Lungs:  Clear to auscultation, normal effort  Heart: Regular rate and rhythm  Abdomen:  Soft, nontender  Extremities:  No peripheral edema.  Neurologic: Nonfocal, moving all four extremities  Skin: No lesions  Access: None  GU-suprapubic catheter-hematuria improving  Basic Metabolic Panel: Recent Labs  Lab 01/10/22 0553 01/11/22 0628 01/12/22 0656 01/13/22 0528 01/14/22 0516  NA 149* 145 138 138 134*  K 3.1* 3.5 3.4* 3.2* 3.2*  CL 102 101 94* 97* 96*  CO2 37* 33* 32 29 28  GLUCOSE 76 165* 119* 101* 108*  BUN 88* 83* 78* 73* 66*  CREATININE 5.09* 4.83* 4.85* 4.97* 4.53*  CALCIUM 9.7 9.6 9.9 9.5 9.5  MG  --  2.4 2.3 2.3  --   PHOS  --  6.1* 4.9* 5.0*  --      Liver Function Tests: No results for input(s): "AST", "ALT", "ALKPHOS", "BILITOT", "PROT", "ALBUMIN" in the last 168 hours.  No results for input(s): "LIPASE", "AMYLASE" in the last 168 hours. No results for input(s): "AMMONIA" in the last 168 hours.  CBC: Recent Labs  Lab 01/10/22 0553 01/11/22 0628  01/12/22 0656 01/13/22 0528 01/14/22 0516  WBC 15.0* 12.2* 18.7* 12.2* 14.5*  HGB 8.1* 8.1* 8.5* 7.9* 8.8*  HCT 25.6* 26.3* 27.0* 24.8* 27.3*  MCV 85.6 85.4 85.7 84.4 83.7  PLT 255 265 260 221 279     Cardiac Enzymes: No results for input(s): "CKTOTAL", "CKMB", "CKMBINDEX", "TROPONINI" in the last 168 hours.  BNP: Invalid input(s): "POCBNP"  CBG: No results for input(s): "GLUCAP" in the last 168 hours.   Microbiology: Results for orders placed or performed during the hospital encounter of 01/03/22  Urine Culture     Status: None   Collection Time: 01/05/22  8:56 AM   Specimen: Urine, Suprapubic  Result Value Ref Range Status   Specimen Description   Final    URINE, SUPRAPUBIC [facility] [address] [place] [address]   Special Requests   Final    NONE [facility] [address], [place] [address]   Culture   Final    NO GROWTH [facility] [address] [place]  [id number]   Report Status 01/06/2022 FINAL  Final    Coagulation Studies: No results for input(s): "LABPROT", "INR" in the last 72 hours.  Urinalysis: No results for input(s): "COLORURINE", "LABSPEC", "PHURINE", "GLUCOSEU", "HGBUR", "BILIRUBINUR", "KETONESUR", "PROTEINUR", "UROBILINOGEN", "NITRITE", "LEUKOCYTESUR" in the last 72 hours.  Invalid input(s): "APPERANCEUR"  Imaging: [place] EKG SITE RITE  Result Date: 01/14/2022 If Site Rite image not attached, placement could not be confirmed due to current cardiac rhythm.  [place] EKG SITE RITE  Result Date: 01/14/2022 If Site Rite image not attached, placement could not be confirmed due to current cardiac rhythm.  CT ABDOMEN PELVIS WO CONTRAST  Result Date: 01/13/2022 CLINICAL DATA:  Abdominal pain, postop bowel obstruction suspected. Small-bowel obstruction, 5 day postop status post reduction of internal hernia, complicated by midline wound  dehiscence status post wound closure postop day 2. EXAM: CT ABDOMEN AND PELVIS WITHOUT CONTRAST TECHNIQUE: Multidetector CT imaging of the abdomen and pelvis was performed following the standard protocol without IV contrast. RADIATION DOSE REDUCTION: This exam was performed according to the departmental dose-optimization program which includes automated exposure control, adjustment of the mA and/or kV according to patient size and/or use of iterative reconstruction technique. COMPARISON:  CT abdomen and pelvis dated 01/03/2022 and CT cystogram dated 01/03/2022. FINDINGS: Lower chest: Small bilateral pleural effusions. Bibasilar atelectasis. Dense coronary artery calcifications at the heart base. Hepatobiliary: No focal liver abnormality is seen. Multiple calcified gallstones, as previously described. Gallbladder moderately distended. No bile duct dilatation is seen. Pancreas: Unremarkable. No pancreatic ductal dilatation or surrounding inflammatory changes. Spleen: Normal in size without focal abnormality. Adrenals/Urinary Tract: Suprapubic catheter appears appropriately positioned within the bladder. 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 As also previously described, there is hyperdense material within the upper pole moiety of the LEFT renal pelvis, not significantly changed compared to the recent CTs of 01/03/2022 but new compared to an earlier CT of 04/27/2019. Bilateral nonobstructing renal stones. Bilateral renal cysts, as previously described. No follow-up imaging is recommended for these benign findings. No acute findings of either kidney. No ureteral or bladder stone is seen. Stomach/Bowel: Mildly distended fluid-filled small bowel loops within the LEFT abdomen and upper central abdomen, with some associated air-fluid levels which are nonspecific in configuration. Large bowel is of normal caliber.  Diverticulosis is seen throughout the colon. Moderate-sized stool ball in the rectal vault. Appendix is normal. Stomach is moderately distended with fluid and associated air-fluid levels. Vascular/Lymphatic: Extensive aortic atherosclerosis. No abdominal aortic aneurysm. No enlarged lymph nodes are seen. Reproductive: Prostate is enlarged causing mass effect on the bladder base, not significantly changed compared to the older study of 04/27/2019. Other: Small amount of free fluid in the abdomen and pelvis. No evidence of abscess collection. No evidence of free intraperitoneal air. Musculoskeletal: Degenerative spondylosis of the thoracolumbar spine, moderate to severe in degree. No acute-appearing osseous abnormality. Fluid distends the RIGHT inguinal canal, increased compared to the earlier studies of 01/03/2022. Small focus of air within the RIGHT inguinal canal, of uncertain significance, possibly related to a recent procedural intervention IMPRESSION: 1. Mildly distended fluid-filled small bowel loops within the LEFT abdomen and upper central abdomen, with some associated air-fluid levels which are nonspecific in configuration. Differential considerations include adynamic ileus versus a partial small bowel obstruction, favor ileus. 2. Suprapubic catheter appears appropriately positioned within the bladder. 3. Hyperdense material within the upper pole moiety of the LEFT renal pelvis, not significantly changed compared to the recent CTS of 01/03/2022. As previously suggested, this could represent sequela of a previous/recent hemorrhage or excreted contrast material from a prior exam. Neoplastic process is considered less likely. Recommend follow-up renal protocol CT in 3 months as also recommended on the initial CT report of 01/03/2022. 4. Small bilateral  pleural effusions. 5. Small amount of free fluid in the abdomen and pelvis. No evidence of abscess collection. No evidence of free intraperitoneal air. 6. Fluid  distends the RIGHT inguinal canal, increased compared to the earlier studies of 01/03/2022, with associated punctate focus of air and with wall thickening/inflammation. This is of uncertain significance, possibly a concomitant vasitis or sequela of interval procedural intervention including repositioning of the suprapubic catheter. Any pain or superficial signs of infection at the RIGHT inguinal region? If so, consider directed ultrasound for further characterization. 7. Colonic diverticulosis without evidence of acute diverticulitis. 8. Cholelithiasis without evidence of acute cholecystitis. Electronically Signed   By: Franki Cabot M.D.   On: 01/13/2022 11:34     Medications:    sodium chloride     dextrose 75 mL/hr at 01/14/22 5361   dextrose     TPN ADULT (ION)      amLODipine  10 mg Oral Daily   carvedilol  25 mg Oral BID WC   polyethylene glycol  17 g Oral BID   potassium chloride  40 mEq Oral Once   sodium chloride flush  3 mL Intravenous Q12H   sodium chloride, acetaminophen **OR** acetaminophen, haloperidol lactate, hydrALAZINE, iohexol, metoprolol tartrate, morphine injection, sodium chloride flush  Assessment/ Plan:  Troy Berry. is a 76 y.o.  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 Renal function has improved. Adequate urine output noted. No acute need for dialysis. Will continue to monitor.   Lab Results  Component Value Date   CREATININE 4.53 (H) 01/14/2022   CREATININE 4.97 (H) 01/13/2022   CREATININE 4.85 (H) 01/12/2022    Intake/Output Summary (Last 24 hours) at 01/14/2022 1229 Last data filed at 01/14/2022 0500 Gross per 24 hour  Intake --  Output 925 ml  Net  -925 ml    Hypernatremia likely due to decreased oral intake. Now hyponatremic.   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 Improving   Small bowel obstruction Status post exploratory laparotomy on 01/07/2022. Experienced wound dehiscence with repair in OR on 01/10/22. Surgery recommending TPN   LOS: [place] 10/10/202312:29 PM

## 2022-01-15 ENCOUNTER — Inpatient Hospital Stay: Payer: Medicare HMO

## 2022-01-15 ENCOUNTER — Inpatient Hospital Stay: Payer: Medicare HMO | Admitting: Radiology

## 2022-01-15 DIAGNOSIS — T8130XA Disruption of wound, unspecified, initial encounter: Secondary | ICD-10-CM | POA: Diagnosis not present

## 2022-01-15 DIAGNOSIS — E43 Unspecified severe protein-calorie malnutrition: Secondary | ICD-10-CM | POA: Diagnosis not present

## 2022-01-15 DIAGNOSIS — F0390 Unspecified dementia without behavioral disturbance: Secondary | ICD-10-CM | POA: Diagnosis not present

## 2022-01-15 DIAGNOSIS — K56609 Unspecified intestinal obstruction, unspecified as to partial versus complete obstruction: Secondary | ICD-10-CM | POA: Diagnosis not present

## 2022-01-15 DIAGNOSIS — N179 Acute kidney failure, unspecified: Secondary | ICD-10-CM | POA: Diagnosis not present

## 2022-01-15 LAB — COMPREHENSIVE METABOLIC PANEL
ALT: 7 U/L (ref 0–44)
AST: 23 U/L (ref 15–41)
Albumin: 2.3 g/dL — ABNORMAL LOW (ref 3.5–5.0)
Alkaline Phosphatase: 41 U/L (ref 38–126)
Anion gap: 11 (ref 5–15)
BUN: 65 mg/dL — ABNORMAL HIGH (ref 8–23)
CO2: 27 mmol/L (ref 22–32)
Calcium: 9.3 mg/dL (ref 8.9–10.3)
Chloride: 96 mmol/L — ABNORMAL LOW (ref 98–111)
Creatinine, Ser: 4.68 mg/dL — ABNORMAL HIGH (ref 0.61–1.24)
GFR, Estimated: 12 mL/min — ABNORMAL LOW (ref >60)
Glucose, Bld: 92 mg/dL (ref 70–99)
Potassium: 4 mmol/L (ref 3.5–5.1)
Sodium: 134 mmol/L — ABNORMAL LOW (ref 135–145)
Total Bilirubin: 0.7 mg/dL (ref 0.3–1.2)
Total Protein: 5.2 g/dL — ABNORMAL LOW (ref 6.5–8.1)

## 2022-01-15 LAB — CBC
HCT: 24.2 % — ABNORMAL LOW (ref 39.0–52.0)
Hemoglobin: 7.6 g/dL — ABNORMAL LOW (ref 13.0–17.0)
MCH: 27.1 pg (ref 26.0–34.0)
MCHC: 31.4 g/dL (ref 30.0–36.0)
MCV: 86.4 fL (ref 80.0–100.0)
Platelets: 176 10*3/uL (ref 150–400)
RBC: 2.8 MIL/uL — ABNORMAL LOW (ref 4.22–5.81)
RDW: 15.2 % (ref 11.5–15.5)
WBC: 16.1 10*3/uL — ABNORMAL HIGH (ref 4.0–10.5)
nRBC: 0 % (ref 0.0–0.2)

## 2022-01-15 LAB — GLUCOSE, CAPILLARY
Glucose-Capillary: 105 mg/dL — ABNORMAL HIGH (ref 70–99)
Glucose-Capillary: 106 mg/dL — ABNORMAL HIGH (ref 70–99)
Glucose-Capillary: 75 mg/dL (ref 70–99)

## 2022-01-15 LAB — TRIGLYCERIDES: Triglycerides: 129 mg/dL (ref <150)

## 2022-01-15 LAB — BRAIN NATRIURETIC PEPTIDE: B Natriuretic Peptide: 395.3 pg/mL — ABNORMAL HIGH (ref 0.0–100.0)

## 2022-01-15 LAB — MAGNESIUM: Magnesium: 2.3 mg/dL (ref 1.7–2.4)

## 2022-01-15 LAB — PHOSPHORUS: Phosphorus: 4.7 mg/dL — ABNORMAL HIGH (ref 2.5–4.6)

## 2022-01-15 LAB — HEMOGLOBIN A1C
Hgb A1c MFr Bld: 5.2 % (ref 4.8–5.6)
Mean Plasma Glucose: 102.54 mg/dL

## 2022-01-15 MED ORDER — SODIUM CHLORIDE 0.9 % IV BOLUS
500.0000 mL | Freq: Once | INTRAVENOUS | Status: AC
  Administered 2022-01-15: 500 mL via INTRAVENOUS

## 2022-01-15 MED ORDER — FUROSEMIDE 10 MG/ML IJ SOLN
20.0000 mg | Freq: Two times a day (BID) | INTRAMUSCULAR | Status: DC
  Administered 2022-01-15 – 2022-01-27 (×23): 20 mg via INTRAVENOUS
  Filled 2022-01-15 (×24): qty 4

## 2022-01-15 MED ORDER — LIDOCAINE HCL 1 % IJ SOLN
INTRAMUSCULAR | Status: AC
  Administered 2022-01-15: 6 mL
  Filled 2022-01-15: qty 20

## 2022-01-15 MED ORDER — ONDANSETRON HCL 4 MG/2ML IJ SOLN
4.0000 mg | Freq: Once | INTRAMUSCULAR | Status: AC
  Administered 2022-01-15: 4 mg via INTRAVENOUS
  Filled 2022-01-15: qty 2

## 2022-01-15 MED ORDER — TRAVASOL 10 % IV SOLN
INTRAVENOUS | Status: AC
  Filled 2022-01-15: qty 508.8

## 2022-01-15 NOTE — Procedures (Signed)
PROCEDURE SUMMARY:  Successful placement of image-guided double lumen PICC line to the right brachial vein. Length 36 cm. Tip at lower SVC/RA. No complications. EBL = > 5 ml. Ready for use.  Please see imaging section of Epic for full dictation.   Theresa Duty, NP 01/15/2022 3:50 PM

## 2022-01-15 NOTE — Assessment & Plan Note (Addendum)
Nutrition Status: Nutrition Problem: Severe Malnutrition Etiology: chronic illness (dementia, CKD, internal hernia) Signs/Symptoms: severe fat depletion, severe muscle depletion   on TPN

## 2022-01-15 NOTE — Progress Notes (Signed)
[facility]  ROUNDING NOTE   Subjective:   Patient sitting up in bed, eyes open.  Wife at bedside.  NG tube replaced.  Remains confused with safety sitter at bedside   Creatinine 4.68 UOP 1L via suprapubic catheter   Objective:  Vital signs in last 24 hours:  Temp:  [97.9 F (36.6 C)-98.6 F (37 C)] 97.9 F (36.6 C) (10/11 1221) Pulse Rate:  [57-89] 59 (10/11 1221) Resp:  [16-20] 17 (10/11 1221) BP: (86-121)/(41-79) 95/63 (10/11 1221) SpO2:  [76 %-94 %] 94 % (10/11 1221)  Weight change:  Filed Weights   01/11/22 0603 01/12/22 0500 01/13/22 0458  Weight: 64.5 kg 66 kg 65.9 kg    Intake/Output: I/O last 3 completed shifts: In: 1886.9 [I.V.:1885.9; IV Piggyback:1] Out: 9163 [WGYKZ:9935]   Intake/Output this shift:  Total I/O In: -  Out: 400 [Emesis/NG output:400]  Physical Exam: General: NAD  Head: Normocephalic, atraumatic.   Eyes: Anicteric  Lungs:  Clear to auscultation, normal effort  Heart: Regular rate and rhythm  Abdomen:  Soft, nontender  Extremities:  No peripheral edema.  Neurologic: Nonfocal, moving all four extremities  Skin: No lesions  Access: None  GU-suprapubic catheter-hematuria  Basic Metabolic Panel: Recent Labs  Lab 01/11/22 0628 01/12/22 0656 01/13/22 0528 01/14/22 0516 01/15/22 0545  NA 145 138 138 134* 134*  K 3.5 3.4* 3.2* 3.2* 4.0  CL 101 94* 97* 96* 96*  CO2 33* 32 '29 28 27  '$ GLUCOSE 165* 119* 101* 108* 92  BUN 83* 78* 73* 66* 65*  CREATININE 4.83* 4.85* 4.97* 4.53* 4.68*  CALCIUM 9.6 9.9 9.5 9.5 9.3  MG 2.4 2.3 2.3  --  2.3  PHOS 6.1* 4.9* 5.0*  --  4.7*     Liver Function Tests: Recent Labs  Lab 01/15/22 0545  AST 23  ALT 7  ALKPHOS 41  BILITOT 0.7  PROT 5.2*  ALBUMIN 2.3*    No results for input(s): "LIPASE", "AMYLASE" in the last 168 hours. No results for input(s): "AMMONIA" in the last 168 hours.  CBC: Recent Labs  Lab 01/11/22 0628 01/12/22 0656 01/13/22 0528 01/14/22 0516  01/15/22 0545  WBC 12.2* 18.7* 12.2* 14.5* 16.1*  HGB 8.1* 8.5* 7.9* 8.8* 7.6*  HCT 26.3* 27.0* 24.8* 27.3* 24.2*  MCV 85.4 85.7 84.4 83.7 86.4  PLT 265 260 221 279 176     Cardiac Enzymes: No results for input(s): "CKTOTAL", "CKMB", "CKMBINDEX", "TROPONINI" in the last 168 hours.  BNP: Invalid input(s): "POCBNP"  CBG: Recent Labs  Lab 01/14/22 2336 01/15/22 1221  GLUCAP 107* 56     Microbiology: Results for orders placed or performed during the hospital encounter of 01/03/22  Urine Culture     Status: None   Collection Time: 01/05/22  8:56 AM   Specimen: Urine, Suprapubic  Result Value Ref Range Status   Specimen Description   Final    URINE, SUPRAPUBIC Performed at [facility], [address] [place], [place] [address]    Special Requests   Final    NONE Performed at [facility], [address] [place], [place] [address]    Culture   Final    NO GROWTH Performed at [facility], [place] [address] [place], [place] [address]    Report Status 01/06/2022 FINAL  Final    Coagulation Studies: No results for input(s): "LABPROT", "INR" in the last 72 hours.  Urinalysis: No results for input(s): "COLORURINE", "LABSPEC", "PHURINE", "GLUCOSEU", "HGBUR", "BILIRUBINUR", "KETONESUR", "PROTEINUR", "UROBILINOGEN", "  NITRITE", "LEUKOCYTESUR" in the last 72 hours.  Invalid input(s): "APPERANCEUR"    Imaging: DG Abd 1 View  Result Date: 01/15/2022 CLINICAL DATA:  Nasogastric tube present EXAM: ABDOMEN - 1 VIEW COMPARISON:  None Available. FINDINGS: Gastric tube courses below the diaphragm with the tip in the expected region stomach. Bowel dilation and bibasilar opacities, better evaluated on same day CT. IMPRESSION: Gastric tube courses below the diaphragm with the tip in the expected region stomach. Electronically Signed   By: Margaretha Sheffield M.D.   On: 01/15/2022 09:39   CT ABDOMEN PELVIS WO CONTRAST  Result Date: 01/15/2022 CLINICAL DATA:   Acute nonlocalized abdominal pain EXAM: CT ABDOMEN AND PELVIS WITHOUT CONTRAST TECHNIQUE: Multidetector CT imaging of the abdomen and pelvis was performed following the standard protocol without IV contrast. RADIATION DOSE REDUCTION: This exam was performed according to the departmental dose-optimization program which includes automated exposure control, adjustment of the mA and/or kV according to patient size and/or use of iterative reconstruction technique. COMPARISON:  01/13/2022 FINDINGS: Lower chest: Small bilateral pleural effusions with basilar atelectasis. Motion artifact limits evaluation. Hepatobiliary: Cholelithiasis with multiple stones in the gallbladder. Increased density in the bowel suggesting sludge. No inflammatory stranding. No bile duct dilatation. No focal liver lesions. Vascular calcifications. Pancreas: Unremarkable. No pancreatic ductal dilatation or surrounding inflammatory changes. Spleen: Normal in size without focal abnormality. Adrenals/Urinary Tract: No adrenal gland nodules. Increased density in the renal hila consistent with prominent vascular calcifications. Left renal cysts, largest measuring 3.9 cm diameter. Probable hemorrhagic cysts. No change since prior study. No imaging follow-up indicated. No hydronephrosis or hydroureter. Bladder is decompressed with a suprapubic catheter in place. Stomach/Bowel: Moderately dilated gas and fluid-filled stomach and small bowel. Contrast material demonstrated throughout the colon suggest no evidence of complete obstruction. Changes may represent partial obstruction, ileus or possibly enteritis. No wall thickening or pneumatosis identified. Vascular/Lymphatic: Diffuse vascular calcifications. No aneurysm. No significant lymphadenopathy. Reproductive: Prostate gland is enlarged. Other: Moderate diffuse abdominal and pelvic ascites. No free air. Small right inguinal hernia containing fat and fluid. Musculoskeletal: Degenerative changes in the  spine and hips. No acute bony abnormalities. Old left rib fractures. IMPRESSION: 1. Persistent small bowel dilatation with fluid-filled loops. Contrast material is seen in the colon suggesting no evidence of complete obstruction. Changes may represent partial obstruction, ileus, or enteritis. 2. Small bilateral pleural effusions with basilar atelectasis. 3. Cholelithiasis without evidence of acute cholecystitis. 4. Moderate abdominal and pelvic ascites. 5. Suprapubic catheter decompresses the bladder. 6. Enlarged prostate gland. 7. Small right inguinal hernia containing fat and fluid. Electronically Signed   By: Lucienne Capers M.D.   On: 01/15/2022 01:01   DG Abd Portable 1V  Addendum Date: 01/15/2022   ADDENDUM REPORT: 01/15/2022 00:01 ADDENDUM: These results were called by telephone at the time of interpretation on 01/15/2022 at 12:00 am to provider NP KATY FOUST , who verbally acknowledged these results. Electronically Signed   By: Iven Finn M.D.   On: 01/15/2022 00:01   Result Date: 01/15/2022 CLINICAL DATA:  244010 Abdominal pain [id number] EXAM: PORTABLE ABDOMEN - 1 VIEW COMPARISON:  CT abdomen pelvis 01/13/2022, x-ray abdomen 01/10/2022 FINDINGS: Interval worsening of small and large bowel gaseous dilatation. Unable to exclude pneumoperitoneum. No radio-opaque calculi or other significant radiographic abnormality are seen. Suprapubic catheter overlies the pelvis. Vascular calcifications. IMPRESSION: Interval worsening of small and large bowel gaseous dilatation. Unable to exclude pneumoperitoneum. Recommend CT with intravenous contrast for further evaluation. These results will be called to the ordering clinician  or representative by the Radiologist Assistant, and communication documented in the PACS or [employer]. Electronically Signed: By: Iven Finn M.D. On: 01/14/2022 23:47   [place] EKG SITE RITE  Result Date: 01/14/2022 If Site Rite image not attached, placement could not be  confirmed due to current cardiac rhythm.  [place] EKG SITE RITE  Result Date: 01/14/2022 If Site Rite image not attached, placement could not be confirmed due to current cardiac rhythm.    Medications:    sodium chloride     dextrose 100 mL/hr at 01/15/22 1031   dextrose     TPN ADULT (ION)     TPN ADULT (ION)      Chlorhexidine Gluconate Cloth  6 each Topical Daily   insulin aspart  0-9 Units Subcutaneous Q6H   polyethylene glycol  17 g Oral BID   sodium chloride flush  3 mL Intravenous Q12H   sodium chloride, acetaminophen **OR** acetaminophen, haloperidol lactate, hydrALAZINE, iohexol, morphine injection, sodium chloride flush  Assessment/ Plan:  [name] is a 76 y.o.  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 Creatinine slightly elevated but stable.  Patient continues to have adequate urine output.  No acute need for dialysis at this time.  We will continue to monitor daily.  Lab Results  Component Value Date   CREATININE 4.68 (H) 01/15/2022   CREATININE 4.53 (H) 01/14/2022   CREATININE 4.97 (H) 01/13/2022    Intake/Output Summary (Last 24 hours) at 01/15/2022 1404 Last data filed at 01/15/2022 0956 Gross per 24 hour  Intake --  Output 900 ml  Net -900 ml    Hypernatremia likely due to decreased oral intake.   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  Continues to improve.   Small bowel obstruction Status post exploratory laparotomy on 01/07/2022. Experienced wound dehiscence with repair in OR on  01/10/22. Surgery recommending TPN.   LOS: 11 [place] 10/11/20232:04 PM

## 2022-01-15 NOTE — Progress Notes (Signed)
       CROSS COVER NOTE  NAME: Troy Berry. MRN: [id number] DOB : [birth year]    Date of Service   01/15/2022   HPI/Events of Note   Notified by nursing that Troy Berry stomach is more distended, he has had episodes of nausea, and he is reporting abdominal pain.  On my bedside evaluation his abdomen is distended but soft and non-tender to palpation.   Interventions   Assessment/Plan:  Xray Abd ---> Worsening of small and large bowel gaseous dilatation. Recommend CT to R/O pneumoperitoneum  CT Abd Pelvis WO- Creatinine 4.53 CKD-4 --> Small bowel dilatation with fluid filled loops without evidence of complete obstruction.    This document was prepared using Dragon voice recognition software and may include unintentional dictation errors.  Neomia Glass DNP, MBA, FNP-BC Nurse Practitioner [facility] Pager [phone] .

## 2022-01-15 NOTE — TOC Progression Note (Signed)
Transition of Care (TOC) - Progression Note    Patient Details  Name: Troy Berry. MRN: [id number] Date of Birth: 1945/11/21  Transition of Care [facility]) CM/SW Contact  Beverly Sessions, RN Phone Number: 01/15/2022, 8:52 AM  Clinical Narrative:     Confirmed with RN that patient does not have mitts or any restraints. Does have Air cabin crew.  Now plans for PICC line and TPN.   Bed offers reviewed with wife.  She is aware that patient is not ready for discharge.  She is to discuss options with her daughter this evening.   Her preference is [employer] [facility] or [place].  [facility] rehab is listed as considering TOC to follow up with Ebony Hail at [facility]    Expected Discharge Plan:  (TBD) Barriers to Discharge: Continued Medical Work up  Expected Discharge Plan and Services Expected Discharge Plan:  (TBD)     Post Acute Care Choice:  (TBD) Living arrangements for the past 2 months: Single Family Home                                       Social Determinants of Health (SDOH) Interventions Transportation Interventions: Inpatient TOC, Other (Comment) (Transportation provided through [employer])  Readmission Risk Interventions     No data to display

## 2022-01-15 NOTE — Progress Notes (Signed)
Patient ID: [name], male   DOB: 06/02/1945, 76 y.o.   MRN: [id number]     [facility] Day(s): 11.   Interval History: Patient seen and examined.  As per his nurse patient developed nausea and vomiting last night.  CT scan of the abdomen and pelvis was done showing ileus but no sign of obstruction.  Vital signs in last 24 hours: [min-max] current  Temp:  [97.6 F (36.4 C)-98.6 F (37 C)] 97.6 F (36.4 C) (10/11 1911) Pulse Rate:  [57-89] 62 (10/11 1911) Resp:  [15-20] 15 (10/11 1911) BP: (86-151)/(41-73) 124/66 (10/11 1911) SpO2:  [76 %-100 %] 98 % (10/11 1911)     Height: '5\' 9"'$  (175.3 cm) Weight: 65.9 kg BMI (Calculated): 21.44   Physical Exam:  Constitutional: alert, cooperative and no distress  Respiratory: breathing non-labored at rest  Cardiovascular: regular rate and sinus rhythm  Gastrointestinal: soft, non-tender, and distended  Labs:     Latest Ref Rng & Units 01/15/2022    5:45 AM 01/14/2022    5:16 AM 01/13/2022    5:28 AM  CBC  WBC 4.0 - 10.5 K/uL 16.1  14.5  12.2   Hemoglobin 13.0 - 17.0 g/dL 7.6  8.8  7.9   Hematocrit 39.0 - 52.0 % 24.2  27.3  24.8   Platelets 150 - 400 K/uL 176  279  221       Latest Ref Rng & Units 01/15/2022    5:45 AM 01/14/2022    5:16 AM 01/13/2022    5:28 AM  CMP  Glucose 70 - 99 mg/dL 92  108  101   BUN 8 - 23 mg/dL 65  66  73   Creatinine 0.61 - 1.24 mg/dL 4.68  4.53  4.97   Sodium 135 - 145 mmol/L 134  134  138   Potassium 3.5 - 5.1 mmol/L 4.0  3.2  3.2   Chloride 98 - 111 mmol/L 96  96  97   CO2 22 - 32 mmol/L '27  28  29   '$ Calcium 8.9 - 10.3 mg/dL 9.3  9.5  9.5   Total Protein 6.5 - 8.1 g/dL 5.2     Total Bilirubin 0.3 - 1.2 mg/dL 0.7     Alkaline Phos 38 - 126 U/L 41     AST 15 - 41 U/L 23     ALT 0 - 44 U/L 7       Imaging studies:  CT scan of the abdomen and pelvis last night showed bowel dilation without transition point.  No sign of obstruction.  No free air or free  fluid.   Assessment/Plan:  76 y.o. male with small bowel obstruction 8 Day Post-Op s/p reduction of internal hernia.  The was complicated by midline wound dehiscence status post wound closure postop day #5.   -Postoperative ileus.  No sign of obstruction on CT scan. -I personally remove the NGT this morning.  Abdomen x-ray showed NGT in stomach -Continue TPN -Continue NGT to low intermittent suction -Keep electrolyte within normal limits -I will continue to follow  Arnold Long, MD

## 2022-01-15 NOTE — Assessment & Plan Note (Addendum)
Following exploratory laparotomy, wound dehisced requiring patient to go back to the OR on 10/6.  Surgery following

## 2022-01-15 NOTE — Consult Note (Signed)
PHARMACY - TOTAL PARENTERAL NUTRITION CONSULT NOTE   Indication: Prolonged ileus  Patient Measurements: Height: '5\' 9"'$  (175.3 cm) Weight: 65.9 kg (145 lb 4.5 oz) IBW/kg (Calculated) : 70.7 TPN AdjBW (KG): 73 Body mass index is 21.45 kg/m.   Assessment: 76 y.o. male with medical history significant of atonic bladder s/p suprapubic catheter, CKD Stage 4, anemia of chronic kidney disease, hypertension, gout found with small bowel obstruction 7 Day Post-Op s/p reduction of internal hernia.  This was complicated by midline wound dehiscence status post wound closure postop day #5. Failed PICC placement 10/10 (IV team), will try placement with IR today.    Glucose / Insulin: currrently controlled, BG<120 Electrolytes: Na 134 (L), chloride 96 (L), Phos 4.7 (H) Renal: Scr 4.68 (baseline 2.5-3, per nephrology no HD needed), BUN 65 Hepatic: pending Intake / Output; MIVF: +2.8L GI Imaging:  -CT 10/11: Persistent small bowel dilatation with fluid-filled loops.  -CT 10/9:  findings favor ileus versus a partial small bowel obstruction, GI Surgeries / Procedures:  -Ex Lap 10/6: closure of opened abdominal incision wound -Ex Lap 10/3: reduction of internal hernia   Central access: pending 10/11 TPN start date: pending 10/11  Nutritional Goals: Goal TPN rate is 80 mL/hr (provides 102 g of protein and 1962 kcals per day)  RD Assessment: Estimated Needs Total Energy Estimated Needs: 1800-2200kcal/day Total Protein Estimated Needs: 90-105g/day Total Fluid Estimated Needs: 1.7-2.0L/day  Current Nutrition:  NPO  Plan:  Start TPN at 57m/hr at 1800 Electrolytes in TPN: Na 523m/L, K 7015mL, Ca 5mE39m, Mg 5mEq68m and Phos 0mmol13m Cl:Ac 1:1 Add standard MVI and trace elements to TPN Add Thiamine '100mg'$  x3 days (day 1 of 3) Initiate Sensitive q6h SSI and adjust as needed  Reduce MIVF to 35 mL/hr at 1800 Monitor TPN labs on Mon/Thurs, and as needed until stable  JustinAlison Murray/2023,9:50 AM

## 2022-01-15 NOTE — Progress Notes (Signed)
[facility] Progress Note  Patient: Troy Berry.    TFT:[id number]  DOA: 01/03/2022    Date of Service: the patient was seen and examined on 01/15/2022  Brief hospital course: 76 year old male past medical history have atonic bladder status post suprapubic catheter plus stage IV chronic kidney disease and secondary hypertension who presented to the emergency room on 9/30 with complaints of suprapubic catheter dysfunction as well as abdominal distention.  Patient found to have possible SBO versus ileus.  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  Started on cefepime for possible UTI.  NG tube placed for abdominal distention.  General surgery and urology consulted.  By 10/3, noted to have worsening renal function and nephrology consulted.  No improvement in small bowel obstruction and patient taken to the OR for ex lap and reduction of internal hernia.  Despite intervention, patient's obstruction has not resolved.  Decision made to start patient on TPN with PICC line placed 10/11.  Course also complicated by wound dehiscence and exposed omentum at laparotomy site requiring repeat surgery.   Assessment and Plan: Assessment and Plan: * Acute renal failure superimposed on stage 4 chronic kidney disease ([place]) Patient has a history of CKD stage IV with most recent GFR ranging between 24 and 17.  On admission, creatinine elevated at 4.85 compared to prior 3.5.  This is likely secondary to obstructive uropathy in the setting of suprapubic catheter dysfunction.  Urology has replaced suprapubic catheter with cystogram confirming tip within bladder.   Renal function continued to worsen, likely in the setting of patient swab obstruction and recurrent hypotension.  Nephrology following, patient not felt to be candidate for dialysis.  Now relatively stable with GFR around 12, continues to have good urine output.  Suprapubic catheter  dysfunction [facility]) Patient has a history of atonic bladder requiring suprapubic catheter placement approximately 1 month ago on 12/05/2021.  On 12/30/2021, catheter was replaced by IR.  Since that time, patient has been experiencing difficulties including hematuria and poor urinary drainage.  He was reevaluated by urology and IR on 9/27 without any improvement.  On admission, it was discovered that suprapubic catheter tip was not in the bladder but adjacent to the small bowel.  Urology consulted and suprapubic catheter replaced with cystogram demonstrating appropriate placement within bladder.  - Urology following; appreciate their recommendations  SBO (small bowel obstruction) ([place]) Initial CT renal study with evidence of some small bowel dilation with no focal point demonstrated.  CT cystogram demonstrates persistent small bowel dilation with evidence of focal transition and in the central mesentery.  Findings are consistent with small bowel obstruction.  General surgery consulted and NG tube placed.  With no improvement, patient taken to the OR for ex lap and reduction of hernia on 10/3.  Obstruction remaining persistent and patient to be started on TPN.  PICC line placed.  Anemia Patient has a history of anemia of chronic kidney disease on oral iron supplementation.  Hemoglobin on admission below baseline at 7.7.  Likely in the setting of large volume hematuria over the previous 5 days prior to admission.  Hemoglobin down to 7.0 on 9/30 and transfused 1 unit of packed red blood cells.  Continue iron supplementation  Hematuria Secondary to suprapubic catheter dysfunction.  Urinary tract infection Secondary to obstructive uropathy.  Has been on cefepime since admission.  Procalcitonin on 10/3 elevated at 8.6 (no previous reference.)  Repeat labs in the morning.  Completed course  of cefepime.  Hyponatremia-resolved as of 01/06/2022 Sodium decreased at 128 on admission. Suspect this is secondary to  acute on chronic renal failure.  Given gross hematuria, unable to obtain urine studies for further evaluation.  Following hydration, sodium up to 814  Metabolic acidosis Secondary to acute on chronic renal failure with elevated BUN.  No indication for bicarb at this time.  Essential hypertension Patient currently takes amlodipine, carvedilol, and Lasix for hypertension.    - Restart home amlodipine and carvedilol - Holding home Lasix given AKI  Patient started to have recurrent hypotension and antihypertensive stopped and IV fluids restarted  Wound dehiscence Following exploratory laparotomy, wound dehisced requiring patient to go back to the OR on 10/6.  Protein-calorie malnutrition, severe Nutrition Status: Nutrition Problem: Severe Malnutrition Etiology: chronic illness (dementia, CKD, internal hernia) Signs/Symptoms: severe fat depletion, severe muscle depletion   Starting on TPN    Hypokalemia Hesitant to aggressively replace given acute on chronic advanced renal failure.  Potassium 2.8.  Dementia without behavioral disturbance (HCC) Stable at baseline       Body mass index is 21.45 kg/m.  Nutrition Problem: Severe Malnutrition Etiology: chronic illness (dementia, CKD, internal hernia)     Consultants: Urology General surgery Nephrology  Procedures: Transfusion 1 unit packed red blood cells Replacement of Foley catheter Exploratory laparotomy with reduction of hernia Wound closure for wound dehiscence 10/6 PICC line placement 10/11  Antimicrobials: IV cefepime 9/30-10/6  Code Status: Full code   Subjective: Somnolent  Objective: Vital signs were reviewed and unremarkable. Vitals:   01/15/22 1221 01/15/22 1705  BP: 95/63 (!) 151/66  Pulse: (!) 59 71  Resp: 17 17  Temp: 97.9 F (36.6 C) 97.9 F (36.6 C)  SpO2: 94% 100%    Intake/Output Summary (Last 24 hours) at 01/15/2022 1736 Last data filed at 01/15/2022 1700 Gross per 24 hour   Intake --  Output 1250 ml  Net -1250 ml   Filed Weights   01/11/22 0603 01/12/22 0500 01/13/22 0458  Weight: 64.5 kg 66 kg 65.9 kg   Body mass index is 21.45 kg/m.  Exam:  General: Somnolent, no acute distress HEENT: Normocephalic, atraumatic, mucous membranes slightly dry Cardiovascular: Regular rhythm, S1-S2, borderline bradycardia Respiratory: Clear to auscultation bilaterally Abdomen: moderately distended, absent bowel sounds, minimal tenderness Musculoskeletal: No clubbing or cyanosis, trace pitting edema Skin: Skin breaks, tears or lesions Psychiatry: Underlying dementia, no acute psychoses Neurology: No focal deficits  Data Reviewed: Hemoglobin is 7.6, white blood cell count of 16.1 with creatinine of 4.68 and GFR of 12  Disposition:  Status is: Inpatient Remains inpatient appropriate because:  -Follow-up on bowel awakening -Evaluation of infection -Tolerance of TPN    Anticipated discharge date: Unknown  Family Communication: Left message for family DVT Prophylaxis: Place and maintain sequential compression device Start: 01/08/22 1444 SCDs Start: 01/03/22 1631    Author: Annita Brod ,MD 01/15/2022 5:36 PM  To reach On-call, see care teams to locate the attending and reach out via [email or web address]. Between 7PM-7AM, please contact night-coverage If you still have difficulty reaching the attending provider, please page the [facility] (Director on Call) for [facility] on amion for assistance.

## 2022-01-15 NOTE — Progress Notes (Signed)
PT Cancellation Note  Patient Details Name: Troy Berry. MRN: [id number] DOB: 14-Oct-1945   Cancelled Treatment:    Reason Eval/Treat Not Completed: Other (comment) Spoke with nurse and pt's wife.  He had recently come back from having PICC placed, has continues to have AMS and both agree that it is not a good day/time to work with him.  Will be getting TPN starting tonight and may be more appropriate tomorrow.  Kreg Shropshire, DPT 01/15/2022, 5:14 PM

## 2022-01-16 DIAGNOSIS — N179 Acute kidney failure, unspecified: Secondary | ICD-10-CM | POA: Diagnosis not present

## 2022-01-16 DIAGNOSIS — K56609 Unspecified intestinal obstruction, unspecified as to partial versus complete obstruction: Secondary | ICD-10-CM | POA: Diagnosis not present

## 2022-01-16 DIAGNOSIS — F0390 Unspecified dementia without behavioral disturbance: Secondary | ICD-10-CM | POA: Diagnosis not present

## 2022-01-16 DIAGNOSIS — E43 Unspecified severe protein-calorie malnutrition: Secondary | ICD-10-CM | POA: Diagnosis not present

## 2022-01-16 LAB — CBC
HCT: 21.1 % — ABNORMAL LOW (ref 39.0–52.0)
Hemoglobin: 6.6 g/dL — ABNORMAL LOW (ref 13.0–17.0)
MCH: 26.4 pg (ref 26.0–34.0)
MCHC: 31.3 g/dL (ref 30.0–36.0)
MCV: 84.4 fL (ref 80.0–100.0)
Platelets: 185 K/uL (ref 150–400)
RBC: 2.5 MIL/uL — ABNORMAL LOW (ref 4.22–5.81)
RDW: 14.8 % (ref 11.5–15.5)
WBC: 9.8 K/uL (ref 4.0–10.5)
nRBC: 0 % (ref 0.0–0.2)

## 2022-01-16 LAB — COMPREHENSIVE METABOLIC PANEL
ALT: 7 U/L (ref 0–44)
AST: 16 U/L (ref 15–41)
Albumin: 2.1 g/dL — ABNORMAL LOW (ref 3.5–5.0)
Alkaline Phosphatase: 41 U/L (ref 38–126)
Anion gap: 9 (ref 5–15)
BUN: 70 mg/dL — ABNORMAL HIGH (ref 8–23)
CO2: 29 mmol/L (ref 22–32)
Calcium: 9.1 mg/dL (ref 8.9–10.3)
Chloride: 97 mmol/L — ABNORMAL LOW (ref 98–111)
Creatinine, Ser: 4.91 mg/dL — ABNORMAL HIGH (ref 0.61–1.24)
GFR, Estimated: 11 mL/min — ABNORMAL LOW (ref >60)
Glucose, Bld: 117 mg/dL — ABNORMAL HIGH (ref 70–99)
Potassium: 3.7 mmol/L (ref 3.5–5.1)
Sodium: 135 mmol/L (ref 135–145)
Total Bilirubin: 0.5 mg/dL (ref 0.3–1.2)
Total Protein: 4.8 g/dL — ABNORMAL LOW (ref 6.5–8.1)

## 2022-01-16 LAB — MAGNESIUM: Magnesium: 2.3 mg/dL (ref 1.7–2.4)

## 2022-01-16 LAB — TRIGLYCERIDES: Triglycerides: 116 mg/dL (ref <150)

## 2022-01-16 LAB — GLUCOSE, CAPILLARY
Glucose-Capillary: 102 mg/dL — ABNORMAL HIGH (ref 70–99)
Glucose-Capillary: 117 mg/dL — ABNORMAL HIGH (ref 70–99)
Glucose-Capillary: 134 mg/dL — ABNORMAL HIGH (ref 70–99)

## 2022-01-16 LAB — PHOSPHORUS: Phosphorus: 4.7 mg/dL — ABNORMAL HIGH (ref 2.5–4.6)

## 2022-01-16 LAB — PREPARE RBC (CROSSMATCH)

## 2022-01-16 MED ORDER — SODIUM CHLORIDE 0.9% IV SOLUTION: Freq: Once | INTRAVENOUS | Status: AC

## 2022-01-16 MED ORDER — TRAVASOL 10 % IV SOLN
INTRAVENOUS | Status: AC
  Filled 2022-01-16: qty 1017.6

## 2022-01-16 NOTE — Progress Notes (Signed)
Physical Therapy Treatment Patient Details Name: Troy Berry. MRN: [id number] DOB: 08-14-1945 Today's Date: 01/16/2022   History of Present Illness Patient is a 76 y.o.  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 He was admitted on 01/03/2022 for Acute on chronic renal failure, small bowel obstruction  s/p reduction of internal hernia. patient with midline wound dehiscence status post wound closure on 10/6.    PT Comments    Pt resting in bed upon PT arrival; pt's wife and sitter present; B mitts donned.  Attempted LE ex's (performed AAROM to PROM B LE heelslides and hip aBd/aDduction) but deferred further ex's d/t pt's significant difficulty following cues.  Pt did give therapist a "high five" with R UE but unable to duplicate on R and pt unable to perform with cueing on L.  Pt followed minimal cues and unable to understand most of pt's speech (sitter reports pt's speech more understandable this morning and did better following cues).  Deferred further activity (OOB mobility) d/t safety concerns; anticipate pt may do better with morning therapy session.    Recommendations for follow up therapy are one component of a multi-disciplinary discharge planning process, led by the attending physician.  Recommendations may be updated based on patient status, additional functional criteria and insurance authorization.  Follow Up Recommendations  Skilled nursing-short term rehab (<3 hours/day) Can patient physically be transported by private vehicle: No   Assistance Recommended at Discharge Frequent or constant Supervision/Assistance  Patient can return home with the following Two people to help with walking and/or transfers;A lot of help with bathing/dressing/bathroom;Assist for transportation   Equipment Recommendations   (TBD at next facility)    Recommendations for Other Services        Precautions / Restrictions Precautions Precautions: Fall Precaution Comments: suprapubic catheter; abdominal incision; gastric tube Restrictions Weight Bearing Restrictions: No     Mobility  Bed Mobility Overal bed mobility:  (Deferred d/t safety concerns)                  Transfers                        Ambulation/Gait                   Stairs             Wheelchair Mobility    Modified Rankin (Stroke Patients Only)       Balance                                            Cognition Arousal/Alertness: Awake/alert Behavior During Therapy: Impulsive Overall Cognitive Status: Impaired/Different from baseline Area of Impairment: Following commands, Problem solving                       Following Commands: Follows one step commands inconsistently     Problem Solving: Slow processing, Decreased initiation, Difficulty sequencing, Requires verbal cues, Requires tactile cues General Comments: Unable to understand most of pt's verbalizations.        Exercises General Exercises - Lower Extremity Heel Slides: AAROM, PROM, Strengthening, Both, 10 reps, Supine Hip ABduction/ADduction: AAROM, PROM, Strengthening, Both, 10 reps, Supine Other Exercises Other Exercises: x30 seconds B heel cord stretching    General Comments  Pertinent Vitals/Pain Pain Assessment Pain Assessment: Faces Pain Score: 0-No pain Pain Intervention(s): Limited activity within patient's tolerance, Monitored during session, Repositioned    Home Living                          Prior Function            PT Goals (current goals can now be found in the care plan section) Acute Rehab PT Goals Patient Stated Goal: patient unable to particiapte with goal setting today PT Goal Formulation: With family Time For Goal Achievement: 01/24/22 Potential to Achieve Goals: Fair Progress towards PT goals: Not progressing toward  goals - comment (d/t difficulty following cues)    Frequency    Min 2X/week      PT Plan Current plan remains appropriate    Co-evaluation              AM-PAC PT "6 Clicks" Mobility   Outcome Measure  Help needed turning from your back to your side while in a flat bed without using bedrails?: A Lot Help needed moving from lying on your back to sitting on the side of a flat bed without using bedrails?: A Lot Help needed moving to and from a bed to a chair (including a wheelchair)?: Total Help needed standing up from a chair using your arms (e.g., wheelchair or bedside chair)?: Total Help needed to walk in hospital room?: Total Help needed climbing 3-5 steps with a railing? : Total 6 Click Score: 8    End of Session   Activity Tolerance: Patient tolerated treatment well Patient left: in bed;with call bell/phone within reach;with bed alarm set;with nursing/sitter in room;with family/visitor present;Other (comment) Freight forwarder present) Nurse Communication: Precautions PT Visit Diagnosis: Unsteadiness on feet (R26.81);Muscle weakness (generalized) (M62.81)     Time: 1535-1550 PT Time Calculation (min) (ACUTE ONLY): 15 min  Charges:  $Therapeutic Exercise: 8-22 mins                     Leitha Bleak, PT 01/16/22, 4:21 PM

## 2022-01-16 NOTE — Progress Notes (Signed)
[facility] Progress Note  Patient: Troy Berry.    BWI:[id number]  DOA: 01/03/2022    Date of Service: the patient was seen and examined on 01/16/2022  Brief hospital course: 76 year old male past medical history have atonic bladder status post suprapubic catheter plus stage IV chronic kidney disease and secondary hypertension who presented to the emergency room on 9/30 with complaints of suprapubic catheter dysfunction as well as abdominal distention.  Patient found to have possible SBO versus ileus.  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  Started on cefepime for possible UTI.  NG tube placed for abdominal distention.  General surgery and urology consulted.  By 10/3, noted to have worsening renal function and nephrology consulted.  No improvement in small bowel obstruction and patient taken to the OR for ex lap and reduction of internal hernia.  Despite intervention, patient's obstruction has not resolved.  Decision made to start patient on TPN with PICC line placed 10/11.  Course also complicated by wound dehiscence and exposed omentum at laparotomy site requiring return to the OR for wound closure on 10/9.  Hemoglobin down to 6.610/12 and patient received 1 unit packed red blood cells.   Assessment and Plan: Assessment and Plan: * Acute renal failure superimposed on stage 4 chronic kidney disease ([place]) Patient has a history of CKD stage IV with most recent GFR ranging between 24 and 17.  On admission, creatinine elevated at 4.85 compared to prior 3.5.  This is likely secondary to obstructive uropathy in the setting of suprapubic catheter dysfunction.  Urology has replaced suprapubic catheter with cystogram confirming tip within bladder.   Renal function continued to worsen, likely in the setting of small bowel obstruction and recurrent hypotension.  Nephrology following, patient not felt to be candidate for dialysis.   Now relatively stable with GFR around 12, continues to have good urine output.  SBO (small bowel obstruction) (HCC) Initial CT renal study with evidence of some small bowel dilation with no focal point demonstrated.  CT cystogram demonstrates persistent small bowel dilation with evidence of focal transition and in the central mesentery.  Findings are consistent with small bowel obstruction.  General surgery consulted and NG tube placed.  With no improvement, patient taken to the OR for ex lap and reduction of hernia on 10/3.  Obstruction remaining persistent and patient to be started on TPN.  PICC line placed.  Suprapubic catheter dysfunction [facility]) Patient has a history of atonic bladder requiring suprapubic catheter placement approximately 1 month ago on 12/05/2021.  On 12/30/2021, catheter was replaced by IR.  Since that time, patient has been experiencing difficulties including hematuria and poor urinary drainage.  He was reevaluated by urology and IR on 9/27 without any improvement.  On admission, it was discovered that suprapubic catheter tip was not in the bladder but adjacent to the small bowel.  Urology consulted and suprapubic catheter replaced with cystogram demonstrating appropriate placement within bladder.  - Urology following; appreciate their recommendations  Anemia Patient has a history of anemia of chronic kidney disease on oral iron supplementation.  Hemoglobin on admission below baseline at 7.7.  Likely in the setting of large volume hematuria over the previous 5 days prior to admission.  On 9/30 and 10/12, hemoglobin at 7.0 or less and patient transfused 1 unit packed red blood cells each date.    Hematuria Secondary to suprapubic catheter dysfunction.  Urinary tract infection Secondary to obstructive uropathy.  Has  been on cefepime since admission.  Procalcitonin on 10/3 elevated at 8.6 (no previous reference.)  Repeat labs in the morning.  Completed course of  cefepime.  Hyponatremia-resolved as of 01/06/2022 Sodium decreased at 128 on admission. Suspect this is secondary to acute on chronic renal failure.  Given gross hematuria, unable to obtain urine studies for further evaluation.  Following hydration, sodium up to 865  Metabolic acidosis Secondary to acute on chronic renal failure with elevated BUN.  No indication for bicarb at this time.  Essential hypertension Patient currently takes amlodipine, carvedilol, and Lasix for hypertension.    - Restart home amlodipine and carvedilol - Holding home Lasix given AKI  Patient started to have recurrent hypotension and antihypertensive stopped and IV fluids restarted  Wound dehiscence Following exploratory laparotomy, wound dehisced requiring patient to go back to the OR on 10/6.  Protein-calorie malnutrition, severe Nutrition Status: Nutrition Problem: Severe Malnutrition Etiology: chronic illness (dementia, CKD, internal hernia) Signs/Symptoms: severe fat depletion, severe muscle depletion   Starting on TPN    Hypokalemia Hesitant to aggressively replace given acute on chronic advanced renal failure.  Potassium 2.8.  Dementia without behavioral disturbance (HCC) Stable at baseline       Body mass index is 21.29 kg/m.  Nutrition Problem: Severe Malnutrition Etiology: chronic illness (dementia, CKD, internal hernia)     Consultants: Urology General surgery Interventional radiology Nephrology  Procedures: Transfusion 1 unit packed red blood cells 9/30 and 10/12 Replacement of Foley catheter Exploratory laparotomy with reduction of hernia Wound closure for wound dehiscence 10/6 PICC line placement 10/11  Antimicrobials: IV cefepime 9/30-10/6  Code Status: Full code   Subjective: Does not respond to me currently, more somnolent  Objective: Vital signs were reviewed and unremarkable. Vitals:   01/16/22 1107 01/16/22 1358  BP: (!) 138/50 (!) 153/62  Pulse: 70  (!) 58  Resp: 16 18  Temp: 97.9 F (36.6 C) 98.2 F (36.8 C)  SpO2: 100% 99%    Intake/Output Summary (Last 24 hours) at 01/16/2022 1717 Last data filed at 01/16/2022 1411 Gross per 24 hour  Intake 1916.24 ml  Output 1975 ml  Net -58.76 ml    Filed Weights   01/12/22 0500 01/13/22 0458 01/16/22 0455  Weight: 66 kg 65.9 kg 65.4 kg   Body mass index is 21.29 kg/m.  Exam:  General: Somnolent, no acute distress HEENT: Normocephalic, atraumatic, mucous membranes slightly dry Cardiovascular: Regular rhythm, S1-S2, borderline bradycardia Respiratory: Clear to auscultation bilaterally Abdomen: moderately distended, absent bowel sounds, minimal tenderness Musculoskeletal: No clubbing or cyanosis, trace pitting edema Skin: Skin breaks, tears or lesions other than surgical site which is clean dry and intact Psychiatry: Underlying dementia, no acute psychoses Neurology: No focal deficits  Data Reviewed: Hemoglobin is 7.6, white blood cell count of 16.1 with creatinine of 4.68 and GFR of 12  Disposition:  Status is: Inpatient Remains inpatient appropriate because:  -Follow-up on bowel awakening -Evaluation of infection -Tolerance of TPN    Anticipated discharge date: Unknown  Family Communication: Left message for family DVT Prophylaxis: Place and maintain sequential compression device Start: 01/08/22 1444 SCDs Start: 01/03/22 1631    Author: Annita Brod ,MD 01/16/2022 5:17 PM  To reach On-call, see care teams to locate the attending and reach out via [email or web address]. Between 7PM-7AM, please contact night-coverage If you still have difficulty reaching the attending provider, please page the [facility] (Director on Call) for [facility] on amion for assistance.

## 2022-01-16 NOTE — Progress Notes (Signed)
Patient ID: Troy Beals., male   DOB: [birth year], 77 y.o.   MRN: [id number]     [place] Hospital Day(s): 12.   Interval History: Patient seen and examined.  As per nurse she was told that patient had a bloody bowel movement and hemoglobin decreased to 6.6.  There was further soft infusion.  No bowel movement is charted on the flowsheet.  Patient seems to be comfortable resting in bed.  He is alert and more active than usual.  He is trying to answer question with simple warts.  Vital signs in last 24 hours: [min-max] current  Temp:  [97.6 F (36.4 C)-98.2 F (36.8 C)] 98.2 F (36.8 C) (10/12 1358) Pulse Rate:  [58-71] 58 (10/12 1358) Resp:  [14-20] 18 (10/12 1358) BP: (121-153)/(50-66) 153/62 (10/12 1358) SpO2:  [97 %-100 %] 99 % (10/12 1358) Weight:  [65.4 kg] 65.4 kg (10/12 0455)     Height: '5\' 9"'$  (175.3 cm) Weight: 65.4 kg BMI (Calculated): 21.28   Physical Exam:  Constitutional: alert, cooperative and no distress  Respiratory: breathing non-labored at rest  Cardiovascular: regular rate and sinus rhythm  Gastrointestinal: soft, non-tender, and mild-distended  Labs:     Latest Ref Rng & Units 01/16/2022    4:03 AM 01/15/2022    5:45 AM 01/14/2022    5:16 AM  CBC  WBC 4.0 - 10.5 K/uL 9.8  16.1  14.5   Hemoglobin 13.0 - 17.0 g/dL 6.6  7.6  8.8   Hematocrit 39.0 - 52.0 % 21.1  24.2  27.3   Platelets 150 - 400 K/uL 185  176  279       Latest Ref Rng & Units 01/16/2022    4:03 AM 01/15/2022    5:45 AM 01/14/2022    5:16 AM  CMP  Glucose 70 - 99 mg/dL 117  92  108   BUN 8 - 23 mg/dL 70  65  66   Creatinine 0.61 - 1.24 mg/dL 4.91  4.68  4.53   Sodium 135 - 145 mmol/L 135  134  134   Potassium 3.5 - 5.1 mmol/L 3.7  4.0  3.2   Chloride 98 - 111 mmol/L 97  96  96   CO2 22 - 32 mmol/L '29  27  28   '$ Calcium 8.9 - 10.3 mg/dL 9.1  9.3  9.5   Total Protein 6.5 - 8.1 g/dL 4.8  5.2    Total Bilirubin 0.3 - 1.2 mg/dL 0.5  0.7    Alkaline Phos 38 - 126 U/L 41   41    AST 15 - 41 U/L 16  23    ALT 0 - 44 U/L 7  7      Imaging studies: No new pertinent imaging studies   Assessment/Plan:  76 y.o. male with small bowel obstruction 9 Day Post-Op s/p reduction of internal hernia.  The was complicated by midline wound dehiscence status post wound closure postop day #6.   -Postoperative ileus.  No sign of obstruction on CT scan. -NGT output was 1250. -The abdomen is softer. -I do not have any evidence of bowel movement.  We will continue with NGT in place. -Agree with TPN.  Agree with transfusion due to hemoglobin of 6.6. -I will continue to follow closely.   Arnold Long, MD

## 2022-01-16 NOTE — Progress Notes (Signed)
[facility]  ROUNDING NOTE   Subjective:   Patient seen resting quietly, wife at bedside TPN infusing with IVF NG tube remains in place with moderate amount of drainage Suprapubic catheter with pink-tinged urine  Creatinine 4.91 UOP 933m via suprapubic catheter   Objective:  Vital signs in last 24 hours:  Temp:  [97.6 F (36.4 C)-98 F (36.7 C)] 97.9 F (36.6 C) (10/12 1107) Pulse Rate:  [59-71] 70 (10/12 1107) Resp:  [14-20] 16 (10/12 1107) BP: (121-151)/(50-66) 138/50 (10/12 1107) SpO2:  [97 %-100 %] 100 % (10/12 1107) Weight:  [65.4 kg] 65.4 kg (10/12 0455)  Weight change:  Filed Weights   01/12/22 0500 01/13/22 0458 01/16/22 0455  Weight: 66 kg 65.9 kg 65.4 kg    Intake/Output: I/O last 3 completed shifts: In: 815.1 [I.V.:815.1] Out: 2725 [[RDEYC:1448 Emesis/NG output:1250]   Intake/Output this shift:  Total I/O In: 10 [I.V.:10] Out: -   Physical Exam: General: NAD  Head: Normocephalic, atraumatic.   Eyes: Anicteric  Lungs:  Clear to auscultation, normal effort  Heart: Regular rate and rhythm  Abdomen:  Soft, nontender  Extremities:  No peripheral edema.  Neurologic: Nonfocal, moving all four extremities  Skin: No lesions  Access: None  GU-suprapubic catheter-hematuria  Basic Metabolic Panel: Recent Labs  Lab 01/11/22 0628 01/12/22 0656 01/13/22 0528 01/14/22 0516 01/15/22 0545 01/16/22 0403  NA 145 138 138 134* 134* 135  K 3.5 3.4* 3.2* 3.2* 4.0 3.7  CL 101 94* 97* 96* 96* 97*  CO2 33* 32 _0 GLUCOSE 165* 119* 101* 108* 92 117*  BUN 83* 78* 73* 66* 65* 70*  CREATININE 4.83* 4.85* 4.97* 4.53* 4.68* 4.91*  CALCIUM 9.6 9.9 9.5 9.5 9.3 9.1  MG 2.4 2.3 2.3  --  2.3 2.3  PHOS 6.1* 4.9* 5.0*  --  4.7* 4.7*     Liver Function Tests: Recent Labs  Lab 01/15/22 0545 01/16/22 0403  AST 23 16  ALT 7 7  ALKPHOS 41 41  BILITOT 0.7 0.5  PROT 5.2* 4.8*  ALBUMIN 2.3* 2.1*    No results for input(s): "LIPASE",  "AMYLASE" in the last 168 hours. No results for input(s): "AMMONIA" in the last 168 hours.  CBC: Recent Labs  Lab 01/12/22 0656 01/13/22 0528 01/14/22 0516 01/15/22 0545 01/16/22 0403  WBC 18.7* 12.2* 14.5* 16.1* 9.8  HGB 8.5* 7.9* 8.8* 7.6* 6.6*  HCT 27.0* 24.8* 27.3* 24.2* 21.1*  MCV 85.7 84.4 83.7 86.4 84.4  PLT 260 221 279 176 185     Cardiac Enzymes: No results for input(s): "CKTOTAL", "CKMB", "CKMBINDEX", "TROPONINI" in the last 168 hours.  BNP: Invalid input(s): "POCBNP"  CBG: Recent Labs  Lab 01/14/22 2336 01/15/22 1221 01/15/22 1700 01/15/22 2339 01/16/22 0507  GLUCAP 107* 75 105* 106* 102*     Microbiology: Results for orders placed or performed during the hospital encounter of 01/03/22  Urine Culture     Status: None   Collection Time: 01/05/22  8:56 AM   Specimen: Urine, Suprapubic  Result Value Ref Range Status   Specimen Description   Final    URINE, SUPRAPUBIC [facility] [address], [place] [address]   Special Requests   Final    NONE [facility]formed at [facility] [address] [place] [address]   Culture   Final    NO GROWTH [facility]formed at [facility] [address] [place]  [address]  Report Status 01/06/2022 FINAL  Final    Coagulation Studies: No results for input(s): "LABPROT", "INR" in the last 72 hours.  Urinalysis: No results for input(s): "COLORURINE", "LABSPEC", "PHURINE", "GLUCOSEU", "HGBUR", "BILIRUBINUR", "KETONESUR", "PROTEINUR", "UROBILINOGEN", "NITRITE", "LEUKOCYTESUR" in the last 72 hours.  Invalid input(s): "APPERANCEUR"    Imaging: IR PICC PLACEMENT RIGHT >5 YRS INC IMG GUIDE  Result Date: 01/15/2022 INDICATION: Patient requiring TPN. Interventional radiology asked to place a PICC. EXAM: ULTRASOUND AND FLUOROSCOPIC GUIDED PICC LINE INSERTION MEDICATIONS: 1% lidocaine CONTRAST:  None FLUOROSCOPY TIME:  Radiation exposure index as provided  by the fluoroscopic device 19.3 mGy kerma COMPLICATIONS: None immediate. TECHNIQUE: The procedure, risks, benefits, and alternatives were explained to the patient and informed written consent was obtained. The right upper extremity was prepped with chlorhexidine in a sterile fashion, and a sterile drape was applied covering the operative field. Maximum barrier sterile technique with sterile gowns and gloves were used for the procedure. A timeout was performed prior to the initiation of the procedure. Local anesthesia was provided with 1% lidocaine. After the overlying soft tissues were anesthetized with 1% lidocaine, a micropuncture kit was utilized to access the right brachial vein. Real-time ultrasound guidance was utilized for vascular access including the acquisition of a permanent ultrasound image documenting patency of the accessed vessel. A guidewire was advanced to the level of the superior caval-atrial junction for measurement purposes and the PICC line was cut to length. A peel-away sheath was placed and a 36 cm, 5 [place], dual lumen was inserted to level of the superior caval-atrial junction. A post procedure spot fluoroscopic was obtained. The catheter easily aspirated and flushed and was secured in place. A dressing was placed. The patient tolerated the procedure well without immediate post procedural complication. FINDINGS: After catheter placement, the tip lies within the superior cavoatrial junction. The catheter aspirates and flushes normally and is ready for immediate use. IMPRESSION: Successful ultrasound and fluoroscopic guided placement of a right brachial vein approach, 36 cm, 5 French, dual lumen PICC with tip at the superior caval-atrial junction. The PICC line is ready for immediate use. Read by: Soyla Dryer, NP Electronically Signed   By: Aletta Edouard M.D.   On: 01/15/2022 15:52   DG Abd 1 View  Result Date: 01/15/2022 CLINICAL DATA:  Nasogastric tube present EXAM: ABDOMEN - 1  VIEW COMPARISON:  None Available. FINDINGS: Gastric tube courses below the diaphragm with the tip in the expected region stomach. Bowel dilation and bibasilar opacities, better evaluated on same day CT. IMPRESSION: Gastric tube courses below the diaphragm with the tip in the expected region stomach. Electronically Signed   By: Margaretha Sheffield M.D.   On: 01/15/2022 09:39   CT ABDOMEN PELVIS WO CONTRAST  Result Date: 01/15/2022 CLINICAL DATA:  Acute nonlocalized abdominal pain EXAM: CT ABDOMEN AND PELVIS WITHOUT CONTRAST TECHNIQUE: Multidetector CT imaging of the abdomen and pelvis was performed following the standard protocol without IV contrast. RADIATION DOSE REDUCTION: This exam was performed according to the departmental dose-optimization program which includes automated exposure control, adjustment of the mA and/or kV according to patient size and/or use of iterative reconstruction technique. COMPARISON:  01/13/2022 FINDINGS: Lower chest: Small bilateral pleural effusions with basilar atelectasis. Motion artifact limits evaluation. Hepatobiliary: Cholelithiasis with multiple stones in the gallbladder. Increased density in the bowel suggesting sludge. No inflammatory stranding. No bile duct dilatation. No focal liver lesions. Vascular calcifications. Pancreas: Unremarkable. No pancreatic ductal dilatation or surrounding inflammatory changes. Spleen: Normal in size without focal abnormality.  Adrenals/Urinary Tract: No adrenal gland nodules. Increased density in the renal hila consistent with prominent vascular calcifications. Left renal cysts, largest measuring 3.9 cm diameter. Probable hemorrhagic cysts. No change since prior study. No imaging follow-up indicated. No hydronephrosis or hydroureter. Bladder is decompressed with a suprapubic catheter in place. Stomach/Bowel: Moderately dilated gas and fluid-filled stomach and small bowel. Contrast material demonstrated throughout the colon suggest no evidence  of complete obstruction. Changes may represent partial obstruction, ileus or possibly enteritis. No wall thickening or pneumatosis identified. Vascular/Lymphatic: Diffuse vascular calcifications. No aneurysm. No significant lymphadenopathy. Reproductive: Prostate gland is enlarged. Other: Moderate diffuse abdominal and pelvic ascites. No free air. Small right inguinal hernia containing fat and fluid. Musculoskeletal: Degenerative changes in the spine and hips. No acute bony abnormalities. Old left rib fractures. IMPRESSION: 1. Persistent small bowel dilatation with fluid-filled loops. Contrast material is seen in the colon suggesting no evidence of complete obstruction. Changes may represent partial obstruction, ileus, or enteritis. 2. Small bilateral pleural effusions with basilar atelectasis. 3. Cholelithiasis without evidence of acute cholecystitis. 4. Moderate abdominal and pelvic ascites. 5. Suprapubic catheter decompresses the bladder. 6. Enlarged prostate gland. 7. Small right inguinal hernia containing fat and fluid. Electronically Signed   By: Lucienne Capers M.D.   On: 01/15/2022 01:01   DG Abd Portable 1V  Addendum Date: 01/15/2022   ADDENDUM REPORT: 01/15/2022 00:01 ADDENDUM: These results were called by telephone at the time of interpretation on 01/15/2022 at 12:00 am to provider NP KATY FOUST , who verbally acknowledged these results. Electronically Signed   By: Iven Finn M.D.   On: 01/15/2022 00:01   Result Date: 01/15/2022 CLINICAL DATA:  [id number] Abdominal pain [id number] EXAM: PORTABLE ABDOMEN - 1 VIEW COMPARISON:  CT abdomen pelvis 01/13/2022, x-ray abdomen 01/10/2022 FINDINGS: Interval worsening of small and large bowel gaseous dilatation. Unable to exclude pneumoperitoneum. No radio-opaque calculi or other significant radiographic abnormality are seen. Suprapubic catheter overlies the pelvis. Vascular calcifications. IMPRESSION: Interval worsening of small and large bowel gaseous  dilatation. Unable to exclude pneumoperitoneum. Recommend CT with intravenous contrast for further evaluation. These results will be called to the ordering clinician or representative by the Radiologist Assistant, and communication documented in the PACS or [employer]. Electronically Signed: By: Iven Finn M.D. On: 01/14/2022 23:47     Medications:    sodium chloride     dextrose 35 mL/hr at 01/16/22 0434   TPN ADULT (ION) 40 mL/hr at 01/16/22 0434   TPN ADULT (ION)      sodium chloride   Intravenous Once   Chlorhexidine Gluconate Cloth  6 each Topical Daily   furosemide  20 mg Intravenous BID   insulin aspart  0-9 Units Subcutaneous Q6H   polyethylene glycol  17 g Oral BID   sodium chloride flush  3 mL Intravenous Q12H   sodium chloride, acetaminophen **OR** acetaminophen, haloperidol lactate, hydrALAZINE, iohexol, morphine injection, sodium chloride flush  Assessment/ Plan:  Troy Berry. is a 76 y.o.  male with medical problems of atonic bladder status post suprapubic catheter placement, chronic kidney disease, anemia, hypertension, gout    was admitted on 01/03/2022 for Acute on chronic renal failure ([place]) [N17.9, N18.9] Gross hematuria [R31.0]   Acute Kidney Injury  Likely ATN due to concurrent illness, small bowel obstruction. Creatinine increased today.  Agree with IV fluids.  We will continue to monitor.  No acute need for dialysis at this time.   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  Improving   Small bowel obstruction Status post exploratory laparotomy on 01/07/2022. Experienced wound dehiscence with repair in OR on 01/10/22. TPN infusing   LOS: 12   10/12/202312:45 PM

## 2022-01-16 NOTE — Progress Notes (Signed)
OT Cancellation Note  Patient Details Name: Troy Berry. MRN: [id number] DOB: 21-Jun-1945   Cancelled Treatment:    Reason Eval/Treat Not Completed: Patient not medically ready; Low HGB; pt getting blood transfusion.  Will attempt later as time allows and as pt is medically able to participate.  [name], MS, OTR/L  Darleene Cleaver 01/16/2022, 11:17 AM

## 2022-01-16 NOTE — Consult Note (Signed)
PHARMACY - TOTAL PARENTERAL NUTRITION CONSULT NOTE   Indication: Prolonged ileus  Patient Measurements: Height: '5\' 9"'$  (175.3 cm) Weight: 65.4 kg (144 lb 2.9 oz) IBW/kg (Calculated) : 70.7 TPN AdjBW (KG): 73 Body mass index is 21.29 kg/m.   Assessment: 76 y.o. male with medical history significant of atonic bladder s/p suprapubic catheter, CKD Stage 4, anemia of chronic kidney disease, hypertension, gout found with small bowel obstruction 7 Day Post-Op s/p reduction of internal hernia.  This was complicated by midline wound dehiscence status post wound closure postop day #7.    Glucose / Insulin: currrently controlled, BG<120 Electrolytes: chloride 97 (L), Phos 4.7 (H) Renal: Scr 4.91 (baseline 2.5-3, per nephrology no HD needed), BUN 70 Hepatic: LFTs WNL Intake / Output; MIVF: +1.7L GI Imaging:  -CT 10/11: Persistent small bowel dilatation with fluid-filled loops.  -CT 10/9:  findings favor ileus versus a partial small bowel obstruction, GI Surgeries / Procedures:  -Ex Lap 10/6: closure of opened abdominal incision wound -Ex Lap 10/3: reduction of internal hernia   Central access: DL PICC placed 10/11 TPN start date: 10/11  Nutritional Goals: Goal TPN rate is 80 mL/hr (provides 102 g of protein and 1962 kcals per day)  RD Assessment: Estimated Needs Total Energy Estimated Needs: 1800-2200kcal/day Total Protein Estimated Needs: 90-105g/day Total Fluid Estimated Needs: 1.7-2.0L/day  Current Nutrition:  NPO  Plan:  Increase to goal rate of 80 ml/hr Electrolytes in TPN: Na 26mq/L, K 564m/L, Ca 20m78mL, Mg 20mE87m, and Phos 0mmo79m. Cl:Ac 1:1 Add standard MVI and trace elements to TPN Add Thiamine '100mg'$  x3 days (day 2 of 3) Initiate Sensitive q6h SSI and adjust as needed  Stop MIVF at 1800 when TPN increases to goal rate Monitor TPN labs on Mon/Thurs, and as needed until stable  JustiAlison Murray2/2023,9:54 AM

## 2022-01-17 DIAGNOSIS — N184 Chronic kidney disease, stage 4 (severe): Secondary | ICD-10-CM | POA: Diagnosis not present

## 2022-01-17 DIAGNOSIS — N179 Acute kidney failure, unspecified: Secondary | ICD-10-CM | POA: Diagnosis not present

## 2022-01-17 LAB — GLUCOSE, CAPILLARY
Glucose-Capillary: 138 mg/dL — ABNORMAL HIGH (ref 70–99)
Glucose-Capillary: 147 mg/dL — ABNORMAL HIGH (ref 70–99)
Glucose-Capillary: 122 mg/dL — ABNORMAL HIGH (ref 70–99)
Glucose-Capillary: 115 mg/dL — ABNORMAL HIGH (ref 70–99)

## 2022-01-17 LAB — CBC
HCT: 23.3 % — ABNORMAL LOW (ref 39.0–52.0)
Hemoglobin: 7.5 g/dL — ABNORMAL LOW (ref 13.0–17.0)
MCH: 27 pg (ref 26.0–34.0)
MCHC: 32.2 g/dL (ref 30.0–36.0)
MCV: 83.8 fL (ref 80.0–100.0)
Platelets: 197 10*3/uL (ref 150–400)
RBC: 2.78 MIL/uL — ABNORMAL LOW (ref 4.22–5.81)
RDW: 14.8 % (ref 11.5–15.5)
WBC: 9.1 10*3/uL (ref 4.0–10.5)
nRBC: 0 % (ref 0.0–0.2)

## 2022-01-17 LAB — TYPE AND SCREEN
ABO/RH(D): A POS
Antibody Screen: NEGATIVE
Unit division: 0

## 2022-01-17 LAB — BPAM RBC
Blood Product Expiration Date: 202311142359
ISSUE DATE / TIME: 202310121046
Unit Type and Rh: 6200

## 2022-01-17 LAB — PHOSPHORUS: Phosphorus: 3.1 mg/dL (ref 2.5–4.6)

## 2022-01-17 LAB — MAGNESIUM: Magnesium: 2.4 mg/dL (ref 1.7–2.4)

## 2022-01-17 MED ORDER — SODIUM CHLORIDE 0.9% FLUSH
10.0000 mL | INTRAVENOUS | Status: DC | PRN
  Administered 2022-01-22 – 2022-01-23 (×2): 10 mL

## 2022-01-17 MED ORDER — SODIUM CHLORIDE 0.9% FLUSH
10.0000 mL | Freq: Two times a day (BID) | INTRAVENOUS | Status: DC
  Administered 2022-01-17 – 2022-01-31 (×27): 10 mL

## 2022-01-17 MED ORDER — TRAVASOL 10 % IV SOLN
INTRAVENOUS | Status: AC
  Filled 2022-01-17: qty 1017.6

## 2022-01-17 NOTE — Progress Notes (Signed)
Physical Therapy Treatment Patient Details Name: Troy Berry. MRN: [id number] DOB: 1946/02/06 Today's Date: 01/17/2022   History of Present Illness Patient is a 76 y.o.  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 He was admitted on 01/03/2022 for Acute on chronic renal failure, small bowel obstruction  s/p reduction of internal hernia. patient with midline wound dehiscence status post wound closure on 10/6.    PT Comments    Pt resting in bed upon PT arrival; pt's wife and sitter present but left during mobility portion of session.  Pt able to state his name (not DOB) and asked therapist what her name was.  Inconsistent with following 1 step cues but improved compared to yesterday and able to initiate movement to participate intermittently.  Pt started hiccupping after sitting up on edge of bed (nurse notified; pt's wife reporting pt was hiccupping earlier this morning and yesterday too).  2 assist with bed mobility; mod assist x2 for 1/2 stand 1st trial and max assist x1 (plus CGA of 2nd for safety) 2nd trial standing (1/2 stand).  Even with cueing and assist pt staying in flexed posture in sitting and standing and only able to achieve 1/2 stand each trial standing.  Pt then requesting to lay down (pt appearing fatigued).  Will continue to focus on strengthening, balance, and progressive functional mobility per pt tolerance.   Recommendations for follow up therapy are one component of a multi-disciplinary discharge planning process, led by the attending physician.  Recommendations may be updated based on patient status, additional functional criteria and insurance authorization.  Follow Up Recommendations  Skilled nursing-short term rehab (<3 hours/day) Can patient physically be transported by private vehicle: No   Assistance Recommended at Discharge Frequent or constant Supervision/Assistance   Patient can return home with the following Two people to help with walking and/or transfers;A lot of help with bathing/dressing/bathroom;Assist for transportation;Help with stairs or ramp for entrance;Direct supervision/assist for medications management;Assistance with feeding;Assistance with cooking/housework;Direct supervision/assist for financial management   Equipment Recommendations  Other (comment) (TBD at next facility)    Recommendations for Other Services       Precautions / Restrictions Precautions Precautions: Fall Precaution Comments: suprapubic catheter; abdominal incision; gastric tube Restrictions Weight Bearing Restrictions: No     Mobility  Bed Mobility Overal bed mobility: Needs Assistance Bed Mobility: Supine to Sit     Supine to sit: Mod assist, +2 for physical assistance, HOB elevated Sit to supine: Mod assist, +2 for physical assistance, HOB elevated   General bed mobility comments: assist for trunk and B LE's; use of bed sheet to assist; vc's for technique    Transfers Overall transfer level: Needs assistance Equipment used: 2 person hand held assist Transfers: Sit to/from Stand Sit to Stand: Mod assist, +2 physical assistance, Max assist           General transfer comment: mod assist x2 first trial; max assist x1 (CGA of 2nd for safety) 2nd trial; pt only able to come to 1/2 stand both trials with max cueing and assist    Ambulation/Gait               General Gait Details: unable to at this time   Stairs             Wheelchair Mobility    Modified Rankin (Stroke Patients Only)       Balance Overall balance assessment: Needs assistance Sitting-balance support: Bilateral  upper extremity supported, Feet supported Sitting balance-[name]: Fair Sitting balance - Comments: initial posterior lean but then steady static sitting   Standing balance support: Bilateral upper extremity supported Standing balance-[name]:  Poor                              Cognition Arousal/Alertness: Awake/alert Behavior During Therapy: Flat affect Overall Cognitive Status: Impaired/Different from baseline Area of Impairment: Following commands, Problem solving                       Following Commands: Follows one step commands inconsistently     Problem Solving: Slow processing, Decreased initiation, Difficulty sequencing, Requires verbal cues, Requires tactile cues          Exercises      General Comments  Nursing cleared pt for participation in physical therapy.  Pt appearing agreeable to PT session; pt's wife agreeable to therapy session.      Pertinent Vitals/Pain Pain Assessment Pain Assessment: Faces Faces Pain Scale: Hurts a little bit Pain Location: abdomen Pain Descriptors / Indicators: Operative site guarding Pain Intervention(s): Limited activity within patient's tolerance, Monitored during session, Repositioned Vitals (HR and O2 on room air) stable and WFL throughout treatment session.    Home Living                          Prior Function            PT Goals (current goals can now be found in the care plan section) Acute Rehab PT Goals Patient Stated Goal: patient unable to particiapte with goal setting today PT Goal Formulation: With family Time For Goal Achievement: 01/24/22 Potential to Achieve Goals: Fair Progress towards PT goals: Progressing toward goals    Frequency    Min 2X/week      PT Plan Current plan remains appropriate    Co-evaluation              AM-PAC PT "6 Clicks" Mobility   Outcome Measure  Help needed turning from your back to your side while in a flat bed without using bedrails?: A Lot Help needed moving from lying on your back to sitting on the side of a flat bed without using bedrails?: A Lot Help needed moving to and from a bed to a chair (including a wheelchair)?: Total Help needed standing up from a chair  using your arms (e.g., wheelchair or bedside chair)?: Total Help needed to walk in hospital room?: Total Help needed climbing 3-5 steps with a railing? : Total 6 Click Score: 8    End of Session   Activity Tolerance: Patient limited by fatigue Patient left: in bed;with call bell/phone within reach;with bed alarm set;with nursing/sitter in room;with family/visitor present Nurse Communication: Precautions;Other (comment) (blood in foley catheter and on towel between pt's legs (appearing to be from pt's penis)) PT Visit Diagnosis: Unsteadiness on feet (R26.81);Muscle weakness (generalized) (M62.81)     Time: 1030-1059 PT Time Calculation (min) (ACUTE ONLY): 29 min  Charges:  $Therapeutic Activity: 23-37 mins                     Leitha Bleak, PT 01/17/22, 3:05 PM

## 2022-01-17 NOTE — Progress Notes (Signed)
Patient ID: Troy Beals., male   DOB: [birth year], 76 y.o.   MRN: [id number]     [facility] Day(s): 13.   Interval History: Patient seen and examined, no acute events or new complaints overnight.  As per nurse there was a small bowel movement last night and today.  There has been no other complaints.  Vital signs in last 24 hours: [min-max] current  Temp:  [97.8 F (36.6 C)-99.5 F (37.5 C)] 97.8 F (36.6 C) (10/13 1526) Pulse Rate:  [72-89] 86 (10/13 1526) Resp:  [14-20] 14 (10/13 1526) BP: (130-183)/(59-76) 141/70 (10/13 1526) SpO2:  [96 %-100 %] 100 % (10/13 1526)     Height: '5\' 9"'$  (175.3 cm) Weight: 65.4 kg BMI (Calculated): 21.28   Physical Exam:  Constitutional: alert, cooperative and no distress  Respiratory: breathing non-labored at rest  Cardiovascular: regular rate and sinus rhythm  Gastrointestinal: soft, non-tender, and non-distended  Labs:     Latest Ref Rng & Units 01/17/2022    6:15 AM 01/16/2022    4:03 AM 01/15/2022    5:45 AM  CBC  WBC 4.0 - 10.5 K/uL 9.1  9.8  16.1   Hemoglobin 13.0 - 17.0 g/dL 7.5  6.6  7.6   Hematocrit 39.0 - 52.0 % 23.3  21.1  24.2   Platelets 150 - 400 K/uL 197  185  176       Latest Ref Rng & Units 01/16/2022    4:03 AM 01/15/2022    5:45 AM 01/14/2022    5:16 AM  CMP  Glucose 70 - 99 mg/dL 117  92  108   BUN 8 - 23 mg/dL 70  65  66   Creatinine 0.61 - 1.24 mg/dL 4.91  4.68  4.53   Sodium 135 - 145 mmol/L 135  134  134   Potassium 3.5 - 5.1 mmol/L 3.7  4.0  3.2   Chloride 98 - 111 mmol/L 97  96  96   CO2 22 - 32 mmol/L '29  27  28   '$ Calcium 8.9 - 10.3 mg/dL 9.1  9.3  9.5   Total Protein 6.5 - 8.1 g/dL 4.8  5.2    Total Bilirubin 0.3 - 1.2 mg/dL 0.5  0.7    Alkaline Phos 38 - 126 U/L 41  41    AST 15 - 41 U/L 16  23    ALT 0 - 44 U/L 7  7      Imaging studies: No new pertinent imaging studies   Assessment/Plan:  76 y.o. male with small bowel obstruction 10 Day Post-Op s/p reduction of  internal hernia.  The was complicated by midline wound dehiscence status post wound closure postop day #7.  -Continue without any clinical sideration -He seems more alert and more comfortable -As per nurse there were 2 small bowel movement 1 last night and 1 today -We will clamp NGT and try clear liquid diet.  Arnold Long, MD

## 2022-01-17 NOTE — Consult Note (Signed)
PHARMACY - TOTAL PARENTERAL NUTRITION CONSULT NOTE   Indication: Prolonged ileus  Patient Measurements: Height: '5\' 9"'$  (175.3 cm) Weight: 65.4 kg (144 lb 2.9 oz) IBW/kg (Calculated) : 70.7 TPN AdjBW (KG): 73 Body mass index is 21.29 kg/m.   Assessment: 76 y.o. male with medical history significant of atonic bladder s/p suprapubic catheter, CKD Stage 4, anemia of chronic kidney disease, hypertension, gout found with small bowel obstruction 7 Day Post-Op s/p reduction of internal hernia.  This was complicated by midline wound dehiscence status post wound closure postop day #8.    Glucose / Insulin: currrently controlled, 2 units SSI used in the last 24 hrs Electrolytes: No BMP 10/13, Mg and Phos WNL Renal: Scr 4.91 (baseline 2.5-3, per nephrology no HD needed), BUN 70 Hepatic: LFTs WNL Intake / Output; MIVF: +1L GI Imaging:  -CT 10/11: Persistent small bowel dilatation with fluid-filled loops.  -CT 10/9:  findings favor ileus versus a partial small bowel obstruction, GI Surgeries / Procedures:  -Ex Lap 10/6: closure of opened abdominal incision wound -Ex Lap 10/3: reduction of internal hernia   Central access: DL PICC placed 10/11 TPN start date: 10/11  Nutritional Goals: Goal TPN rate is 80 mL/hr (provides 102 g of protein and 1962 kcals per day)  RD Assessment: Estimated Needs Total Energy Estimated Needs: 1800-2200kcal/day Total Protein Estimated Needs: 90-105g/day Total Fluid Estimated Needs: 1.7-2.0L/day  Current Nutrition:  NPO  Plan:  Increase to goal rate of 80 ml/hr Electrolytes in TPN: Na 30mq/L, K 56m/L, Ca 27m127mL, Mg 27mE28m, and Phos 3mmo42m. Cl:Ac 1:1 Add standard MVI and trace elements to TPN Add Thiamine '100mg'$  x3 days (day 3 of 3) Initiate Sensitive q6h SSI and adjust as needed  Monitor TPN labs on Mon/Thurs, and as needed until stable  JustiAlison Murray3/2023,9:49 AM

## 2022-01-17 NOTE — Progress Notes (Signed)
PROGRESS NOTE  Troy Berry.  DOB: [birth year]  PCP: [facility] GXQ:[id number]  DOA: 01/03/2022  LOS: 83 days  Hospital Day: 15  Brief narrative: Troy Schreiner. is a 76 y.o. male with PMH significant for atonic bladder s/p suprapubic catheter, CKD 4, HTN  9/29, patient presented to ED with complaint of suprapubic catheter dysfunction as well as abdominal distention. 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 General surgery and urology were consulted. Started on cefepime for possible UTI.   NG tube placed for abdominal distention.  Patient however did not improve with conservative management. 10/3, patient underwent ex lap and reduction of internal hernia. Patient was then noted to have acute onset of M-End and serous drainage through the wound, wound dehiscence and omental exposure.  Laparotomy wound 10/6, patient was taken to OR for wound closure.  He was noted to have distended bladder despite suprapubic catheter.  Bloody urine was aspirated.  There is no evidence of bowel obstruction 10/11, decision made to start patient on TPN with PICC line.   See below for details  Subjective: Patient was seen and examined this afternoon.  Elderly African-American male.  Propped up in bed.  Looks uncomfortable.  Keeps eyes closed.  Answers only few questions.  NG tube to suction. Foley catheter with blood-tinged urine Chart reviewed In the last 24 hours, patient has had no fever, heart rate in 80s, blood pressure as high as 183/59 this morning.  Breathing on room air  Assessment and plan: SBO s/p expiratory laparotomy and reduction of ventral hernia - 10/3 Wound dehiscence s/p repeat laparotomy  10/6 Postoperative ileus Patient had 2 laparotomies as above.  Currently postoperative ileus. 10/11,  repeat CT abdomen showed persistent bowel dilatation with fluid-filled loops but contrast material was seen in the colon suggesting no evidence of complete obstruction General surgery following.  NG tube to suction Currently on TPN through PICC line.  Hematuria due to bladder injury Patient has atonic bladder and has chronic urethral Foley catheters since 2019. 8/31, suprapubic catheter was placed by IR and was upsized on 9/25.  9/27, he was reevaluated by urology and IR without any improvement.   9/29, patient presented to ED with gross hematuria. Per urology consult note, 9/29, it appears that the Foley catheter was placed through and through the bladder posteriorly and hence the catheter tip was noted inside the bladder but outside the bladder axis to the small bowel.  Bladder was irrigated.  With consideration to his advanced age and comorbidities, conservative management with indwelling Foley catheter was tried. During the second surgery in 10/6, patient was still noted to have distended bladder with clots.  Bloody urine was aspirated. Patient continues to have bloody urine in Foley catheter.  AKI on CKD 4 Patient has a history of CKD stage IV with most recent GFR ranging between 24 and 17.  On admission, creatinine elevated at 4.85 compared to prior 3.5.  This is likely secondary to obstructive uropathy in the setting of suprapubic catheter dysfunction.  Urology has replaced suprapubic catheter with cystogram confirming tip within bladder.  Renal function continued to worsen, likely in the setting of small bowel obstruction and recurrent hypotension.  Nephrology following, patient not felt to be candidate for dialysis.  Now relatively stable with GFR around 12, continues to have good urine output. Recent  Labs    01/07/22 0538 01/08/22 0421 01/09/22 5956 01/10/22 0553 01/11/22 3875 01/12/22 6433 01/13/22 0528 01/14/22 0516 01/15/22 0545 01/16/22 0403  BUN 94* 96* 94* 88* 83* 78* 73*  66* 65* 70*  CREATININE 5.08* 5.15* 5.21* 5.09* 4.83* 4.85* 4.97* 4.53* 4.68* 4.91*  CO2 23 28 35* 37* 33* 32 '29 28 27 29   '$ Hypokalemia/hypophosphatemia Improved potassium level after replacement. Recent Labs  Lab 01/12/22 0656 01/13/22 0528 01/14/22 0516 01/15/22 0545 01/16/22 0403 01/17/22 0615  K 3.4* 3.2* 3.2* 4.0 3.7  --   MG 2.3 2.3  --  2.3 2.3 2.4  PHOS 4.9* 5.0*  --  4.7* 4.7* 3.1   Acute on chronic anemia Anemia of chronic disease due to CKD.  Baseline hemoglobin close to 9.   Hemoglobin running low this hospitalization because of hematuria.  So far received 2 units of PRBC transfusion.  Hemoglobin 7.5 this morning.  Continue to monitor Recent Labs    01/03/22 1809 01/04/22 0731 01/05/22 0521 01/13/22 0528 01/14/22 0516 01/15/22 0545 01/16/22 0403 01/17/22 0615  HGB  --  7.0*   < > 7.9* 8.8* 7.6* 6.6* 7.5*  MCV  --  79.3*   < > 84.4 83.7 86.4 84.4 83.8  VITAMINB12 684  --   --   --   --   --   --   --   FERRITIN  --  159  --   --   --   --   --   --   TIBC  --  161*  --   --   --   --   --   --   IRON  --  11*  --   --   --   --   --   --    < > = values in this interval not displayed.   Urinary tract infection Secondary to obstructive uropathy.  Completed a course of cefepime.   Essential hypertension PTA on Coreg, amlodipine, Lasix  Continue Coreg and amlodipine.   Keep Lasix on hold.   Protein-calorie malnutrition, severe Nutrition Status: Nutrition Problem: Severe Malnutrition Etiology: chronic illness (dementia, CKD, internal hernia) Signs/Symptoms: severe fat depletion, severe muscle depletion Currently on TPN    Dementia without behavioral disturbance  Stable at baseline   Goals of care   Code Status: Full Code    Mobility: Encourage ambulation once clinically improved  Skin assessment:     Nutritional status:  Body mass index is 21.29 kg/m.  Nutrition Problem: Severe Malnutrition Etiology: chronic illness (dementia, CKD, internal  hernia) Signs/Symptoms: severe fat depletion, severe muscle depletion     Diet:  Diet Order             Diet NPO time specified Except for: Sips with Meds  Diet effective now                   DVT prophylaxis:  Place and maintain sequential compression device Start: 01/08/22 1444 SCDs Start: 01/03/22 1631   Antimicrobials: None Fluid: Lasix 20 mg IV twice daily Consultants: General surgery Family Communication: None at bedside  Status is: Inpatient  Continue in-hospital care because: Continues to have hematuria, persistent bowel obstruction Level of care: Med-Surg   Dispo: The patient is from: Home              Anticipated d/c is to: Pending clinical course              Patient currently is  not medically stable to d/c.   Difficult to place patient No     Infusions:   sodium chloride     TPN ADULT (ION) 80 mL/hr at 01/16/22 1831   TPN ADULT (ION)      Scheduled Meds:  Chlorhexidine Gluconate Cloth  6 each Topical Daily   furosemide  20 mg Intravenous BID   insulin aspart  0-9 Units Subcutaneous Q6H   polyethylene glycol  17 g Oral BID   sodium chloride flush  3 mL Intravenous Q12H    PRN meds: sodium chloride, acetaminophen **OR** acetaminophen, haloperidol lactate, hydrALAZINE, iohexol, morphine injection, sodium chloride flush   Antimicrobials: Anti-infectives (From admission, onward)    Start     Dose/Rate Route Frequency Ordered Stop   01/10/22 1602  ceFAZolin (ANCEF) 2-4 GM/100ML-% IVPB       Note to Pharmacy: Trudie Reed S: cabinet override      01/10/22 1602 01/11/22 0414   01/07/22 1414  ceFAZolin (ANCEF) 2-4 GM/100ML-% IVPB       Note to Pharmacy: Olena Mater F: cabinet override      01/07/22 1414 01/07/22 1711   01/07/22 1345  ceFAZolin (ANCEF) IVPB 2g/100 mL premix       Note to Pharmacy: On call to OR   2 g 200 mL/hr over 30 Minutes Intravenous  Once 01/07/22 1256 01/07/22 1608   01/04/22 1530  ceFEPIme (MAXIPIME) 1 g in sodium  chloride 0.9 % 100 mL IVPB        1 g 200 mL/hr over 30 Minutes Intravenous Every 24 hours 01/04/22 1445 01/12/22 2359   01/03/22 1445  piperacillin-tazobactam (ZOSYN) IVPB 3.375 g        3.375 g 100 mL/hr over 30 Minutes Intravenous  Once 01/03/22 1445 01/04/22 0813       Objective: Vitals:   01/17/22 1409 01/17/22 1526  BP: (!) 146/68 (!) 141/70  Pulse: 87 86  Resp:  14  Temp: 98.1 F (36.7 C) 97.8 F (36.6 C)  SpO2:  100%    Intake/Output Summary (Last 24 hours) at 01/17/2022 1617 Last data filed at 01/17/2022 1500 Gross per 24 hour  Intake 2217.18 ml  Output 3800 ml  Net -1582.82 ml   Filed Weights   01/12/22 0500 01/13/22 0458 01/16/22 0455  Weight: 66 kg 65.9 kg 65.4 kg   Weight change:  Body mass index is 21.29 kg/m.   Physical Exam: General exam: Elderly African-American male. Skin: No rashes, lesions or ulcers. HEENT: Atraumatic, normocephalic, no obvious bleeding.  NG tube to suction Lungs: Clear to auscultation CVS: Regular rate and rhythm, no murmur GI/Abd soft, mild diffuse tenderness, bowel sounds sluggish CNS: Altered, slow to respond.  Keeps eyes closed Psychiatry: Sad affect Extremities: No pedal edema, no calf tenderness  Data Review: I have personally reviewed the laboratory data and studies available.  F/u labs ordered Unresulted Labs (From admission, onward)     Start     Ordered   01/18/22 5170  Basic metabolic panel  Tomorrow morning,   R       Question:  Specimen collection method  Answer:  Lab=Lab collect   01/17/22 1314   01/18/22 0500  Phosphorus  Tomorrow morning,   R       Question:  Specimen collection method  Answer:  Lab=Lab collect   01/17/22 1314   01/18/22 0500  Magnesium  Tomorrow morning,   R       Question:  Specimen collection method  Answer:  Lab=Lab collect   01/17/22 1314   01/16/22 0500  Comprehensive metabolic panel  (TPN Lab Panel)  Every Mon,Thu (0500),   TIMED     Question:  Specimen collection method   Answer:  Lab=Lab collect   01/14/22 1134   01/16/22 0500  Magnesium  (TPN Lab Panel)  Every Mon,Thu (0500),   TIMED     Question:  Specimen collection method  Answer:  Lab=Lab collect   01/14/22 1134   01/16/22 0500  Phosphorus  (TPN Lab Panel)  Every Mon,Thu (0500),   TIMED     Question:  Specimen collection method  Answer:  Lab=Lab collect   01/14/22 1134   01/16/22 0500  Triglycerides  (TPN Lab Panel)  Every Mon,Thu (0500),   TIMED     Question:  Specimen collection method  Answer:  Lab=Lab collect   01/14/22 1134            Signed, Terrilee Croak, MD [facility] 01/17/2022

## 2022-01-17 NOTE — Progress Notes (Signed)
[facility]  ROUNDING NOTE   Subjective:   Patient seen laying in bed, safety sitter at bedside No family at bedside NG tube still in place, moderate amount of drainage noted Suprapubic catheter with pink-tinged urine TPN infusing  UOP 2L via suprapubic catheter   Objective:  Vital signs in last 24 hours:  Temp:  [97.8 F (36.6 C)-99.5 F (37.5 C)] 97.8 F (36.6 C) (10/13 1526) Pulse Rate:  [72-89] 86 (10/13 1526) Resp:  [14-20] 14 (10/13 1526) BP: (130-183)/(59-76) 141/70 (10/13 1526) SpO2:  [96 %-100 %] 100 % (10/13 1526)  Weight change:  Filed Weights   01/12/22 0500 01/13/22 0458 01/16/22 0455  Weight: 66 kg 65.9 kg 65.4 kg    Intake/Output: I/O last 3 completed shifts: In: 3493.6 [I.V.:3137.6; Blood:356] Out: 6568 [LEXNT:7001; Emesis/NG output:1450]   Intake/Output this shift:  Total I/O In: 639.9 [I.V.:639.9] Out: 950 [Urine:950]  Physical Exam: General: NAD  Head: Normocephalic, atraumatic.   Eyes: Anicteric  Lungs:  Clear to auscultation, normal effort  Heart: Regular rate and rhythm  Abdomen:  Soft, nontender  Extremities:  No peripheral edema.  Neurologic: Nonfocal, moving all four extremities  Skin: No lesions  Access: None  GU-suprapubic catheter-hematuria  Basic Metabolic Panel: Recent Labs  Lab 01/12/22 0656 01/13/22 0528 01/14/22 0516 01/15/22 0545 01/16/22 0403 01/17/22 0615  NA 138 138 134* 134* 135  --   K 3.4* 3.2* 3.2* 4.0 3.7  --   CL 94* 97* 96* 96* 97*  --   CO2 32 $Remo'29 28 27 29  'cEoKr$ --   GLUCOSE 119* 101* 108* 92 117*  --   BUN 78* 73* 66* 65* 70*  --   CREATININE 4.85* 4.97* 4.53* 4.68* 4.91*  --   CALCIUM 9.9 9.5 9.5 9.3 9.1  --   MG 2.3 2.3  --  2.3 2.3 2.4  PHOS 4.9* 5.0*  --  4.7* 4.7* 3.1     Liver Function Tests: Recent Labs  Lab 01/15/22 0545 01/16/22 0403  AST 23 16  ALT 7 7  ALKPHOS 41 41  BILITOT 0.7 0.5  PROT 5.2* 4.8*  ALBUMIN 2.3* 2.1*    No results for input(s): "LIPASE", "AMYLASE"  in the last 168 hours. No results for input(s): "AMMONIA" in the last 168 hours.  CBC: Recent Labs  Lab 01/13/22 0528 01/14/22 0516 01/15/22 0545 01/16/22 0403 01/17/22 0615  WBC 12.2* 14.5* 16.1* 9.8 9.1  HGB 7.9* 8.8* 7.6* 6.6* 7.5*  HCT 24.8* 27.3* 24.2* 21.1* 23.3*  MCV 84.4 83.7 86.4 84.4 83.8  PLT 221 279 176 185 197     Cardiac Enzymes: No results for input(s): "CKTOTAL", "CKMB", "CKMBINDEX", "TROPONINI" in the last 168 hours.  BNP: Invalid input(s): "POCBNP"  CBG: Recent Labs  Lab 01/16/22 0507 01/16/22 1809 01/16/22 2333 01/17/22 0507 01/17/22 1131  GLUCAP 102* 117* 134* 138* 147*     Microbiology: Results for orders placed or performed during the hospital encounter of 01/03/22  Urine Culture     Status: None   Collection Time: 01/05/22  8:56 AM   Specimen: Urine, Suprapubic  Result Value Ref Range Status   Specimen Description   Final    URINE, SUPRAPUBIC Performed at [facility], [address] [place], [place] [address]    Special Requests   Final    NONE Performed at [facility], [address], [place], [place] [address]    Culture   Final    NO GROWTH Performed at [facility], [place] [place], [place] [address]    Report Status 01/06/2022 FINAL  Final    Coagulation Studies: No results for input(s): "LABPROT", "INR" in the last 72 hours.  Urinalysis: No results for input(s): "COLORURINE", "LABSPEC", "PHURINE", "GLUCOSEU", "HGBUR", "BILIRUBINUR", "KETONESUR", "PROTEINUR", "UROBILINOGEN", "NITRITE", "LEUKOCYTESUR" in the last 72 hours.  Invalid input(s): "APPERANCEUR"    Imaging: IR PICC PLACEMENT RIGHT >5 YRS INC IMG GUIDE  Result Date: 01/15/2022 INDICATION: Patient requiring TPN. Interventional radiology asked to place a PICC. EXAM: ULTRASOUND AND FLUOROSCOPIC GUIDED PICC LINE INSERTION MEDICATIONS: 1% lidocaine CONTRAST:  None FLUOROSCOPY TIME:  Radiation exposure index as provided by the  fluoroscopic device 02.6 mGy kerma COMPLICATIONS: None immediate. TECHNIQUE: The procedure, risks, benefits, and alternatives were explained to the patient and informed written consent was obtained. The right upper extremity was prepped with chlorhexidine in a sterile fashion, and a sterile drape was applied covering the operative field. Maximum barrier sterile technique with sterile gowns and gloves were used for the procedure. A timeout was performed prior to the initiation of the procedure. Local anesthesia was provided with 1% lidocaine. After the overlying soft tissues were anesthetized with 1% lidocaine, a micropuncture kit was utilized to access the right brachial vein. Real-time ultrasound guidance was utilized for vascular access including the acquisition of a permanent ultrasound image documenting patency of the accessed vessel. A guidewire was advanced to the level of the superior caval-atrial junction for measurement purposes and the PICC line was cut to length. A peel-away sheath was placed and a 36 cm, 5 [place], dual lumen was inserted to level of the superior caval-atrial junction. A post procedure spot fluoroscopic was obtained. The catheter easily aspirated and flushed and was secured in place. A dressing was placed. The patient tolerated the procedure well without immediate post procedural complication. FINDINGS: After catheter placement, the tip lies within the superior cavoatrial junction. The catheter aspirates and flushes normally and is ready for immediate use. IMPRESSION: Successful ultrasound and fluoroscopic guided placement of a right brachial vein approach, 36 cm, 5 French, dual lumen PICC with tip at the superior caval-atrial junction. The PICC line is ready for immediate use. Read by: Soyla Dryer, NP Electronically Signed   By: Aletta Edouard M.D.   On: 01/15/2022 15:52     Medications:    sodium chloride     TPN ADULT (ION) 80 mL/hr at 01/16/22 1831   TPN ADULT (ION)       Chlorhexidine Gluconate Cloth  6 each Topical Daily   furosemide  20 mg Intravenous BID   insulin aspart  0-9 Units Subcutaneous Q6H   polyethylene glycol  17 g Oral BID   sodium chloride flush  3 mL Intravenous Q12H   sodium chloride, acetaminophen **OR** acetaminophen, haloperidol lactate, hydrALAZINE, iohexol, morphine injection, sodium chloride flush  Assessment/ Plan:  [name] is a 76 y.o.  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 Patient continues to have adequate urine output.  We will check labs in a.m. to assess renal function.  No acute need for dialysis at this time.  Patient will make a poor long-term dialysis patient.  We will continue to monitor.  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    Small bowel obstruction Status post exploratory laparotomy on 01/07/2022. Experienced wound dehiscence with repair in OR on 01/10/22. TPN infusing   LOS: [place] 10/13/20233:37 PM

## 2022-01-18 DIAGNOSIS — K56609 Unspecified intestinal obstruction, unspecified as to partial versus complete obstruction: Secondary | ICD-10-CM | POA: Diagnosis not present

## 2022-01-18 LAB — BASIC METABOLIC PANEL
Anion gap: 5 (ref 5–15)
BUN: 91 mg/dL — ABNORMAL HIGH (ref 8–23)
CO2: 29 mmol/L (ref 22–32)
Calcium: 9.5 mg/dL (ref 8.9–10.3)
Chloride: 103 mmol/L (ref 98–111)
Creatinine, Ser: 5.03 mg/dL — ABNORMAL HIGH (ref 0.61–1.24)
GFR, Estimated: 11 mL/min — ABNORMAL LOW (ref >60)
Glucose, Bld: 113 mg/dL — ABNORMAL HIGH (ref 70–99)
Potassium: 4.6 mmol/L (ref 3.5–5.1)
Sodium: 137 mmol/L (ref 135–145)

## 2022-01-18 LAB — GLUCOSE, CAPILLARY
Glucose-Capillary: 105 mg/dL — ABNORMAL HIGH (ref 70–99)
Glucose-Capillary: 109 mg/dL — ABNORMAL HIGH (ref 70–99)
Glucose-Capillary: 111 mg/dL — ABNORMAL HIGH (ref 70–99)
Glucose-Capillary: 114 mg/dL — ABNORMAL HIGH (ref 70–99)
Glucose-Capillary: 116 mg/dL — ABNORMAL HIGH (ref 70–99)

## 2022-01-18 LAB — MAGNESIUM: Magnesium: 2.5 mg/dL — ABNORMAL HIGH (ref 1.7–2.4)

## 2022-01-18 LAB — PHOSPHORUS: Phosphorus: 2.2 mg/dL — ABNORMAL LOW (ref 2.5–4.6)

## 2022-01-18 MED ORDER — SODIUM PHOSPHATES 45 MMOLE/15ML IV SOLN
10.0000 mmol | Freq: Once | INTRAVENOUS | Status: AC
  Administered 2022-01-18: 10 mmol via INTRAVENOUS
  Filled 2022-01-18: qty 3.33

## 2022-01-18 MED ORDER — TRAVASOL 10 % IV SOLN
INTRAVENOUS | Status: AC
  Filled 2022-01-18: qty 1017.6

## 2022-01-18 NOTE — Consult Note (Signed)
PHARMACY - TOTAL PARENTERAL NUTRITION CONSULT NOTE   Indication: Prolonged ileus  Patient Measurements: Height: '5\' 9"'$  (175.3 cm) Weight: 66.4 kg (146 lb 6.2 oz) IBW/kg (Calculated) : 70.7 TPN AdjBW (KG): 73 Body mass index is 21.62 kg/m.   Assessment: 76 y.o. male with medical history significant of atonic bladder s/p suprapubic catheter, CKD Stage 4, anemia of chronic kidney disease, hypertension, gout found with small bowel obstruction 7 Day Post-Op s/p reduction of internal hernia.  This was complicated by midline wound dehiscence status post wound closure postop day #8.    Glucose / Insulin: currrently controlled, 2 units SSI used in the last 24 hrs Electrolytes: K 3.7>>4.6    Phos 4.7>>3.1>>2.2     Mag 2.4>>2.5   Renal: Scr 4.91>>5.03 (baseline 2.5-3, per nephrology no HD needed), BUN 70>>91 Hepatic: LFTs WNL Intake / Output; MIVF: +1L GI Imaging:  -CT 10/11: Persistent small bowel dilatation with fluid-filled loops.  -CT 10/9:  findings favor ileus versus a partial small bowel obstruction, GI Surgeries / Procedures:  -Ex Lap 10/6: closure of opened abdominal incision wound -Ex Lap 10/3: reduction of internal hernia   Central access: DL PICC placed 10/11 TPN start date: 10/11  Nutritional Goals: Goal TPN rate is 80 mL/hr (provides 102 g of protein and 1962 kcals per day)  RD Assessment: Estimated Needs Total Energy Estimated Needs: 1800-2200kcal/day Total Protein Estimated Needs: 90-105g/day Total Fluid Estimated Needs: 1.7-2.0L/day  Current Nutrition:  NPO  10/14: did not tolerate NGT clamp trial  Plan:  Continue TPN at goal rate of 80 ml/hr -CKD4  Crcl 11.6 ml/min, Not on dialysis  Electrolytes in TPN: Na 23mq/L, Will decrease K from 50 to 40 mEq/L on 10/14, Ca 570m/L, Mg 11m63mL, and Increase Phos from 3 to 5 mmol/L on 10/14. Cl:Ac 1:1 - Phos 2.2  Will order Sodium phosphate 10 mmol IV x 1 -on lasix '20mg'$  IV bid Add standard MVI and trace elements to  TPN -Thiamine '100mg'$  x3 days (completed) Initiate Sensitive q6h SSI and adjust as needed  Monitor TPN labs on Mon/Thurs, and as needed until stable  Troy Berry A 01/18/2022,12:09 PM

## 2022-01-18 NOTE — Progress Notes (Signed)
Patient ID: Troy Berry., male   DOB: 1946-03-17, 76 y.o.   MRN: [id number]     [facility] Day(s): 14.   Interval History: Patient seen and examined. Had vomiting last night and this morning. Abdomen distended again. No bowel movement today.   Vital signs in last 24 hours: [min-max] current  Temp:  [97.8 F (36.6 C)-99 F (37.2 C)] 99 F (37.2 C) (10/14 0722) Pulse Rate:  [73-90] 86 (10/14 0722) Resp:  [14-18] 16 (10/14 0722) BP: (112-174)/(57-98) 137/98 (10/14 0722) SpO2:  [97 %-100 %] 100 % (10/14 0722) Weight:  [66.4 kg] 66.4 kg (10/14 0405)     Height: '5\' 9"'$  (175.3 cm) Weight: 66.4 kg BMI (Calculated): 21.61   Physical Exam:  Constitutional: awake Respiratory: breathing non-labored at rest  Cardiovascular: regular rate and sinus rhythm  Gastrointestinal: soft, non-tender, and distended  Labs:     Latest Ref Rng & Units 01/17/2022    6:15 AM 01/16/2022    4:03 AM 01/15/2022    5:45 AM  CBC  WBC 4.0 - 10.5 K/uL 9.1  9.8  16.1   Hemoglobin 13.0 - 17.0 g/dL 7.5  6.6  7.6   Hematocrit 39.0 - 52.0 % 23.3  21.1  24.2   Platelets 150 - 400 K/uL 197  185  176       Latest Ref Rng & Units 01/18/2022    3:52 AM 01/16/2022    4:03 AM 01/15/2022    5:45 AM  CMP  Glucose 70 - 99 mg/dL 113  117  92   BUN 8 - 23 mg/dL 91  70  65   Creatinine 0.61 - 1.24 mg/dL 5.03  4.91  4.68   Sodium 135 - 145 mmol/L 137  135  134   Potassium 3.5 - 5.1 mmol/L 4.6  3.7  4.0   Chloride 98 - 111 mmol/L 103  97  96   CO2 22 - 32 mmol/L '29  29  27   '$ Calcium 8.9 - 10.3 mg/dL 9.5  9.1  9.3   Total Protein 6.5 - 8.1 g/dL  4.8  5.2   Total Bilirubin 0.3 - 1.2 mg/dL  0.5  0.7   Alkaline Phos 38 - 126 U/L  41  41   AST 15 - 41 U/L  16  23   ALT 0 - 44 U/L  7  7     Imaging studies: No new pertinent imaging studies   Assessment/Plan:  76 y.o. male with small bowel obstruction 11 Day Post-Op s/p reduction of internal hernia.  The was complicated by midline wound  dehiscence status post wound closure postop day #8.  -Did not tolerated NGT clamp trial.  -Put NGT back to suction - Continue TPN - Continue medical management as per primary team and Nephrology - I will continue to follow closely.   Arnold Long, MD

## 2022-01-18 NOTE — Progress Notes (Signed)
MD Cintron-Diaz made aware of patient's bout of emesis this morning and last night. Order to place NGT to low intermittent suction.

## 2022-01-18 NOTE — Progress Notes (Signed)
PROGRESS NOTE  Troy Berry.  DOB: April 12, 1945  PCP: [facility], [place] OJJ:[id number]  DOA: 01/03/2022  LOS: 69 days  Hospital Day: 16  Brief narrative: Troy Humble. is a 76 y.o. male with PMH significant for atonic bladder s/p suprapubic catheter, CKD 4, HTN  9/29, patient presented to ED with complaint of suprapubic catheter dysfunction as well as abdominal distention. 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 General surgery and urology were consulted. Started on cefepime for possible UTI.   NG tube placed for abdominal distention.  Patient however did not improve with conservative management. 10/3, patient underwent ex lap and reduction of internal hernia. Patient was then noted to have acute onset of M-End and serous drainage through the wound, wound dehiscence and omental exposure.  Laparotomy wound 10/6, patient was taken to OR for wound closure.  He was noted to have distended bladder despite suprapubic catheter.  Bloody urine was aspirated.  There is no evidence of bowel obstruction 10/11, decision made to start patient on TPN with PICC line.   See below for details  Subjective: Patient was seen and examined this afternoon.   Did not tolerate NG tube clamping trial.  He started vomiting.  NG tube is now back to suction.  Remains on TPN.   Family at bedside this afternoon. Foley catheter with pinkish urine.  Assessment and plan: SBO s/p expiratory laparotomy and reduction of ventral hernia - 10/3 Wound dehiscence s/p repeat laparotomy  10/6 Postoperative ileus Patient had 2 laparotomies as above.  Currently postoperative ileus. 10/11, repeat CT abdomen showed persistent bowel dilatation with fluid-filled loops but contrast material was seen in the colon suggesting no evidence of  complete obstruction General surgery following.  NG tube was clamped this morning for trial but patient did not tolerate.  Started vomiting.  NG tube back to suction.  General surgery following. Currently on TPN through PICC line.  Hematuria due to bladder injury Patient has atonic bladder and has chronic urethral Foley catheters since 2019. 8/31, suprapubic catheter was placed by IR and was upsized on 9/25.  9/27, he was reevaluated by urology and IR without any improvement.   9/29, patient presented to ED with gross hematuria. Per urology consult note, 9/29, it appears that the Foley catheter was placed through and through the bladder posteriorly and hence the catheter tip was noted inside the bladder but outside the bladder axis to the small bowel.  Bladder was irrigated.  With consideration to his advanced age and comorbidities, conservative management with indwelling Foley catheter was tried. During the second surgery in 10/6, patient was still noted to have distended bladder with clots.  Bloody urine was aspirated. Patient continues to have bloody urine in Foley catheter but is getting less red than before.  AKI on CKD 4 Patient has a history of CKD stage IV with most recent GFR ranging between 24 and 17.  On admission, creatinine elevated at 4.85 compared to prior 3.5.  This is likely secondary to obstructive uropathy in the setting of suprapubic catheter dysfunction.  Urology has replaced suprapubic catheter with cystogram confirming tip within bladder.  Renal function continued to worsen, likely in the setting of small bowel obstruction and recurrent hypotension.  Nephrology following, patient not felt to be candidate for dialysis.  Now relatively stable with GFR around 12, continues  to have good urine output. Recent Labs    01/08/22 0421 01/09/22 0626 01/10/22 0553 01/11/22 9485 01/12/22 4627 01/13/22 0528 01/14/22 0516 01/15/22 0545 01/16/22 0403 01/18/22 0352  BUN 96* 94* 88*  83* 78* 73* 66* 65* 70* 91*  CREATININE 5.15* 5.21* 5.09* 4.83* 4.85* 4.97* 4.53* 4.68* 4.91* 5.03*  CO2 28 35* 37* 33* 32 '29 28 27 29 29   '$ Hypokalemia/hypophosphatemia Phosphorus level low at 2.2 today. Recent Labs  Lab 01/13/22 0528 01/14/22 0516 01/15/22 0545 01/16/22 0403 01/17/22 0615 01/18/22 0352  K 3.2* 3.2* 4.0 3.7  --  4.6  MG 2.3  --  2.3 2.3 2.4 2.5*  PHOS 5.0*  --  4.7* 4.7* 3.1 2.2*  Acute on chronic anemia Anemia of chronic disease due to CKD.  Baseline hemoglobin close to 9.   Hemoglobin running low this hospitalization because of hematuria.  So far received 2 units of PRBC transfusion.  Hemoglobin 7.5 on last check on 10/13. Recent Labs    01/03/22 1809 01/04/22 0731 01/05/22 0521 01/13/22 0528 01/14/22 0516 01/15/22 0545 01/16/22 0403 01/17/22 0615  HGB  --  7.0*   < > 7.9* 8.8* 7.6* 6.6* 7.5*  MCV  --  79.3*   < > 84.4 83.7 86.4 84.4 83.8  VITAMINB12 684  --   --   --   --   --   --   --   FERRITIN  --  159  --   --   --   --   --   --   TIBC  --  161*  --   --   --   --   --   --   IRON  --  11*  --   --   --   --   --   --    < > = values in this interval not displayed.   Urinary tract infection Secondary to obstructive uropathy.  Completed a course of cefepime.   Essential hypertension PTA on Coreg, amlodipine, Lasix  Continue Coreg and amlodipine.   Keep Lasix on hold.   Protein-calorie malnutrition, severe Nutrition Status: Nutrition Problem: Severe Malnutrition Etiology: chronic illness (dementia, CKD, internal hernia) Signs/Symptoms: severe fat depletion, severe muscle depletion Currently on TPN    Dementia without behavioral disturbance  Stable at baseline   Goals of care   Code Status: Full Code    Mobility: Encourage ambulation once clinically improved  Skin assessment:     Nutritional status:  Body mass index is 21.62 kg/m.  Nutrition Problem: Severe Malnutrition Etiology: chronic illness (dementia, CKD, internal  hernia) Signs/Symptoms: severe fat depletion, severe muscle depletion     Diet:  Diet Order             Diet clear liquid Room service appropriate? Yes; Fluid consistency: Thin  Diet effective now                   DVT prophylaxis:  Place and maintain sequential compression device Start: 01/08/22 1444 SCDs Start: 01/03/22 1631   Antimicrobials: None Fluid:  Consultants: General surgery, nephrology Family Communication: None at bedside  Status is: Inpatient  Continue in-hospital care because: Continues to have hematuria, persistent bowel obstruction Level of care: Med-Surg   Dispo: The patient is from: Home              Anticipated d/c is to: Pending clinical course              Patient  currently is not medically stable to d/c.   Difficult to place patient No     Infusions:   sodium chloride     sodium phosphate 10 mmol in dextrose 5 % 250 mL infusion 10 mmol (01/18/22 1217)   TPN ADULT (ION) 80 mL/hr at 01/18/22 0405   TPN ADULT (ION)      Scheduled Meds:  Chlorhexidine Gluconate Cloth  6 each Topical Daily   furosemide  20 mg Intravenous BID   insulin aspart  0-9 Units Subcutaneous Q6H   polyethylene glycol  17 g Oral BID   sodium chloride flush  10-40 mL Intracatheter Q12H   sodium chloride flush  3 mL Intravenous Q12H    PRN meds: sodium chloride, acetaminophen **OR** acetaminophen, haloperidol lactate, hydrALAZINE, iohexol, morphine injection, sodium chloride flush, sodium chloride flush   Antimicrobials: Anti-infectives (From admission, onward)    Start     Dose/Rate Route Frequency Ordered Stop   01/10/22 1602  ceFAZolin (ANCEF) 2-4 GM/100ML-% IVPB       Note to Pharmacy: Trudie Reed S: cabinet override      01/10/22 1602 01/11/22 0414   01/07/22 1414  ceFAZolin (ANCEF) 2-4 GM/100ML-% IVPB       Note to Pharmacy: Olena Mater F: cabinet override      01/07/22 1414 01/07/22 1711   01/07/22 1345  ceFAZolin (ANCEF) IVPB 2g/100 mL premix        Note to Pharmacy: On call to OR   2 g 200 mL/hr over 30 Minutes Intravenous  Once 01/07/22 1256 01/07/22 1608   01/04/22 1530  ceFEPIme (MAXIPIME) 1 g in sodium chloride 0.9 % 100 mL IVPB        1 g 200 mL/hr over 30 Minutes Intravenous Every 24 hours 01/04/22 1445 01/12/22 2359   01/03/22 1445  piperacillin-tazobactam (ZOSYN) IVPB 3.375 g        3.375 g 100 mL/hr over 30 Minutes Intravenous  Once 01/03/22 1445 01/04/22 0813       Objective: Vitals:   01/18/22 0631 01/18/22 0722  BP: (!) 129/57 (!) 137/98  Pulse: 73 86  Resp: 18 16  Temp: 98.1 F (36.7 C) 99 F (37.2 C)  SpO2: 100% 100%    Intake/Output Summary (Last 24 hours) at 01/18/2022 1342 Last data filed at 01/18/2022 1110 Gross per 24 hour  Intake 1371.56 ml  Output 2700 ml  Net -1328.44 ml   Filed Weights   01/13/22 0458 01/16/22 0455 01/18/22 0405  Weight: 65.9 kg 65.4 kg 66.4 kg   Weight change:  Body mass index is 21.62 kg/m.   Physical Exam: General exam: Elderly African-American male. Skin: No rashes, lesions or ulcers. HEENT: Atraumatic, normocephalic, no obvious bleeding.  NG tube to suction Lungs: Clear to auscultation bilaterally CVS: Regular rate and rhythm, no murmur GI/Abd soft, mild diffuse tenderness, bowel sounds sluggish CNS: Opens eyes and verbal command today.  Slow to respond.  Able to follow command.  Psychiatry: Sad affect Extremities: No pedal edema, no calf tenderness  Data Review: I have personally reviewed the laboratory data and studies available.  F/u labs ordered Unresulted Labs (From admission, onward)     Start     Ordered   01/19/22 7673  Basic metabolic panel  Tomorrow morning,   R       Question:  Specimen collection method  Answer:  Lab=Lab collect   01/18/22 1225   01/19/22 0500  Phosphorus  Tomorrow morning,   R  Question:  Specimen collection method  Answer:  Lab=Lab collect   01/18/22 1225   01/19/22 0500  Magnesium  Tomorrow morning,   R        Question:  Specimen collection method  Answer:  Lab=Lab collect   01/18/22 1225   01/19/22 0500  CBC with Differential/Platelet  Tomorrow morning,   R       Question:  Specimen collection method  Answer:  Lab=Lab collect   01/18/22 1338   01/16/22 0500  Comprehensive metabolic panel  (TPN Lab Panel)  Every Mon,Thu (0500),   TIMED     Question:  Specimen collection method  Answer:  Lab=Lab collect   01/14/22 1134   01/16/22 0500  Magnesium  (TPN Lab Panel)  Every Mon,Thu (0500),   TIMED     Question:  Specimen collection method  Answer:  Lab=Lab collect   01/14/22 1134   01/16/22 0500  Phosphorus  (TPN Lab Panel)  Every Mon,Thu (0500),   TIMED     Question:  Specimen collection method  Answer:  Lab=Lab collect   01/14/22 1134   01/16/22 0500  Triglycerides  (TPN Lab Panel)  Every Mon,Thu (0500),   TIMED     Question:  Specimen collection method  Answer:  Lab=Lab collect   01/14/22 1134            Signed, Terrilee Croak, MD [facility] 01/18/2022

## 2022-01-18 NOTE — Progress Notes (Signed)
[facility]  ROUNDING NOTE   Subjective:   Patient seen laying in bed, safety sitter at bedside No family at bedside NG tube still in place, moderate amount of drainage noted Suprapubic catheter  TPN infusing    Objective:  Vital signs in last 24 hours:  Temp:  [97.8 F (36.6 C)-99.5 F (37.5 C)] 98.1 F (36.7 C) (10/14 0631) Pulse Rate:  [73-90] 73 (10/14 0631) Resp:  [14-20] 18 (10/14 0631) BP: (112-183)/(57-70) 129/57 (10/14 0631) SpO2:  [97 %-100 %] 100 % (10/14 0631) Weight:  [66.4 kg] 66.4 kg (10/14 0405)  Weight change:  Filed Weights   01/13/22 0458 01/16/22 0455 01/18/22 0405  Weight: 65.9 kg 65.4 kg 66.4 kg    Intake/Output: I/O last 3 completed shifts: In: 3318.3 [I.V.:2962.3; Blood:356] Out: 4300 [Urine:3100; Emesis/NG output:1200]   Intake/Output this shift:  Total I/O In: 731.7 [I.V.:731.7] Out: 1300 [Urine:1300]  Physical Exam: General: NAD  Head: Normocephalic, atraumatic.   Eyes: Anicteric  Lungs:  Clear to auscultation, normal effort  Heart: Regular rate and rhythm  Abdomen:  Soft, nontender  Extremities:  No peripheral edema.  Neurologic: Nonfocal, moving all four extremities  Skin: No lesions  Access: None  GU-suprapubic catheter-hematuria  Basic Metabolic Panel: Recent Labs  Lab 01/13/22 0528 01/14/22 0516 01/15/22 0545 01/16/22 0403 01/17/22 0615 01/18/22 0352  NA 138 134* 134* 135  --  137  K 3.2* 3.2* 4.0 3.7  --  4.6  CL 97* 96* 96* 97*  --  103  CO2 '29 28 27 29  '$ --  29  GLUCOSE 101* 108* 92 117*  --  113*  BUN 73* 66* 65* 70*  --  91*  CREATININE 4.97* 4.53* 4.68* 4.91*  --  5.03*  CALCIUM 9.5 9.5 9.3 9.1  --  9.5  MG 2.3  --  2.3 2.3 2.4 2.5*  PHOS 5.0*  --  4.7* 4.7* 3.1 2.2*    Liver Function Tests: Recent Labs  Lab 01/15/22 0545 01/16/22 0403  AST 23 16  ALT 7 7  ALKPHOS 41 41  BILITOT 0.7 0.5  PROT 5.2* 4.8*  ALBUMIN 2.3* 2.1*   No results for input(s): "LIPASE", "AMYLASE" in the last  168 hours. No results for input(s): "AMMONIA" in the last 168 hours.  CBC: Recent Labs  Lab 01/13/22 0528 01/14/22 0516 01/15/22 0545 01/16/22 0403 01/17/22 0615  WBC 12.2* 14.5* 16.1* 9.8 9.1  HGB 7.9* 8.8* 7.6* 6.6* 7.5*  HCT 24.8* 27.3* 24.2* 21.1* 23.3*  MCV 84.4 83.7 86.4 84.4 83.8  PLT 221 279 176 185 197    Cardiac Enzymes: No results for input(s): "CKTOTAL", "CKMB", "CKMBINDEX", "TROPONINI" in the last 168 hours.  BNP: Invalid input(s): "POCBNP"  CBG: Recent Labs  Lab 01/17/22 0507 01/17/22 1131 01/17/22 1734 01/17/22 2240 01/18/22 0540  GLUCAP 138* 147* 122* 115* 105*    Microbiology: Results for orders placed or performed during the hospital encounter of 01/03/22  Urine Culture     Status: None   Collection Time: 01/05/22  8:56 AM   Specimen: Urine, Suprapubic  Result Value Ref Range Status   Specimen Description   Final    URINE, SUPRAPUBIC Performed at [facility], [address], [place], [place] [address]    Special Requests   Final    NONE Performed at [facility], [address] [place], [place] [address]    Culture   Final    NO GROWTH Performed at [facility], [address] [place], [place] [id number]    Report Status 01/06/2022 FINAL  Final    Coagulation Studies: No results for input(s): "LABPROT", "INR" in the last 72 hours.  Urinalysis: No results for input(s): "COLORURINE", "LABSPEC", "PHURINE", "GLUCOSEU", "HGBUR", "BILIRUBINUR", "KETONESUR", "PROTEINUR", "UROBILINOGEN", "NITRITE", "LEUKOCYTESUR" in the last 72 hours.  Invalid input(s): "APPERANCEUR"    Imaging: No results found.   Medications:    sodium chloride     TPN ADULT (ION) 80 mL/hr at 01/18/22 0405    Chlorhexidine Gluconate Cloth  6 each Topical Daily   furosemide  20 mg Intravenous BID   insulin aspart  0-9 Units Subcutaneous Q6H   polyethylene glycol  17 g Oral BID   sodium chloride flush  10-40 mL Intracatheter Q12H    sodium chloride flush  3 mL Intravenous Q12H   sodium chloride, acetaminophen **OR** acetaminophen, haloperidol lactate, hydrALAZINE, iohexol, morphine injection, sodium chloride flush, sodium chloride flush  Assessment/ Plan:  [name] is a 76 y.o.  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 Patient continues to have adequate urine output.  We will continue to follow patient's labs Patient's creatinine is stable at 5.0.  Patient has had a creatinine of 5.0 going back to September of this year.  No acute need for dialysis at this time.  Patient will make a poor long-term dialysis patient.  We will continue to monitor.  Lab Results  Component Value Date   CREATININE 5.03 (H) 01/18/2022   CREATININE 4.91 (H) 01/16/2022   CREATININE 4.68 (H) 01/15/2022    Intake/Output Summary (Last 24 hours) at 01/18/2022 0657 Last data filed at 01/18/2022 0547 Gross per 24 hour  Intake 1626.81 ml  Output 2250 ml  Net -623.19 ml   Hypernatremia resolved . 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    Small bowel obstruction Status post exploratory laparotomy on 01/07/2022. Experienced wound dehiscence with repair in OR on 01/10/22. TPN infusing   LOS: 14 Troy Berry 10/14/20236:57 AM

## 2022-01-19 DIAGNOSIS — R310 Gross hematuria: Secondary | ICD-10-CM | POA: Diagnosis not present

## 2022-01-19 LAB — BASIC METABOLIC PANEL
Anion gap: 7 (ref 5–15)
BUN: 99 mg/dL — ABNORMAL HIGH (ref 8–23)
CO2: 29 mmol/L (ref 22–32)
Calcium: 9.9 mg/dL (ref 8.9–10.3)
Chloride: 103 mmol/L (ref 98–111)
Creatinine, Ser: 5.07 mg/dL — ABNORMAL HIGH (ref 0.61–1.24)
GFR, Estimated: 11 mL/min — ABNORMAL LOW (ref >60)
Glucose, Bld: 115 mg/dL — ABNORMAL HIGH (ref 70–99)
Potassium: 4.7 mmol/L (ref 3.5–5.1)
Sodium: 139 mmol/L (ref 135–145)

## 2022-01-19 LAB — CBC WITH DIFFERENTIAL/PLATELET
Abs Immature Granulocytes: 0.06 10*3/uL (ref 0.00–0.07)
Basophils Absolute: 0 10*3/uL (ref 0.0–0.1)
Basophils Relative: 0 %
Eosinophils Absolute: 0.2 10*3/uL (ref 0.0–0.5)
Eosinophils Relative: 3 %
HCT: 22 % — ABNORMAL LOW (ref 39.0–52.0)
Hemoglobin: 7 g/dL — ABNORMAL LOW (ref 13.0–17.0)
Immature Granulocytes: 1 %
Lymphocytes Relative: 10 %
Lymphs Abs: 0.8 10*3/uL (ref 0.7–4.0)
MCH: 26.8 pg (ref 26.0–34.0)
MCHC: 31.8 g/dL (ref 30.0–36.0)
MCV: 84.3 fL (ref 80.0–100.0)
Monocytes Absolute: 1.3 10*3/uL — ABNORMAL HIGH (ref 0.1–1.0)
Monocytes Relative: 16 %
Neutro Abs: 5.7 10*3/uL (ref 1.7–7.7)
Neutrophils Relative %: 70 %
Platelets: 182 10*3/uL (ref 150–400)
RBC: 2.61 MIL/uL — ABNORMAL LOW (ref 4.22–5.81)
RDW: 15.6 % — ABNORMAL HIGH (ref 11.5–15.5)
WBC: 8.1 10*3/uL (ref 4.0–10.5)
nRBC: 0 % (ref 0.0–0.2)

## 2022-01-19 LAB — PHOSPHORUS: Phosphorus: 2.7 mg/dL (ref 2.5–4.6)

## 2022-01-19 LAB — GLUCOSE, CAPILLARY
Glucose-Capillary: 109 mg/dL — ABNORMAL HIGH (ref 70–99)
Glucose-Capillary: 110 mg/dL — ABNORMAL HIGH (ref 70–99)
Glucose-Capillary: 117 mg/dL — ABNORMAL HIGH (ref 70–99)
Glucose-Capillary: 119 mg/dL — ABNORMAL HIGH (ref 70–99)

## 2022-01-19 LAB — MAGNESIUM: Magnesium: 2.6 mg/dL — ABNORMAL HIGH (ref 1.7–2.4)

## 2022-01-19 MED ORDER — TRAVASOL 10 % IV SOLN
INTRAVENOUS | Status: AC
  Filled 2022-01-19: qty 1017.6

## 2022-01-19 NOTE — Consult Note (Signed)
PHARMACY - TOTAL PARENTERAL NUTRITION CONSULT NOTE   Indication: Prolonged ileus  Patient Measurements: Height: '5\' 9"'$  (175.3 cm) Weight: 66.4 kg (146 lb 6.2 oz) IBW/kg (Calculated) : 70.7 TPN AdjBW (KG): 73 Body mass index is 21.62 kg/m.   Assessment: 76 y.o. male with medical history significant of atonic bladder s/p suprapubic catheter, CKD Stage 4, anemia of chronic kidney disease, hypertension, gout found with small bowel obstruction 7 Day Post-Op s/p reduction of internal hernia.  This was complicated by midline wound dehiscence status post wound closure postop day #8.    Glucose / Insulin: currrently controlled, 0 units SSI used in the last 24 hrs Electrolytes: K 4.7   Phos 2.7   Mag 2.6  Renal: Scr 4.91>>5.03 >>5.07(baseline 2.5-3, per nephrology no HD needed), BUN 70>>91 Hepatic: LFTs WNL Intake / Output; MIVF: +1L GI Imaging:  -CT 10/11: Persistent small bowel dilatation with fluid-filled loops.  -CT 10/9:  findings favor ileus versus a partial small bowel obstruction, GI Surgeries / Procedures:  -Ex Lap 10/6: closure of opened abdominal incision wound -Ex Lap 10/3: reduction of internal hernia   Central access: DL PICC placed 10/11 TPN start date: 10/11  Nutritional Goals: Goal TPN rate is 80 mL/hr (provides 102 g of protein and 1962 kcals per day)  RD Assessment: Estimated Needs Total Energy Estimated Needs: 1800-2200kcal/day Total Protein Estimated Needs: 90-105g/day Total Fluid Estimated Needs: 1.7-2.0L/day  Current Nutrition:  NPO  10/14: did not tolerate NGT clamp trial  Plan:  Continue TPN at goal rate of 80 ml/hr -CKD4  Crcl 11.5 ml/min, Not on dialysis. Monitor for need to concentrate TPN/volume status  Electrolytes in TPN: Na 61mq/L, Will decrease K from 50 to 40 mEq/L on 10/14, Ca 562m/L, Will decrease Mg from 5 to 3 mEq/L on 10/15 and Increase Phos from 0 to 5 mmol/L on 10/14. Cl:Ac 1:1 -on lasix '20mg'$  IV bid Add standard MVI and trace elements  to TPN, remove chromium d/t renal insuffciency 10/15 -Thiamine '100mg'$  x3 days (completed) Initiate Sensitive q6h SSI and adjust as needed  Monitor TPN labs on Mon/Thurs, and as needed until stable  Duilio Heritage A 01/19/2022,12:01 PM

## 2022-01-19 NOTE — Progress Notes (Signed)
Patient ID: Troy Beals., male   DOB: Feb 06, 1946, 76 y.o.   MRN: [id number]     [facility] Day(s): 15.   Interval History: Patient seen and examined, no acute events or new complaints overnight. Reported a good bowel movement. Nurse tech present endorses that the bowel movement was medium to large.  Vital signs in last 24 hours: [min-max] current  Temp:  [98.2 F (36.8 C)-98.3 F (36.8 C)] 98.3 F (36.8 C) (10/15 0751) Pulse Rate:  [66-84] 84 (10/15 0751) Resp:  [16-20] 16 (10/15 0751) BP: (147-156)/(63-72) 153/71 (10/15 0751) SpO2:  [98 %-100 %] 100 % (10/15 0751)     Height: '5\' 9"'$  (175.3 cm) Weight: 66.4 kg BMI (Calculated): 21.61   Physical Exam:  Constitutional: alert, cooperative and no distress  Respiratory: breathing non-labored at rest  Cardiovascular: regular rate and sinus rhythm  Gastrointestinal: soft, non-tender, and non-distended  Labs:     Latest Ref Rng & Units 01/19/2022    5:28 AM 01/17/2022    6:15 AM 01/16/2022    4:03 AM  CBC  WBC 4.0 - 10.5 K/uL 8.1  9.1  9.8   Hemoglobin 13.0 - 17.0 g/dL 7.0  7.5  6.6   Hematocrit 39.0 - 52.0 % 22.0  23.3  21.1   Platelets 150 - 400 K/uL 182  197  185       Latest Ref Rng & Units 01/19/2022    5:28 AM 01/18/2022    3:52 AM 01/16/2022    4:03 AM  CMP  Glucose 70 - 99 mg/dL 115  113  117   BUN 8 - 23 mg/dL 99  91  70   Creatinine 0.61 - 1.24 mg/dL 5.07  5.03  4.91   Sodium 135 - 145 mmol/L 139  137  135   Potassium 3.5 - 5.1 mmol/L 4.7  4.6  3.7   Chloride 98 - 111 mmol/L 103  103  97   CO2 22 - 32 mmol/L '29  29  29   '$ Calcium 8.9 - 10.3 mg/dL 9.9  9.5  9.1   Total Protein 6.5 - 8.1 g/dL   4.8   Total Bilirubin 0.3 - 1.2 mg/dL   0.5   Alkaline Phos 38 - 126 U/L   41   AST 15 - 41 U/L   16   ALT 0 - 44 U/L   7     Imaging studies: No new pertinent imaging studies   Assessment/Plan:  76 y.o. male with small bowel obstruction 12 Day Post-Op s/p reduction of internal hernia.  The  was complicated by midline wound dehiscence status post wound closure postop day #9.   -Had a good bowel movement.  -abdomen again is soft -Will try again NGT clamping -Continue TPN  Arnold Long, MD

## 2022-01-19 NOTE — Progress Notes (Signed)
PROGRESS NOTE  Troy Berry.  DOB: [birth year]  PCP: [facility] EHU:[id number]  DOA: 01/03/2022  LOS: 15 days  Hospital Day: 17  Brief narrative: Troy Blyden. is a 76 y.o. male with PMH significant for atonic bladder s/p suprapubic catheter, CKD 4, HTN  9/29, patient presented to ED with complaint of suprapubic catheter dysfunction as well as abdominal distention. 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 General surgery and urology were consulted. Started on cefepime for possible UTI.   NG tube placed for abdominal distention.  Patient however did not improve with conservative management. 10/3, patient underwent ex lap and reduction of internal hernia. Patient was then noted to have acute onset of M-End and serous drainage through the wound, wound dehiscence and omental exposure.  Laparotomy wound 10/6, patient was taken to OR for wound closure.  He was noted to have distended bladder despite suprapubic catheter.  Bloody urine was aspirated.  There is no evidence of bowel obstruction 10/11, decision made to start patient on TPN with PICC line.   See below for details  Subjective: Patient was seen and examined this morning. Somnolent with effective pain medicines.  Verbalize some words.  Family at bedside.   NG tube clamped this morning.   Foley catheter continues to show pinkish urine.  Assessment and plan: SBO s/p expiratory laparotomy and reduction of ventral hernia - 10/3 Wound dehiscence s/p repeat laparotomy  10/6 Postoperative ileus Patient had 2 laparotomies as above.  Currently postoperative ileus. 10/11, repeat CT abdomen showed persistent bowel dilatation with fluid-filled loops but contrast material was seen in the colon suggesting no evidence of complete  obstruction. General surgery following.   10/14, clamping trial failed 10/15, had a good bowel movement.  Abdomen soft.  NG tube was clamped by general surgery this morning.  Continue to monitor. Currently on TPN through PICC line oral intake is ensured.  Hematuria due to bladder injury Patient has atonic bladder and has chronic urethral Foley catheters since 2019. 8/31, suprapubic catheter was placed by IR and was upsized on 9/25.  9/27, he was reevaluated by urology and IR without any improvement.   9/29, patient presented to ED with gross hematuria. Per urology consult note, 9/29, it appears that the Foley catheter was placed through and through the bladder posteriorly and hence the catheter tip was noted inside the bladder but outside the bladder axis to the small bowel.  Bladder was irrigated.  With consideration to his advanced age and comorbidities, conservative management with indwelling Foley catheter was tried. During the second surgery in 10/6, patient was still noted to have distended bladder with clots.  Bloody urine was aspirated. Patient continues to have bloody urine in Foley catheter but is getting less red than before.  AKI on CKD 4 Patient has a history of CKD stage IV with most recent GFR ranging between 24 and 17.  On admission, creatinine elevated at 4.85 compared to prior 3.5.  This is likely secondary to obstructive uropathy in the setting of suprapubic catheter dysfunction.  Urology has replaced suprapubic catheter with cystogram confirming tip within bladder.  Renal function continued to worsen, likely in the setting of small bowel obstruction and recurrent hypotension.  Nephrology following, patient not felt to be candidate for dialysis.  Now relatively stable with GFR around 12, continues to have  good urine output. Defer to nephrology on the dosing of Lasix. Recent Labs    01/09/22 0605 01/10/22 0553 01/11/22 0628 01/12/22 0656 01/13/22 0528 01/14/22 0516  01/15/22 0545 01/16/22 0403 01/18/22 0352 01/19/22 0528  BUN 94* 88* 83* 78* 73* 66* 65* 70* 91* 99*  CREATININE 5.21* 5.09* 4.83* 4.85* 4.97* 4.53* 4.68* 4.91* 5.03* 5.07*  CO2 35* 37* 33* 32 '29 28 27 29 29 29   '$ Hypokalemia/hypophosphatemia Potassium level normal for last several days.   Phosphorus level was low at 2.2 on 10/14.  Improved with replacement. Recent Labs  Lab 01/14/22 0516 01/15/22 0545 01/16/22 0403 01/17/22 0615 01/18/22 0352 01/19/22 0528  K 3.2* 4.0 3.7  --  4.6 4.7  MG  --  2.3 2.3 2.4 2.5* 2.6*  PHOS  --  4.7* 4.7* 3.1 2.2* 2.7  Acute on chronic anemia Anemia of chronic disease due to CKD.  Baseline hemoglobin close to 9.   Hemoglobin running low this hospitalization because of hematuria.  So far received 2 units of PRBC transfusion.  Hemoglobin low at 7 this morning.  Suspected to be lower tomorrow.  I will order for type and screen and repeat CBC tomorrow morning. Recent Labs    01/03/22 1809 01/04/22 0731 01/05/22 0521 01/14/22 0516 01/15/22 0545 01/16/22 0403 01/17/22 0615 01/19/22 0528  HGB  --  7.0*   < > 8.8* 7.6* 6.6* 7.5* 7.0*  MCV  --  79.3*   < > 83.7 86.4 84.4 83.8 84.3  VITAMINB12 684  --   --   --   --   --   --   --   FERRITIN  --  159  --   --   --   --   --   --   TIBC  --  161*  --   --   --   --   --   --   IRON  --  11*  --   --   --   --   --   --    < > = values in this interval not displayed.   Urinary tract infection Secondary to obstructive uropathy.  Completed a course of cefepime.   Essential hypertension Continue Coreg and amlodipine.  Continue to monitor blood pressure   Protein-calorie malnutrition, severe Nutrition Status: Nutrition Problem: Severe Malnutrition Etiology: chronic illness (dementia, CKD, internal hernia) Signs/Symptoms: severe fat depletion, severe muscle depletion Currently on TPN    Dementia without behavioral disturbance  Stable at baseline   Goals of care   Code Status: Full Code     Mobility: Encourage ambulation once clinically improved  Skin assessment:     Nutritional status:  Body mass index is 21.62 kg/m.  Nutrition Problem: Severe Malnutrition Etiology: chronic illness (dementia, CKD, internal hernia) Signs/Symptoms: severe fat depletion, severe muscle depletion     Diet:  Diet Order             Diet clear liquid Room service appropriate? Yes; Fluid consistency: Thin  Diet effective now                   DVT prophylaxis:  Place and maintain sequential compression device Start: 01/08/22 1444 SCDs Start: 01/03/22 1631   Antimicrobials: None Fluid:  Consultants: General surgery, nephrology Family Communication: Family at bedside  Status is: Inpatient  Continue in-hospital care because: Continues to have hematuria, improving bowel extraction Level of care: Med-Surg   Dispo: The patient is from: Home  Anticipated d/c is to: Pending clinical course              Patient currently is not medically stable to d/c.   Difficult to place patient No     Infusions:   sodium chloride     TPN ADULT (ION) 80 mL/hr at 01/19/22 0224    Scheduled Meds:  Chlorhexidine Gluconate Cloth  6 each Topical Daily   furosemide  20 mg Intravenous BID   insulin aspart  0-9 Units Subcutaneous Q6H   polyethylene glycol  17 g Oral BID   sodium chloride flush  10-40 mL Intracatheter Q12H   sodium chloride flush  3 mL Intravenous Q12H    PRN meds: sodium chloride, acetaminophen **OR** acetaminophen, haloperidol lactate, hydrALAZINE, iohexol, morphine injection, sodium chloride flush, sodium chloride flush   Antimicrobials: Anti-infectives (From admission, onward)    Start     Dose/Rate Route Frequency Ordered Stop   01/10/22 1602  ceFAZolin (ANCEF) 2-4 GM/100ML-% IVPB       Note to Pharmacy: Trudie Reed S: cabinet override      01/10/22 1602 01/11/22 0414   01/07/22 1414  ceFAZolin (ANCEF) 2-4 GM/100ML-% IVPB       Note to Pharmacy:  Olena Mater F: cabinet override      01/07/22 1414 01/07/22 1711   01/07/22 1345  ceFAZolin (ANCEF) IVPB 2g/100 mL premix       Note to Pharmacy: On call to OR   2 g 200 mL/hr over 30 Minutes Intravenous  Once 01/07/22 1256 01/07/22 1608   01/04/22 1530  ceFEPIme (MAXIPIME) 1 g in sodium chloride 0.9 % 100 mL IVPB        1 g 200 mL/hr over 30 Minutes Intravenous Every 24 hours 01/04/22 1445 01/12/22 2359   01/03/22 1445  piperacillin-tazobactam (ZOSYN) IVPB 3.375 g        3.375 g 100 mL/hr over 30 Minutes Intravenous  Once 01/03/22 1445 01/04/22 0813       Objective: Vitals:   01/19/22 0446 01/19/22 0751  BP: (!) 156/63 (!) 153/71  Pulse: 66 84  Resp: 20 16  Temp: 98.3 F (36.8 C) 98.3 F (36.8 C)  SpO2: 100% 100%    Intake/Output Summary (Last 24 hours) at 01/19/2022 1215 Last data filed at 01/19/2022 0500 Gross per 24 hour  Intake 1792.93 ml  Output 2350 ml  Net -557.07 ml   Filed Weights   01/13/22 0458 01/16/22 0455 01/18/22 0405  Weight: 65.9 kg 65.4 kg 66.4 kg   Weight change:  Body mass index is 21.62 kg/m.   Physical Exam: General exam: Elderly African-American male.  Foley catheter with blood-mixed urine Skin: No rashes, lesions or ulcers. HEENT: Atraumatic, normocephalic, no obvious bleeding.  NG tube clamped this morning. Lungs: Clear to auscultation bilaterally CVS: Regular rate and rhythm, no murmur GI/Abd soft, mild diffuse tenderness, bowel sounds sluggish CNS: Somnolent, opens eyes on verbal command.  Slow to respond.  Able to follow command.  Psychiatry: Sad affect Extremities: No pedal edema, no calf tenderness  Data Review: I have personally reviewed the laboratory data and studies available.  F/u labs ordered Unresulted Labs (From admission, onward)     Start     Ordered   01/20/22 0500  CBC with Differential/Platelet  Tomorrow morning,   R       Question:  Specimen collection method  Answer:  Lab=Lab collect   01/19/22 1213    01/20/22 0500  Type and screen [place]  Once,   R       Comments: [place]    01/19/22 1213   01/16/22 0500  Comprehensive metabolic panel  (TPN Lab Panel)  Every Mon,Thu (0500),   TIMED     Question:  Specimen collection method  Answer:  Lab=Lab collect   01/14/22 1134   01/16/22 0500  Magnesium  (TPN Lab Panel)  Every Mon,Thu (0500),   TIMED     Question:  Specimen collection method  Answer:  Lab=Lab collect   01/14/22 1134   01/16/22 0500  Phosphorus  (TPN Lab Panel)  Every Mon,Thu (0500),   TIMED     Question:  Specimen collection method  Answer:  Lab=Lab collect   01/14/22 1134   01/16/22 0500  Triglycerides  (TPN Lab Panel)  Every Mon,Thu (0500),   TIMED     Question:  Specimen collection method  Answer:  Lab=Lab collect   01/14/22 1134            Signed, Terrilee Croak, MD [facility] 01/19/2022

## 2022-01-19 NOTE — TOC Progression Note (Signed)
Transition of Care (TOC) - Progression Note    Patient Details  Name: Troy Berry. MRN: [id number] Date of Birth: [birth year]  Transition of Care [facility]) CM/SW Contact  Beverly Sessions, RN Phone Number: 01/19/2022, 3:14 PM  Clinical Narrative:    Patient still with TPN.   Message sent to [facility] to determine if TPN is not weaned could they offer a bed   Expected Discharge Plan:  (TBD) Barriers to Discharge: Continued Medical Work up  Expected Discharge Plan and Services Expected Discharge Plan:  (TBD)     Post Acute Care Choice:  (TBD) Living arrangements for the past 2 months: Single Family Home                                       Social Determinants of Health (SDOH) Interventions Transportation Interventions: Inpatient TOC, Other (Comment) (Transportation provided through [employer])  Readmission Risk Interventions     No data to display

## 2022-01-19 NOTE — Progress Notes (Signed)
[facility]  ROUNDING NOTE   Subjective:   Patient seen laying in bed, safety sitter at bedside No family at bedside NG tube still in place, moderate amount of drainage noted Suprapubic catheter  TPN infusing    Objective:  Vital signs in last 24 hours:  Temp:  [98.2 F (36.8 C)-98.3 F (36.8 C)] 98.3 F (36.8 C) (10/15 0751) Pulse Rate:  [66-84] 84 (10/15 0751) Resp:  [16-20] 16 (10/15 0751) BP: (147-156)/(63-72) 153/71 (10/15 0751) SpO2:  [98 %-100 %] 100 % (10/15 0751)  Weight change:  Filed Weights   01/13/22 0458 01/16/22 0455 01/18/22 0405  Weight: 65.9 kg 65.4 kg 66.4 kg    Intake/Output: I/O last 3 completed shifts: In: 2524.6 [I.V.:2271; IV Piggyback:253.6] Out: 4100 [Urine:3500; Emesis/NG output:600]   Intake/Output this shift:  No intake/output data recorded.  Physical Exam: General: NAD  Head: Normocephalic, atraumatic.   Eyes: Anicteric  Lungs:  Clear to auscultation, normal effort  Heart: Regular rate and rhythm  Abdomen:  Soft, nontender  Extremities:  No peripheral edema.  Neurologic: Nonfocal, moving all four extremities  Skin: No lesions  Access: None  GU-suprapubic catheter-hematuria  Basic Metabolic Panel: Recent Labs  Lab 01/14/22 0516 01/15/22 0545 01/16/22 0403 01/17/22 0615 01/18/22 0352 01/19/22 0528  NA 134* 134* 135  --  137 139  K 3.2* 4.0 3.7  --  4.6 4.7  CL 96* 96* 97*  --  103 103  CO2 '28 27 29  '$ --  29 29  GLUCOSE 108* 92 117*  --  113* 115*  BUN 66* 65* 70*  --  91* 99*  CREATININE 4.53* 4.68* 4.91*  --  5.03* 5.07*  CALCIUM 9.5 9.3 9.1  --  9.5 9.9  MG  --  2.3 2.3 2.4 2.5* 2.6*  PHOS  --  4.7* 4.7* 3.1 2.2* 2.7    Liver Function Tests: Recent Labs  Lab 01/15/22 0545 01/16/22 0403  AST 23 16  ALT 7 7  ALKPHOS 41 41  BILITOT 0.7 0.5  PROT 5.2* 4.8*  ALBUMIN 2.3* 2.1*   No results for input(s): "LIPASE", "AMYLASE" in the last 168 hours. No results for input(s): "AMMONIA" in the last 168  hours.  CBC: Recent Labs  Lab 01/14/22 0516 01/15/22 0545 01/16/22 0403 01/17/22 0615 01/19/22 0528  WBC 14.5* 16.1* 9.8 9.1 8.1  NEUTROABS  --   --   --   --  5.7  HGB 8.8* 7.6* 6.6* 7.5* 7.0*  HCT 27.3* 24.2* 21.1* 23.3* 22.0*  MCV 83.7 86.4 84.4 83.8 84.3  PLT 279 176 185 197 182    Cardiac Enzymes: No results for input(s): "CKTOTAL", "CKMB", "CKMBINDEX", "TROPONINI" in the last 168 hours.  BNP: Invalid input(s): "POCBNP"  CBG: Recent Labs  Lab 01/18/22 1143 01/18/22 1630 01/18/22 2050 01/18/22 2347 01/19/22 0610  GLUCAP 116* 109* 114* 111* 109*    Microbiology: Results for orders placed or performed during the hospital encounter of 01/03/22  Urine Culture     Status: None   Collection Time: 01/05/22  8:56 AM   Specimen: Urine, Suprapubic  Result Value Ref Range Status   Specimen Description   Final    URINE, SUPRAPUBIC Performed at [facility], [address], [place], [place] [address]    Special Requests   Final    NONE Performed at [facility], [address], [place], [place] [address]    Culture   Final    NO GROWTH Performed at [facility]  [facility], [place] [address] [place], [place] [address]    Report Status 01/06/2022 FINAL  Final    Coagulation Studies: No results for input(s): "LABPROT", "INR" in the last 72 hours.  Urinalysis: No results for input(s): "COLORURINE", "LABSPEC", "PHURINE", "GLUCOSEU", "HGBUR", "BILIRUBINUR", "KETONESUR", "PROTEINUR", "UROBILINOGEN", "NITRITE", "LEUKOCYTESUR" in the last 72 hours.  Invalid input(s): "APPERANCEUR"    Imaging: No results found.   Medications:    sodium chloride     TPN ADULT (ION) 80 mL/hr at 01/19/22 0224    Chlorhexidine Gluconate Cloth  6 each Topical Daily   furosemide  20 mg Intravenous BID   insulin aspart  0-9 Units Subcutaneous Q6H   polyethylene glycol  17 g Oral BID   sodium chloride flush  10-40 mL Intracatheter Q12H   sodium chloride flush  3 mL  Intravenous Q12H   sodium chloride, acetaminophen **OR** acetaminophen, haloperidol lactate, hydrALAZINE, iohexol, morphine injection, sodium chloride flush, sodium chloride flush  Assessment/ Plan:  [name] is a 76 y.o.  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 Patient continues to have adequate urine output.  We will continue to follow patient's labs Patient's creatinine is stable at 5.0.  Patient has had a creatinine of 5.0 going back to September of this year.  No acute need for dialysis at this time.  Patient will make a poor long-term dialysis patient.  We will continue to monitor.  Lab Results  Component Value Date   CREATININE 5.07 (H) 01/19/2022   CREATININE 5.03 (H) 01/18/2022   CREATININE 4.91 (H) 01/16/2022    Intake/Output Summary (Last 24 hours) at 01/19/2022 0914 Last data filed at 01/19/2022 0500 Gross per 24 hour  Intake 1792.93 ml  Output 2800 ml  Net -1007.07 ml   Hypernatremia resolved . 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    Small bowel obstruction Status post exploratory laparotomy on 01/07/2022. Experienced wound dehiscence with repair in OR on 01/10/22. TPN infusing   LOS: 15 Abou Sterkel s Ashlley Booher 10/15/20239:14 AM

## 2022-01-20 ENCOUNTER — Inpatient Hospital Stay: Payer: Medicare HMO

## 2022-01-20 DIAGNOSIS — K560 Paralytic ileus: Secondary | ICD-10-CM

## 2022-01-20 DIAGNOSIS — Z789 Other specified health status: Secondary | ICD-10-CM

## 2022-01-20 DIAGNOSIS — Z515 Encounter for palliative care: Secondary | ICD-10-CM

## 2022-01-20 DIAGNOSIS — N179 Acute kidney failure, unspecified: Secondary | ICD-10-CM | POA: Diagnosis not present

## 2022-01-20 DIAGNOSIS — N184 Chronic kidney disease, stage 4 (severe): Secondary | ICD-10-CM | POA: Diagnosis not present

## 2022-01-20 DIAGNOSIS — D631 Anemia in chronic kidney disease: Secondary | ICD-10-CM

## 2022-01-20 DIAGNOSIS — D62 Acute posthemorrhagic anemia: Secondary | ICD-10-CM | POA: Diagnosis not present

## 2022-01-20 LAB — COMPREHENSIVE METABOLIC PANEL
ALT: 6 U/L (ref 0–44)
AST: 15 U/L (ref 15–41)
Albumin: 2.2 g/dL — ABNORMAL LOW (ref 3.5–5.0)
Alkaline Phosphatase: 63 U/L (ref 38–126)
Anion gap: 6 (ref 5–15)
BUN: 100 mg/dL — ABNORMAL HIGH (ref 8–23)
CO2: 28 mmol/L (ref 22–32)
Calcium: 9.7 mg/dL (ref 8.9–10.3)
Chloride: 105 mmol/L (ref 98–111)
Creatinine, Ser: 4.85 mg/dL — ABNORMAL HIGH (ref 0.61–1.24)
GFR, Estimated: 12 mL/min — ABNORMAL LOW (ref >60)
Glucose, Bld: 116 mg/dL — ABNORMAL HIGH (ref 70–99)
Potassium: 5 mmol/L (ref 3.5–5.1)
Sodium: 139 mmol/L (ref 135–145)
Total Bilirubin: 0.5 mg/dL (ref 0.3–1.2)
Total Protein: 5.2 g/dL — ABNORMAL LOW (ref 6.5–8.1)

## 2022-01-20 LAB — CBC WITH DIFFERENTIAL/PLATELET
Abs Immature Granulocytes: 0.04 10*3/uL (ref 0.00–0.07)
Basophils Absolute: 0 10*3/uL (ref 0.0–0.1)
Basophils Relative: 1 %
Eosinophils Absolute: 0.2 10*3/uL (ref 0.0–0.5)
Eosinophils Relative: 3 %
HCT: 19.9 % — ABNORMAL LOW (ref 39.0–52.0)
Hemoglobin: 6.3 g/dL — ABNORMAL LOW (ref 13.0–17.0)
Immature Granulocytes: 1 %
Lymphocytes Relative: 10 %
Lymphs Abs: 0.6 10*3/uL — ABNORMAL LOW (ref 0.7–4.0)
MCH: 27 pg (ref 26.0–34.0)
MCHC: 31.7 g/dL (ref 30.0–36.0)
MCV: 85.4 fL (ref 80.0–100.0)
Monocytes Absolute: 0.9 10*3/uL (ref 0.1–1.0)
Monocytes Relative: 14 %
Neutro Abs: 4.8 10*3/uL (ref 1.7–7.7)
Neutrophils Relative %: 71 %
Platelets: 175 10*3/uL (ref 150–400)
RBC: 2.33 MIL/uL — ABNORMAL LOW (ref 4.22–5.81)
RDW: 15.8 % — ABNORMAL HIGH (ref 11.5–15.5)
WBC: 6.6 10*3/uL (ref 4.0–10.5)
nRBC: 0 % (ref 0.0–0.2)

## 2022-01-20 LAB — GLUCOSE, CAPILLARY
Glucose-Capillary: 109 mg/dL — ABNORMAL HIGH (ref 70–99)
Glucose-Capillary: >600 mg/dL (ref 70–99)
Glucose-Capillary: 110 mg/dL — ABNORMAL HIGH (ref 70–99)
Glucose-Capillary: 83 mg/dL (ref 70–99)
Glucose-Capillary: 118 mg/dL — ABNORMAL HIGH (ref 70–99)

## 2022-01-20 LAB — PHOSPHORUS: Phosphorus: 2.7 mg/dL (ref 2.5–4.6)

## 2022-01-20 LAB — TRIGLYCERIDES: Triglycerides: 71 mg/dL (ref <150)

## 2022-01-20 LAB — PREPARE RBC (CROSSMATCH)

## 2022-01-20 LAB — MAGNESIUM: Magnesium: 2.4 mg/dL (ref 1.7–2.4)

## 2022-01-20 MED ORDER — PNEUMOCOCCAL 20-VAL CONJ VACC 0.5 ML IM SUSY
0.5000 mL | PREFILLED_SYRINGE | INTRAMUSCULAR | Status: AC
  Administered 2022-01-23: 0.5 mL via INTRAMUSCULAR
  Filled 2022-01-20 (×2): qty 0.5

## 2022-01-20 MED ORDER — SODIUM CHLORIDE 0.9% IV SOLUTION: Freq: Once | INTRAVENOUS | Status: AC

## 2022-01-20 MED ORDER — TRAVASOL 10 % IV SOLN
INTRAVENOUS | Status: AC
  Filled 2022-01-20: qty 1017.6

## 2022-01-20 MED ORDER — INFLUENZA VAC A&B SA ADJ QUAD 0.5 ML IM PRSY
0.5000 mL | PREFILLED_SYRINGE | INTRAMUSCULAR | Status: AC
  Administered 2022-01-23: 0.5 mL via INTRAMUSCULAR
  Filled 2022-01-20 (×2): qty 0.5

## 2022-01-20 NOTE — Progress Notes (Signed)
OT Cancellation Note  Patient Details Name: Troy Berry. MRN: [id number] DOB: August 03, 1945   Cancelled Treatment:    Reason Eval/Treat Not Completed: Patient not medically ready. Chart reviewed. Hgb 6.3 this morning which is contraindicated for activity. Messaged RN and pt planning to receive unit of blood today. Will re-attempt as medically appropriate.   Doneta Public 01/20/2022, 11:24 AM

## 2022-01-20 NOTE — Progress Notes (Signed)
Patient ID: Troy Berry., male   DOB: [birth year], 76 y.o.   MRN: [id number]     [facility] Day(s): 16.   Interval History: Patient seen and examined.  Chart reports multiple bowel movement yesterday.  This morning patient again with abdominal distention.  Vital signs in last 24 hours: [min-max] current  Temp:  [97.9 F (36.6 C)-99.5 F (37.5 C)] 97.9 F (36.6 C) (10/16 0542) Pulse Rate:  [70-100] 81 (10/16 0542) Resp:  [16-20] 20 (10/16 0542) BP: (123-150)/(55-70) 150/70 (10/16 0542) SpO2:  [99 %-100 %] 100 % (10/16 0542)     Height: '5\' 9"'$  (175.3 cm) Weight: 66.4 kg BMI (Calculated): 21.61   Physical Exam:  Constitutional: alert, cooperative and no distress  Respiratory: breathing non-labored at rest  Cardiovascular: regular rate and sinus rhythm  Gastrointestinal: soft, non-tender, and distended  Labs:     Latest Ref Rng & Units 01/19/2022    5:28 AM 01/17/2022    6:15 AM 01/16/2022    4:03 AM  CBC  WBC 4.0 - 10.5 K/uL 8.1  9.1  9.8   Hemoglobin 13.0 - 17.0 g/dL 7.0  7.5  6.6   Hematocrit 39.0 - 52.0 % 22.0  23.3  21.1   Platelets 150 - 400 K/uL 182  197  185       Latest Ref Rng & Units 01/19/2022    5:28 AM 01/18/2022    3:52 AM 01/16/2022    4:03 AM  CMP  Glucose 70 - 99 mg/dL 115  113  117   BUN 8 - 23 mg/dL 99  91  70   Creatinine 0.61 - 1.24 mg/dL 5.07  5.03  4.91   Sodium 135 - 145 mmol/L 139  137  135   Potassium 3.5 - 5.1 mmol/L 4.7  4.6  3.7   Chloride 98 - 111 mmol/L 103  103  97   CO2 22 - 32 mmol/L '29  29  29   '$ Calcium 8.9 - 10.3 mg/dL 9.9  9.5  9.1   Total Protein 6.5 - 8.1 g/dL   4.8   Total Bilirubin 0.3 - 1.2 mg/dL   0.5   Alkaline Phos 38 - 126 U/L   41   AST 15 - 41 U/L   16   ALT 0 - 44 U/L   7     Imaging studies: No new pertinent imaging studies   Assessment/Plan:  76 y.o. male with small bowel obstruction 13 Day Post-Op s/p reduction of internal hernia.  The was complicated by midline wound dehiscence  status post wound closure postop day #10.   Prolonged postoperative ileus -Seems to be resolving yesterday but today with abdominal distention. -We will keep NGT clamped and follow clinically -Follow-up abdominal x-ray -Continue TPN -Maintain electrolytes within normal limits -I will continue to follow closely.  Arnold Long, MD

## 2022-01-20 NOTE — TOC Progression Note (Signed)
Transition of Care (TOC) - Progression Note    Patient Details  Name: Troy Berry. MRN: [id number] Date of Birth: 20-Oct-1945  Transition of Care [facility]) CM/SW Contact  Beverly Sessions, RN Phone Number: 01/20/2022, 3:52 PM  Clinical Narrative:    Patient currently with mitts, sitter and TPN Confirmed with Ebony Hail at [facility] that if all of the above are discontinued they can consider offering as this is on of wifes preferred facilities    Expected Discharge Plan:  (TBD) Barriers to Discharge: Continued Medical Work up  Expected Discharge Plan and Services Expected Discharge Plan:  (TBD)     Post Acute Care Choice:  (TBD) Living arrangements for the past 2 months: Single Family Home                                       Social Determinants of Health (SDOH) Interventions Transportation Interventions: Inpatient TOC, Other (Comment) (Transportation provided through [employer])  Readmission Risk Interventions     No data to display

## 2022-01-20 NOTE — Progress Notes (Signed)
[facility]  ROUNDING NOTE   Subjective:   Patient seen laying in bed, safety sitter at bedside No family at bedside NG tube still in place, moderate amount of drainage noted Suprapubic catheter  TPN infusing    Objective:  Vital signs in last 24 hours:  Temp:  [97.6 F (36.4 C)-99.5 F (37.5 C)] 98 F (36.7 C) (10/16 1634) Pulse Rate:  [63-100] 63 (10/16 1634) Resp:  [12-20] 12 (10/16 1634) BP: (105-154)/(52-72) 134/60 (10/16 1634) SpO2:  [98 %-100 %] 100 % (10/16 1634)  Weight change:  Filed Weights   01/13/22 0458 01/16/22 0455 01/18/22 0405  Weight: 65.9 kg 65.4 kg 66.4 kg    Intake/Output: I/O last 3 completed shifts: In: 2238.7 [I.V.:1825.4; Blood:413.3] Out: 1625 [Urine:1625]   Intake/Output this shift:  No intake/output data recorded.  Physical Exam: General: NAD  Head: Normocephalic, atraumatic.   Eyes: Anicteric  Lungs:  Clear to auscultation, normal effort  Heart: Regular rate and rhythm  Abdomen:  Soft, nontender  Extremities:  No peripheral edema.  Neurologic: Nonfocal, moving all four extremities  Skin: No lesions  Access: None  GU-suprapubic catheter-hematuria  Basic Metabolic Panel: Recent Labs  Lab 01/15/22 0545 01/16/22 0403 01/17/22 0615 01/18/22 0352 01/19/22 0528 01/20/22 0800  NA 134* 135  --  137 139 139  K 4.0 3.7  --  4.6 4.7 5.0  CL 96* 97*  --  103 103 105  CO2 27 29  --  '29 29 28  '$ GLUCOSE 92 117*  --  113* 115* 116*  BUN 65* 70*  --  91* 99* 100*  CREATININE 4.68* 4.91*  --  5.03* 5.07* 4.85*  CALCIUM 9.3 9.1  --  9.5 9.9 9.7  MG 2.3 2.3 2.4 2.5* 2.6* 2.4  PHOS 4.7* 4.7* 3.1 2.2* 2.7 2.7    Liver Function Tests: Recent Labs  Lab 01/15/22 0545 01/16/22 0403 01/20/22 0800  AST '23 16 15  '$ ALT '7 7 6  '$ ALKPHOS 41 41 63  BILITOT 0.7 0.5 0.5  PROT 5.2* 4.8* 5.2*  ALBUMIN 2.3* 2.1* 2.2*   No results for input(s): "LIPASE", "AMYLASE" in the last 168 hours. No results for input(s): "AMMONIA" in the  last 168 hours.  CBC: Recent Labs  Lab 01/15/22 0545 01/16/22 0403 01/17/22 0615 01/19/22 0528 01/20/22 0800  WBC 16.1* 9.8 9.1 8.1 6.6  NEUTROABS  --   --   --  5.7 4.8  HGB 7.6* 6.6* 7.5* 7.0* 6.3*  HCT 24.2* 21.1* 23.3* 22.0* 19.9*  MCV 86.4 84.4 83.8 84.3 85.4  PLT 176 185 197 182 175    Cardiac Enzymes: No results for input(s): "CKTOTAL", "CKMB", "CKMBINDEX", "TROPONINI" in the last 168 hours.  BNP: Invalid input(s): "POCBNP"  CBG: Recent Labs  Lab 01/19/22 2353 01/20/22 0541 01/20/22 0544 01/20/22 1254 01/20/22 1717  GLUCAP 117* >600* 109* 110* 83    Microbiology: Results for orders placed or performed during the hospital encounter of 01/03/22  Urine Culture     Status: None   Collection Time: 01/05/22  8:56 AM   Specimen: Urine, Suprapubic  Result Value Ref Range Status   Specimen Description   Final    URINE, SUPRAPUBIC Performed at [facility], [address], [place], [place] [address]    Special Requests   Final    NONE Performed at [facility], [address] [place], [place] [address]    Culture   Final    NO GROWTH Performed at [facility], [place] [address] [place], [place] [address]    Report Status 01/06/2022 FINAL  Final    Coagulation Studies: No results for input(s): "LABPROT", "INR" in the last 72 hours.  Urinalysis: No results for input(s): "COLORURINE", "LABSPEC", "PHURINE", "GLUCOSEU", "HGBUR", "BILIRUBINUR", "KETONESUR", "PROTEINUR", "UROBILINOGEN", "NITRITE", "LEUKOCYTESUR" in the last 72 hours.  Invalid input(s): "APPERANCEUR"    Imaging: DG Abd 2 Views  Result Date: 01/20/2022 CLINICAL DATA:  [id number] Ileus following gastrointestinal surgery ([place]) [id number] EXAM: ABDOMEN - 2 VIEW COMPARISON:  Radiograph 01/15/2022 FINDINGS: Nasogastric tube tip and side port overlie the stomach. There is persistent diffuse gaseous distension of bowel in the abdomen, not significantly changed from prior. Suprapubic  catheter overlies the pelvis. Midline skin staples. IMPRESSION: Persistent diffuse gaseous distention of bowel, not significantly changed from prior, compatible with ileus. Electronically Signed   By: Maurine Simmering M.D.   On: 01/20/2022 08:44     Medications:    sodium chloride     TPN ADULT (ION) 80 mL/hr at 01/20/22 1802    Chlorhexidine Gluconate Cloth  6 each Topical Daily   furosemide  20 mg Intravenous BID   [START ON 01/21/2022] influenza vaccine adjuvanted  0.5 mL Intramuscular Tomorrow-1000   insulin aspart  0-9 Units Subcutaneous Q6H   [START ON 01/21/2022] pneumococcal 20-valent conjugate vaccine  0.5 mL Intramuscular Tomorrow-1000   polyethylene glycol  17 g Oral BID   sodium chloride flush  10-40 mL Intracatheter Q12H   sodium chloride flush  3 mL Intravenous Q12H   sodium chloride, acetaminophen **OR** acetaminophen, haloperidol lactate, hydrALAZINE, iohexol, morphine injection, sodium chloride flush, sodium chloride flush  Assessment/ Plan:  [name] is a 76 y.o.  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 Patient continues to have adequate urine output.  We will continue to follow patient's labs Patient's creatinine is stable at 5.0.  Patient has had a creatinine of 5.0 going back to September of this year.  No acute need for dialysis at this time.  Patient will make a poor long-term dialysis patient.  We will continue to monitor.  Lab Results  Component Value Date   CREATININE 4.85 (H) 01/20/2022   CREATININE 5.07 (H) 01/19/2022   CREATININE 5.03 (H) 01/18/2022    Intake/Output Summary (Last 24 hours) at 01/20/2022 1921 Last data filed at 01/20/2022 1802 Gross  per 24 hour  Intake 2211.97 ml  Output 1625 ml  Net 586.97 ml   Hypernatremia resolved . 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    Small bowel obstruction Status post exploratory laparotomy on 01/07/2022. Experienced wound dehiscence with repair in OR on 01/10/22. TPN infusing   LOS: 16 Shadd Dunstan s Kathleen Tamm 10/16/20237:21 PM

## 2022-01-20 NOTE — Consult Note (Signed)
Consultation Note Date: 01/20/2022   [name].  DOB: February 03, 1946  MRN: [id number]  Age / Sex: 76 y.o., male  PCP: [facility] [place] Referring Physician: Antonieta Pert, MD  Reason for Consultation: Establishing goals of care  HPI/Patient Profile: 76 y.o. male  with past medical history of atonic bladder status post suprapubic catheter, CKD (stage IV), and secondary hypertension admitted on 01/03/2022 with complaints of supra pubic catheter dysfunction and abdominal distention.  Hospital course includes ex lap and reduction of internal hernia as well as additional ex lap for wound closure.  Patient has received PRBCs due to low hemoglobin, Foley catheter remains, and TPN has been initiated via PICC line.  Patient is being followed by general surgery and nephrology.  PMT was consulted to discuss goals of care.  Clinical Assessment and Goals of Care: I have reviewed medical records including EPIC notes, labs and imaging, assessed the patient and then spoke with his wife Kennyth Lose over the phone to discuss diagnosis prognosis, [place], EOL wishes, disposition and options.  I introduced Palliative Medicine as specialized medical care for people living with serious illness. It focuses on providing relief from the symptoms and stress of a serious illness. The goal is to improve quality of life for both the patient and the family.  Kennyth Lose and I discussed course of patient's hospitalization over the past 17 days. She shares he has been through a lot and is ready to go home, but knows that he needs to get well before he can leave the hospital. Discussed patient's functional, nutritional, and cognitive status.   We discussed patient's current illness and what it means in the larger context of patient's on-going co-morbidities. I attempted to elicit values and goals of care important to the patient.   Wife shares she is hopeful that the patient will regain some strength. She also shares she hopes his voice and speech will improve once the NG tube comes out. She knows that he is working with PT/OT and wants to ensure that he is working towards getting stronger. She is grateful for the care that he has gotten but is concerned with how he will recover from "all of this".  Family is facing treatment option decisions, advanced directive, and anticipatory care needs. Wife and I briefly discussed code status and overall goals of care for the patient. Discussed with Kennyth Lose the importance of continued conversation with family and the medical providers regarding overall plan of care and treatment options, ensuring decisions are within the context of the patient's values and GOCs. She spent the majority of the day at the hospital and would like to defer further discussions of patient's health until tomorrow, Tuesday, 10/17. We agreed to speak over the phone to discuss [place] tomorrow after 12pm.    Questions and concerns were addressed. The family was encouraged to call with questions or concerns. I plan on speaking with Kennyth Lose via telephone tomorrow at 12pm.   Primary Decision Maker NEXT OF KIN  Code Status/Advance Care Planning:  Full code  Prognosis:   Unable to determine  Discharge Planning: To Be Determined  Primary Diagnoses: Present on Admission:  Acute renal failure superimposed on stage 4 chronic kidney disease (HCC)  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HCC)  (Resolved) Hyponatremia  Metabolic acidosis  Anemia  Hematuria  Essential hypertension  SBO (small bowel obstruction) (HCC)  Dementia without behavioral disturbance (HCC)  Hypokalemia  Gross hematuria  Acute blood loss anemia  Urinary tract infection  Protein-calorie malnutrition, severe   Physical Exam Vitals reviewed.  Constitutional:      Appearance: Normal appearance. He is normal weight.  HENT:     Head: Normocephalic.      Nose:     Comments: NGT in place    Mouth/Throat:     Mouth: Mucous membranes are moist.  Eyes:     Pupils: Pupils are equal, round, and reactive to light.  Cardiovascular:     Rate and Rhythm: Normal rate.     Pulses: Normal pulses.  Pulmonary:     Effort: Pulmonary effort is normal.  Abdominal:     General: There is no distension.     Palpations: Abdomen is soft.     Tenderness: There is no abdominal tenderness. There is no guarding.  Musculoskeletal:        General: Normal range of motion.     Comments: Generalized weakness  Skin:    General: Skin is warm and dry.  Neurological:     Mental Status: He is alert.  Psychiatric:        Mood and Affect: Mood normal.        Behavior: Behavior normal.        Judgment: Judgment normal.     Comments: Speech is weak, but patient AAO to self, place, and situation     Palliative Assessment/Data: 40%     Thank you for this consult. Palliative medicine will continue to follow and assist holistically.   Time Total: 45 minutes Greater than 50%  of this time was spent counseling and coordinating care related to the above assessment and plan.  Signed by: Jordan Hawks, DNP, FNP-BC Palliative Medicine    Please contact Palliative Medicine Team phone at [phone] for questions and concerns.  For individual provider: See Shea Evans

## 2022-01-20 NOTE — Progress Notes (Signed)
PT Cancellation Note  Patient Details Name: Troy Berry. MRN: [id number] DOB: [birth year]   Cancelled Treatment:    Reason Eval/Treat Not Completed: Patient not medically ready.  Chart reviewed.  Pt's Hgb noted to be down trending to 6.3 this morning (currently receiving blood transfusion).  Will re-attempt PT session at a later date/time as medically appropriate.  Leitha Bleak, PT 01/20/22, 11:21 AM

## 2022-01-20 NOTE — Progress Notes (Signed)
PROGRESS NOTE Troy Berry.  ZOX:[id number] DOB: 04/26/1945 DOA: 01/03/2022 PCP: [facility]   Brief Narrative/Hospital Course: 76 year old male past medical history have atonic bladder status post suprapubic catheter plus stage IV chronic kidney disease and secondary hypertension who presented to the emergency room on 9/30 with complaints of suprapubic catheter dysfunction as well as abdominal distention.  Patient found to have possible SBO versus ileus.   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 General surgery and urology were consulted. Started on cefepime for possible UTI.   NG tube placed for abdominal distention.  Patient however did not improve with conservative management. 10/3, patient underwent ex lap and reduction of internal hernia. Patient was then noted to have acute onset of M-End and serous drainage through the wound, wound dehiscence and omental exposure.  Laparotomy wound 10/6, patient was taken to OR for wound closure.  He was noted to have distended bladder despite suprapubic catheter.  Bloody urine was aspirated.  There is no evidence of bowel obstruction 10/11, decision made to start patient on TPN with PICC line. Course also complicated by wound dehiscence and exposed omentum at laparotomy site Continues to have blood in Foley catheter, continues to need TPN followed by general surgery, nephrology.    Subjective: Seen and examined alert awake Not in distress.  Remains on TPN Suprapubic catheter in place, right upper extremity PICC line Abdomen stable bowel movements NGT clamped  Assessment and Plan: Principal Problem:   Acute renal failure superimposed on stage 4 chronic kidney disease ([place]) Active Problems:   SBO (small bowel obstruction) (HCC)   Suprapubic catheter  dysfunction (HCC)   Anemia   Hematuria   Urinary tract infection   Metabolic acidosis   Essential hypertension   Gross hematuria   Dementia without behavioral disturbance (HCC)   Hypokalemia   Acute blood loss anemia   Protein-calorie malnutrition, severe   Wound dehiscence   SBO s/p expiratory laparotomy and reduction of ventral hernia - 10/3 Wound dehiscence s/p repeat laparotomy  10/6 Postoperative ileus Complicated hospitalization for SBO needing two laparotomies and now complicated with postop ileus.  Needing TPN.  Surgery following closely appreciate input.  Pharmacy managing TPN w/ RUE PICC.  Had BMs, NG being clamped, follow-up with abdominal x-ray, close monitoring of electrolytes, clinical status.  Hematuria due to bladder injury: He has atonic bladder and has chronic urethral Foley catheters since 2019. 8/31, suprapubic catheter was placed by IR and was upsized on 9/25.  9/27, he was reevaluated by urology and IR without any improvement.   9/29, patient presented to ED with gross hematuria. Per urology consult note, 9/29, it appears that the Foley catheter was placed through and through the bladder posteriorly and hence the catheter tip was noted inside the bladder but outside the bladder axis to the small bowel.  Bladder was irrigated.  With consideration to his advanced age and comorbidities, conservative management with indwelling Foley catheter was tried. During the second surgery in 10/6, patient was still noted to have distended bladder with clots.  Bloody urine was aspirated.  Foley continues to appear pinkish, draining.  AKI on CKD stage IV: Creatinine 4.8 on admission compared to 3.5 previously suspect obstructive uropathy in the setting of suprapubic catheter dysfunction Urology has replaced suprapubic catheter with cystogram confirming tip within bladder.  Renal function continued to worsen,  likely in the setting of small bowel obstruction and recurrent hypotension.  Seen by nephrology following, patient not felt to be candidate for dialysis.  Renal function relatively stable, on IV Lasix as per nephrology.  Monitor as below Recent Labs  Lab 01/15/22 0545 01/16/22 0403 01/18/22 0352 01/19/22 0528 01/20/22 0800  BUN 65* 70* 91* 99* 100*  CREATININE 4.68* 4.91* 5.03* 5.07* 4.85*    Hypokalemia/hypophosphatemia: Labs stable monitor as below Recent Labs  Lab 01/15/22 0545 01/16/22 0403 01/17/22 0615 01/18/22 0352 01/19/22 0528 01/20/22 0800  K 4.0 3.7  --  4.6 4.7 5.0  CALCIUM 9.3 9.1  --  9.5 9.9 9.7  MG 2.3 2.3 2.4 2.5* 2.6* 2.4  PHOS 4.7* 4.7* 3.1 2.2* 2.7 2.7   Acute on chronic anemia: suspect due to CKD.  Baseline close to 9 g, so far needed to units PRBC, again hemoglobin 6.3 as below-suspect from hematuria and anemia of chronic disease and renal disease.  Will order 1 unit prbc Recent Labs  Lab 01/15/22 0545 01/16/22 0403 01/17/22 0615 01/19/22 0528 01/20/22 0800  HGB 7.6* 6.6* 7.5* 7.0* 6.3*  HCT 24.2* 21.1* 23.3* 22.0* 19.9*   UTI due to obstructive uropathy:Completed a course of cefepime. Essential Hypertension: BP stable cont coreg and amlodipine  Goals of care: currently full code, palliative care has been consulted, patient has multiple comorbidities with prolonged hospitalization, overall prognosis does not appear bright.  Protein-calorie malnutrition, severe Nutrition Problem: Severe Malnutrition Etiology: chronic illness (dementia, CKD, internal hernia) Signs/Symptoms: severe fat depletion, severe muscle depletion     DVT prophylaxis: Place and maintain sequential compression device Start: 01/08/22 1444 SCDs Start: 01/03/22 1631 Code Status:   Code Status: Full Code Family Communication: plan of care discussed with patient's RN at bedside. No answer when called wife's phone no.  Patient status VQ:[id number] because of ongoing post operative ileus. Level of care: Med-Surg.  Dispo:The patient is from:Home.            Anticipated disposition:Pending.  Mobility Assessment (last 72 hours)     Mobility Assessment     Row Name 01/20/22 0800 01/19/22 2229 01/18/22 2138 01/17/22 1452     Does patient have an order for bedrest or is patient medically unstable No - Continue assessment No - Continue assessment No - Continue assessment --    What is the highest level of mobility based on the progressive mobility assessment? Level 2 (Chairfast) - Balance while sitting on edge of bed and cannot stand Level 2 (Chairfast) - Balance while sitting on edge of bed and cannot stand Level 2 (Chairfast) - Balance while sitting on edge of bed and cannot stand Level 2 (Chairfast) - Balance while sitting on edge of bed and cannot stand    Is the above level different from baseline mobility prior to current illness? Yes - Recommend PT order Yes - Recommend PT order Yes - Recommend PT order --            Objective: Vitals last 24 hrs: Vitals:   01/20/22 0542 01/20/22 0823 01/20/22 1033 01/20/22 1050  BP: (!) 150/70 112/72 (!) 120/52 (!) 105/58  Pulse: 81 66 70 77  Resp: '20 17 16 16  '$ Temp: 97.9 F (36.6 C) 98.2 F (36.8 C) 98.8 F (37.1 C) 97.6 F (36.4 C)  TempSrc: Oral Oral Oral Oral  SpO2: 100% 100% 100% 98%  Weight:      Height:       Weight change:   Physical Examination: General exam:  alert awake, follows some commands difficult to understand HEENT:Oral mucosa moist, Ear/Nose WNL grossly Respiratory system: bilaterally clear BS, no use of accessory muscle Cardiovascular system: S1 & S2 +, No JVD. Gastrointestinal system: Abdomen soft,NT,ND, BS+, staples intact, suprapubic catheter in place. Nervous System:Alert, awake, moving upper and lower extremities. Extremities: LE edema mild,distal peripheral pulses palpable.  Skin: No rashes,no icterus. MSK: Normal muscle bulk,tone, power  Medications reviewed:  Scheduled Meds:  Chlorhexidine Gluconate Cloth  6 each Topical Daily   furosemide  20 mg  Intravenous BID   [START ON 01/21/2022] influenza vaccine adjuvanted  0.5 mL Intramuscular Tomorrow-1000   insulin aspart  0-9 Units Subcutaneous Q6H   [START ON 01/21/2022] pneumococcal 20-valent conjugate vaccine  0.5 mL Intramuscular Tomorrow-1000   polyethylene glycol  17 g Oral BID   sodium chloride flush  10-40 mL Intracatheter Q12H   sodium chloride flush  3 mL Intravenous Q12H   Continuous Infusions:  sodium chloride     TPN ADULT (ION) 80 mL/hr at 01/20/22 0225   TPN ADULT (ION)        Diet Order             Diet clear liquid Room service appropriate? Yes; Fluid consistency: Thin  Diet effective now                    Nutrition Problem: Severe Malnutrition Etiology: chronic illness (dementia, CKD, internal hernia) Signs/Symptoms: severe fat depletion, severe muscle depletion     Intake/Output Summary (Last 24 hours) at 01/20/2022 1128 Last data filed at 01/20/2022 0639 Gross per 24 hour  Intake 651.61 ml  Output 1150 ml  Net -498.39 ml   Net IO Since Admission: -1,298.19 mL [01/20/22 1128]  Wt Readings from Last 3 Encounters:  01/18/22 66.4 kg  12/05/21 73 kg  09/28/21 72.6 kg     Unresulted Labs (From admission, onward)     Start     Ordered   01/20/22 0500  CBC with Differential/Platelet  Tomorrow morning,   R       Question:  Specimen collection method  Answer:  Lab=Lab collect   01/19/22 1213   01/16/22 0500  Comprehensive metabolic panel  (TPN Lab Panel)  Every Mon,Thu (0500),   TIMED     Question:  Specimen collection method  Answer:  Lab=Lab collect   01/14/22 1134   01/16/22 0500  Magnesium  (TPN Lab Panel)  Every Mon,Thu (0500),   TIMED     Question:  Specimen collection method  Answer:  Lab=Lab collect   01/14/22 1134   01/16/22 0500  Phosphorus  (TPN Lab Panel)  Every Mon,Thu (0500),   TIMED     Question:  Specimen collection method  Answer:  Lab=Lab collect   01/14/22 1134   01/16/22 0500  Triglycerides  (TPN Lab Panel)  Every Mon,Thu  (0500),   TIMED     Question:  Specimen collection method  Answer:  Lab=Lab collect   01/14/22 1134          Data Reviewed: I have personally reviewed following labs and imaging studies CBC: Recent Labs  Lab 01/15/22 0545 01/16/22 0403 01/17/22 0615 01/19/22 0528 01/20/22 0800  WBC 16.1* 9.8 9.1 8.1 6.6  NEUTROABS  --   --   --  5.7 4.8  HGB 7.6* 6.6* 7.5* 7.0* 6.3*  HCT 24.2* 21.1* 23.3* 22.0* 19.9*  MCV 86.4 84.4 83.8 84.3 85.4  PLT 176 185 197 182 323   Basic Metabolic Panel:  Recent Labs  Lab 01/15/22 0545 01/16/22 0403 01/17/22 0615 01/18/22 0352 01/19/22 0528 01/20/22 0800  NA 134* 135  --  137 139 139  K 4.0 3.7  --  4.6 4.7 5.0  CL 96* 97*  --  103 103 105  CO2 27 29  --  '29 29 28  '$ GLUCOSE 92 117*  --  113* 115* 116*  BUN 65* 70*  --  91* 99* 100*  CREATININE 4.68* 4.91*  --  5.03* 5.07* 4.85*  CALCIUM 9.3 9.1  --  9.5 9.9 9.7  MG 2.3 2.3 2.4 2.5* 2.6* 2.4  PHOS 4.7* 4.7* 3.1 2.2* 2.7 2.7   GFR: Estimated Creatinine Clearance: 12 mL/min (A) (by C-G formula based on SCr of 4.85 mg/dL (H)). Liver Function Tests: Recent Labs  Lab 01/15/22 0545 01/16/22 0403 01/20/22 0800  AST '23 16 15  '$ ALT '7 7 6  '$ ALKPHOS 41 41 63  BILITOT 0.7 0.5 0.5  PROT 5.2* 4.8* 5.2*  ALBUMIN 2.3* 2.1* 2.2*   No results for input(s): "LIPASE", "AMYLASE" in the last 168 hours. No results for input(s): "AMMONIA" in the last 168 hours. Coagulation Profile: No results for input(s): "INR", "PROTIME" in the last 168 hours. BNP (last 3 results) No results for input(s): "PROBNP" in the last 8760 hours. HbA1C: No results for input(s): "HGBA1C" in the last 72 hours. CBG: Recent Labs  Lab 01/19/22 1241 01/19/22 1745 01/19/22 2353 01/20/22 0541 01/20/22 0544  GLUCAP 119* 110* 117* >600* 109*   Lipid Profile: Recent Labs    01/20/22 0800  TRIG 71   Thyroid Function Tests: No results for input(s): "TSH", "T4TOTAL", "FREET4", "T3FREE", "THYROIDAB" in the last 72  hours. Sepsis Labs: No results for input(s): "PROCALCITON", "LATICACIDVEN" in the last 168 hours.  No results found for this or any previous visit (from the past 240 hour(s)).  Antimicrobials: Anti-infectives (From admission, onward)    Start     Dose/Rate Route Frequency Ordered Stop   01/10/22 1602  ceFAZolin (ANCEF) 2-4 GM/100ML-% IVPB       Note to Pharmacy: Trudie Reed S: cabinet override      01/10/22 1602 01/11/22 0414   01/07/22 1414  ceFAZolin (ANCEF) 2-4 GM/100ML-% IVPB       Note to Pharmacy: Olena Mater F: cabinet override      01/07/22 1414 01/07/22 1711   01/07/22 1345  ceFAZolin (ANCEF) IVPB 2g/100 mL premix       Note to Pharmacy: On call to OR   2 g 200 mL/hr over 30 Minutes Intravenous  Once 01/07/22 1256 01/07/22 1608   01/04/22 1530  ceFEPIme (MAXIPIME) 1 g in sodium chloride 0.9 % 100 mL IVPB        1 g 200 mL/hr over 30 Minutes Intravenous Every 24 hours 01/04/22 1445 01/12/22 2359   01/03/22 1445  piperacillin-tazobactam (ZOSYN) IVPB 3.375 g        3.375 g 100 mL/hr over 30 Minutes Intravenous  Once 01/03/22 1445 01/04/22 0813      Culture/Microbiology    Component Value Date/Time   SDES  01/05/2022 0856    URINE, SUPRAPUBIC Performed at [facility], [address], [place], [place] [address]    [facility]  01/05/2022 [phone]    NONE Performed at [facility], [address], [place], [place] [address]    CULT  01/05/2022 0856    NO GROWTH Performed at [facility], [place] [address] [place],  [address]  REPTSTATUS 01/06/2022 FINAL 01/05/2022 0856  Other culture-see note  Radiology Studies: DG Abd 2 Views  Result Date: 01/20/2022 CLINICAL DATA:  [id number] Ileus following gastrointestinal surgery ([place]) [id number] EXAM: ABDOMEN - 2 VIEW COMPARISON:  Radiograph 01/15/2022 FINDINGS: Nasogastric tube tip and side port overlie the stomach. There is persistent diffuse gaseous distension of bowel in the abdomen, not  significantly changed from prior. Suprapubic catheter overlies the pelvis. Midline skin staples. IMPRESSION: Persistent diffuse gaseous distention of bowel, not significantly changed from prior, compatible with ileus. Electronically Signed   By: Maurine Simmering M.D.   On: 01/20/2022 08:44     LOS: 16 days   Antonieta Pert, MD [facility]  01/20/2022, 11:28 AM

## 2022-01-20 NOTE — Consult Note (Signed)
PHARMACY - TOTAL PARENTERAL NUTRITION CONSULT NOTE   Indication: Prolonged ileus  Patient Measurements: Height: '5\' 9"'$  (175.3 cm) Weight: 66.4 kg (146 lb 6.2 oz) IBW/kg (Calculated) : 70.7 TPN AdjBW (KG): 73 Body mass index is 21.62 kg/m.   Assessment: 76 y.o. male with medical history significant of atonic bladder s/p suprapubic catheter, CKD Stage 4, anemia of chronic kidney disease, hypertension, gout found with small bowel obstruction 7 Day Post-Op s/p reduction of internal hernia.  This was complicated by midline wound dehiscence status post wound closure postop day #8.    Glucose / Insulin: currrently controlled, 0 units SSI used in the last 24 hrs Electrolytes: K 5.0  Phos 2.7   Mag 2.6  Renal: Scr 4.85 (baseline 2.5-3, per nephrology no HD needed), BUN 70>>91 Hepatic: LFTs WNL Intake / Output; MIVF: +1L GI Imaging:  -CT 10/11: Persistent small bowel dilatation with fluid-filled loops.  -CT 10/9:  findings favor ileus versus a partial small bowel obstruction, GI Surgeries / Procedures:  -Ex Lap 10/6: closure of opened abdominal incision wound -Ex Lap 10/3: reduction of internal hernia   Central access: DL PICC placed 10/11 TPN start date: 10/11  Nutritional Goals: Goal TPN rate is 80 mL/hr (provides 102 g of protein and 1962 kcals per day)  RD Assessment: Estimated Needs Total Energy Estimated Needs: 1800-2200kcal/day Total Protein Estimated Needs: 90-105g/day Total Fluid Estimated Needs: 1.7-2.0L/day  Current Nutrition:  NPO  10/14: did not tolerate NGT clamp trial  Plan:  Continue TPN at goal rate of 80 ml/hr -CKD4  Crcl 12 ml/min, Not on dialysis. Monitor for need to concentrate TPN/volume status  Electrolytes in TPN: Na 81mq/L, Will decrease K from 40 to 30 mEq/L on 10/16, Ca 584m/L, Will decrease Mg from 5 to 3 mEq/L on 10/15 and Increase Phos from 0 to 5 mmol/L on 10/14. Cl:Ac 1:1 -on lasix '20mg'$  IV bid Add standard MVI and trace elements to TPN, remove  chromium d/t renal insuffciency 10/15 -Thiamine '100mg'$  x3 days (completed) Initiate Sensitive q6h SSI and adjust as needed  Monitor TPN labs on Mon/Thurs, and as needed until stable  [employer]0/16/2023,10:01 AM

## 2022-01-21 ENCOUNTER — Inpatient Hospital Stay: Payer: Medicare HMO

## 2022-01-21 DIAGNOSIS — F0390 Unspecified dementia without behavioral disturbance: Secondary | ICD-10-CM | POA: Diagnosis not present

## 2022-01-21 DIAGNOSIS — N3001 Acute cystitis with hematuria: Secondary | ICD-10-CM | POA: Diagnosis not present

## 2022-01-21 DIAGNOSIS — K56609 Unspecified intestinal obstruction, unspecified as to partial versus complete obstruction: Secondary | ICD-10-CM | POA: Diagnosis not present

## 2022-01-21 DIAGNOSIS — N179 Acute kidney failure, unspecified: Secondary | ICD-10-CM | POA: Diagnosis not present

## 2022-01-21 DIAGNOSIS — D62 Acute posthemorrhagic anemia: Secondary | ICD-10-CM | POA: Diagnosis not present

## 2022-01-21 DIAGNOSIS — K560 Paralytic ileus: Secondary | ICD-10-CM | POA: Diagnosis not present

## 2022-01-21 DIAGNOSIS — T8130XA Disruption of wound, unspecified, initial encounter: Secondary | ICD-10-CM

## 2022-01-21 DIAGNOSIS — E43 Unspecified severe protein-calorie malnutrition: Secondary | ICD-10-CM | POA: Diagnosis not present

## 2022-01-21 DIAGNOSIS — R112 Nausea with vomiting, unspecified: Secondary | ICD-10-CM

## 2022-01-21 DIAGNOSIS — T839XXA Unspecified complication of genitourinary prosthetic device, implant and graft, initial encounter: Secondary | ICD-10-CM

## 2022-01-21 LAB — TYPE AND SCREEN
ABO/RH(D): A POS
Antibody Screen: NEGATIVE
Unit division: 0

## 2022-01-21 LAB — GLUCOSE, CAPILLARY
Glucose-Capillary: 116 mg/dL — ABNORMAL HIGH (ref 70–99)
Glucose-Capillary: 114 mg/dL — ABNORMAL HIGH (ref 70–99)
Glucose-Capillary: 130 mg/dL — ABNORMAL HIGH (ref 70–99)
Glucose-Capillary: 97 mg/dL (ref 70–99)

## 2022-01-21 LAB — BPAM RBC
Blood Product Expiration Date: 202311152359
ISSUE DATE / TIME: 202310161026
Unit Type and Rh: 6200

## 2022-01-21 MED ORDER — ONDANSETRON HCL 4 MG/2ML IJ SOLN
4.0000 mg | Freq: Four times a day (QID) | INTRAMUSCULAR | Status: DC | PRN
  Filled 2022-01-21: qty 2

## 2022-01-21 MED ORDER — TRAVASOL 10 % IV SOLN
INTRAVENOUS | Status: AC
  Filled 2022-01-21: qty 1017.6

## 2022-01-21 MED ORDER — ENSURE ENLIVE PO LIQD: 237.0000 mL | Freq: Three times a day (TID) | ORAL | Status: DC

## 2022-01-21 NOTE — Progress Notes (Signed)
[facility]  ROUNDING NOTE   Subjective:   Patient seen laying in bed, safety sitter at bedside No family at bedside NG tube still in place, moderate amount of drainage noted Suprapubic catheter  TPN infusing    Objective:  Vital signs in last 24 hours:  Temp:  [98.2 F (36.8 C)-99 F (37.2 C)] 98.2 F (36.8 C) (10/17 1525) Pulse Rate:  [70-79] 73 (10/17 1525) Resp:  [15-20] 20 (10/17 1525) BP: (144-159)/(50-72) 154/55 (10/17 1525) SpO2:  [100 %] 100 % (10/17 1525) Weight:  [62.2 kg-64.7 kg] 64.7 kg (10/17 1134)  Weight change:  Filed Weights   01/18/22 0405 01/21/22 0419 01/21/22 1134  Weight: 66.4 kg 62.2 kg 64.7 kg    Intake/Output: I/O last 3 completed shifts: In: 3165.5 [I.V.:2752.1; Blood:413.3] Out: 9983 [Urine:3175]   Intake/Output this shift:  Total I/O In: -  Out: 850 [Urine:850]  Physical Exam: General: NAD  Head: Normocephalic, atraumatic.   Eyes: Anicteric  Lungs:  Clear to auscultation, normal effort  Heart: Regular rate and rhythm  Abdomen:  Soft, nontender  Extremities:  No peripheral edema.  Neurologic: Nonfocal, moving all four extremities  Skin: No lesions  Access: None  GU-suprapubic catheter-hematuria  Basic Metabolic Panel: Recent Labs  Lab 01/15/22 0545 01/16/22 0403 01/17/22 0615 01/18/22 0352 01/19/22 0528 01/20/22 0800  NA 134* 135  --  137 139 139  K 4.0 3.7  --  4.6 4.7 5.0  CL 96* 97*  --  103 103 105  CO2 27 29  --  '29 29 28  '$ GLUCOSE 92 117*  --  113* 115* 116*  BUN 65* 70*  --  91* 99* 100*  CREATININE 4.68* 4.91*  --  5.03* 5.07* 4.85*  CALCIUM 9.3 9.1  --  9.5 9.9 9.7  MG 2.3 2.3 2.4 2.5* 2.6* 2.4  PHOS 4.7* 4.7* 3.1 2.2* 2.7 2.7    Liver Function Tests: Recent Labs  Lab 01/15/22 0545 01/16/22 0403 01/20/22 0800  AST '23 16 15  '$ ALT '7 7 6  '$ ALKPHOS 41 41 63  BILITOT 0.7 0.5 0.5  PROT 5.2* 4.8* 5.2*  ALBUMIN 2.3* 2.1* 2.2*   No results for input(s): "LIPASE", "AMYLASE" in the last 168  hours. No results for input(s): "AMMONIA" in the last 168 hours.  CBC: Recent Labs  Lab 01/15/22 0545 01/16/22 0403 01/17/22 0615 01/19/22 0528 01/20/22 0800  WBC 16.1* 9.8 9.1 8.1 6.6  NEUTROABS  --   --   --  5.7 4.8  HGB 7.6* 6.6* 7.5* 7.0* 6.3*  HCT 24.2* 21.1* 23.3* 22.0* 19.9*  MCV 86.4 84.4 83.8 84.3 85.4  PLT 176 185 197 182 175    Cardiac Enzymes: No results for input(s): "CKTOTAL", "CKMB", "CKMBINDEX", "TROPONINI" in the last 168 hours.  BNP: Invalid input(s): "POCBNP"  CBG: Recent Labs  Lab 01/20/22 1717 01/20/22 2329 01/21/22 0503 01/21/22 1144 01/21/22 1700  GLUCAP 83 118* 116* 114* 130*    Microbiology: Results for orders placed or performed during the hospital encounter of 01/03/22  Urine Culture     Status: None   Collection Time: 01/05/22  8:56 AM   Specimen: Urine, Suprapubic  Result Value Ref Range Status   Specimen Description   Final    URINE, SUPRAPUBIC Performed at [facility], [address], [place], [place] [address]    Special Requests   Final    NONE Performed at [facility], [address], [place], [place] [address]    Culture  Final    NO GROWTH Performed at [facility], [place] [address] [place], [place] [address]    Report Status 01/06/2022 FINAL  Final    Coagulation Studies: No results for input(s): "LABPROT", "INR" in the last 72 hours.  Urinalysis: No results for input(s): "COLORURINE", "LABSPEC", "PHURINE", "GLUCOSEU", "HGBUR", "BILIRUBINUR", "KETONESUR", "PROTEINUR", "UROBILINOGEN", "NITRITE", "LEUKOCYTESUR" in the last 72 hours.  Invalid input(s): "APPERANCEUR"    Imaging: DG Abd Portable 1V  Result Date: 01/21/2022 CLINICAL DATA:  Nasogastric tube placement. EXAM: PORTABLE ABDOMEN - 1 VIEW COMPARISON:  CT 01/15/2022. Radiographs 01/20/2022, 01/14/2022 and 01/06/2022. FINDINGS: 1429 hours. Single semi erect view of the abdomen centered at the gastroesophageal junction demonstrates  an enteric tube projecting over the left upper quadrant of the abdomen. The side-hole is below the GE junction. Diffuse bowel distension again noted, mildly increased from yesterday. There is atelectasis at the right lung base. Abdominal skin staples are in place. IMPRESSION: 1. Enteric tube projects over the left upper quadrant of the abdomen, likely in the mid stomach. 2. Mildly increased bowel distension, still likely reflecting a postoperative ileus. Electronically Signed   By: Richardean Sale M.D.   On: 01/21/2022 15:12   DG Abd 2 Views  Result Date: 01/20/2022 CLINICAL DATA:  [id number] Ileus following gastrointestinal surgery ([place]) [id number] EXAM: ABDOMEN - 2 VIEW COMPARISON:  Radiograph 01/15/2022 FINDINGS: Nasogastric tube tip and side port overlie the stomach. There is persistent diffuse gaseous distension of bowel in the abdomen, not significantly changed from prior. Suprapubic catheter overlies the pelvis. Midline skin staples. IMPRESSION: Persistent diffuse gaseous distention of bowel, not significantly changed from prior, compatible with ileus. Electronically Signed   By: Maurine Simmering M.D.   On: 01/20/2022 08:44     Medications:    sodium chloride     TPN ADULT (ION) 80 mL/hr at 01/20/22 1802   TPN ADULT (ION)      Chlorhexidine Gluconate Cloth  6 each Topical Daily   feeding supplement  237 mL Oral TID BM   furosemide  20 mg Intravenous BID   influenza vaccine adjuvanted  0.5 mL Intramuscular Tomorrow-1000   insulin aspart  0-9 Units Subcutaneous Q6H   pneumococcal 20-valent conjugate vaccine  0.5 mL Intramuscular Tomorrow-1000   polyethylene glycol  17 g Oral BID   sodium chloride flush  10-40 mL Intracatheter Q12H   sodium chloride flush  3 mL Intravenous Q12H   sodium chloride, acetaminophen **OR** acetaminophen, haloperidol lactate, hydrALAZINE, iohexol, morphine injection, ondansetron (ZOFRAN) IV, sodium chloride flush, sodium chloride flush  Assessment/ Plan:  Troy Berry. is a 76 y.o.  male with medical problems of atonic bladder status post suprapubic catheter placement, chronic kidney disease, anemia, hypertension, gout    was admitted on 01/03/2022 for Acute on chronic renal failure ([place]) [N17.9, N18.9] Gross hematuria [R31.0]   Acute Kidney Injury  Likely ATN due to concurrent illness, small bowel obstruction. Patient continues to have adequate urine output.  We will continue to follow patient's labs Patient's creatinine is stable at 5.0.  Patient has had a creatinine of 5.0 going back to September of this year.  No acute need for dialysis at this time.  Patient will make a poor long-term dialysis patient.  We will continue to monitor.  Lab Results  Component Value Date   CREATININE 4.85 (H) 01/20/2022   CREATININE 5.07 (H) 01/19/2022   CREATININE 5.03 (H) 01/18/2022    Intake/Output Summary (Last 24 hours) at 01/21/2022 1739 Last  data filed at 01/21/2022 1552 Gross per 24 hour  Intake 1078.72 ml  Output 2400 ml  Net -1321.28 ml   Hypernatremia resolved . 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    Small bowel obstruction Status post exploratory laparotomy on 01/07/2022. Experienced wound dehiscence with repair in OR on 01/10/22. TPN infusing   LOS: 17 Troy Berry s Troy Berry 10/17/20235:39 PM

## 2022-01-21 NOTE — Progress Notes (Signed)
Nutrition Follow Up Note   DOCUMENTATION CODES:   Severe malnutrition in context of chronic illness  INTERVENTION:   TPN per pharmacy- provides 1962kcal/day and 102g/day protein   Daily weights   Ensure Enlive po TID, each supplement provides 350 kcal and 20 grams of protein.  NUTRITION DIAGNOSIS:   Severe Malnutrition related to chronic illness (dementia, CKD, internal hernia) as evidenced by severe fat depletion, severe muscle depletion. -ongoing   GOAL:   Patient will meet greater than or equal to 90% of their needs -met   MONITOR:   PO intake, Supplement acceptance, Diet advancement, Labs, Weight trends, Skin, I & O's, TPN  ASSESSMENT:   76 y/o male with h/o atonic bladder with chronic foley, CKD IV, dementia, HTN and gout who is admitted with SBO now s/p ex lap with reduction of internal hernia 50/3 complicated by wound dehiscence requiring reopening of recent laparotomy and closure of abdominal wound 10/6.  Pt tolerating TPN at goal rate. Refeed labs table. Triglycerides wnl. Pt advanced to a full liquid diet today. Pt with fair appetite and oral intake but continues to have abdominal distension and intermittent vomiting. KUB from today reports continued ileus. Recommend continue TPN until pt is able to take in adequate oral intake. NGT removed. Per chart, pt is down ~23lbs since admission but appears fairly weight stable since TPN initiation.    Medications reviewed and include: lasix, insulin, miralax  Labs reviewed: K 5.0 wnl, BUN 100(H), creat 4.85(H), P 2.7 wnl, Mg 2.4 wnl Triglycerides 71 Hgb 6.3(L), Hct 19.9(L) Cbgs- 114, 116 x 24 hrs  Diet Order:   Diet Order             Diet full liquid Room service appropriate? No; Fluid consistency: Thin  Diet effective now                  EDUCATION NEEDS:   Education needs have been addressed  Skin:  Skin Assessment: Reviewed RN Assessment (incision abdomen)  Last BM:  10/16- type 6  Height:   Ht  Readings from Last 1 Encounters:  01/03/22 _0  (1.753 m)    Weight:   Wt Readings from Last 1 Encounters:  01/21/22 62.2 kg    Ideal Body Weight:  72.7 kg  BMI:  Body mass index is 20.25 kg/m.  Estimated Nutritional Needs:   Kcal:  1800-2200kcal/day  Protein:  90-105g/day  Fluid:  1.7-2.0L/day  Koleen Distance MS, RD, LDN Please refer to [facility] for RD and/or RD on-call/weekend/after hours pager

## 2022-01-21 NOTE — Consult Note (Signed)
PHARMACY - TOTAL PARENTERAL NUTRITION CONSULT NOTE   Indication: Prolonged ileus  Patient Measurements: Height: '5\' 9"'$  (175.3 cm) Weight: 62.2 kg (137 lb 2 oz) IBW/kg (Calculated) : 70.7 TPN AdjBW (KG): 73 Body mass index is 20.25 kg/m.   Assessment: 76 y.o. male with medical history significant of atonic bladder s/p suprapubic catheter, CKD Stage 4, anemia of chronic kidney disease, hypertension, gout found with small bowel obstruction 7 Day Post-Op s/p reduction of internal hernia.  This was complicated by midline wound dehiscence status post wound closure postop day #8.    Glucose / Insulin: currrently controlled, 0 units SSI used in the last 24 hrs Electrolytes: K 5.0  Phos 2.7   Mag 2.6  Renal: Scr 4.85 (baseline 2.5-3, per nephrology no HD needed), BUN 70>>91 Hepatic: LFTs WNL Intake / Output; MIVF: +1L GI Imaging:  -CT 10/11: Persistent small bowel dilatation with fluid-filled loops.  -CT 10/9:  findings favor ileus versus a partial small bowel obstruction, GI Surgeries / Procedures:  -Ex Lap 10/6: closure of opened abdominal incision wound -Ex Lap 10/3: reduction of internal hernia   Central access: DL PICC placed 10/11 TPN start date: 10/11  Nutritional Goals: Goal TPN rate is 80 mL/hr (provides 102 g of protein and 1962 kcals per day)  RD Assessment: Estimated Needs Total Energy Estimated Needs: 1800-2200kcal/day Total Protein Estimated Needs: 90-105g/day Total Fluid Estimated Needs: 1.7-2.0L/day  Current Nutrition:  NPO  10/14: did not tolerate NGT clamp trial  Plan:  Continue TPN at goal rate of 80 ml/hr -CKD4  Crcl 12 ml/min, Not on dialysis. Monitor volume status and concentrate TPN if needed  Electrolytes in TPN: Na 47mq/L, Will decrease K from 40 to 30 mEq/L on 10/16, Ca 556m/L, Will decrease Mg from 5 to 3 mEq/L on 10/15 and Increase Phos from 0 to 5 mmol/L on 10/14. Cl:Ac 1:1 -on lasix '20mg'$  IV bid Add standard MVI and trace elements to TPN, remove  chromium d/t renal insuffciency 10/15 -Thiamine '100mg'$  x3 days (completed) Continue Sensitive q6h SSI and adjust as needed  Monitor TPN labs on Mon/Thurs, and as needed until stable  [employer]0/17/2023,7:36 AM

## 2022-01-21 NOTE — Progress Notes (Signed)
Occupational Therapy Treatment Patient Details Name: Troy Berry. MRN: [id number] DOB: 08-31-1945 Today's Date: 01/21/2022   History of present illness Patient is a 76 y.o.  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 He was admitted on 01/03/2022 for Acute on chronic renal failure, small bowel obstruction  s/p reduction of internal hernia. patient with midline wound dehiscence status post wound closure on 10/6.   OT comments  Upon entering session, pt asleep in bed with family present. Required VC for alertness throughout session. Tx session targeted improving functional balance during ADL tasks in preparation for improved functional transfers and ADL task completion. Pt required Max A +2 to complete supine to sit. Once sitting EOB pt required total A to maintain static sitting balance 2/2 heavy R lateral lean with pt attempting to lay back down. Pt returned to supine and required Max A to complete log rolling L/R in order to change linens. Pt followed one step commands inconsistently this date. Pt left as received with all needs in reach. Mitts donned. Sitter assisting with mobility this date. Pt is making progress toward goal completion. D/C recommendation remains appropriate. OT will continue to follow acutely.     Recommendations for follow up therapy are one component of a multi-disciplinary discharge planning process, led by the attending physician.  Recommendations may be updated based on patient status, additional functional criteria and insurance authorization.    Follow Up Recommendations  Skilled nursing-short term rehab (<3 hours/day)    Assistance Recommended at Discharge Frequent or constant Supervision/Assistance  Patient can return home with the following  A lot of help with walking and/or transfers;A lot of help with bathing/dressing/bathroom;Assistance with feeding;Assistance  with cooking/housework;Direct supervision/assist for medications management;Direct supervision/assist for financial management;Assist for transportation   Equipment Recommendations  Other (comment) (defer to next venue of care)    Recommendations for Other Services      Precautions / Restrictions Precautions Precautions: Fall Precaution Comments: suprapubic catheter; abdominal incision Restrictions Weight Bearing Restrictions: No       Mobility Bed Mobility Overal bed mobility: Needs Assistance Bed Mobility: Supine to Sit, Sit to Supine Rolling: Max assist   Supine to sit: Max assist, +2 for physical assistance Sit to supine: Max assist, +2 for physical assistance        Transfers                   General transfer comment: pt did not sit long enough to attempt standing     Balance Overall balance assessment: Needs assistance Sitting-balance support: Bilateral upper extremity supported, Feet supported Sitting balance-[name]: Poor Sitting balance - Comments: Total A to maintain static sitting balance, pt with heavy R lateral lean, attempting to lay back down depsite Max multimodal cues Postural control: Right lateral lean                                 ADL either performed or assessed with clinical judgement   ADL Overall ADL's : Needs assistance/impaired                                       General ADL Comments: attempted grooming tasks while sitting EOB but pt unable to follow single step commands or open eyes    Extremity/Trunk Assessment  Upper Extremity Assessment Upper Extremity Assessment: Difficult to assess due to impaired cognition;Generalized weakness   Lower Extremity Assessment Lower Extremity Assessment: Difficult to assess due to impaired cognition;Generalized weakness        Vision Patient Visual Report: No change from baseline     Perception     Praxis      Cognition Arousal/Alertness:  Lethargic Behavior During Therapy: Flat affect Overall Cognitive Status: History of cognitive impairments - at baseline Area of Impairment: Following commands, Problem solving, Attention, Safety/judgement, Orientation                 Orientation Level: Disoriented to, Place, Time, Situation, Person Current Attention Level: Selective   Following Commands: Follows one step commands inconsistently Safety/Judgement: Decreased awareness of safety, Decreased awareness of deficits   Problem Solving: Slow processing, Decreased initiation, Difficulty sequencing, Requires verbal cues, Requires tactile cues General Comments: 1:1 sitter and B mitts in place for safety        Exercises      Shoulder Instructions       General Comments no drainage noted abdominal incision    Pertinent Vitals/ Pain       Pain Assessment Pain Assessment: Faces Faces Pain Scale: Hurts a little bit Pain Location: abdomen Pain Descriptors / Indicators: Operative site guarding Pain Intervention(s): Limited activity within patient's tolerance, Monitored during session, Repositioned  Home Living                                          Prior Functioning/Environment              Frequency  Min 2X/week        Progress Toward Goals  OT Goals(current goals can now be found in the care plan section)  Progress towards OT goals: Progressing toward goals  Acute Rehab OT Goals Patient Stated Goal: to get stronger OT Goal Formulation: With family Time For Goal Achievement: 01/24/22 Potential to Achieve Goals: [place] Discharge plan remains appropriate;Frequency remains appropriate    Co-evaluation                 AM-PAC OT "6 Clicks" Daily Activity     Outcome Measure   Help from another person eating meals?: A Little Help from another person taking care of personal grooming?: A Lot Help from another person toileting, which includes using toliet, bedpan, or  urinal?: A Lot Help from another person bathing (including washing, rinsing, drying)?: A Lot Help from another person to put on and taking off regular upper body clothing?: A Little Help from another person to put on and taking off regular lower body clothing?: A Lot 6 Click Score: 14    End of Session Equipment Utilized During Treatment: Other (comment) (none)  OT Visit Diagnosis: Unsteadiness on feet (R26.81);Repeated falls (R29.6);Muscle weakness (generalized) (M62.81)   Activity Tolerance Patient limited by lethargy   Patient Left in bed;with call bell/phone within reach;with bed alarm set;with nursing/sitter in room;with restraints reapplied (sitter present)   Nurse Communication Mobility status        Time: 1610-9604 OT Time Calculation (min): 15 min  Charges: OT General Charges $OT Visit: 1 Visit OT Treatments $Therapeutic Activity: 8-22 mins  [facility] MS, OTR/L ascom [phone]  01/21/22, 4:53 PM

## 2022-01-21 NOTE — Progress Notes (Signed)
Physical Therapy Treatment Patient Details Name: Troy Berry. MRN: [id number] DOB: 28-Jan-1946 Today's Date: 01/21/2022   History of Present Illness Patient is a 76 y.o.  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 He was admitted on 01/03/2022 for Acute on chronic renal failure, small bowel obstruction  s/p reduction of internal hernia. patient with midline wound dehiscence status post wound closure on 10/6.    PT Comments    Pt sleeping in bed upon PT arrival but woken with vc's.  During session pt mod to max assist (for trunk) semi-supine to sitting edge of bed.  Pt briefly sitting on edge of bed but pt then trying to lay back down (unable to encourage pt to stay sitting) so pt repositioned in bed for safety and comfort.  Pt appearing confused and with difficulty following cues during session.  Will continue to focus on progressive functional mobility during hospitalization.   Recommendations for follow up therapy are one component of a multi-disciplinary discharge planning process, led by the attending physician.  Recommendations may be updated based on patient status, additional functional criteria and insurance authorization.  Follow Up Recommendations  Skilled nursing-short term rehab (<3 hours/day) Can patient physically be transported by private vehicle: No   Assistance Recommended at Discharge Frequent or constant Supervision/Assistance  Patient can return home with the following Two people to help with walking and/or transfers;A lot of help with bathing/dressing/bathroom;Assist for transportation;Help with stairs or ramp for entrance;Direct supervision/assist for medications management;Assistance with feeding;Assistance with cooking/housework;Direct supervision/assist for financial management   Equipment Recommendations  Other (comment) (TBD at next facility)    Recommendations  for Other Services       Precautions / Restrictions Precautions Precautions: Fall Precaution Comments: suprapubic catheter; abdominal incision Restrictions Weight Bearing Restrictions: No     Mobility  Bed Mobility Overal bed mobility: Needs Assistance Bed Mobility: Supine to Sit, Sit to Supine     Supine to sit: Mod assist, Max assist (assist for trunk) Sit to supine: Min assist (for safety)   General bed mobility comments: vc's for safety and technique    Transfers                   General transfer comment: pt did not sit long enough to attempt standing    Ambulation/Gait                   Stairs             Wheelchair Mobility    Modified Rankin (Stroke Patients Only)       Balance Overall balance assessment: Needs assistance Sitting-balance support: Bilateral upper extremity supported, Feet supported Sitting balance-[name]: Fair Sitting balance - Comments: steady static sitting                                    Cognition Arousal/Alertness:  (pt sleeping upon PT arrival but became more alert after woken up) Behavior During Therapy: Flat affect Overall Cognitive Status: Impaired/Different from baseline Area of Impairment: Following commands, Problem solving, Attention, Safety/judgement                   Current Attention Level: Selective   Following Commands: Follows one step commands inconsistently Safety/Judgement: Decreased awareness of safety, Decreased awareness of deficits   Problem Solving: Slow processing, Decreased initiation, Difficulty sequencing, Requires  verbal cues, Requires tactile cues General Comments: Pt appearing confused when talking        Exercises      General Comments General comments (skin integrity, edema, etc.): no drainage noted abdominal incision.  Nursing cleared pt for participation in physical therapy.  Sitter present.      Pertinent Vitals/Pain Pain  Assessment Pain Assessment: Faces Faces Pain Scale: Hurts a little bit Pain Location: abdomen Pain Descriptors / Indicators: Operative site guarding Pain Intervention(s): Limited activity within patient's tolerance, Monitored during session, Repositioned    Home Living                          Prior Function            PT Goals (current goals can now be found in the care plan section) Acute Rehab PT Goals Patient Stated Goal: patient unable to participate with goal setting today PT Goal Formulation: With patient Time For Goal Achievement: 01/24/22 Potential to Achieve Goals: Fair Progress towards PT goals: Not progressing toward goals - comment (limited per pt's level of participation)    Frequency    Min 2X/week      PT Plan Current plan remains appropriate    Co-evaluation              AM-PAC PT "6 Clicks" Mobility   Outcome Measure  Help needed turning from your back to your side while in a flat bed without using bedrails?: A Lot Help needed moving from lying on your back to sitting on the side of a flat bed without using bedrails?: A Lot Help needed moving to and from a bed to a chair (including a wheelchair)?: Total Help needed standing up from a chair using your arms (e.g., wheelchair or bedside chair)?: Total Help needed to walk in hospital room?: Total Help needed climbing 3-5 steps with a railing? : Total 6 Click Score: 8    End of Session   Activity Tolerance: Patient tolerated treatment well Patient left: in bed;with call bell/phone within reach;with bed alarm set;with nursing/sitter in room (Sitter in room) Nurse Communication: Precautions PT Visit Diagnosis: Unsteadiness on feet (R26.81);Muscle weakness (generalized) (M62.81)     Time: 1624-4695 PT Time Calculation (min) (ACUTE ONLY): 16 min  Charges:  $Therapeutic Activity: 8-22 mins                     Leitha Bleak, PT 01/21/22, 2:13 PM

## 2022-01-21 NOTE — Progress Notes (Signed)
Progress Note   Patient: Troy Berry. ZJI:[id number] DOB: 01-20-1946 DOA: 01/03/2022     17 DOS: the patient was seen and examined on 01/21/2022   Brief hospital course: 76 year old male past medical history have atonic bladder status post suprapubic catheter plus stage IV chronic kidney disease and secondary hypertension who presented to the emergency room on 9/30 with complaints of suprapubic catheter dysfunction as well as abdominal distention.  Patient found to have possible SBO versus ileus.   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 General surgery and urology were consulted. Started on cefepime for possible UTI.   NG tube placed for abdominal distention.  Patient however did not improve with conservative management. 10/3, patient underwent ex lap and reduction of internal hernia. Patient was then noted to have acute onset of M-End and serous drainage through the wound, wound dehiscence and omental exposure.  Laparotomy wound 10/6, patient was taken to OR for wound closure.  He was noted to have distended bladder despite suprapubic catheter.  Bloody urine was aspirated.  There is no evidence of bowel obstruction 10/11, decision made to start patient on TPN with PICC line. Course also complicated by wound dehiscence and exposed omentum at laparotomy site Continues to have blood in Foley catheter, continues to need TPN followed by general surgery, nephrology. 10/17: Vomited once and abdomen getting more distended, surgery following   Assessment and Plan: * Acute renal failure superimposed on stage 4 chronic kidney disease ([place]) Patient has a history of CKD stage IV with most recent GFR ranging between 24 and 17.  On admission, creatinine elevated at 4.85 compared to prior 3.5.  This is likely secondary to obstructive  uropathy in the setting of suprapubic catheter dysfunction.  Urology has replaced suprapubic catheter with cystogram confirming tip within bladder.   Renal function continued to worsen, likely due to ATN in the setting of small bowel obstruction and recurrent hypotension.  Nephrology following, patient not felt to be candidate for dialysis.  Now relatively stable with GFR around 12, continues to have good urine output.  Lab Results  Component Value Date   CREATININE 4.85 (H) 01/20/2022   CREATININE 5.07 (H) 01/19/2022   CREATININE 5.03 (H) 01/18/2022    SBO (small bowel obstruction) (HCC) Small bowel obstruction.  Status post reduction of ventral hernia complicated by midline wound dehiscence status post wound closure  Initial CT renal study with evidence of some small bowel dilation with no focal point demonstrated.  CT cystogram demonstrates persistent small bowel dilation with evidence of focal transition and in the central mesentery.  Findings are consistent with small bowel obstruction.  General surgery following. With no improvement, patient taken to the OR for ex lap and reduction of hernia on 10/3.   Obstruction remaining persistent and patient to be started on TPN.  PICC line placed. Abdominal x-ray showing persistent small and large bowel dilation consistent with ileus -Reported multiple bowel movements the last couple days -Surgery team has removed NG tube and started full liquid as he tolerated clear liquid diet yesterday.  Nursing reported 1 episode of vomiting today.  His abdomen is getting somewhat more distended  Suprapubic catheter dysfunction [facility]) Patient has a history of atonic bladder requiring suprapubic catheter placement approximately 1 month ago on 12/05/2021.  On 12/30/2021, catheter was replaced by IR.  Since that time, patient has  been experiencing difficulties including hematuria and poor urinary drainage.  He was reevaluated by urology and IR on 9/27 without any  improvement.  On admission, it was discovered that suprapubic catheter tip was not in the bladder but adjacent to the small bowel.  Urology consulted and suprapubic catheter replaced with cystogram demonstrating appropriate placement within bladder.  - Urology following; appreciate their recommendations.  Anemia Due to acute blood loss anemia. Patient has a history of anemia of chronic kidney disease on oral iron supplementation.  Hemoglobin on admission below baseline at 7.7.  Likely in the setting of large volume hematuria over the previous 5 days prior to admission.  On 9/30, 10/12, & 10/16 -1 PRBC transfusion on each of those dates.  Total 3 PRBC transfusion.  We will recheck CBC tomorrow  Hematuria Secondary to suprapubic catheter dysfunction  Urinary tract infection Secondary to obstructive uropathy.  Has been on cefepime since admission.  Procalcitonin on 10/3 elevated at 8.6 (no previous reference.)  Repeat labs in the morning.  Completed course of cefepime, off antibiotics  Hyponatremia-resolved as of 01/06/2022 Sodium decreased at 128 on admission. Suspect this is secondary to acute on chronic renal failure.  Given gross hematuria, unable to obtain urine studies for further evaluation.  Following hydration, sodium up to 481  Metabolic acidosis Secondary to acute on chronic renal failure with elevated BUN.  No indication for bicarb at this time.  Essential hypertension Patient currently takes amlodipine, carvedilol, and Lasix for hypertension.    - Restart home amlodipine and carvedilol - Holding home Lasix given AKI  Patient started to have recurrent hypotension and antihypertensive stopped and IV fluids restarted  Wound dehiscence Following exploratory laparotomy, wound dehisced requiring patient to go back to the OR on 10/6.  Surgery following  Protein-calorie malnutrition, severe Nutrition Status: Nutrition Problem: Severe Malnutrition Etiology: chronic illness (dementia,  CKD, internal hernia) Signs/Symptoms: severe fat depletion, severe muscle depletion   on TPN    Hypokalemia Hesitant to aggressively replace given acute on chronic advanced renal failure.  Potassium 2.8.  Dementia without behavioral disturbance (HCC) Stable at baseline.        Subjective: Complains of abdominal distention.  Feels nauseous. Sitter at bedside  Physical Exam: Vitals:   01/21/22 1239 01/21/22 1309 01/21/22 1525 01/21/22 1924  BP:   (!) 154/55 (!) 157/62  Pulse:   73 99  Resp: '18 18 20 18  '$ Temp:   98.2 F (36.8 C) 98.2 F (36.8 C)  TempSrc:    Oral  SpO2:   100% (!) 89%  Weight:      Height:       76 year old male lying in the bed in no acute distress Lungs clear to auscultation bilaterally Heart regular rate and rhythm Abdomen: Soft, mild distention. No guarding Neuro: alert, awake, non-focal  Data Reviewed:  No labs today  Family Communication: None  Disposition: Status is: Inpatient Remains inpatient appropriate because: SBO mgmt   Planned Discharge Destination: Skilled nursing facility    DVT prophylaxis-SCDs Time spent: 35 minutes  Author: Max Sane, MD 01/21/2022 9:04 PM  For on call review [email or web address].

## 2022-01-21 NOTE — Plan of Care (Signed)
Pt alert to self. Does not respond to questions appropriately. Pt given 1 x dose of morphine due to pt exhibited signs of pain. This am morphine effective and pt resting.  Problem: Education: Goal: Knowledge of General Education information will improve Description: Including pain rating scale, medication(s)/side effects and non-pharmacologic comfort measures Outcome: Not Progressing   Problem: Health Behavior/Discharge Planning: Goal: Ability to manage health-related needs will improve Outcome: Not Progressing   Problem: Coping: Goal: Level of anxiety will decrease Outcome: Not Progressing   Problem: Clinical Measurements: Goal: Ability to maintain clinical measurements within normal limits will improve Outcome: Progressing Goal: Will remain free from infection Outcome: Progressing Goal: Diagnostic test results will improve Outcome: Progressing Goal: Respiratory complications will improve Outcome: Progressing Goal: Cardiovascular complication will be avoided Outcome: Progressing   Problem: Activity: Goal: Risk for activity intolerance will decrease Outcome: Progressing   Problem: Nutrition: Goal: Adequate nutrition will be maintained Outcome: Progressing

## 2022-01-21 NOTE — Progress Notes (Addendum)
Palliative Care Progress Note, Assessment & Plan   [name].       Date: 01/21/2022 DOB: Jan 08, 1946  Age: 76 y.o. MRN#: [id number] Attending Physician: Max Sane, MD Primary Care Physician: [facility] [place] Date: 01/03/2022  Reason for Consultation/Follow-up: Establishing goals of care  Subjective: Patient is sitting up in bed in no apparent distress.  He acknowledges my presence and is able to make his wishes known.  His patient care tech is at bedside giving him bites of food and sips of drinks.    HPI: 76 y.o. male  with past medical history of atonic bladder status post suprapubic catheter, CKD (stage IV), and secondary hypertension admitted on 01/03/2022 with complaints of supra pubic catheter dysfunction and abdominal distention.  Hospital course includes ex lap and reduction of internal hernia as well as additional ex lap for wound closure.  Patient has received PRBCs due to low hemoglobin, Foley catheter remains, and TPN has been initiated via PICC line. Patient is being followed by general surgery and nephrology.   PMT was consulted to discuss goals of care.  Summary of counseling/coordination of care: After reviewing the patient's chart and assessing the patient at bedside, I attempted to speak with patient in regards to current medical situation.  He appeared to be in discomfort.  He denies pain but endorsed tenderness of his abdomen.  Despite vomiting prior to current food intake, patient continued to say he wanted to keep eating and drinking.  He continues to repeat that he wants to get out of the hospital. No family at bedside.   Patient's abdomen feels slightly more distended today than yesterday.  He is guarding it during examination more so than  he did yesterday.  As per patient care tech, patient vomited just prior to eating this meal.  She shares that it was food and not blood colored/tinged. She also shares he is passing gas but has not had any BMs today.    Attending and general surgery made aware of the vomiting episode.   Given current clinical status, it is not appropriate to continue [place] discussion at this time. PMT will continue to monitor the patient and to address [place] and advanced care planning needs.   Addendum: During afternoon rounds I met with patient's wife Troy Berry and daughter Troy Berry at bedside.  We discussed patient's vomiting and abdominal distention noted earlier today.  NG tube has been replaced.  Patient's wife is concerned that patient continues to go without nutrition.  Discussed use of IV fluids for hydration and need for bowel rest with NG to avoid future vomiting/aspiration events.  Discussed CODE STATUS with patient's wife and daughter.  Patient's wife shares she wants to talk to the rest of the family in order to make a good decision.  Patient's daughter Troy Berry present shares she would likely not want patient to ever be placed on a ventilator.  She believes this is not in line with what the patient would want.  They are not prepared to make changes to plan of care at this moment.  Hard choices for loving people book given for patient's wife and family to review.  Full code and  full scope remains.  PMT contact information given to wife.  PMT will continue to follow patient throughout his hospitalization.  Code Status: Full code  Prognosis: Unable to determine  Discharge Planning: To Be Determined  Physical Exam Constitutional:      General: He is not in acute distress.    Appearance: He is normal weight. He is not toxic-appearing.  HENT:     Head: Normocephalic.     Mouth/Throat:     Mouth: Mucous membranes are moist.  Eyes:     Pupils: Pupils are equal, round, and reactive to light.  Cardiovascular:      Rate and Rhythm: Normal rate.     Pulses: Normal pulses.  Abdominal:     General: There is distension.     Palpations: Abdomen is soft.     Tenderness: There is guarding.  Skin:    General: Skin is warm and dry.  Neurological:     Mental Status: He is alert. Mental status is at baseline.     Comments: Pt mumbles but alert and oriented to self, place, and situation  Psychiatric:        Mood and Affect: Mood normal.        Behavior: Behavior normal.             Palliative Assessment/Data: 50%    Total Time 50 minutes  Greater than 50%  of this time was spent counseling and coordinating care related to the above assessment and plan.  Thank you for allowing the Palliative Medicine Team to assist in the care of this patient.  Baker Ilsa Iha, FNP-BC Palliative Medicine Team Team Phone # [phone]

## 2022-01-21 NOTE — Progress Notes (Signed)
Patient ID: Troy Berry., male   DOB: Aug 08, 1945, 76 y.o.   MRN: [id number]     [place] Hospital Day(s): 17.   Interval History: Patient seen and examined.  As per nurse there is no report of nausea or vomiting.  There were a couple of bowel movement yesterday.  Vital signs in last 24 hours: [min-max] current  Temp:  [97.6 F (36.4 C)-99 F (37.2 C)] 99 F (37.2 C) (10/17 0735) Pulse Rate:  [63-79] 71 (10/17 0735) Resp:  [12-20] 18 (10/17 0735) BP: (105-159)/(50-72) 153/50 (10/17 0735) SpO2:  [98 %-100 %] 100 % (10/17 0735) Weight:  [62.2 kg] 62.2 kg (10/17 0419)     Height: '5\' 9"'$  (175.3 cm) Weight: 62.2 kg BMI (Calculated): 20.24   Physical Exam:  Constitutional: alert, cooperative and no distress  Respiratory: breathing non-labored at rest  Cardiovascular: regular rate and sinus rhythm  Gastrointestinal: soft, non-tender, and distended  Labs:     Latest Ref Rng & Units 01/20/2022    8:00 AM 01/19/2022    5:28 AM 01/17/2022    6:15 AM  CBC  WBC 4.0 - 10.5 K/uL 6.6  8.1  9.1   Hemoglobin 13.0 - 17.0 g/dL 6.3  7.0  7.5   Hematocrit 39.0 - 52.0 % 19.9  22.0  23.3   Platelets 150 - 400 K/uL 175  182  197       Latest Ref Rng & Units 01/20/2022    8:00 AM 01/19/2022    5:28 AM 01/18/2022    3:52 AM  CMP  Glucose 70 - 99 mg/dL 116  115  113   BUN 8 - 23 mg/dL 100  99  91   Creatinine 0.61 - 1.24 mg/dL 4.85  5.07  5.03   Sodium 135 - 145 mmol/L 139  139  137   Potassium 3.5 - 5.1 mmol/L 5.0  4.7  4.6   Chloride 98 - 111 mmol/L 105  103  103   CO2 22 - 32 mmol/L '28  29  29   '$ Calcium 8.9 - 10.3 mg/dL 9.7  9.9  9.5   Total Protein 6.5 - 8.1 g/dL 5.2     Total Bilirubin 0.3 - 1.2 mg/dL 0.5     Alkaline Phos 38 - 126 U/L 63     AST 15 - 41 U/L 15     ALT 0 - 44 U/L 6       Imaging studies: Abdominal x-ray with small and large bowel dilation consistent with ileus.  At present evaluated the images.   Assessment/Plan:  76 y.o. male with small  bowel obstruction 14 Day Post-Op s/p reduction of internal hernia.  The was complicated by midline wound dehiscence status post wound closure postop day #11.    Prolonged postoperative ileus  -Abdominal x-ray yesterday showed persistent small and large bowel dilation consistent with ileus -Clinically he has been having multiple bowel movement in the last couple of days -He has been tolerating clear liquid diet -We will remove NGT and start full liquid diet -Continue PT -I will continue to follow closely.  Arnold Long, MD

## 2022-01-22 ENCOUNTER — Inpatient Hospital Stay: Payer: Medicare HMO

## 2022-01-22 DIAGNOSIS — N184 Chronic kidney disease, stage 4 (severe): Secondary | ICD-10-CM | POA: Diagnosis not present

## 2022-01-22 DIAGNOSIS — N179 Acute kidney failure, unspecified: Secondary | ICD-10-CM | POA: Diagnosis not present

## 2022-01-22 DIAGNOSIS — K560 Paralytic ileus: Secondary | ICD-10-CM | POA: Diagnosis not present

## 2022-01-22 DIAGNOSIS — E43 Unspecified severe protein-calorie malnutrition: Secondary | ICD-10-CM | POA: Diagnosis not present

## 2022-01-22 DIAGNOSIS — F0390 Unspecified dementia without behavioral disturbance: Secondary | ICD-10-CM | POA: Diagnosis not present

## 2022-01-22 LAB — BASIC METABOLIC PANEL
Anion gap: 7 (ref 5–15)
BUN: 115 mg/dL — ABNORMAL HIGH (ref 8–23)
CO2: 25 mmol/L (ref 22–32)
Calcium: 9.9 mg/dL (ref 8.9–10.3)
Chloride: 106 mmol/L (ref 98–111)
Creatinine, Ser: 4.34 mg/dL — ABNORMAL HIGH (ref 0.61–1.24)
GFR, Estimated: 13 mL/min — ABNORMAL LOW (ref >60)
Glucose, Bld: 106 mg/dL — ABNORMAL HIGH (ref 70–99)
Potassium: 4.6 mmol/L (ref 3.5–5.1)
Sodium: 138 mmol/L (ref 135–145)

## 2022-01-22 LAB — CBC
HCT: 23.4 % — ABNORMAL LOW (ref 39.0–52.0)
Hemoglobin: 7.5 g/dL — ABNORMAL LOW (ref 13.0–17.0)
MCH: 27.6 pg (ref 26.0–34.0)
MCHC: 32.1 g/dL (ref 30.0–36.0)
MCV: 86 fL (ref 80.0–100.0)
Platelets: 196 10*3/uL (ref 150–400)
RBC: 2.72 MIL/uL — ABNORMAL LOW (ref 4.22–5.81)
RDW: 15.9 % — ABNORMAL HIGH (ref 11.5–15.5)
WBC: 8.1 10*3/uL (ref 4.0–10.5)
nRBC: 0 % (ref 0.0–0.2)

## 2022-01-22 LAB — GLUCOSE, CAPILLARY: Glucose-Capillary: 93 mg/dL (ref 70–99)

## 2022-01-22 MED ORDER — SODIUM CHLORIDE 0.9 % IV SOLN
250.0000 mg | Freq: Once | INTRAVENOUS | Status: AC
  Administered 2022-01-22: 250 mg via INTRAVENOUS
  Filled 2022-01-22: qty 20

## 2022-01-22 MED ORDER — HALOPERIDOL LACTATE 5 MG/ML IJ SOLN
1.0000 mg | Freq: Four times a day (QID) | INTRAMUSCULAR | Status: DC | PRN
  Administered 2022-01-23 – 2022-01-29 (×3): 1 mg via INTRAVENOUS
  Filled 2022-01-22 (×3): qty 1

## 2022-01-22 MED ORDER — TRACE MINERALS CU-MN-SE-ZN 300-55-60-3000 MCG/ML IV SOLN
INTRAVENOUS | Status: AC
  Filled 2022-01-22: qty 696

## 2022-01-22 MED ORDER — HYDROMORPHONE HCL 1 MG/ML IJ SOLN
0.5000 mg | INTRAMUSCULAR | Status: DC | PRN
  Filled 2022-01-22: qty 0.5

## 2022-01-22 NOTE — Progress Notes (Signed)
[facility]  ROUNDING NOTE   Subjective:   Patient seen laying in bed, safety sitter at bedside No family at bedside NG tube still in place, moderate amount of drainage noted Suprapubic catheter  TPN infusing    Objective:  Vital signs in last 24 hours:  Temp:  [98.1 F (36.7 C)-98.6 F (37 C)] 98.6 F (37 C) (10/18 0616) Pulse Rate:  [68-99] 75 (10/18 0616) Resp:  [18-20] 18 (10/18 0616) BP: (121-174)/(55-65) 121/65 (10/18 0616) SpO2:  [89 %-100 %] 100 % (10/18 0616) Weight:  [64.7 kg] 64.7 kg (10/17 1134)  Weight change: 2.5 kg Filed Weights   01/18/22 0405 01/21/22 0419 01/21/22 1134  Weight: 66.4 kg 62.2 kg 64.7 kg    Intake/Output: I/O last 3 completed shifts: In: 2861.1 [I.V.:2861.1] Out: 4200 [Urine:4200]   Intake/Output this shift:  No intake/output data recorded.  Physical Exam: General: NAD  Head: Normocephalic, atraumatic.   Eyes: Anicteric  Lungs:  Clear to auscultation, normal effort  Heart: Regular rate and rhythm  Abdomen:  Soft, nontender  Extremities:  No peripheral edema.  Neurologic: Nonfocal, moving all four extremities  Skin: No lesions  Access: None  GU-suprapubic catheter-hematuria  Basic Metabolic Panel: Recent Labs  Lab 01/16/22 0403 01/17/22 0615 01/18/22 0352 01/19/22 0528 01/20/22 0800 01/22/22 0328  NA 135  --  137 139 139 138  K 3.7  --  4.6 4.7 5.0 4.6  CL 97*  --  103 103 105 106  CO2 29  --  '29 29 28 25  '$ GLUCOSE 117*  --  113* 115* 116* 106*  BUN 70*  --  91* 99* 100* 115*  CREATININE 4.91*  --  5.03* 5.07* 4.85* 4.34*  CALCIUM 9.1  --  9.5 9.9 9.7 9.9  MG 2.3 2.4 2.5* 2.6* 2.4  --   PHOS 4.7* 3.1 2.2* 2.7 2.7  --     Liver Function Tests: Recent Labs  Lab 01/16/22 0403 01/20/22 0800  AST 16 15  ALT 7 6  ALKPHOS 41 63  BILITOT 0.5 0.5  PROT 4.8* 5.2*  ALBUMIN 2.1* 2.2*   No results for input(s): "LIPASE", "AMYLASE" in the last 168 hours. No results for input(s): "AMMONIA" in the last  168 hours.  CBC: Recent Labs  Lab 01/16/22 0403 01/17/22 0615 01/19/22 0528 01/20/22 0800 01/22/22 0328  WBC 9.8 9.1 8.1 6.6 8.1  NEUTROABS  --   --  5.7 4.8  --   HGB 6.6* 7.5* 7.0* 6.3* 7.5*  HCT 21.1* 23.3* 22.0* 19.9* 23.4*  MCV 84.4 83.8 84.3 85.4 86.0  PLT 185 197 182 175 196    Cardiac Enzymes: No results for input(s): "CKTOTAL", "CKMB", "CKMBINDEX", "TROPONINI" in the last 168 hours.  BNP: Invalid input(s): "POCBNP"  CBG: Recent Labs  Lab 01/21/22 0503 01/21/22 1144 01/21/22 1700 01/21/22 2305 01/22/22 0614  GLUCAP 116* 114* 130* 97 93    Microbiology: Results for orders placed or performed during the hospital encounter of 01/03/22  Urine Culture     Status: None   Collection Time: 01/05/22  8:56 AM   Specimen: Urine, Suprapubic  Result Value Ref Range Status   Specimen Description   Final    URINE, SUPRAPUBIC Performed at [facility], [address], [place], [place] [address]    Special Requests   Final    NONE Performed at [facility], [address] [place], [place] [address]    Culture   Final    NO GROWTH  Performed at [facility], [place] [address] [place], [place] [address]    Report Status 01/06/2022 FINAL  Final    Coagulation Studies: No results for input(s): "LABPROT", "INR" in the last 72 hours.  Urinalysis: No results for input(s): "COLORURINE", "LABSPEC", "PHURINE", "GLUCOSEU", "HGBUR", "BILIRUBINUR", "KETONESUR", "PROTEINUR", "UROBILINOGEN", "NITRITE", "LEUKOCYTESUR" in the last 72 hours.  Invalid input(s): "APPERANCEUR"    Imaging: DG Abd Portable 1V  Result Date: 01/21/2022 CLINICAL DATA:  Nasogastric tube placement. EXAM: PORTABLE ABDOMEN - 1 VIEW COMPARISON:  CT 01/15/2022. Radiographs 01/20/2022, 01/14/2022 and 01/06/2022. FINDINGS: 1429 hours. Single semi erect view of the abdomen centered at the gastroesophageal junction demonstrates an enteric tube projecting over the left upper quadrant of  the abdomen. The side-hole is below the GE junction. Diffuse bowel distension again noted, mildly increased from yesterday. There is atelectasis at the right lung base. Abdominal skin staples are in place. IMPRESSION: 1. Enteric tube projects over the left upper quadrant of the abdomen, likely in the mid stomach. 2. Mildly increased bowel distension, still likely reflecting a postoperative ileus. Electronically Signed   By: Richardean Sale M.D.   On: 01/21/2022 15:12   DG Abd 2 Views  Result Date: 01/20/2022 CLINICAL DATA:  [id number] Ileus following gastrointestinal surgery ([place]) [id number] EXAM: ABDOMEN - 2 VIEW COMPARISON:  Radiograph 01/15/2022 FINDINGS: Nasogastric tube tip and side port overlie the stomach. There is persistent diffuse gaseous distension of bowel in the abdomen, not significantly changed from prior. Suprapubic catheter overlies the pelvis. Midline skin staples. IMPRESSION: Persistent diffuse gaseous distention of bowel, not significantly changed from prior, compatible with ileus. Electronically Signed   By: Maurine Simmering M.D.   On: 01/20/2022 08:44     Medications:    sodium chloride     TPN ADULT (ION) 80 mL/hr at 01/22/22 0630    Chlorhexidine Gluconate Cloth  6 each Topical Daily   furosemide  20 mg Intravenous BID   influenza vaccine adjuvanted  0.5 mL Intramuscular Tomorrow-1000   pneumococcal 20-valent conjugate vaccine  0.5 mL Intramuscular Tomorrow-1000   polyethylene glycol  17 g Oral BID   sodium chloride flush  10-40 mL Intracatheter Q12H   sodium chloride flush  3 mL Intravenous Q12H   sodium chloride, acetaminophen **OR** acetaminophen, haloperidol lactate, hydrALAZINE, iohexol, morphine injection, ondansetron (ZOFRAN) IV, sodium chloride flush, sodium chloride flush  Assessment/ Plan:  Mr. Troy Monts. is a 76 y.o.  male with medical problems of atonic bladder status post suprapubic catheter placement, chronic kidney disease, anemia, hypertension, gout    was  admitted on 01/03/2022 for Acute on chronic renal failure ([place]) [N17.9, N18.9] Gross hematuria [R31.0]   Acute Kidney Injury  Likely ATN due to concurrent illness, small bowel obstruction. Patient continues to have adequate urine output.  We will continue to follow patient's labs Patient creatinine is trending down  No acute need for dialysis at this time.  Patient will make a poor long-term dialysis patient.  We will continue to monitor.  Lab Results  Component Value Date   CREATININE 4.34 (H) 01/22/2022   CREATININE 4.85 (H) 01/20/2022   CREATININE 5.07 (H) 01/19/2022    Intake/Output Summary (Last 24 hours) at 01/22/2022 0747 Last data filed at 01/22/2022 0630 Gross per 24 hour  Intake 1907.64 ml  Output 2650 ml  Net -742.36 ml   Hypernatremia resolved . Patient sodium is now better  Chronic kidney disease stage V Baseline creatinine 3.52/GFR 17 from 09/28/2021 Underlying CKD likely secondary to  hypertensive nephropathy   Gross hematuria/Atonic bladder Evaluated by urologist on 01/03/2022.    Small bowel obstruction Status post exploratory laparotomy on 01/07/2022. Experienced wound dehiscence with repair in OR on 01/10/22. TPN infusing   LOS: 18 Troy Berry s Christan Ciccarelli 10/18/20237:47 AM

## 2022-01-22 NOTE — Progress Notes (Signed)
Palliative Care Progress Note, Assessment & Plan   [name].       Date: 01/22/2022 DOB: 12/14/1945  Age: 76 y.o. MRN#: [id number] Attending Physician: Troy Shiley, MD Primary Care Physician: [facility] [place] Date: 01/03/2022  Reason for Consultation/Follow-up: Establishing goals of care  Subjective: Patient is lying in bed in no apparent distress.  He does not acknowledge my resident but easily awakens to voice and light touch.  NG tube to right nare is draining bilious output to low continuous wall suction.  Patient's wife Troy Berry is at bedside.  HPI: 76 y.o. male  with past medical history of atonic bladder status post suprapubic catheter, CKD (stage IV), and secondary hypertension admitted on 01/03/2022 with complaints of supra pubic catheter dysfunction and abdominal distention.  Hospital course includes ex lap and reduction of internal hernia as well as additional ex lap for wound closure.  Patient has received PRBCs due to low hemoglobin, foley catheter remains, TPN has been initiated via PICC line, and NG tube replaced 10/17 for vomiting and increase distention. Patient is being followed by general surgery and nephrology.   PMT was consulted to discuss goals of care.  Summary of counseling/coordination of care: After reviewing the patient's chart and assessing the patient at bedside, I discussed goals of care and advanced care planning with patient's wife.  I highlighted our discussion from yesterday when he reviewed patient's CODE STATUS.  Patient's wife shares she is waiting on her daughters to make that decision.  She says her 1 daughter said that what ever is going to be he is going to be and that it is in God's hands.  I tried to clarify if this meant  allowing a natural death.  Continuing full code the patient's wife was unable to clarify.  She shares that her daughters Troy Berry and Troy Berry will help make these decisions for the patient. They are not present at bedside.   Therapeutic silence and active listening provided for patient to share her thoughts and emotions regarding current medical situation.  Emotional support provided. We discussed his agitation and attempts to get OOB. She shares his anger comes and goes but that she is no use to seeing him like this. She wants to see patient get better, stronger, and "make more sense". WE discussed hospital delirium, dementia as well as functional, nutritional, and cognitive status as prognostic indicators.   Time for outcomes needed - NG has been replaced. Ongoing Axis discussions will continue.   PMT will continue to follow the patient throughout his hospitalization.   Code Status: Full code  Prognosis: Unable to determine  Discharge Planning: To Be Determined  Physical Exam Vitals reviewed.  Constitutional:      General: He is not in acute distress.    Appearance: He is not toxic-appearing.  HENT:     Head: Normocephalic.     Mouth/Throat:     Mouth: Mucous membranes are moist.  Eyes:     Pupils: Pupils are equal, round, and reactive to light.  Cardiovascular:     Rate and Rhythm: Normal rate.     Pulses: Normal pulses.  Pulmonary:     Effort:  Pulmonary effort is normal.  Abdominal:     Palpations: Abdomen is soft.  Musculoskeletal:     Comments: Generalized weakness  Skin:    General: Skin is warm and dry.             Palliative Assessment/Data: 40%    Total Time 50 minutes  Greater than 50%  of this time was spent counseling and coordinating care related to the above assessment and plan.  Thank you for allowing the Palliative Medicine Team to assist in the care of this patient.  Troy Ilsa Iha, FNP-BC Palliative Medicine Team Team Phone # [phone]

## 2022-01-22 NOTE — Consult Note (Signed)
PHARMACY - TOTAL PARENTERAL NUTRITION CONSULT NOTE   Indication: Prolonged ileus  Patient Measurements: Height: '5\' 9"'$  (175.3 cm) Weight: 64.7 kg (142 lb 10.2 oz) IBW/kg (Calculated) : 70.7 TPN AdjBW (KG): 73 Body mass index is 21.06 kg/m.   Assessment: 76 y.o. male with medical history significant of atonic bladder s/p suprapubic catheter, CKD Stage 4, anemia of chronic kidney disease, hypertension, gout found with small bowel obstruction 7 Day Post-Op s/p reduction of internal hernia.  This was complicated by midline wound dehiscence status post wound closure postop day #8.    Glucose / Insulin: currrently controlled, 0 units SSI used in the last 24 hrs Electrolytes: K 5.0  Phos 2.7   Mag 2.6  Renal: Scr 4.85 (baseline 2.5-3, per nephrology no HD needed), BUN 70>>91 Hepatic: LFTs WNL Intake / Output; MIVF: +1L GI Imaging:  -CT 10/11: Persistent small bowel dilatation with fluid-filled loops.  -CT 10/9:  findings favor ileus versus a partial small bowel obstruction, GI Surgeries / Procedures:  -Ex Lap 10/6: closure of opened abdominal incision wound -Ex Lap 10/3: reduction of internal hernia   Central access: DL PICC placed 10/11 TPN start date: 10/11  Nutritional Goals: Goal TPN rate is 75 mL/hr (provides 104.4 g of protein and 2197 kcals per day)  RD Assessment: Estimated Needs Total Energy Estimated Needs: 1800-2200kcal/day Total Protein Estimated Needs: 90-105g/day Total Fluid Estimated Needs: 1.7-2.0L/day  Current Nutrition:  NPO  10/14: did not tolerate NGT clamp trial  Plan:  -- adjust TPN to 75 ml/hr --nutritional components Amino acids (using 15% Clinisol) 104.4 grams Dextrose: 18.7% / 329.4 grams Lipids: 66 grams kCal: 2197 --Electrolytes in TPN: Na 71mq/L, Will decrease K from 40 to 30 mEq/L on 10/16, Ca 586m/L, Will decrease Mg from 5 to 3 mEq/L on 10/15 and Increase Phos from 0 to 5 mmol/L on 10/14. Cl:Ac 1:1 -on lasix '20mg'$  IV bid --Add standard MVI  and trace elements to TPN, remove chromium d/t renal insuffciency 10/15 -Thiamine '100mg'$  x3 days completed --stop SSI  --Monitor TPN labs on Mon/Thurs, and as needed until stable  RoDallie Piles0/18/2023,7:07 AM

## 2022-01-22 NOTE — Progress Notes (Signed)
PROGRESS NOTE    Troy Berry.  WJX:[id number] DOB: 07/30/1945 DOA: 01/03/2022 PCP: [facility]   Brief Narrative: 76 year old with past medical history significant for atonic bladder status post suprapubic catheter, stage IV CKD, hypertension presents to the ED on 9/30 with complaint of suprapubic catheter dysfunction as well as abdominal distention.  Patient was found to have possible SBO versus ileus.  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  General surgery and urology were consulted.  Patient was a started on cefepime to cover for UTI.  NG tube placed for abdominal distention.  Patient did not improve with conservative management and underwent exploratory laparoscopy and reduction of internal hernia 10/3.  Patient was noted to have acute onset of M-End and serous drainage through day 1, wound dehiscence and omental exposure.  On 10/6 patient was taken to the OR for wound closure.  He was noted to have distended bladder despite suprapubic catheter.  Bloody urine was aspirated.  There is no evidence of bowel obstruction.  On 7/11 decision was made to start patient on TPN.  NG tube has been clamped, he had a bowel movement.  Started on clear diet.     Assessment & Plan:   Principal Problem:   Acute renal failure superimposed on stage 4 chronic kidney disease (HCC) Active Problems:   SBO (small bowel obstruction) (HCC)   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HCC)   Anemia   Hematuria   Urinary tract infection   Metabolic acidosis   Essential hypertension   Gross hematuria   Dementia without behavioral disturbance (HCC)   Hypokalemia   Acute blood loss anemia   Protein-calorie malnutrition, severe   Wound dehiscence   1-SBO Status post reduction of ventral hernia complicated by midline wound  dehiscence he is status post wound closure. CT shows some evidence of bowel dilation with no focal point demonstrated..  CT cystogram demonstrated persistent small bowel dilation with evidence of focal transition in the central mesentery.  Patient did not improve with conservative management.  Underwent lap and reduction of hernia 10/3. TPN due to persistent ileus. NG tube has been clamped.  Started on clear diet Discontinue morphine due to CKD. Low dose dilaudid PRN order   2-AKI on CKD stage IV Creatinine peaked to 4.8 compared to prior 3.5 Component of obstructive uropathy and suprapubic catheter dysfunction.  Urology replace suprapubic catheter. Also component of ATN.  Nephrology following, patient not felt to be a good long term candidate for dialysis.  Continue to have good urine output.  Creatinine trending down Monitor on IV lasix.    3-suprapubic catheter dysfunction: History of atonic bladder requiring suprapubic catheter placed approximately 1 month ago 80/31/2023. Presented with hematuria and poor urinary drainage. Evaluated by urology 9/27, it was discovered that suprapubic catheter tip was not in the bladder but adjacent to the small bowel.  Urology consulted and suprapubic catheter replaced with cystogram demonstrating appropriate placement within the bladder.  Anemia, acute blood loss anemia postsurgery, hematuria and combination of anemia of chronic disease Patient has had multiple blood transfusion total of 3 Plan to give IV iron Monitor hemoglobin  Hematuria secondary to suprapubic catheter dysfunction  Urinary tract infection secondary to obstructive uropathy.  Completed course of IV antibiotics Hyponatremia: Resolved  Metabolic acidosis in the setting of CKD  Hypertension: Continue amlodipine carvedilol  Dementia : At  risk for delirium.  He was agitated last night received Haldol.  Sleep this a.m.  Wound dehiscence Following exploratory laparotomy, wound  dehisced requiring patient to go back to the OR on 10/6.  Surgery following   Protein-calorie malnutrition, severe Nutrition Status: Nutrition Problem: Severe Malnutrition Etiology: chronic illness (dementia, CKD, internal hernia) Signs/Symptoms: severe fat depletion, severe muscle depletion on TPN See nurse documentation.   Nutrition Problem: Severe Malnutrition Etiology: chronic illness (dementia, CKD, internal hernia)    Signs/Symptoms: severe fat depletion, severe muscle depletion       Estimated body mass index is 21.06 kg/m as calculated from the following:   Height as of this encounter: '5\' 9"'$  (1.753 m).   Weight as of this encounter: 64.7 kg.   DVT prophylaxis: SCD Code Status: Full code Family Communication: Wife at bedside.  Disposition Plan:  Status is: Inpatient Remains inpatient appropriate because: management of SBP post op complications.     Consultants:  Surgery Urology Nephrology  Palliative   Procedures:  Status post reduction of ventral hernia complicated by midline wound dehiscence he is status post wound closure.  Antimicrobials:    Subjective: He is sleepy. Received haldol last night.  He denies abdominal pain.  Per notes had BM last night   Objective: Vitals:   01/22/22 0111 01/22/22 0616 01/22/22 0746 01/22/22 1323  BP: (!) 160/57 121/65 (!) 158/48 130/68  Pulse: 68 75 65 71  Resp: '20 18 20 18  '$ Temp: 98.1 F (36.7 C) 98.6 F (37 C) 99.1 F (37.3 C) 98.3 F (36.8 C)  TempSrc: Oral Oral Oral Oral  SpO2: 100% 100% 100% 100%  Weight:      Height:        Intake/Output Summary (Last 24 hours) at 01/22/2022 1549 Last data filed at 01/22/2022 1400 Gross per 24 hour  Intake 2248.97 ml  Output 3250 ml  Net -1001.03 ml   Filed Weights   01/18/22 0405 01/21/22 0419 01/21/22 1134  Weight: 66.4 kg 62.2 kg 64.7 kg    Examination:  General exam: Appears calm and comfortable  Respiratory system: Clear to auscultation.  Respiratory effort normal. Cardiovascular system: S1 & S2 heard, RRR. No JVD, murmurs, rubs, gallops or clicks. No pedal edema. Gastrointestinal system: Abdomen is nondistended, soft, mild line with staples.  Central nervous system: sleepy  Extremities: Sno edema  Data Reviewed: I have personally reviewed following labs and imaging studies  CBC: Recent Labs  Lab 01/16/22 0403 01/17/22 0615 01/19/22 0528 01/20/22 0800 01/22/22 0328  WBC 9.8 9.1 8.1 6.6 8.1  NEUTROABS  --   --  5.7 4.8  --   HGB 6.6* 7.5* 7.0* 6.3* 7.5*  HCT 21.1* 23.3* 22.0* 19.9* 23.4*  MCV 84.4 83.8 84.3 85.4 86.0  PLT 185 197 182 175 606   Basic Metabolic Panel: Recent Labs  Lab 01/16/22 0403 01/17/22 0615 01/18/22 0352 01/19/22 0528 01/20/22 0800 01/22/22 0328  NA 135  --  137 139 139 138  K 3.7  --  4.6 4.7 5.0 4.6  CL 97*  --  103 103 105 106  CO2 29  --  '29 29 28 25  '$ GLUCOSE 117*  --  113* 115* 116* 106*  BUN 70*  --  91* 99* 100* 115*  CREATININE 4.91*  --  5.03* 5.07* 4.85* 4.34*  CALCIUM 9.1  --  9.5 9.9 9.7 9.9  MG 2.3 2.4 2.5* 2.6* 2.4  --   PHOS 4.7* 3.1 2.2* 2.7 2.7  --  GFR: Estimated Creatinine Clearance: 13 mL/min (A) (by C-G formula based on SCr of 4.34 mg/dL (H)). Liver Function Tests: Recent Labs  Lab 01/16/22 0403 01/20/22 0800  AST 16 15  ALT 7 6  ALKPHOS 41 63  BILITOT 0.5 0.5  PROT 4.8* 5.2*  ALBUMIN 2.1* 2.2*   No results for input(s): "LIPASE", "AMYLASE" in the last 168 hours. No results for input(s): "AMMONIA" in the last 168 hours. Coagulation Profile: No results for input(s): "INR", "PROTIME" in the last 168 hours. Cardiac Enzymes: No results for input(s): "CKTOTAL", "CKMB", "CKMBINDEX", "TROPONINI" in the last 168 hours. BNP (last 3 results) No results for input(s): "PROBNP" in the last 8760 hours. HbA1C: No results for input(s): "HGBA1C" in the last 72 hours. CBG: Recent Labs  Lab 01/21/22 0503 01/21/22 1144 01/21/22 1700 01/21/22 2305  01/22/22 0614  GLUCAP 116* 114* 130* 97 93   Lipid Profile: Recent Labs    01/20/22 0800  TRIG 71   Thyroid Function Tests: No results for input(s): "TSH", "T4TOTAL", "FREET4", "T3FREE", "THYROIDAB" in the last 72 hours. Anemia Panel: No results for input(s): "VITAMINB12", "FOLATE", "FERRITIN", "TIBC", "IRON", "RETICCTPCT" in the last 72 hours. Sepsis Labs: No results for input(s): "PROCALCITON", "LATICACIDVEN" in the last 168 hours.  No results found for this or any previous visit (from the past 240 hour(s)).       Radiology Studies: DG Abd 1 View  Result Date: 01/22/2022 CLINICAL DATA:  Abdominal distension. EXAM: ABDOMEN - 1 VIEW COMPARISON:  January 21, 2022. FINDINGS: The bowel gas pattern is normal. Nasogastric tube tip is seen in stomach. Midline surgical staples are noted. Surgical drain or suprapubic urinary catheter is noted in the pelvis. IMPRESSION: No abnormal bowel dilatation. Electronically Signed   By: Marijo Conception M.D.   On: 01/22/2022 08:27   DG Abd Portable 1V  Result Date: 01/21/2022 CLINICAL DATA:  Nasogastric tube placement. EXAM: PORTABLE ABDOMEN - 1 VIEW COMPARISON:  CT 01/15/2022. Radiographs 01/20/2022, 01/14/2022 and 01/06/2022. FINDINGS: 1429 hours. Single semi erect view of the abdomen centered at the gastroesophageal junction demonstrates an enteric tube projecting over the left upper quadrant of the abdomen. The side-hole is below the GE junction. Diffuse bowel distension again noted, mildly increased from yesterday. There is atelectasis at the right lung base. Abdominal skin staples are in place. IMPRESSION: 1. Enteric tube projects over the left upper quadrant of the abdomen, likely in the mid stomach. 2. Mildly increased bowel distension, still likely reflecting a postoperative ileus. Electronically Signed   By: Richardean Sale M.D.   On: 01/21/2022 15:12        Scheduled Meds:  Chlorhexidine Gluconate Cloth  6 each Topical Daily    furosemide  20 mg Intravenous BID   influenza vaccine adjuvanted  0.5 mL Intramuscular Tomorrow-1000   pneumococcal 20-valent conjugate vaccine  0.5 mL Intramuscular Tomorrow-1000   polyethylene glycol  17 g Oral BID   sodium chloride flush  10-40 mL Intracatheter Q12H   sodium chloride flush  3 mL Intravenous Q12H   Continuous Infusions:  sodium chloride     ferric gluconate (FERRLECIT) IVPB     TPN ADULT (ION) 80 mL/hr at 01/22/22 1046   TPN ADULT (ION)       LOS: 18 days    Time spent: 35 minutes.     Elmarie Shiley, MD [facility]   If 7PM-7AM, please contact night-coverage [email or web address]  01/22/2022, 3:49 PM

## 2022-01-22 NOTE — Progress Notes (Signed)
Patient ID: Troy Beals., male   DOB: 18-Aug-1945, 76 y.o.   MRN: [id number]     [place] [facility] Day(s): 18.   Interval History: Patient seen and examined. As per chart, bowel movement last night. Today abdomen is soft.   Vital signs in last 24 hours: [min-max] current  Temp:  [98.1 F (36.7 C)-99.1 F (37.3 C)] 99.1 F (37.3 C) (10/18 0746) Pulse Rate:  [65-99] 65 (10/18 0746) Resp:  [18-20] 20 (10/18 0746) BP: (121-174)/(48-65) 158/48 (10/18 0746) SpO2:  [89 %-100 %] 100 % (10/18 0746) Weight:  [64.7 kg] 64.7 kg (10/17 1134)     Height: '5\' 9"'$  (175.3 cm) Weight: 64.7 kg BMI (Calculated): 21.05   Physical Exam:  Constitutional: alert, cooperative and no distress  Respiratory: breathing non-labored at rest  Cardiovascular: regular rate and sinus rhythm  Gastrointestinal: soft, non-tender, and non-distended  Labs:     Latest Ref Rng & Units 01/22/2022    3:28 AM 01/20/2022    8:00 AM 01/19/2022    5:28 AM  CBC  WBC 4.0 - 10.5 K/uL 8.1  6.6  8.1   Hemoglobin 13.0 - 17.0 g/dL 7.5  6.3  7.0   Hematocrit 39.0 - 52.0 % 23.4  19.9  22.0   Platelets 150 - 400 K/uL 196  175  182       Latest Ref Rng & Units 01/22/2022    3:28 AM 01/20/2022    8:00 AM 01/19/2022    5:28 AM  CMP  Glucose 70 - 99 mg/dL 106  116  115   BUN 8 - 23 mg/dL 115  100  99   Creatinine 0.61 - 1.24 mg/dL 4.34  4.85  5.07   Sodium 135 - 145 mmol/L 138  139  139   Potassium 3.5 - 5.1 mmol/L 4.6  5.0  4.7   Chloride 98 - 111 mmol/L 106  105  103   CO2 22 - 32 mmol/L '25  28  29   '$ Calcium 8.9 - 10.3 mg/dL 9.9  9.7  9.9   Total Protein 6.5 - 8.1 g/dL  5.2    Total Bilirubin 0.3 - 1.2 mg/dL  0.5    Alkaline Phos 38 - 126 U/L  63    AST 15 - 41 U/L  15    ALT 0 - 44 U/L  6      Imaging studies: No new pertinent imaging studies   Assessment/Plan:  76 y.o. male with small bowel obstruction 15 Day Post-Op s/p reduction of internal hernia.  The was complicated by midline wound  dehiscence status post wound closure postop day #12.     Prolonged postoperative ileus  -Abdomen is soft again and had a good bowel movement.  -Will clamp NGT again and restart clear liquid trial.  -Will follow closely.   Arnold Long, MD

## 2022-01-22 NOTE — Progress Notes (Signed)
Occupational Therapy Treatment Patient Details Name: Troy Berry. MRN: [id number] DOB: Jul 12, 1945 Today's Date: 01/22/2022   History of present illness Patient is a 76 y.o.  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 He was admitted on 01/03/2022 for Acute on chronic renal failure, small bowel obstruction  s/p reduction of internal hernia. patient with midline wound dehiscence status post wound closure on 10/6.   OT comments  Upon entering session, pt resting in bed and agreeable to OT. Wife present throughout. Tx session targeted improving functional balance during ADL tasks in preparation for improved functional transfers and ADL task completion. Pt demonstrated improvements in alertness and sitting EOB tolerance this date. Pt required Max A for supine<>sit, Mod A for grooming tasks, and Max A to maintain static sitting balance. Pt continues to require max multimodal cues for initiation, sequencing, and command following. Pt left as received with all needs in reach. Mitts donned, sitter present. Pt is making progress toward goal completion. D/C recommendation remains appropriate. OT will continue to follow acutely.      Recommendations for follow up therapy are one component of a multi-disciplinary discharge planning process, led by the attending physician.  Recommendations may be updated based on patient status, additional functional criteria and insurance authorization.    Follow Up Recommendations  Skilled nursing-short term rehab (<3 hours/day)    Assistance Recommended at Discharge Frequent or constant Supervision/Assistance  Patient can return home with the following  A lot of help with walking and/or transfers;A lot of help with bathing/dressing/bathroom;Assistance with feeding;Assistance with cooking/housework;Direct supervision/assist for medications management;Direct  supervision/assist for financial management;Assist for transportation   Equipment Recommendations  Other (comment) (defer to next venue of care)    Recommendations for Other Services      Precautions / Restrictions Precautions Precautions: Fall Precaution Comments: suprapubic catheter; abdominal incision Restrictions Weight Bearing Restrictions: No       Mobility Bed Mobility Overal bed mobility: Needs Assistance Bed Mobility: Supine to Sit, Sit to Supine     Supine to sit: Max assist Sit to supine: Max assist   General bed mobility comments: Supine<>sit completed 2x. Pt verbally agreeing to sit EOB with therapist, however, unable to initiate. Required Max A to sit at EOB and scoot hips forward, once positioned properly pt abruptly leaning heavy to R side and throwing BLEs back on bed to lay down. Attempted sitting at EOB a second time with Max A and tolerated long enough to wash his face. Max A +2 to scoot pt up in bed.    Transfers                   General transfer comment: pt did not sit long enough to attempt standing     Balance Overall balance assessment: Needs assistance Sitting-balance support: Bilateral upper extremity supported, Feet supported Sitting balance-[name]: Poor Sitting balance - Comments: Max A to maintain static sitting balance, pt with heavy R lateral lean, attempting to lay back down depsite Max multimodal cues Postural control: Right lateral lean                                 ADL either performed or assessed with clinical judgement   ADL Overall ADL's : Needs assistance/impaired     Grooming: Wash/dry face;Sitting;Moderate assistance  Extremity/Trunk Assessment Upper Extremity Assessment Upper Extremity Assessment: Generalized weakness;Difficult to assess due to impaired cognition   Lower Extremity Assessment Lower Extremity Assessment: Generalized  weakness;Difficult to assess due to impaired cognition        Vision Patient Visual Report: No change from baseline     Perception     Praxis      Cognition Arousal/Alertness: Awake/alert Behavior During Therapy: Flat affect Overall Cognitive Status: History of cognitive impairments - at baseline Area of Impairment: Following commands, Problem solving, Attention, Safety/judgement, Orientation                 Orientation Level: Disoriented to, Place, Time, Situation, Person Current Attention Level: Selective   Following Commands: Follows one step commands inconsistently Safety/Judgement: Decreased awareness of safety, Decreased awareness of deficits   Problem Solving: Slow processing, Decreased initiation, Difficulty sequencing, Requires verbal cues, Requires tactile cues General Comments: 1:1 sitter and B mitts in place for safety        Exercises      Shoulder Instructions       General Comments      Pertinent Vitals/ Pain       Pain Assessment Pain Assessment: Faces Faces Pain Scale: Hurts a little bit Pain Location: abdomen Pain Descriptors / Indicators: Operative site guarding Pain Intervention(s): Limited activity within patient's tolerance, Monitored during session, Repositioned  Home Living                                          Prior Functioning/Environment              Frequency  Min 2X/week        Progress Toward Goals  OT Goals(current goals can now be found in the care plan section)  Progress towards OT goals: Progressing toward goals  Acute Rehab OT Goals Patient Stated Goal: to get stronger OT Goal Formulation: With family Time For Goal Achievement: 01/24/22 Potential to Achieve Goals: [place] Discharge plan remains appropriate;Frequency remains appropriate    Co-evaluation                 AM-PAC OT "6 Clicks" Daily Activity     Outcome Measure   Help from another person eating meals?: A  Little Help from another person taking care of personal grooming?: A Lot Help from another person toileting, which includes using toliet, bedpan, or urinal?: A Lot Help from another person bathing (including washing, rinsing, drying)?: A Lot Help from another person to put on and taking off regular upper body clothing?: A Little Help from another person to put on and taking off regular lower body clothing?: A Lot 6 Click Score: 14    End of Session Equipment Utilized During Treatment: Other (comment) (none)  OT Visit Diagnosis: Unsteadiness on feet (R26.81);Repeated falls (R29.6);Muscle weakness (generalized) (M62.81)   Activity Tolerance Patient limited by fatigue   Patient Left in bed;with call bell/phone within reach;with bed alarm set;with nursing/sitter in room;with restraints reapplied (sitter present)   Nurse Communication Mobility status        Time: 4128-7867 OT Time Calculation (min): 17 min  Charges: OT General Charges $OT Visit: 1 Visit OT Treatments $Self Care/Home Management : 8-22 mins  [facility] MS, OTR/L ascom [phone]  01/22/22, 5:40 PM

## 2022-01-22 NOTE — Progress Notes (Signed)
Haldol given tonight r/t pt throwing legs over bed and trying to get up. Asking for water but unable to understand he is NPO. Cont to monitor

## 2022-01-23 DIAGNOSIS — E43 Unspecified severe protein-calorie malnutrition: Secondary | ICD-10-CM | POA: Diagnosis not present

## 2022-01-23 DIAGNOSIS — N184 Chronic kidney disease, stage 4 (severe): Secondary | ICD-10-CM | POA: Diagnosis not present

## 2022-01-23 DIAGNOSIS — N179 Acute kidney failure, unspecified: Secondary | ICD-10-CM | POA: Diagnosis not present

## 2022-01-23 DIAGNOSIS — F0390 Unspecified dementia without behavioral disturbance: Secondary | ICD-10-CM | POA: Diagnosis not present

## 2022-01-23 DIAGNOSIS — K560 Paralytic ileus: Secondary | ICD-10-CM | POA: Diagnosis not present

## 2022-01-23 LAB — COMPREHENSIVE METABOLIC PANEL
ALT: 12 U/L (ref 0–44)
AST: 20 U/L (ref 15–41)
Albumin: 2.3 g/dL — ABNORMAL LOW (ref 3.5–5.0)
Alkaline Phosphatase: 76 U/L (ref 38–126)
Anion gap: 8 (ref 5–15)
BUN: 113 mg/dL — ABNORMAL HIGH (ref 8–23)
CO2: 23 mmol/L (ref 22–32)
Calcium: 9.3 mg/dL (ref 8.9–10.3)
Chloride: 104 mmol/L (ref 98–111)
Creatinine, Ser: 3.97 mg/dL — ABNORMAL HIGH (ref 0.61–1.24)
GFR, Estimated: 15 mL/min — ABNORMAL LOW (ref >60)
Glucose, Bld: 102 mg/dL — ABNORMAL HIGH (ref 70–99)
Potassium: 4.3 mmol/L (ref 3.5–5.1)
Sodium: 135 mmol/L (ref 135–145)
Total Bilirubin: 0.6 mg/dL (ref 0.3–1.2)
Total Protein: 5.3 g/dL — ABNORMAL LOW (ref 6.5–8.1)

## 2022-01-23 LAB — CBC
HCT: 22.8 % — ABNORMAL LOW (ref 39.0–52.0)
Hemoglobin: 7.2 g/dL — ABNORMAL LOW (ref 13.0–17.0)
MCH: 27.2 pg (ref 26.0–34.0)
MCHC: 31.6 g/dL (ref 30.0–36.0)
MCV: 86 fL (ref 80.0–100.0)
Platelets: 183 10*3/uL (ref 150–400)
RBC: 2.65 MIL/uL — ABNORMAL LOW (ref 4.22–5.81)
RDW: 15.8 % — ABNORMAL HIGH (ref 11.5–15.5)
WBC: 8.3 10*3/uL (ref 4.0–10.5)
nRBC: 0 % (ref 0.0–0.2)

## 2022-01-23 LAB — PHOSPHORUS: Phosphorus: 2.7 mg/dL (ref 2.5–4.6)

## 2022-01-23 LAB — TRIGLYCERIDES: Triglycerides: 69 mg/dL (ref <150)

## 2022-01-23 LAB — MAGNESIUM: Magnesium: 2.1 mg/dL (ref 1.7–2.4)

## 2022-01-23 MED ORDER — TRACE MINERALS CU-MN-SE-ZN 300-55-60-3000 MCG/ML IV SOLN
INTRAVENOUS | Status: AC
  Filled 2022-01-23: qty 696

## 2022-01-23 NOTE — Progress Notes (Signed)
Physical Therapy Treatment Patient Details Name: Troy Berry. MRN: [id number] DOB: 08/11/1945 Today's Date: 01/23/2022   History of Present Illness Patient is a 76 y.o.  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 He was admitted on 01/03/2022 for Acute on chronic renal failure, small bowel obstruction  s/p reduction of internal hernia. patient with midline wound dehiscence status post wound closure on 10/6.    PT Comments    Pt resting in bed upon PT arrival and appearing more alert today; pt inconsistent with following 1 step cues but improved compared to previous sessions.  Max assist with bed mobility; close SBA with sitting balance; mod to max assist x2 to stand up (mostly 1/2 to 3/4th stands) x7 trials (from elevated bed height) with B hand hold assist (see below for details); and max assist x2 to sidestep to R a couple small steps with B hand hold assist.  Will continue to focus on strengthening, balance, and progressive functional mobility during hospitalization.   Recommendations for follow up therapy are one component of a multi-disciplinary discharge planning process, led by the attending physician.  Recommendations may be updated based on patient status, additional functional criteria and insurance authorization.  Follow Up Recommendations  Skilled nursing-short term rehab (<3 hours/day) Can patient physically be transported by private vehicle: No   Assistance Recommended at Discharge Frequent or constant Supervision/Assistance  Patient can return home with the following Two people to help with walking and/or transfers;A lot of help with bathing/dressing/bathroom;Assist for transportation;Help with stairs or ramp for entrance;Direct supervision/assist for medications management;Assistance with feeding;Assistance with cooking/housework;Direct supervision/assist for financial  management   Equipment Recommendations  Other (comment) (TBD at next facility)    Recommendations for Other Services       Precautions / Restrictions Precautions Precautions: Fall Precaution Comments: suprapubic catheter; abdominal incision; NG tube Restrictions Weight Bearing Restrictions: No     Mobility  Bed Mobility Overal bed mobility: Needs Assistance Bed Mobility: Supine to Sit, Sit to Supine Rolling: Mod assist (to change sheets d/t small bowel incontinence)   Supine to sit: Max assist Sit to supine: Max assist   General bed mobility comments: assist for trunk and B LE's; vc's for technique; assist to scoot towards edge of bed using bed sheet    Transfers Overall transfer level: Needs assistance Equipment used: 2 person hand held assist Transfers: Sit to/from Stand Sit to Stand: Mod assist, Max assist, +2 physical assistance, From elevated surface           General transfer comment: x7 trials standing; pt able to 1/2 stand 1st, 2nd, and 7th trial; able to come to 3/4th stand on 3rd-4th and 6th trial; able to come to almost upright stand on 5th trial; vc's for technique and R knee blocked    Ambulation/Gait Ambulation/Gait assistance: Max assist, +2 physical assistance Gait Distance (Feet):  (pt took 2 small sidesteps to R along bed) Assistive device: 2 person hand held assist   Gait velocity: decreased     General Gait Details: increased effort to take small steps; assist to weight shift; vc's for technique   Stairs             Wheelchair Mobility    Modified Rankin (Stroke Patients Only)       Balance Overall balance assessment: Needs assistance Sitting-balance support: Bilateral upper extremity supported, Feet supported Sitting balance-[name]: Fair Sitting balance - Comments: steady static sitting (although  pt attempting to lay down on own at times requiring cueing to stay sitting upright)   Standing balance support: Bilateral  upper extremity supported Standing balance-[name]: Poor Standing balance comment: 2 assist for static standing balance with B UE support                            Cognition Arousal/Alertness: Awake/alert Behavior During Therapy: Flat affect Overall Cognitive Status: History of cognitive impairments - at baseline Area of Impairment: Following commands, Problem solving, Attention, Safety/judgement, Orientation                 Orientation Level: Disoriented to, Place, Time, Situation, Person (pt knew his name and year of birth (not month or day)) Current Attention Level: Selective   Following Commands: Follows one step commands inconsistently Safety/Judgement: Decreased awareness of safety, Decreased awareness of deficits   Problem Solving: Slow processing, Decreased initiation, Difficulty sequencing, Requires verbal cues, Requires tactile cues General Comments: 1:1 sitter and B mitts in place for safety        Exercises      General Comments General comments (skin integrity, edema, etc.): no drainage noted abdominal incision.  Nursing cleared pt for participation in physical therapy.  Pt agreeable to PT session.  Pt's wife present during session.      Pertinent Vitals/Pain Pain Assessment Pain Assessment: Faces Faces Pain Scale: Hurts a little bit Pain Location: abdomen Pain Descriptors / Indicators: Operative site guarding Pain Intervention(s): Limited activity within patient's tolerance, Monitored during session, Repositioned    Home Living                          Prior Function            PT Goals (current goals can now be found in the care plan section) Acute Rehab PT Goals Patient Stated Goal: to improve strength and mobility PT Goal Formulation: With patient/family Time For Goal Achievement: 01/24/22 Potential to Achieve Goals: Fair Progress towards PT goals: Progressing toward goals    Frequency    Min 2X/week       PT Plan Current plan remains appropriate    Co-evaluation              AM-PAC PT "6 Clicks" Mobility   Outcome Measure  Help needed turning from your back to your side while in a flat bed without using bedrails?: A Lot Help needed moving from lying on your back to sitting on the side of a flat bed without using bedrails?: A Lot Help needed moving to and from a bed to a chair (including a wheelchair)?: Total Help needed standing up from a chair using your arms (e.g., wheelchair or bedside chair)?: Total Help needed to walk in hospital room?: Total Help needed climbing 3-5 steps with a railing? : Total 6 Click Score: 8    End of Session Equipment Utilized During Treatment: Gait belt (up high away from incision) Activity Tolerance: Patient tolerated treatment well Patient left: in bed;with call bell/phone within reach;with bed alarm set;with nursing/sitter in room;with family/visitor present;Other (comment) (B heels floating via pillow support) Nurse Communication: Mobility status;Precautions PT Visit Diagnosis: Unsteadiness on feet (R26.81);Muscle weakness (generalized) (M62.81)     Time: 3335-4562 PT Time Calculation (min) (ACUTE ONLY): 34 min  Charges:  $Therapeutic Activity: 23-37 mins  Leitha Bleak, PT 01/23/22, 4:07 PM

## 2022-01-23 NOTE — Progress Notes (Signed)
PROGRESS NOTE    Troy Berry.  PYP:[id number] DOB: 02-Aug-1945 DOA: 01/03/2022 PCP: [facility] [place]   Brief Narrative: 76 year old with past medical history significant for atonic bladder status post suprapubic catheter, stage IV CKD, hypertension presents to the ED on 9/30 with complaint of suprapubic catheter dysfunction as well as abdominal distention.  Patient was found to have possible SBO versus ileus.  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  General surgery and urology were consulted.  Patient was a started on cefepime to cover for UTI.  NG tube placed for abdominal distention.  Patient did not improve with conservative management and underwent exploratory laparoscopy and reduction of internal hernia 10/3.  Patient was noted to have acute onset of M-End and serous drainage through day 1, wound dehiscence and omental exposure.  On 10/6 patient was taken to the OR for wound closure.  He was noted to have distended bladder despite suprapubic catheter.  Bloody urine was aspirated.  There is no evidence of bowel obstruction.  On 7/11 decision was made to start patient on TPN.  NG tube has been clamped, he had a bowel movement.  Started on clear diet.     Assessment & Plan:   Principal Problem:   Acute renal failure superimposed on stage 4 chronic kidney disease (HCC) Active Problems:   SBO (small bowel obstruction) (HCC)   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HCC)   Anemia   Hematuria   Urinary tract infection   Metabolic acidosis   Essential hypertension   Gross hematuria   Dementia without behavioral disturbance (HCC)   Hypokalemia   Acute blood loss anemia   Protein-calorie malnutrition, severe   Wound dehiscence   1-SBO -Status post reduction of ventral hernia complicated by midline wound  dehiscence he is status post wound closure. -CT shows some evidence of bowel dilation with no focal point demonstrated..  CT cystogram demonstrated persistent small bowel dilation with evidence of focal transition in the central mesentery.  Patient did not improve with conservative management.  Underwent lap and reduction of hernia 10/3. -TPN due to persistent ileus. -NG tube has been clamped.  Started on clear diet 10/18 Discontinue morphine due to CKD. Low dose dilaudid PRN order  Plan to continue with current plan.   2-AKI on CKD stage IV Creatinine peaked to 4.8 compared to prior 3.5 Component of obstructive uropathy and suprapubic catheter dysfunction.  Urology replace suprapubic catheter. Also component of ATN.  Nephrology following, patient not felt to be a good long term candidate for dialysis.  Continue to have good urine output.  Creatinine trending down Monitor on IV lasix.  Cr down to 3.9  3-suprapubic catheter dysfunction: History of atonic bladder requiring suprapubic catheter placed approximately 1 month ago 80/31/2023. Presented with hematuria and poor urinary drainage. Evaluated by urology 9/27, it was discovered that suprapubic catheter tip was not in the bladder but adjacent to the small bowel.  Urology consulted and suprapubic catheter replaced with cystogram demonstrating appropriate placement within the bladder. Hematuria persist. Monitor.   Anemia, acute blood loss anemia postsurgery, hematuria and combination of anemia of chronic disease Patient has had multiple blood transfusion total of 3 Received IV iron 10/18 Monitor hemoglobin  Hematuria secondary to suprapubic catheter dysfunction  Urinary tract infection secondary to obstructive uropathy.  Completed course of IV antibiotics Hyponatremia: Resolved  Metabolic acidosis  in the setting of CKD. resolved  Hypertension: Continue amlodipine carvedilol  Dementia : At risk for delirium.  He was agitated last night  received Haldol.  He is more alert today.  Delirium precaution.   Wound dehiscence Following exploratory laparotomy, wound dehisced requiring patient to go back to the OR on 10/6.  Surgery following   Protein-calorie malnutrition, severe Nutrition Status: Nutrition Problem: Severe Malnutrition Etiology: chronic illness (dementia, CKD, internal hernia) Signs/Symptoms: severe fat depletion, severe muscle depletion on TPN See nurse documentation.   Nutrition Problem: Severe Malnutrition Etiology: chronic illness (dementia, CKD, internal hernia)    Signs/Symptoms: severe fat depletion, severe muscle depletion       Estimated body mass index is 21.06 kg/m as calculated from the following:   Height as of this encounter: '5\' 9"'$  (1.753 m).   Weight as of this encounter: 64.7 kg.   DVT prophylaxis: SCD Code Status: Full code Family Communication: Wife at bedside.  Disposition Plan:  Status is: Inpatient Remains inpatient appropriate because: management of SBP post op complications.     Consultants:  Surgery Urology Nephrology  Palliative   Procedures:  Status post reduction of ventral hernia complicated by midline wound dehiscence he is status post wound closure.  Antimicrobials:    Subjective: He is more alert, appears calm. Not oriented. Denies pain.   Objective: Vitals:   01/22/22 1552 01/22/22 1939 01/23/22 0313 01/23/22 0716  BP: (!) 156/67 (!) 171/69 (!) 169/66 (!) 148/59  Pulse: 80 77 79 (!) 57  Resp: '17 20 19 17  '$ Temp: 97.9 F (36.6 C) 98.3 F (36.8 C) (!) 97.4 F (36.3 C) 97.9 F (36.6 C)  TempSrc:  Oral Axillary   SpO2: 96% 100% 99% 100%  Weight:      Height:        Intake/Output Summary (Last 24 hours) at 01/23/2022 1507 Last data filed at 01/23/2022 1100 Gross per 24 hour  Intake 2429.67 ml  Output 2500 ml  Net -70.33 ml    Filed Weights   01/18/22 0405 01/21/22 0419 01/21/22 1134  Weight: 66.4 kg 62.2 kg 64.7 kg     Examination:  General exam: NAD Respiratory system: CCTA Cardiovascular system: SS 1, S 2 RRR Gastrointestinal system: Abdomen is nondistended, soft, mild line with staples.  Central nervous system: alert, follows some command Extremities: no edema  Data Reviewed: I have personally reviewed following labs and imaging studies  CBC: Recent Labs  Lab 01/17/22 0615 01/19/22 0528 01/20/22 0800 01/22/22 0328 01/23/22 0402  WBC 9.1 8.1 6.6 8.1 8.3  NEUTROABS  --  5.7 4.8  --   --   HGB 7.5* 7.0* 6.3* 7.5* 7.2*  HCT 23.3* 22.0* 19.9* 23.4* 22.8*  MCV 83.8 84.3 85.4 86.0 86.0  PLT 197 182 175 196 703    Basic Metabolic Panel: Recent Labs  Lab 01/17/22 0615 01/18/22 0352 01/19/22 0528 01/20/22 0800 01/22/22 0328 01/23/22 0402  NA  --  137 139 139 138 135  K  --  4.6 4.7 5.0 4.6 4.3  CL  --  103 103 105 106 104  CO2  --  '29 29 28 25 23  '$ GLUCOSE  --  113* 115* 116* 106* 102*  BUN  --  91* 99* 100* 115* 113*  CREATININE  --  5.03* 5.07* 4.85* 4.34* 3.97*  CALCIUM  --  9.5 9.9 9.7 9.9 9.3  MG 2.4 2.5* 2.6* 2.4  --  2.1  PHOS 3.1 2.2* 2.7 2.7  --  2.7  GFR: Estimated Creatinine Clearance: 14.3 mL/min (A) (by C-G formula based on SCr of 3.97 mg/dL (H)). Liver Function Tests: Recent Labs  Lab 01/20/22 0800 01/23/22 0402  AST 15 20  ALT 6 12  ALKPHOS 63 76  BILITOT 0.5 0.6  PROT 5.2* 5.3*  ALBUMIN 2.2* 2.3*    No results for input(s): "LIPASE", "AMYLASE" in the last 168 hours. No results for input(s): "AMMONIA" in the last 168 hours. Coagulation Profile: No results for input(s): "INR", "PROTIME" in the last 168 hours. Cardiac Enzymes: No results for input(s): "CKTOTAL", "CKMB", "CKMBINDEX", "TROPONINI" in the last 168 hours. BNP (last 3 results) No results for input(s): "PROBNP" in the last 8760 hours. HbA1C: No results for input(s): "HGBA1C" in the last 72 hours. CBG: Recent Labs  Lab 01/21/22 0503 01/21/22 1144 01/21/22 1700 01/21/22 2305  01/22/22 0614  GLUCAP 116* 114* 130* 97 93    Lipid Profile: Recent Labs    01/23/22 0402  TRIG 69    Thyroid Function Tests: No results for input(s): "TSH", "T4TOTAL", "FREET4", "T3FREE", "THYROIDAB" in the last 72 hours. Anemia Panel: No results for input(s): "VITAMINB12", "FOLATE", "FERRITIN", "TIBC", "IRON", "RETICCTPCT" in the last 72 hours. Sepsis Labs: No results for input(s): "PROCALCITON", "LATICACIDVEN" in the last 168 hours.  No results found for this or any previous visit (from the past 240 hour(s)).       Radiology Studies: DG Abd 1 View  Result Date: 01/22/2022 CLINICAL DATA:  Abdominal distension. EXAM: ABDOMEN - 1 VIEW COMPARISON:  January 21, 2022. FINDINGS: The bowel gas pattern is normal. Nasogastric tube tip is seen in stomach. Midline surgical staples are noted. Surgical drain or suprapubic urinary catheter is noted in the pelvis. IMPRESSION: No abnormal bowel dilatation. Electronically Signed   By: Marijo Conception M.D.   On: 01/22/2022 08:27        Scheduled Meds:  Chlorhexidine Gluconate Cloth  6 each Topical Daily   furosemide  20 mg Intravenous BID   polyethylene glycol  17 g Oral BID   sodium chloride flush  10-40 mL Intracatheter Q12H   sodium chloride flush  3 mL Intravenous Q12H   Continuous Infusions:  sodium chloride Stopped (01/22/22 2041)   TPN ADULT (ION) 75 mL/hr at 01/23/22 0631   TPN ADULT (ION)       LOS: 19 days    Time spent: 35 minutes.     Elmarie Shiley, MD [facility]   If 7PM-7AM, please contact night-coverage [email or web address]  01/23/2022, 3:07 PM

## 2022-01-23 NOTE — Consult Note (Signed)
PHARMACY - TOTAL PARENTERAL NUTRITION CONSULT NOTE   Indication: Prolonged ileus  Patient Measurements: Height: '5\' 9"'$  (175.3 cm) Weight: 64.7 kg (142 lb 10.2 oz) IBW/kg (Calculated) : 70.7 TPN AdjBW (KG): 73 Body mass index is 21.06 kg/m.   Assessment: 76 y.o. male with medical history significant of atonic bladder s/p suprapubic catheter, CKD Stage 4, anemia of chronic kidney disease, hypertension, gout found with small bowel obstruction 7 Day Post-Op s/p reduction of internal hernia.  This was complicated by midline wound dehiscence status post wound closure postop    Glucose / Insulin: SSI stopped, not required Electrolytes: electrolytes wnl  Renal: Scr 4.85-->3.97 (baseline 2.5-3, per nephrology no HD needed), BUN 70>>91 Hepatic: LFTs WNL Intake / Output; MIVF: +1L GI Imaging:  -CT 10/11: Persistent small bowel dilatation with fluid-filled loops.  -CT 10/9:  findings favor ileus versus a partial small bowel obstruction, GI Surgeries / Procedures:  -Ex Lap 10/6: closure of opened abdominal incision wound -Ex Lap 10/3: reduction of internal hernia   Central access: DL PICC placed 10/11 TPN start date: 10/11  Nutritional Goals: Goal TPN rate is 75 mL/hr (provides 104.4 g of protein and 2197 kcals per day)  RD Assessment: Estimated Needs Total Energy Estimated Needs: 1800-2200kcal/day Total Protein Estimated Needs: 90-105g/day Total Fluid Estimated Needs: 1.7-2.0L/day  Current Nutrition:  NPO  10/14: did not tolerate NGT clamp trial  Plan:  -- continue TPN at 75 ml/hr --nutritional components Amino acids (using 15% Clinisol) 104.4 grams Dextrose: 18.7% / 329.4 grams Lipids: 66 grams kCal: 2197 --Electrolytes in TPN: Na 5mq/L,  K 30 mEq/L, Ca 5101m/L,  3 mEq/L and Phos 5 mmol/L. Cl:Ac 1:1 -on lasix '20mg'$  IV bid --Add standard MVI and trace elements to TPN, remove chromium d/t renal insuffciency -Thiamine '100mg'$  x3 days completed --Monitor TPN labs on Mon/Thurs, and  as needed until stable  RoDallie Piles0/19/2023,8:25 AM

## 2022-01-23 NOTE — Progress Notes (Signed)
[facility]  ROUNDING NOTE   Subjective:   Patient seen laying in bed, safety sitter at bedside No family at bedside NG tube still in place, moderate amount of drainage noted Suprapubic catheter  TPN infusing    Objective:  Vital signs in last 24 hours:  Temp:  [97.4 F (36.3 C)-98.3 F (36.8 C)] 97.9 F (36.6 C) (10/19 0716) Pulse Rate:  [57-80] 57 (10/19 0716) Resp:  [17-20] 17 (10/19 0716) BP: (130-171)/(59-69) 148/59 (10/19 0716) SpO2:  [96 %-100 %] 100 % (10/19 0716)  Weight change:  Filed Weights   01/18/22 0405 01/21/22 0419 01/21/22 1134  Weight: 66.4 kg 62.2 kg 64.7 kg    Intake/Output: I/O last 3 completed shifts: In: 4678.6 [P.O.:630; I.V.:3778.5; IV Piggyback:270.2] Out: 4250 [Urine:4100; Emesis/NG output:150]   Intake/Output this shift:  No intake/output data recorded.  Physical Exam: General: NAD  Head: Normocephalic, atraumatic.   Eyes: Anicteric  Lungs:  Clear to auscultation, normal effort  Heart: Regular rate and rhythm  Abdomen:  Soft, nontender  Extremities:  No peripheral edema.  Neurologic: Nonfocal, moving all four extremities  Skin: No lesions  Access: None  GU-suprapubic catheter-hematuria  Basic Metabolic Panel: Recent Labs  Lab 01/17/22 0615 01/18/22 0352 01/18/22 0352 01/19/22 0528 01/20/22 0800 01/22/22 0328 01/23/22 0402  NA  --  137  --  139 139 138 135  K  --  4.6  --  4.7 5.0 4.6 4.3  CL  --  103  --  103 105 106 104  CO2  --  29  --  '29 28 25 23  '$ GLUCOSE  --  113*  --  115* 116* 106* 102*  BUN  --  91*  --  99* 100* 115* 113*  CREATININE  --  5.03*  --  5.07* 4.85* 4.34* 3.97*  CALCIUM  --  9.5   < > 9.9 9.7 9.9 9.3  MG 2.4 2.5*  --  2.6* 2.4  --  2.1  PHOS 3.1 2.2*  --  2.7 2.7  --  2.7   < > = values in this interval not displayed.    Liver Function Tests: Recent Labs  Lab 01/20/22 0800 01/23/22 0402  AST 15 20  ALT 6 12  ALKPHOS 63 76  BILITOT 0.5 0.6  PROT 5.2* 5.3*  ALBUMIN 2.2*  2.3*   No results for input(Troy): "LIPASE", "AMYLASE" in the last 168 hours. No results for input(Troy): "AMMONIA" in the last 168 hours.  CBC: Recent Labs  Lab 01/17/22 0615 01/19/22 0528 01/20/22 0800 01/22/22 0328 01/23/22 0402  WBC 9.1 8.1 6.6 8.1 8.3  NEUTROABS  --  5.7 4.8  --   --   HGB 7.5* 7.0* 6.3* 7.5* 7.2*  HCT 23.3* 22.0* 19.9* 23.4* 22.8*  MCV 83.8 84.3 85.4 86.0 86.0  PLT 197 182 175 196 183    Cardiac Enzymes: No results for input(Troy): "CKTOTAL", "CKMB", "CKMBINDEX", "TROPONINI" in the last 168 hours.  BNP: Invalid input(Troy): "POCBNP"  CBG: Recent Labs  Lab 01/21/22 0503 01/21/22 1144 01/21/22 1700 01/21/22 2305 01/22/22 0614  GLUCAP 116* 114* 130* 97 93    Microbiology: Results for orders placed or performed during the hospital encounter of 01/03/22  Urine Culture     Status: None   Collection Time: 01/05/22  8:56 AM   Specimen: Urine, Suprapubic  Result Value Ref Range Status   Specimen Description   Final    URINE, SUPRAPUBIC Performed at [facility], [address] [place], [place] [address]    Special Requests   Final    NONE Performed at [facility], [address] [place], [place] [address]    Culture   Final    NO GROWTH Performed at [facility], [place] [address] [place], [place] [address]    Report Status 01/06/2022 FINAL  Final    Coagulation Studies: No results for input(Troy): "LABPROT", "INR" in the last 72 hours.  Urinalysis: No results for input(Troy): "COLORURINE", "LABSPEC", "PHURINE", "GLUCOSEU", "HGBUR", "BILIRUBINUR", "KETONESUR", "PROTEINUR", "UROBILINOGEN", "NITRITE", "LEUKOCYTESUR" in the last 72 hours.  Invalid input(Troy): "APPERANCEUR"    Imaging: DG Abd 1 View  Result Date: 01/22/2022 CLINICAL DATA:  Abdominal distension. EXAM: ABDOMEN - 1 VIEW COMPARISON:  January 21, 2022. FINDINGS: The bowel gas pattern is normal. Nasogastric tube tip is seen in stomach. Midline surgical staples are  noted. Surgical drain or suprapubic urinary catheter is noted in the pelvis. IMPRESSION: No abnormal bowel dilatation. Electronically Signed   By: Marijo Conception M.D.   On: 01/22/2022 08:27   DG Abd Portable 1V  Result Date: 01/21/2022 CLINICAL DATA:  Nasogastric tube placement. EXAM: PORTABLE ABDOMEN - 1 VIEW COMPARISON:  CT 01/15/2022. Radiographs 01/20/2022, 01/14/2022 and 01/06/2022. FINDINGS: 1429 hours. Single semi erect view of the abdomen centered at the gastroesophageal junction demonstrates an enteric tube projecting over the left upper quadrant of the abdomen. The side-hole is below the GE junction. Diffuse bowel distension again noted, mildly increased from yesterday. There is atelectasis at the right lung base. Abdominal skin staples are in place. IMPRESSION: 1. Enteric tube projects over the left upper quadrant of the abdomen, likely in the mid stomach. 2. Mildly increased bowel distension, still likely reflecting a postoperative ileus. Electronically Signed   By: Richardean Sale M.D.   On: 01/21/2022 15:12     Medications:    sodium chloride Stopped (01/22/22 2041)   TPN ADULT (ION) 75 mL/hr at 01/23/22 0631    Chlorhexidine Gluconate Cloth  6 each Topical Daily   furosemide  20 mg Intravenous BID   influenza vaccine adjuvanted  0.5 mL Intramuscular Tomorrow-1000   pneumococcal 20-valent conjugate vaccine  0.5 mL Intramuscular Tomorrow-1000   polyethylene glycol  17 g Oral BID   sodium chloride flush  10-40 mL Intracatheter Q12H   sodium chloride flush  3 mL Intravenous Q12H   sodium chloride, acetaminophen **OR** acetaminophen, haloperidol lactate, hydrALAZINE, HYDROmorphone (DILAUDID) injection, iohexol, ondansetron (ZOFRAN) IV, sodium chloride flush, sodium chloride flush  Assessment/ Plan:  [name] is a 76 y.o.  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 Patient continues to have adequate urine output.  We will continue to follow patient'Troy labs Patient creatinine is trending down  No acute need for dialysis at this time.  Patient will make a poor long-term dialysis patient.  We will continue to monitor.  Lab Results  Component Value Date   CREATININE 3.97 (H) 01/23/2022   CREATININE 4.34 (H) 01/22/2022   CREATININE 4.85 (H) 01/20/2022    Intake/Output Summary (Last 24 hours) at 01/23/2022 0756 Last data filed at 01/23/2022 0631 Gross per 24 hour  Intake 2771 ml  Output 2750 ml  Net 21 ml  Hypernatremia resolved . 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    Small bowel obstruction Status post exploratory laparotomy on 01/07/2022. Experienced wound dehiscence with repair in OR on 01/10/22. TPN infusing   LOS: 19 Troy Berry Troy Berry 10/19/20237:56 AM

## 2022-01-23 NOTE — Progress Notes (Signed)
Patient ID: Troy Beals., male   DOB: 05-30-1945, 76 y.o.   MRN: [id number]     [facility] Day(s): 19.   Interval History: Patient seen and examined.  Patient was awake, alert, comfortable.  He was more responsive and talkative today.  As per nurse there has been no clinical deterioration.  No vomiting.  Vital signs in last 24 hours: [min-max] current  Temp:  [97.4 F (36.3 C)-98.3 F (36.8 C)] 97.9 F (36.6 C) (10/19 0716) Pulse Rate:  [57-80] 57 (10/19 0716) Resp:  [17-20] 17 (10/19 0716) BP: (130-171)/(59-69) 148/59 (10/19 0716) SpO2:  [96 %-100 %] 100 % (10/19 0716)     Height: '5\' 9"'$  (175.3 cm) Weight: 64.7 kg BMI (Calculated): 21.05   Physical Exam:  Constitutional: alert, cooperative and no distress  Respiratory: breathing non-labored at rest  Cardiovascular: regular rate and sinus rhythm  Gastrointestinal: soft, non-tender, and non-distended  Labs:     Latest Ref Rng & Units 01/23/2022    4:02 AM 01/22/2022    3:28 AM 01/20/2022    8:00 AM  CBC  WBC 4.0 - 10.5 K/uL 8.3  8.1  6.6   Hemoglobin 13.0 - 17.0 g/dL 7.2  7.5  6.3   Hematocrit 39.0 - 52.0 % 22.8  23.4  19.9   Platelets 150 - 400 K/uL 183  196  175       Latest Ref Rng & Units 01/23/2022    4:02 AM 01/22/2022    3:28 AM 01/20/2022    8:00 AM  CMP  Glucose 70 - 99 mg/dL 102  106  116   BUN 8 - 23 mg/dL 113  115  100   Creatinine 0.61 - 1.24 mg/dL 3.97  4.34  4.85   Sodium 135 - 145 mmol/L 135  138  139   Potassium 3.5 - 5.1 mmol/L 4.3  4.6  5.0   Chloride 98 - 111 mmol/L 104  106  105   CO2 22 - 32 mmol/L '23  25  28   '$ Calcium 8.9 - 10.3 mg/dL 9.3  9.9  9.7   Total Protein 6.5 - 8.1 g/dL 5.3   5.2   Total Bilirubin 0.3 - 1.2 mg/dL 0.6   0.5   Alkaline Phos 38 - 126 U/L 76   63   AST 15 - 41 U/L 20   15   ALT 0 - 44 U/L 12   6     Imaging studies: No new pertinent imaging studies   Assessment/Plan:  76 y.o. male with small bowel obstruction 16 Day Post-Op s/p  reduction of internal hernia.  The was complicated by midline wound dehiscence status post wound closure postop day #13.     Prolonged postoperative ileus  -No clinical deterioration.  Abdomen continues to be soft today.  Patient getting clear liquids.  No bowel movement charted. -We will continue critical diet for another 24 hours to see if the abdomen get distended again before removing NGT. -If he continue with his abdomen soft and if there is sign of bowel function will consider removing NGT again tomorrow. -I will continue to follow closely.  Arnold Long, MD

## 2022-01-23 NOTE — Progress Notes (Addendum)
Palliative Care Progress Note, Assessment & Plan   [name].       Date: 01/23/2022 DOB: [birth year]  Age: 76 y.o. MRN#: [id number] Attending Physician: Elmarie Shiley, MD Primary Care Physician: [facility] [place] Date: 01/03/2022  Reason for Consultation/Follow-up: Establishing goals of care  Subjective: Patient is sitting up in bed in no apparent distress.  NG tube is in place to right nare to low intermittent wall suction.  Patient acknowledges my presence and able to make his wishes known.  His wife is at bedside.  She is asking when PT will be available to work with patient as he is awake and ready to work with PT now.  HPI: 76 y.o. male  with past medical history of atonic bladder status post suprapubic catheter, CKD (stage IV), and secondary hypertension admitted on 01/03/2022 with complaints of supra pubic catheter dysfunction and abdominal distention.  Hospital course includes ex lap and reduction of internal hernia as well as additional ex lap for wound closure.  Patient has received PRBCs due to low hemoglobin, foley catheter remains, TPN has been initiated via PICC line, and NG tube replaced 10/17 for vomiting and increase distention. Patient is being followed by general surgery and nephrology.   PMT was consulted to discuss goals of care.  Summary of counseling/coordination of care: After reviewing the patient's chart and assessing the patient at bedside, I spoke with patient's wife in regards to plan of care.  Patient wife shares she wants to continue to focus on patient getting stronger.  She is anxiously awaiting PT to work with her husband.  She shares she is most awake in the mornings and hopes that PT can work with them.  I discussed with  patient and his wife that PMT's role is to help clarify goals of care and assist with management of relief from suffering/pain due to chronic illness. Patient shares he is in no pain. Wife shares she is pleased to have our support and thanks me for everything we have done. I again attempted to discuss [place] but wife was focused on getting pt to work with PT.  I shared with patient and wife that PMT will remain available to them for discussion and support of medical decision making. Advised them to reach out via PMT contact info or ask nursing to contact PMT for any palliative needs that arise between now and Monday.  PMT will continue to follow the patient throughout his hospitalization.   Time for outcomes needed. No change to plan of care at this time.  Physical Exam Vitals reviewed.  HENT:     Head: Normocephalic.     Nose:     Comments: NG in place Eyes:     Pupils: Pupils are equal, round, and reactive to light.  Cardiovascular:     Rate and Rhythm: Normal rate.  Pulmonary:     Effort: Pulmonary effort is normal.  Abdominal:     General: There is no distension.     Palpations: Abdomen is soft.     Tenderness: There is no abdominal tenderness.  Musculoskeletal:     Comments: Generalized weakness  Skin:    General:  Skin is warm and dry.  Neurological:     Mental Status: He is alert.  Psychiatric:        Mood and Affect: Mood normal.        Behavior: Behavior normal.        Thought Content: Thought content normal.        Judgment: Judgment normal.             Palliative Assessment/Data: 50%    Total Time 35 minutes  Greater than 50%  of this time was spent counseling and coordinating care related to the above assessment and plan.  Thank you for allowing the Palliative Medicine Team to assist in the care of this patient.  [place] Ilsa Iha, FNP-BC Palliative Medicine Team Team Phone # [phone]

## 2022-01-24 DIAGNOSIS — N184 Chronic kidney disease, stage 4 (severe): Secondary | ICD-10-CM | POA: Diagnosis not present

## 2022-01-24 DIAGNOSIS — N179 Acute kidney failure, unspecified: Secondary | ICD-10-CM | POA: Diagnosis not present

## 2022-01-24 LAB — BASIC METABOLIC PANEL
Anion gap: 7 (ref 5–15)
BUN: 101 mg/dL — ABNORMAL HIGH (ref 8–23)
CO2: 23 mmol/L (ref 22–32)
Calcium: 9.9 mg/dL (ref 8.9–10.3)
Chloride: 106 mmol/L (ref 98–111)
Creatinine, Ser: 3.76 mg/dL — ABNORMAL HIGH (ref 0.61–1.24)
GFR, Estimated: 16 mL/min — ABNORMAL LOW (ref >60)
Glucose, Bld: 107 mg/dL — ABNORMAL HIGH (ref 70–99)
Potassium: 4.5 mmol/L (ref 3.5–5.1)
Sodium: 136 mmol/L (ref 135–145)

## 2022-01-24 LAB — HEMOGLOBIN AND HEMATOCRIT, BLOOD
HCT: 23.8 % — ABNORMAL LOW (ref 39.0–52.0)
Hemoglobin: 7.5 g/dL — ABNORMAL LOW (ref 13.0–17.0)

## 2022-01-24 MED ORDER — TRACE MINERALS CU-MN-SE-ZN 300-55-60-3000 MCG/ML IV SOLN
INTRAVENOUS | Status: AC
  Filled 2022-01-24: qty 696

## 2022-01-24 NOTE — Consult Note (Signed)
PHARMACY - TOTAL PARENTERAL NUTRITION CONSULT NOTE   Indication: Prolonged ileus  Patient Measurements: Height: '5\' 9"'$  (175.3 cm) Weight: 64.7 kg (142 lb 10.2 oz) IBW/kg (Calculated) : 70.7 TPN AdjBW (KG): 73 Body mass index is 21.06 kg/m.   Assessment: 76 y.o. male with medical history significant of atonic bladder s/p suprapubic catheter, CKD Stage 4, anemia of chronic kidney disease, hypertension, gout found with small bowel obstruction 7 Day Post-Op s/p reduction of internal hernia.  This was complicated by midline wound dehiscence status post wound closure postop    Glucose / Insulin: SSI stopped, not required Electrolytes: electrolytes wnl  Renal: Scr 4.85-->3.76 (baseline 2.5-3, per nephrology no HD needed), BUN 70>>91 Hepatic: LFTs WNL Intake / Output; MIVF: +1L GI Imaging:  -CT 10/11: Persistent small bowel dilatation with fluid-filled loops.  -CT 10/9:  findings favor ileus versus a partial small bowel obstruction, GI Surgeries / Procedures:  -Ex Lap 10/6: closure of opened abdominal incision wound -Ex Lap 10/3: reduction of internal hernia   Central access: DL PICC placed 10/11 TPN start date: 10/11  Nutritional Goals: Goal TPN rate is 75 mL/hr (provides 104.4 g of protein and 2197 kcals per day)  RD Assessment: Estimated Needs Total Energy Estimated Needs: 1800-2200kcal/day Total Protein Estimated Needs: 90-105g/day Total Fluid Estimated Needs: 1.7-2.0L/day  Current Nutrition:  NPO  10/14: did not tolerate NGT clamp trial  Plan:  -- continue TPN at 75 ml/hr --nutritional components Amino acids (using 15% Clinisol) 104.4 grams Dextrose: 18.7% / 329.4 grams Lipids: 66 grams kCal: 2197 --Electrolytes in TPN: Na 26mq/L,  K 30 mEq/L, Ca 528m/L,  3 mEq/L and Phos 5 mmol/L. Cl:Ac 1:1 -on lasix '20mg'$  IV bid --Add standard MVI and trace elements to TPN, remove chromium d/t renal insufficiency --Monitor TPN labs on Mon/Thurs, and as needed until stable  RoDallie Piles0/20/2023,9:10 AM

## 2022-01-24 NOTE — Progress Notes (Addendum)
[facility]  ROUNDING NOTE   Subjective:   Patient seen laying in bed, safety sitter at bedside Wife present at bedside Patient more alert today NGT remains in place, tolerating clear liquid diet. TPN remains  Objective:  Vital signs in last 24 hours:  Temp:  [97.9 F (36.6 C)-98.8 F (37.1 C)] 98.2 F (36.8 C) (10/20 0751) Pulse Rate:  [72-90] 72 (10/20 0751) Resp:  [16-18] 17 (10/20 0751) BP: (135-161)/(54-72) 135/54 (10/20 0751) SpO2:  [97 %-100 %] 100 % (10/20 0751)  Weight change:  Filed Weights   01/18/22 0405 01/21/22 0419 01/21/22 1134  Weight: 66.4 kg 62.2 kg 64.7 kg    Intake/Output: I/O last 3 completed shifts: In: 1827.6 [P.O.:630; I.V.:930.5; IV Piggyback:267.1] Out: 3150 [Urine:3150]   Intake/Output this shift:  No intake/output data recorded.  Physical Exam: General: NAD  Head: Normocephalic, atraumatic.   Eyes: Anicteric  Lungs:  Clear to auscultation, normal effort  Heart: Regular rate and rhythm  Abdomen:  Soft, nontender  Extremities:  No peripheral edema.  Neurologic: Nonfocal, moving all four extremities  Skin: No lesions  Access: None  GU-suprapubic catheter-hematuria  Basic Metabolic Panel: Recent Labs  Lab 01/18/22 0352 01/19/22 0528 01/20/22 0800 01/22/22 0328 01/23/22 0402 01/24/22 0710  NA 137 139 139 138 135 136  K 4.6 4.7 5.0 4.6 4.3 4.5  CL 103 103 105 106 104 106  CO2 '29 29 28 25 23 23  '$ GLUCOSE 113* 115* 116* 106* 102* 107*  BUN 91* 99* 100* 115* 113* 101*  CREATININE 5.03* 5.07* 4.85* 4.34* 3.97* 3.76*  CALCIUM 9.5 9.9 9.7 9.9 9.3 9.9  MG 2.5* 2.6* 2.4  --  2.1  --   PHOS 2.2* 2.7 2.7  --  2.7  --      Liver Function Tests: Recent Labs  Lab 01/20/22 0800 01/23/22 0402  AST 15 20  ALT 6 12  ALKPHOS 63 76  BILITOT 0.5 0.6  PROT 5.2* 5.3*  ALBUMIN 2.2* 2.3*    No results for input(s): "LIPASE", "AMYLASE" in the last 168 hours. No results for input(s): "AMMONIA" in the last 168  hours.  CBC: Recent Labs  Lab 01/19/22 0528 01/20/22 0800 01/22/22 0328 01/23/22 0402 01/24/22 0710  WBC 8.1 6.6 8.1 8.3  --   NEUTROABS 5.7 4.8  --   --   --   HGB 7.0* 6.3* 7.5* 7.2* 7.5*  HCT 22.0* 19.9* 23.4* 22.8* 23.8*  MCV 84.3 85.4 86.0 86.0  --   PLT 182 175 196 183  --      Cardiac Enzymes: No results for input(s): "CKTOTAL", "CKMB", "CKMBINDEX", "TROPONINI" in the last 168 hours.  BNP: Invalid input(s): "POCBNP"  CBG: Recent Labs  Lab 01/21/22 0503 01/21/22 1144 01/21/22 1700 01/21/22 2305 01/22/22 0614  GLUCAP 116* 114* 130* 97 93     Microbiology: Results for orders placed or performed during the hospital encounter of 01/03/22  Urine Culture     Status: None   Collection Time: 01/05/22  8:56 AM   Specimen: Urine, Suprapubic  Result Value Ref Range Status   Specimen Description   Final    URINE, SUPRAPUBIC Performed at [facility], [address] [place], [place] [address]    Special Requests   Final    NONE Performed at [facility], [address] [place], [place] [address]    Culture   Final    NO GROWTH Performed at [facility], [place],  [place] [address]    Report Status 01/06/2022 FINAL  Final    Coagulation Studies: No results for input(s): "LABPROT", "INR" in the last 72 hours.  Urinalysis: No results for input(s): "COLORURINE", "LABSPEC", "PHURINE", "GLUCOSEU", "HGBUR", "BILIRUBINUR", "KETONESUR", "PROTEINUR", "UROBILINOGEN", "NITRITE", "LEUKOCYTESUR" in the last 72 hours.  Invalid input(s): "APPERANCEUR"    Imaging: No results found.   Medications:    sodium chloride Stopped (01/22/22 2041)   TPN ADULT (ION) 75 mL/hr at 01/23/22 1833   TPN ADULT (ION)      Chlorhexidine Gluconate Cloth  6 each Topical Daily   furosemide  20 mg Intravenous BID   polyethylene glycol  17 g Oral BID   sodium chloride flush  10-40 mL Intracatheter Q12H   sodium chloride flush  3 mL  Intravenous Q12H   sodium chloride, acetaminophen **OR** acetaminophen, haloperidol lactate, hydrALAZINE, HYDROmorphone (DILAUDID) injection, iohexol, ondansetron (ZOFRAN) IV, sodium chloride flush, sodium chloride flush  Assessment/ Plan:  [name] is a 76 y.o.  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 Patient continues to have adequate urine output.  We will continue to follow patient's labs Creatinine continues to improve, adequate urine output remains. Will continue to follow.   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 Stable at this time   Gross hematuria/Atonic bladder Evaluated by urologist on 01/03/2022.    Small bowel obstruction Status post exploratory laparotomy on 01/07/2022. Experienced wound dehiscence with repair in OR on 01/10/22. TPN remains in place due to prolonged ileus.    LOS: [place] 10/20/20231:11 PM  I saw and evaluated the patient with Colon Flattery, NP.  I personally formulated the plan of care.  I agree with the findings and plan as documented in the note except as noted.

## 2022-01-24 NOTE — Progress Notes (Signed)
Patient ID: Troy Beals., male   DOB: 1945/11/08, 76 y.o.   MRN: [id number]     [facility] Hospital Day(s): 20.   Interval History: Patient seen and examined.  As per nurse, patient had 3 bowel movement last night.  No sign of nausea or vomiting.  Vital signs in last 24 hours: [min-max] current  Temp:  [97.9 F (36.6 C)-98.8 F (37.1 C)] 98.2 F (36.8 C) (10/20 0751) Pulse Rate:  [72-90] 72 (10/20 0751) Resp:  [16-18] 17 (10/20 0751) BP: (135-161)/(54-72) 135/54 (10/20 0751) SpO2:  [97 %-100 %] 100 % (10/20 0751)     Height: '5\' 9"'$  (175.3 cm) Weight: 64.7 kg BMI (Calculated): 21.05   Physical Exam:  Constitutional: alert, cooperative and no distress  Respiratory: breathing non-labored at rest  Cardiovascular: regular rate and sinus rhythm  Gastrointestinal: soft, non-tender, and mild-distended  Labs:     Latest Ref Rng & Units 01/23/2022    4:02 AM 01/22/2022    3:28 AM 01/20/2022    8:00 AM  CBC  WBC 4.0 - 10.5 K/uL 8.3  8.1  6.6   Hemoglobin 13.0 - 17.0 g/dL 7.2  7.5  6.3   Hematocrit 39.0 - 52.0 % 22.8  23.4  19.9   Platelets 150 - 400 K/uL 183  196  175       Latest Ref Rng & Units 01/24/2022    7:10 AM 01/23/2022    4:02 AM 01/22/2022    3:28 AM  CMP  Glucose 70 - 99 mg/dL 107  102  106   BUN 8 - 23 mg/dL 101  113  115   Creatinine 0.61 - 1.24 mg/dL 3.76  3.97  4.34   Sodium 135 - 145 mmol/L 136  135  138   Potassium 3.5 - 5.1 mmol/L 4.5  4.3  4.6   Chloride 98 - 111 mmol/L 106  104  106   CO2 22 - 32 mmol/L '23  23  25   '$ Calcium 8.9 - 10.3 mg/dL 9.9  9.3  9.9   Total Protein 6.5 - 8.1 g/dL  5.3    Total Bilirubin 0.3 - 1.2 mg/dL  0.6    Alkaline Phos 38 - 126 U/L  76    AST 15 - 41 U/L  20    ALT 0 - 44 U/L  12      Imaging studies: No new pertinent imaging studies   Assessment/Plan:  76 y.o. male with small bowel obstruction 17 Day Post-Op s/p reduction of internal hernia.  The was complicated by midline wound dehiscence status post  wound closure postop day #14.     Prolonged postoperative ileus  -No clinical deterioration.  Mildly distended abdomen this morning.  Will advance to full liquid to see if he can tolerate it with NGT in place before removing the NGT again.  If he cannot get the full liquid with the NGT in place will consider removing NGT. -Renal function seems to be improving slowly.  Hopefully they will also help to resolve the prolonged ileus -I will continue to follow closely.  Arnold Long, MD

## 2022-01-24 NOTE — Progress Notes (Signed)
Occupational Therapy Treatment Patient Details Name: Troy Berry. MRN: [id number] DOB: 1946-02-27 Today's Date: 01/24/2022   History of present illness Patient is a 76 y.o.  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 He was admitted on 01/03/2022 for Acute on chronic renal failure, small bowel obstruction  s/p reduction of internal hernia. patient with midline wound dehiscence status post wound closure on 10/6.   OT comments  Troy Berry was lethargic throughout session, and largely non-verbal. He did not respond to questioning re: pain, although did not exhibit any signs indicative of pain. He was able to follow one-step directions ~ 50% of time. Engaged in bed level UE and LE therex, able to roll in bed, to both R and L, with Mod A, required Max A for supine<>sit and for maintaining sitting balance, with strong R lateral lean. Pt left in supine, with b/l hand mitts, 2/2 pt pulling at NG tube. Bed alarm activated, pt with sitter in room.    Recommendations for follow up therapy are one component of a multi-disciplinary discharge planning process, led by the attending physician.  Recommendations may be updated based on patient status, additional functional criteria and insurance authorization.    Follow Up Recommendations  Skilled nursing-short term rehab (<3 hours/day)    Assistance Recommended at Discharge Frequent or constant Supervision/Assistance  Patient can return home with the following  A lot of help with walking and/or transfers;A lot of help with bathing/dressing/bathroom;Assistance with feeding;Assistance with cooking/housework;Direct supervision/assist for medications management;Direct supervision/assist for financial management;Assist for transportation   Equipment Recommendations  None recommended by OT    Recommendations for Other Services      Precautions / Restrictions  Precautions Precautions: Fall Precaution Comments: suprapubic catheter; abdominal incision; NG tube Restrictions Weight Bearing Restrictions: No       Mobility Bed Mobility Overal bed mobility: Needs Assistance Bed Mobility: Supine to Sit, Sit to Supine, Rolling Rolling: Mod assist   Supine to sit: Max assist Sit to supine: Max assist        Transfers                   General transfer comment: pt unable to attempt this date     Balance Overall balance assessment: Needs assistance Sitting-balance support: Bilateral upper extremity supported, Feet supported Sitting balance-[name]: Poor Sitting balance - Comments: constant Mod-Max A to support sitting balance                                   ADL either performed or assessed with clinical judgement   ADL                                              Extremity/Trunk Assessment Upper Extremity Assessment Upper Extremity Assessment: Generalized weakness   Lower Extremity Assessment Lower Extremity Assessment: Generalized weakness        Vision       Perception     Praxis      Cognition Arousal/Alertness: Lethargic Behavior During Therapy: Flat affect Overall Cognitive Status: History of cognitive impairments - at baseline                   Orientation Level: Place, Time, Situation, Disoriented to  Following Commands: Follows one step commands inconsistently       General Comments: 1:1 sitter and B mitts in place for safety        Exercises Other Exercises Other Exercises: UE and LE AROM and PROM in supine; repositioning for comfort and skin integrity    Shoulder Instructions       General Comments no drainage noted from abdominal incision; pt burping repeatedly throughout session    Pertinent Vitals/ Pain       Pain Assessment Breathing: normal Negative Vocalization: none Facial Expression: smiling or inexpressive Body Language:  tense, distressed pacing, fidgeting Consolability: no need to console PAINAD Score: 1 Pain Intervention(s): Repositioned  Home Living                                          Prior Functioning/Environment              Frequency  Min 2X/week        Progress Toward Goals  OT Goals(current goals can now be found in the care plan section)  Progress towards OT goals: Progressing toward goals  Acute Rehab OT Goals OT Goal Formulation: With family Time For Goal Achievement: 01/24/22 Potential to Achieve Goals: [place] Discharge plan remains appropriate;Frequency remains appropriate    Co-evaluation                 AM-PAC OT "6 Clicks" Daily Activity     Outcome Measure   Help from another person eating meals?: A Little Help from another person taking care of personal grooming?: A Lot Help from another person toileting, which includes using toliet, bedpan, or urinal?: A Lot Help from another person bathing (including washing, rinsing, drying)?: A Lot Help from another person to put on and taking off regular upper body clothing?: A Lot Help from another person to put on and taking off regular lower body clothing?: A Lot 6 Click Score: 13    End of Session    OT Visit Diagnosis: Unsteadiness on feet (R26.81);Repeated falls (R29.6);Muscle weakness (generalized) (M62.81)   Activity Tolerance Patient limited by lethargy   Patient Left in bed;with call bell/phone within reach;with bed alarm set;with nursing/sitter in room;with restraints reapplied   Nurse Communication          Time: 0737-1062 OT Time Calculation (min): 10 min  Charges: OT General Charges $OT Visit: 1 Visit OT Treatments $Self Care/Home Management : 8-22 mins  Troy Lobo, PhD, MS, OTR/L 01/24/22, 1:57 PM

## 2022-01-24 NOTE — Plan of Care (Signed)
  Problem: Education: Goal: Knowledge of General Education information will improve Description: Including pain rating scale, medication(s)/side effects and non-pharmacologic comfort measures Outcome: Progressing   Problem: Health Behavior/Discharge Planning: Goal: Ability to manage health-related needs will improve Outcome: Progressing   Problem: Clinical Measurements: Goal: Ability to maintain clinical measurements within normal limits will improve Outcome: Progressing Goal: Will remain free from infection Outcome: Progressing Goal: Diagnostic test results will improve Outcome: Progressing Goal: Respiratory complications will improve Outcome: Progressing Goal: Cardiovascular complication will be avoided Outcome: Progressing   Problem: Activity: Goal: Risk for activity intolerance will decrease Outcome: Progressing   Problem: Nutrition: Goal: Adequate nutrition will be maintained Outcome: Progressing   Problem: Coping: Goal: Level of anxiety will decrease Outcome: Progressing   Problem: Elimination: Goal: Will not experience complications related to bowel motility Outcome: Progressing Goal: Will not experience complications related to urinary retention Outcome: Progressing   Problem: Pain Managment: Goal: General experience of comfort will improve Outcome: Progressing   Problem: Safety: Goal: Ability to remain free from injury will improve Outcome: Progressing   Problem: Skin Integrity: Goal: Risk for impaired skin integrity will decrease Outcome: Progressing   Problem: Education: Goal: Understanding of discharge needs will improve Outcome: Progressing Goal: Verbalization of understanding of the causes of altered bowel function will improve Outcome: Progressing   Problem: Activity: Goal: Ability to tolerate increased activity will improve Outcome: Progressing   Problem: Bowel/Gastric: Goal: Gastrointestinal status for postoperative course will  improve Outcome: Progressing   Problem: Health Behavior/Discharge Planning: Goal: Identification of community resources to assist with postoperative recovery needs will improve Outcome: Progressing   Problem: Nutritional: Goal: Will attain and maintain optimal nutritional status will improve Outcome: Progressing   Problem: Clinical Measurements: Goal: Postoperative complications will be avoided or minimized Outcome: Progressing   Problem: Respiratory: Goal: Respiratory status will improve Outcome: Progressing   Problem: Skin Integrity: Goal: Will show signs of wound healing Outcome: Progressing   Problem: Education: Goal: Ability to describe self-care measures that may prevent or decrease complications (Diabetes Survival Skills Education) will improve Outcome: Progressing Goal: Individualized Educational Video(s) Outcome: Progressing   Problem: Coping: Goal: Ability to adjust to condition or change in health will improve Outcome: Progressing   Problem: Fluid Volume: Goal: Ability to maintain a balanced intake and output will improve Outcome: Progressing   Problem: Health Behavior/Discharge Planning: Goal: Ability to identify and utilize available resources and services will improve Outcome: Progressing Goal: Ability to manage health-related needs will improve Outcome: Progressing   Problem: Metabolic: Goal: Ability to maintain appropriate glucose levels will improve Outcome: Progressing   Problem: Nutritional: Goal: Maintenance of adequate nutrition will improve Outcome: Progressing Goal: Progress toward achieving an optimal weight will improve Outcome: Progressing   Problem: Skin Integrity: Goal: Risk for impaired skin integrity will decrease Outcome: Progressing   Problem: Tissue Perfusion: Goal: Adequacy of tissue perfusion will improve Outcome: Progressing

## 2022-01-24 NOTE — Progress Notes (Signed)
PROGRESS NOTE    Troy Berry.  WLN:[id number] DOB: 30-Jan-1946 DOA: 01/03/2022 PCP: [facility]   Brief Narrative: 76 year old with past medical history significant for atonic bladder status post suprapubic catheter, stage IV CKD, hypertension presents to the ED on 9/30 with complaint of suprapubic catheter dysfunction as well as abdominal distention.  Patient was found to have possible SBO versus ileus.  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  General surgery and urology were consulted.  Patient was a started on cefepime to cover for UTI.  NG tube placed for abdominal distention.  Patient did not improve with conservative management and underwent exploratory laparoscopy and reduction of internal hernia 10/3.  Patient was noted to have acute onset of M-End and serous drainage through day 1, wound dehiscence and omental exposure.  On 10/6 patient was taken to the OR for wound closure.  He was noted to have distended bladder despite suprapubic catheter.  Bloody urine was aspirated.  There is no evidence of bowel obstruction.  On 7/11 decision was made to start patient on TPN.  NG tube has been clamped, he had a bowel movement.  Started on clear diet.     Assessment & Plan:   Principal Problem:   Acute renal failure superimposed on stage 4 chronic kidney disease (HCC) Active Problems:   SBO (small bowel obstruction) (HCC)   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HCC)   Anemia   Hematuria   Urinary tract infection   Metabolic acidosis   Essential hypertension   Gross hematuria   Dementia without behavioral disturbance (HCC)   Hypokalemia   Acute blood loss anemia   Protein-calorie malnutrition, severe   Wound dehiscence   1-SBO -Status post reduction of ventral hernia complicated by midline wound  dehiscence he is status post wound closure. -CT shows some evidence of bowel dilation with no focal point demonstrated..  CT cystogram demonstrated persistent small bowel dilation with evidence of focal transition in the central mesentery.  Patient did not improve with conservative management.  Underwent lap and reduction of hernia 10/3. -TPN due to persistent ileus. -NG tube has been clamped. Diet advanced to full liquid.  Discontinue morphine due to CKD. Low dose dilaudid PRN order  Plan to continue with current plan.   2-AKI on CKD stage IV Creatinine peaked to 4.8 compared to prior 3.5 Component of obstructive uropathy and suprapubic catheter dysfunction.  Urology replace suprapubic catheter. Also component of ATN.  Nephrology following, patient not felt to be a good long term candidate for dialysis.  Continue to have good urine output.  Creatinine trending down Monitor on IV lasix.  Cr down to 3.7  3-suprapubic catheter dysfunction: History of atonic bladder requiring suprapubic catheter placed approximately 1 month ago 80/31/2023. Presented with hematuria and poor urinary drainage. Evaluated by urology 9/27, it was discovered that suprapubic catheter tip was not in the bladder but adjacent to the small bowel.  Urology consulted and suprapubic catheter replaced with cystogram demonstrating appropriate placement within the bladder. Hematuria persist. Monitor.   Anemia, acute blood loss anemia postsurgery, hematuria and combination of anemia of chronic disease Patient has had multiple blood transfusion total of 3 Received IV iron 10/18 Monitor hemoglobin  Hematuria secondary to suprapubic catheter dysfunction  Urinary tract infection secondary to obstructive uropathy.  Completed course of IV antibiotics Hyponatremia: Resolved  Metabolic acidosis  in the setting of CKD. resolved  Hypertension: Continue amlodipine carvedilol  Dementia : At risk for delirium.  He was agitated last  night received Haldol.  He is more alert today.  Delirium precaution.   Wound dehiscence Following exploratory laparotomy, wound dehisced requiring patient to go back to the OR on 10/6.  Surgery following   Protein-calorie malnutrition, severe Nutrition Status: Nutrition Problem: Severe Malnutrition Etiology: chronic illness (dementia, CKD, internal hernia) Signs/Symptoms: severe fat depletion, severe muscle depletion on TPN See nurse documentation.   Nutrition Problem: Severe Malnutrition Etiology: chronic illness (dementia, CKD, internal hernia)    Signs/Symptoms: severe fat depletion, severe muscle depletion       Estimated body mass index is 21.06 kg/m as calculated from the following:   Height as of this encounter: '5\' 9"'$  (1.753 m).   Weight as of this encounter: 64.7 kg.   DVT prophylaxis: SCD Code Status: Full code Family Communication: Wife at bedside 10/19.  Disposition Plan:  Status is: Inpatient Remains inpatient appropriate because: management of SBP post op complications.     Consultants:  Surgery Urology Nephrology  Palliative   Procedures:  Status post reduction of ventral hernia complicated by midline wound dehiscence he is status post wound closure.  Antimicrobials:    Subjective: He is alert, denies pain. Had BM last night.   Objective: Vitals:   01/23/22 1620 01/23/22 1923 01/23/22 2348 01/24/22 0751  BP: (!) 161/69 (!) 153/72 (!) 140/63 (!) 135/54  Pulse: 84 90 77 72  Resp: '17 16 18 17  '$ Temp: 97.9 F (36.6 C) 98.5 F (36.9 C) 98.8 F (37.1 C) 98.2 F (36.8 C)  TempSrc:    Axillary  SpO2: 100% 100% 97% 100%  Weight:      Height:        Intake/Output Summary (Last 24 hours) at 01/24/2022 1505 Last data filed at 01/23/2022 1700 Gross per 24 hour  Intake --  Output 650 ml  Net -650 ml    Filed Weights   01/18/22 0405 01/21/22 0419 01/21/22 1134  Weight: 66.4 kg 62.2 kg 64.7 kg    Examination:  General exam:  NAD Respiratory system: CTA Cardiovascular system: S 1, S 2 RRR Gastrointestinal system: Abdomen is nondistended, soft, mild line with staples.  Central nervous system: alert, follows command Extremities: No edema  Data Reviewed: I have personally reviewed following labs and imaging studies  CBC: Recent Labs  Lab 01/19/22 0528 01/20/22 0800 01/22/22 0328 01/23/22 0402 01/24/22 0710  WBC 8.1 6.6 8.1 8.3  --   NEUTROABS 5.7 4.8  --   --   --   HGB 7.0* 6.3* 7.5* 7.2* 7.5*  HCT 22.0* 19.9* 23.4* 22.8* 23.8*  MCV 84.3 85.4 86.0 86.0  --   PLT 182 175 196 183  --     Basic Metabolic Panel: Recent Labs  Lab 01/18/22 0352 01/19/22 0528 01/20/22 0800 01/22/22 0328 01/23/22 0402 01/24/22 0710  NA 137 139 139 138 135 136  K 4.6 4.7 5.0 4.6 4.3 4.5  CL 103 103 105 106 104 106  CO2 '29 29 28 25 23 23  '$ GLUCOSE 113* 115* 116* 106* 102* 107*  BUN 91* 99* 100* 115* 113* 101*  CREATININE 5.03* 5.07* 4.85* 4.34* 3.97* 3.76*  CALCIUM 9.5 9.9 9.7 9.9 9.3 9.9  MG 2.5* 2.6* 2.4  --  2.1  --   PHOS 2.2* 2.7 2.7  --  2.7  --     GFR: Estimated Creatinine Clearance: 15.1 mL/min (A) (  by C-G formula based on SCr of 3.76 mg/dL (H)). Liver Function Tests: Recent Labs  Lab 01/20/22 0800 01/23/22 0402  AST 15 20  ALT 6 12  ALKPHOS 63 76  BILITOT 0.5 0.6  PROT 5.2* 5.3*  ALBUMIN 2.2* 2.3*    No results for input(s): "LIPASE", "AMYLASE" in the last 168 hours. No results for input(s): "AMMONIA" in the last 168 hours. Coagulation Profile: No results for input(s): "INR", "PROTIME" in the last 168 hours. Cardiac Enzymes: No results for input(s): "CKTOTAL", "CKMB", "CKMBINDEX", "TROPONINI" in the last 168 hours. BNP (last 3 results) No results for input(s): "PROBNP" in the last 8760 hours. HbA1C: No results for input(s): "HGBA1C" in the last 72 hours. CBG: Recent Labs  Lab 01/21/22 0503 01/21/22 1144 01/21/22 1700 01/21/22 2305 01/22/22 0614  GLUCAP 116* 114* 130* 97 93     Lipid Profile: Recent Labs    01/23/22 0402  TRIG 69    Thyroid Function Tests: No results for input(s): "TSH", "T4TOTAL", "FREET4", "T3FREE", "THYROIDAB" in the last 72 hours. Anemia Panel: No results for input(s): "VITAMINB12", "FOLATE", "FERRITIN", "TIBC", "IRON", "RETICCTPCT" in the last 72 hours. Sepsis Labs: No results for input(s): "PROCALCITON", "LATICACIDVEN" in the last 168 hours.  No results found for this or any previous visit (from the past 240 hour(s)).       Radiology Studies: No results found.      Scheduled Meds:  Chlorhexidine Gluconate Cloth  6 each Topical Daily   furosemide  20 mg Intravenous BID   polyethylene glycol  17 g Oral BID   sodium chloride flush  10-40 mL Intracatheter Q12H   sodium chloride flush  3 mL Intravenous Q12H   Continuous Infusions:  sodium chloride Stopped (01/22/22 2041)   TPN ADULT (ION) 75 mL/hr at 01/23/22 1833   TPN ADULT (ION)       LOS: 20 days    Time spent: 35 minutes.     Elmarie Shiley, MD [facility]   If 7PM-7AM, please contact night-coverage [email or web address]  01/24/2022, 3:05 PM

## 2022-01-24 NOTE — Care Management Important Message (Signed)
Important Message  Patient Details  Name: Troy Berry. MRN: [id number] Date of Birth: 07/04/1945   Medicare Important Message Given:  Yes     [name] 01/24/2022, 2:55 PM

## 2022-01-25 DIAGNOSIS — N184 Chronic kidney disease, stage 4 (severe): Secondary | ICD-10-CM | POA: Diagnosis not present

## 2022-01-25 DIAGNOSIS — N179 Acute kidney failure, unspecified: Secondary | ICD-10-CM | POA: Diagnosis not present

## 2022-01-25 LAB — CBC
HCT: 21.8 % — ABNORMAL LOW (ref 39.0–52.0)
Hemoglobin: 7.1 g/dL — ABNORMAL LOW (ref 13.0–17.0)
MCH: 27.8 pg (ref 26.0–34.0)
MCHC: 32.6 g/dL (ref 30.0–36.0)
MCV: 85.5 fL (ref 80.0–100.0)
Platelets: 218 10*3/uL (ref 150–400)
RBC: 2.55 MIL/uL — ABNORMAL LOW (ref 4.22–5.81)
RDW: 16.2 % — ABNORMAL HIGH (ref 11.5–15.5)
WBC: 9.4 10*3/uL (ref 4.0–10.5)
nRBC: 0 % (ref 0.0–0.2)

## 2022-01-25 LAB — BASIC METABOLIC PANEL
Anion gap: 7 (ref 5–15)
BUN: 105 mg/dL — ABNORMAL HIGH (ref 8–23)
CO2: 21 mmol/L — ABNORMAL LOW (ref 22–32)
Calcium: 10 mg/dL (ref 8.9–10.3)
Chloride: 108 mmol/L (ref 98–111)
Creatinine, Ser: 3.7 mg/dL — ABNORMAL HIGH (ref 0.61–1.24)
GFR, Estimated: 16 mL/min — ABNORMAL LOW (ref >60)
Glucose, Bld: 116 mg/dL — ABNORMAL HIGH (ref 70–99)
Potassium: 4.3 mmol/L (ref 3.5–5.1)
Sodium: 136 mmol/L (ref 135–145)

## 2022-01-25 MED ORDER — TRACE MINERALS CU-MN-SE-ZN 300-55-60-3000 MCG/ML IV SOLN
INTRAVENOUS | Status: AC
  Filled 2022-01-25: qty 696

## 2022-01-25 NOTE — Progress Notes (Signed)
Patient ID: Troy Berry., male   DOB: 04-04-1946, 76 y.o.   MRN: [id number]     [facility] Day(s): 21.   Interval History: Patient seen and examined, no acute events or new complaints overnight. Continue having multiple bowel movement. As per nurse tech, bowel movements were large. No nausea or vomiting with full liquids.   Vital signs in last 24 hours: [min-max] current  Temp:  [97.8 F (36.6 C)-98.7 F (37.1 C)] 98.6 F (37 C) (10/21 0722) Pulse Rate:  [74-88] 74 (10/21 0722) Resp:  [17-18] 17 (10/21 0722) BP: (109-141)/(35-74) 138/74 (10/21 0845) SpO2:  [100 %] 100 % (10/21 0722)     Height: '5\' 9"'$  (175.3 cm) Weight: 64.7 kg BMI (Calculated): 21.05   Physical Exam:  Constitutional: alert, cooperative and no distress  Respiratory: breathing non-labored at rest  Cardiovascular: regular rate and sinus rhythm  Gastrointestinal: soft, non-tender, and non-distended  Labs:     Latest Ref Rng & Units 01/24/2022    7:10 AM 01/23/2022    4:02 AM 01/22/2022    3:28 AM  CBC  WBC 4.0 - 10.5 K/uL  8.3  8.1   Hemoglobin 13.0 - 17.0 g/dL 7.5  7.2  7.5   Hematocrit 39.0 - 52.0 % 23.8  22.8  23.4   Platelets 150 - 400 K/uL  183  196       Latest Ref Rng & Units 01/25/2022    5:55 AM 01/24/2022    7:10 AM 01/23/2022    4:02 AM  CMP  Glucose 70 - 99 mg/dL 116  107  102   BUN 8 - 23 mg/dL 105  101  113   Creatinine 0.61 - 1.24 mg/dL 3.70  3.76  3.97   Sodium 135 - 145 mmol/L 136  136  135   Potassium 3.5 - 5.1 mmol/L 4.3  4.5  4.3   Chloride 98 - 111 mmol/L 108  106  104   CO2 22 - 32 mmol/L '21  23  23   '$ Calcium 8.9 - 10.3 mg/dL 10.0  9.9  9.3   Total Protein 6.5 - 8.1 g/dL   5.3   Total Bilirubin 0.3 - 1.2 mg/dL   0.6   Alkaline Phos 38 - 126 U/L   76   AST 15 - 41 U/L   20   ALT 0 - 44 U/L   12     Imaging studies: No new pertinent imaging studies   Assessment/Plan:  76 y.o. male with small bowel obstruction 18 Day Post-Op s/p reduction of  internal hernia.  The was complicated by midline wound dehiscence status post wound closure postop day #15.       Prolonged postoperative ileus -Slowly resolving -abdomen continue soft today.  -NGT clamped for the last 3 days without vomiting. Multiple (+3 per day) bowel movements daily.  -Will removed NGT and advance diet to soft diet - Will assess for toleration - I will continue to follow closely.   Arnold Long, MD

## 2022-01-25 NOTE — Plan of Care (Signed)
  Problem: Education: Goal: Knowledge of General Education information will improve Description: Including pain rating scale, medication(s)/side effects and non-pharmacologic comfort measures Outcome: Progressing   Problem: Health Behavior/Discharge Planning: Goal: Ability to manage health-related needs will improve Outcome: Progressing   Problem: Clinical Measurements: Goal: Ability to maintain clinical measurements within normal limits will improve Outcome: Progressing Goal: Will remain free from infection Outcome: Progressing Goal: Diagnostic test results will improve Outcome: Progressing Goal: Respiratory complications will improve Outcome: Progressing Goal: Cardiovascular complication will be avoided Outcome: Progressing   Problem: Activity: Goal: Risk for activity intolerance will decrease Outcome: Progressing   Problem: Nutrition: Goal: Adequate nutrition will be maintained Outcome: Progressing   Problem: Coping: Goal: Level of anxiety will decrease Outcome: Progressing   Problem: Elimination: Goal: Will not experience complications related to bowel motility Outcome: Progressing Goal: Will not experience complications related to urinary retention Outcome: Progressing   Problem: Pain Managment: Goal: General experience of comfort will improve Outcome: Progressing   Problem: Safety: Goal: Ability to remain free from injury will improve Outcome: Progressing   Problem: Skin Integrity: Goal: Risk for impaired skin integrity will decrease Outcome: Progressing   Problem: Education: Goal: Understanding of discharge needs will improve Outcome: Progressing Goal: Verbalization of understanding of the causes of altered bowel function will improve Outcome: Progressing   Problem: Activity: Goal: Ability to tolerate increased activity will improve Outcome: Progressing   Problem: Bowel/Gastric: Goal: Gastrointestinal status for postoperative course will  improve Outcome: Progressing   Problem: Health Behavior/Discharge Planning: Goal: Identification of community resources to assist with postoperative recovery needs will improve Outcome: Progressing   Problem: Nutritional: Goal: Will attain and maintain optimal nutritional status will improve Outcome: Progressing   Problem: Clinical Measurements: Goal: Postoperative complications will be avoided or minimized Outcome: Progressing   Problem: Respiratory: Goal: Respiratory status will improve Outcome: Progressing   Problem: Skin Integrity: Goal: Will show signs of wound healing Outcome: Progressing   Problem: Education: Goal: Ability to describe self-care measures that may prevent or decrease complications (Diabetes Survival Skills Education) will improve Outcome: Progressing Goal: Individualized Educational Video(s) Outcome: Progressing   Problem: Coping: Goal: Ability to adjust to condition or change in health will improve Outcome: Progressing   Problem: Fluid Volume: Goal: Ability to maintain a balanced intake and output will improve Outcome: Progressing   Problem: Health Behavior/Discharge Planning: Goal: Ability to identify and utilize available resources and services will improve Outcome: Progressing Goal: Ability to manage health-related needs will improve Outcome: Progressing   Problem: Metabolic: Goal: Ability to maintain appropriate glucose levels will improve Outcome: Progressing   Problem: Nutritional: Goal: Maintenance of adequate nutrition will improve Outcome: Progressing Goal: Progress toward achieving an optimal weight will improve Outcome: Progressing   Problem: Skin Integrity: Goal: Risk for impaired skin integrity will decrease Outcome: Progressing   Problem: Tissue Perfusion: Goal: Adequacy of tissue perfusion will improve Outcome: Progressing

## 2022-01-25 NOTE — Consult Note (Signed)
PHARMACY - TOTAL PARENTERAL NUTRITION CONSULT NOTE   Indication: Prolonged ileus  Patient Measurements: Height: '5\' 9"'$  (175.3 cm) Weight: 64.7 kg (142 lb 10.2 oz) IBW/kg (Calculated) : 70.7 TPN AdjBW (KG): 73 Body mass index is 21.06 kg/m.   Assessment: 76 y.o. male with medical history significant of atonic bladder s/p suprapubic catheter, CKD Stage 4, anemia of chronic kidney disease, hypertension, gout found with small bowel obstruction 7 Day Post-Op s/p reduction of internal hernia.  This was complicated by midline wound dehiscence status post wound closure postop    Glucose / Insulin: SSI stopped, not required Electrolytes: electrolytes wnl  Renal: Scr 4.85-->3.7 (baseline 2.5-3, per nephrology no HD needed), BUN 70>>91 Hepatic: LFTs WNL Intake / Output; MIVF: -5.8L GI Imaging:  -CT 10/11: Persistent small bowel dilatation with fluid-filled loops.  -CT 10/9:  findings favor ileus versus a partial small bowel obstruction, GI Surgeries / Procedures:  -Ex Lap 10/6: closure of opened abdominal incision wound -Ex Lap 10/3: reduction of internal hernia   Central access: DL PICC placed 10/11 TPN start date: 10/11  Nutritional Goals: Goal TPN rate is 75 mL/hr (provides 104.4 g of protein and 2197 kcals per day)  RD Assessment: Estimated Needs Total Energy Estimated Needs: 1800-2200kcal/day Total Protein Estimated Needs: 90-105g/day Total Fluid Estimated Needs: 1.7-2.0L/day  Current Nutrition:  NPO  10/14: did not tolerate NGT clamp trial  Plan:  -- continue TPN at 75 ml/hr --nutritional components Amino acids (using 15% Clinisol) 104.4 grams Dextrose: 18.7% / 329.4 grams Lipids: 66 grams kCal: 2197 --Electrolytes in TPN: Na 40mq/L,  K 30 mEq/L, Ca 542m/L,  3 mEq/L and Phos 5 mmol/L. Cl:Ac 1:1 -on lasix '20mg'$  IV bid --Add standard MVI and trace elements to TPN, remove chromium d/t renal insufficiency --Monitor TPN labs on Mon/Thurs, and as needed until stable  Troy Berry  A Troy Berry 01/25/2022,8:54 AM

## 2022-01-25 NOTE — Progress Notes (Signed)
PROGRESS NOTE    Troy Berry.  WSF:[id number] DOB: 01-Jul-1945 DOA: 01/03/2022 PCP: [facility]   Brief Narrative: 76 year old with past medical history significant for atonic bladder status post suprapubic catheter, stage IV CKD, hypertension presents to the ED on 9/30 with complaint of suprapubic catheter dysfunction as well as abdominal distention.  Patient was found to have possible SBO versus ileus.  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  General surgery and urology were consulted.  Patient was a started on cefepime to cover for UTI.  NG tube placed for abdominal distention.  Patient did not improve with conservative management and underwent exploratory laparoscopy and reduction of internal hernia 10/3.  Patient was noted to have acute onset of M-End and serous drainage through day 1, wound dehiscence and omental exposure.  On 10/6 patient was taken to the OR for wound closure.  He was noted to have distended bladder despite suprapubic catheter.  Bloody urine was aspirated.  There is no evidence of bowel obstruction.  On 7/11 decision was made to start patient on TPN.  NG tube has been clamped, he had a bowel movement.  Started on clear diet.     Assessment & Plan:   Principal Problem:   Acute renal failure superimposed on stage 4 chronic kidney disease (HCC) Active Problems:   SBO (small bowel obstruction) (HCC)   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HCC)   Anemia   Hematuria   Urinary tract infection   Metabolic acidosis   Essential hypertension   Gross hematuria   Dementia without behavioral disturbance (HCC)   Hypokalemia   Acute blood loss anemia   Protein-calorie malnutrition, severe   Wound dehiscence   1-SBO -Status post reduction of ventral hernia complicated by midline wound  dehiscence he is status post wound closure. -CT shows some evidence of bowel dilation with no focal point demonstrated..  CT cystogram demonstrated persistent small bowel dilation with evidence of focal transition in the central mesentery.  Patient did not improve with conservative management.  Underwent lap and reduction of hernia 10/3. -TPN due to persistent ileus. Wean when tolerating diet  -NG tube has been removed. Diet advanced to Soft.  Discontinue morphine due to CKD. Low dose dilaudid PRN order    2-AKI on CKD stage IV Creatinine peaked to 4.8 compared to prior 3.5 Component of obstructive uropathy and suprapubic catheter dysfunction.  Urology replace suprapubic catheter. Also component of ATN.  Nephrology following, patient not felt to be a good long term candidate for dialysis.  Continue to have good urine output.  Creatinine trending down Monitor on IV lasix.  Cr down to 3.7  3-Suprapubic catheter dysfunction: History of atonic bladder requiring suprapubic catheter placed approximately 1 month ago 80/31/2023. Presented with hematuria and poor urinary drainage. Evaluated by urology 9/27, it was discovered that suprapubic catheter tip was not in the bladder but adjacent to the small bowel.  Urology consulted and suprapubic catheter replaced with cystogram demonstrating appropriate placement within the bladder. Hematuria persist. Monitor.   Anemia, acute blood loss anemia postsurgery, hematuria and combination of anemia of chronic disease Patient has had multiple blood transfusion total of 3 Received IV iron 10/18 Monitor hemoglobin  Hematuria secondary to suprapubic catheter dysfunction  Urinary tract infection secondary to obstructive uropathy.  Completed course of IV antibiotics Hyponatremia: Resolved  Metabolic acidosis in the  setting of CKD. resolved  Hypertension: Continue amlodipine carvedilol  Dementia : At risk for delirium.  He was agitated last night received  Haldol.  He is more alert today.  Delirium precaution.   Wound dehiscence Following exploratory laparotomy, wound dehisced requiring patient to go back to the OR on 10/6.  Surgery following   Protein-calorie malnutrition, severe Nutrition Status: Nutrition Problem: Severe Malnutrition Etiology: chronic illness (dementia, CKD, internal hernia) Signs/Symptoms: severe fat depletion, severe muscle depletion on TPN See nurse documentation.   Nutrition Problem: Severe Malnutrition Etiology: chronic illness (dementia, CKD, internal hernia)    Signs/Symptoms: severe fat depletion, severe muscle depletion       Estimated body mass index is 21.06 kg/m as calculated from the following:   Height as of this encounter: '5\' 9"'$  (1.753 m).   Weight as of this encounter: 64.7 kg.   DVT prophylaxis: SCD Code Status: Full code Family Communication: Wife at bedside 10/19.  Disposition Plan:  Status is: Inpatient Remains inpatient appropriate because: management of SBP post op complications.     Consultants:  Surgery Urology Nephrology  Palliative   Procedures:  Status post reduction of ventral hernia complicated by midline wound dehiscence he is status post wound closure.  Antimicrobials:    Subjective: He is alert, denies pain. Had BM  NG tube removed.   Objective: Vitals:   01/24/22 1934 01/25/22 0335 01/25/22 0722 01/25/22 0845  BP: (!) 141/74 (!) 141/64 (!) 109/35 138/74  Pulse: 88 83 74   Resp: '18 18 17   '$ Temp: 97.8 F (36.6 C) 98.4 F (36.9 C) 98.6 F (37 C)   TempSrc:      SpO2: 100% 100% 100% 98%  Weight:      Height:        Intake/Output Summary (Last 24 hours) at 01/25/2022 1520 Last data filed at 01/25/2022 9811 Gross per 24 hour  Intake --  Output 2050 ml  Net -2050 ml    Filed Weights   01/18/22 0405 01/21/22 0419 01/21/22 1134  Weight: 66.4 kg 62.2 kg 64.7 kg    Examination:  General exam: NAD Respiratory system: CTA Cardiovascular  system: S 1, S 2 RRR Gastrointestinal system: soft, nt, N d  mild line with staples.  Central nervous system: alert.  Extremities: No edema  Data Reviewed: I have personally reviewed following labs and imaging studies  CBC: Recent Labs  Lab 01/19/22 0528 01/20/22 0800 01/22/22 0328 01/23/22 0402 01/24/22 0710 01/25/22 1236  WBC 8.1 6.6 8.1 8.3  --  9.4  NEUTROABS 5.7 4.8  --   --   --   --   HGB 7.0* 6.3* 7.5* 7.2* 7.5* 7.1*  HCT 22.0* 19.9* 23.4* 22.8* 23.8* 21.8*  MCV 84.3 85.4 86.0 86.0  --  85.5  PLT 182 175 196 183  --  914    Basic Metabolic Panel: Recent Labs  Lab 01/19/22 0528 01/20/22 0800 01/22/22 0328 01/23/22 0402 01/24/22 0710 01/25/22 0555  NA 139 139 138 135 136 136  K 4.7 5.0 4.6 4.3 4.5 4.3  CL 103 105 106 104 106 108  CO2 '29 28 25 23 23 '$ 21*  GLUCOSE 115* 116* 106* 102* 107* 116*  BUN 99* 100* 115* 113* 101* 105*  CREATININE 5.07* 4.85* 4.34* 3.97* 3.76* 3.70*  CALCIUM 9.9 9.7 9.9 9.3 9.9 10.0  MG 2.6* 2.4  --  2.1  --   --   PHOS 2.7 2.7  --  2.7  --   --  GFR: Estimated Creatinine Clearance: 15.3 mL/min (A) (by C-G formula based on SCr of 3.7 mg/dL (H)). Liver Function Tests: Recent Labs  Lab 01/20/22 0800 01/23/22 0402  AST 15 20  ALT 6 12  ALKPHOS 63 76  BILITOT 0.5 0.6  PROT 5.2* 5.3*  ALBUMIN 2.2* 2.3*    No results for input(s): "LIPASE", "AMYLASE" in the last 168 hours. No results for input(s): "AMMONIA" in the last 168 hours. Coagulation Profile: No results for input(s): "INR", "PROTIME" in the last 168 hours. Cardiac Enzymes: No results for input(s): "CKTOTAL", "CKMB", "CKMBINDEX", "TROPONINI" in the last 168 hours. BNP (last 3 results) No results for input(s): "PROBNP" in the last 8760 hours. HbA1C: No results for input(s): "HGBA1C" in the last 72 hours. CBG: Recent Labs  Lab 01/21/22 0503 01/21/22 1144 01/21/22 1700 01/21/22 2305 01/22/22 0614  GLUCAP 116* 114* 130* 97 93    Lipid Profile: Recent Labs     01/23/22 0402  TRIG 69    Thyroid Function Tests: No results for input(s): "TSH", "T4TOTAL", "FREET4", "T3FREE", "THYROIDAB" in the last 72 hours. Anemia Panel: No results for input(s): "VITAMINB12", "FOLATE", "FERRITIN", "TIBC", "IRON", "RETICCTPCT" in the last 72 hours. Sepsis Labs: No results for input(s): "PROCALCITON", "LATICACIDVEN" in the last 168 hours.  No results found for this or any previous visit (from the past 240 hour(s)).       Radiology Studies: No results found.      Scheduled Meds:  Chlorhexidine Gluconate Cloth  6 each Topical Daily   furosemide  20 mg Intravenous BID   polyethylene glycol  17 g Oral BID   sodium chloride flush  10-40 mL Intracatheter Q12H   sodium chloride flush  3 mL Intravenous Q12H   Continuous Infusions:  sodium chloride Stopped (01/22/22 2041)   TPN ADULT (ION) 75 mL/hr at 01/24/22 1748   TPN ADULT (ION)       LOS: 21 days    Time spent: 35 minutes.     Elmarie Shiley, MD [facility]   If 7PM-7AM, please contact night-coverage [email or web address]  01/25/2022, 3:20 PM

## 2022-01-25 NOTE — Progress Notes (Signed)
End of shift note:  NG tube was taken out by surgery this morning and sitter order was DC. Family was updated about plan of care. Incontinence care was provided.

## 2022-01-25 NOTE — Progress Notes (Signed)
[facility]  PROGRESS NOTE   Subjective:   Patient is lethargic but arousable.  Objective:  Vital signs: Blood pressure (!) 141/63, pulse 77, temperature 98.8 F (37.1 C), temperature source Oral, resp. rate 12, height '5\' 9"'$  (1.753 m), weight 64.7 kg, SpO2 100 %.  Intake/Output Summary (Last 24 hours) at 01/25/2022 1611 Last data filed at 01/25/2022 0622 Gross per 24 hour  Intake --  Output 1250 ml  Net -1250 ml   Filed Weights   01/18/22 0405 01/21/22 0419 01/21/22 1134  Weight: 66.4 kg 62.2 kg 64.7 kg     Physical Exam: General:  No acute distress  Head:  Normocephalic, atraumatic. Moist oral mucosal membranes  Eyes:  Anicteric  Neck:  Supple  Lungs:   Clear to auscultation, normal effort  Heart:  S1S2 no rubs  Abdomen:   Soft, nontender, bowel sounds present  Extremities:  peripheral edema.  Neurologic: Lethargic  Skin:  No lesions  Access:     Basic Metabolic Panel: Recent Labs  Lab 01/19/22 0528 01/20/22 0800 01/22/22 0328 01/23/22 0402 01/24/22 0710 01/25/22 0555  NA 139 139 138 135 136 136  K 4.7 5.0 4.6 4.3 4.5 4.3  CL 103 105 106 104 106 108  CO2 '29 28 25 23 23 '$ 21*  GLUCOSE 115* 116* 106* 102* 107* 116*  BUN 99* 100* 115* 113* 101* 105*  CREATININE 5.07* 4.85* 4.34* 3.97* 3.76* 3.70*  CALCIUM 9.9 9.7 9.9 9.3 9.9 10.0  MG 2.6* 2.4  --  2.1  --   --   PHOS 2.7 2.7  --  2.7  --   --     CBC: Recent Labs  Lab 01/19/22 0528 01/20/22 0800 01/22/22 0328 01/23/22 0402 01/24/22 0710 01/25/22 1236  WBC 8.1 6.6 8.1 8.3  --  9.4  NEUTROABS 5.7 4.8  --   --   --   --   HGB 7.0* 6.3* 7.5* 7.2* 7.5* 7.1*  HCT 22.0* 19.9* 23.4* 22.8* 23.8* 21.8*  MCV 84.3 85.4 86.0 86.0  --  85.5  PLT 182 175 196 183  --  218     Urinalysis: No results for input(s): "COLORURINE", "LABSPEC", "PHURINE", "GLUCOSEU", "HGBUR", "BILIRUBINUR", "KETONESUR", "PROTEINUR", "UROBILINOGEN", "NITRITE", "LEUKOCYTESUR" in the last 72 hours.  Invalid input(s):  "APPERANCEUR"    Imaging: No results found.   Medications:    sodium chloride Stopped (01/22/22 2041)   TPN ADULT (ION) 75 mL/hr at 01/24/22 1748   TPN ADULT (ION)      Chlorhexidine Gluconate Cloth  6 each Topical Daily   furosemide  20 mg Intravenous BID   polyethylene glycol  17 g Oral BID   sodium chloride flush  10-40 mL Intracatheter Q12H   sodium chloride flush  3 mL Intravenous Q12H    Assessment/ Plan:     Principal Problem:   Acute renal failure superimposed on stage 4 chronic kidney disease (HCC) Active Problems:   Urinary tract infection   Anemia   Essential hypertension   Gross hematuria   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HCC)   Metabolic acidosis   Hematuria   SBO (small bowel obstruction) (HCC)   Dementia without behavioral disturbance (HCC)   Hypokalemia   Acute blood loss anemia   Protein-calorie malnutrition, severe   Wound dehiscence  Troy Berry. is a 76 y.o.  male with medical problems of atonic bladder status post suprapubic catheter placement, chronic kidney disease, anemia, hypertension, gout    was admitted on 01/03/2022 for  Acute on chronic renal failure (HCC) [N17.9, N18.9] Gross hematuria [R31.0]  #1: Acute kidney injury: Patient has history of stage IV CKD.  Now has acute kidney injury on the top of chronic kidney disease.  Most likely secondary to prerenal azotemia.  #2Johney Berry hematuria: S/p suprapubic catheter placement.  Patient has atonic bladder.  #3: Small bowel obstruction/postop ileus: S/p reduction of hernia complicated by wound dehiscence.  Presently on TPN.  #4: Anemia: Patient had multiple blood transfusions.  Continue supportive care.  Continue supportive care.    LOS: [place], [facility] 10/21/20234:11 PM

## 2022-01-26 DIAGNOSIS — N184 Chronic kidney disease, stage 4 (severe): Secondary | ICD-10-CM | POA: Diagnosis not present

## 2022-01-26 DIAGNOSIS — N179 Acute kidney failure, unspecified: Secondary | ICD-10-CM | POA: Diagnosis not present

## 2022-01-26 LAB — CBC
HCT: 23.6 % — ABNORMAL LOW (ref 39.0–52.0)
Hemoglobin: 7.7 g/dL — ABNORMAL LOW (ref 13.0–17.0)
MCH: 27.7 pg (ref 26.0–34.0)
MCHC: 32.6 g/dL (ref 30.0–36.0)
MCV: 84.9 fL (ref 80.0–100.0)
Platelets: 232 10*3/uL (ref 150–400)
RBC: 2.78 MIL/uL — ABNORMAL LOW (ref 4.22–5.81)
RDW: 16.3 % — ABNORMAL HIGH (ref 11.5–15.5)
WBC: 12 10*3/uL — ABNORMAL HIGH (ref 4.0–10.5)
nRBC: 0 % (ref 0.0–0.2)

## 2022-01-26 LAB — BASIC METABOLIC PANEL
Anion gap: 7 (ref 5–15)
BUN: 92 mg/dL — ABNORMAL HIGH (ref 8–23)
CO2: 20 mmol/L — ABNORMAL LOW (ref 22–32)
Calcium: 10.3 mg/dL (ref 8.9–10.3)
Chloride: 112 mmol/L — ABNORMAL HIGH (ref 98–111)
Creatinine, Ser: 3.75 mg/dL — ABNORMAL HIGH (ref 0.61–1.24)
GFR, Estimated: 16 mL/min — ABNORMAL LOW (ref >60)
Glucose, Bld: 118 mg/dL — ABNORMAL HIGH (ref 70–99)
Potassium: 4.4 mmol/L (ref 3.5–5.1)
Sodium: 139 mmol/L (ref 135–145)

## 2022-01-26 MED ORDER — TRACE MINERALS CU-MN-SE-ZN 300-55-60-3000 MCG/ML IV SOLN
INTRAVENOUS | Status: DC
  Filled 2022-01-26: qty 696

## 2022-01-26 NOTE — Progress Notes (Signed)
[facility]  PROGRESS NOTE   Subjective:   Lethargic but arousable.  Urine output is marginal, Foley catheter is still blood-tinged.  Objective:  Vital signs: Blood pressure (!) 105/57, pulse 71, temperature 98.1 F (36.7 C), temperature source Oral, resp. rate 17, height '5\' 9"'$  (1.753 m), weight 60.8 kg, SpO2 100 %.  Intake/Output Summary (Last 24 hours) at 01/26/2022 1107 Last data filed at 01/25/2022 1644 Gross per 24 hour  Intake --  Output 1075 ml  Net -1075 ml   Filed Weights   01/21/22 0419 01/21/22 1134 01/26/22 0500  Weight: 62.2 kg 64.7 kg 60.8 kg     Physical Exam: General:  No acute distress  Head:  Normocephalic, atraumatic. Moist oral mucosal membranes  Eyes:  Anicteric  Neck:  Supple  Lungs:   Clear to auscultation, normal effort  Heart:  S1S2 no rubs  Abdomen:   Soft, nontender, bowel sounds present  Extremities:  peripheral edema.  Neurologic: Lethargic  Skin:  No lesions  Access:     Basic Metabolic Panel: Recent Labs  Lab 01/20/22 0800 01/22/22 0328 01/23/22 0402 01/24/22 0710 01/25/22 0555 01/26/22 0521  NA 139 138 135 136 136 139  K 5.0 4.6 4.3 4.5 4.3 4.4  CL 105 106 104 106 108 112*  CO2 '28 25 23 23 '$ 21* 20*  GLUCOSE 116* 106* 102* 107* 116* 118*  BUN 100* 115* 113* 101* 105* 92*  CREATININE 4.85* 4.34* 3.97* 3.76* 3.70* 3.75*  CALCIUM 9.7 9.9 9.3 9.9 10.0 10.3  MG 2.4  --  2.1  --   --   --   PHOS 2.7  --  2.7  --   --   --     CBC: Recent Labs  Lab 01/20/22 0800 01/22/22 0328 01/23/22 0402 01/24/22 0710 01/25/22 1236 01/26/22 0521  WBC 6.6 8.1 8.3  --  9.4 12.0*  NEUTROABS 4.8  --   --   --   --   --   HGB 6.3* 7.5* 7.2* 7.5* 7.1* 7.7*  HCT 19.9* 23.4* 22.8* 23.8* 21.8* 23.6*  MCV 85.4 86.0 86.0  --  85.5 84.9  PLT 175 196 183  --  218 232     Urinalysis: No results for input(s): "COLORURINE", "LABSPEC", "PHURINE", "GLUCOSEU", "HGBUR", "BILIRUBINUR", "KETONESUR", "PROTEINUR", "UROBILINOGEN", "NITRITE",  "LEUKOCYTESUR" in the last 72 hours.  Invalid input(s): "APPERANCEUR"    Imaging: No results found.   Medications:    sodium chloride Stopped (01/22/22 2041)   TPN ADULT (ION) 75 mL/hr at 01/25/22 1648   TPN ADULT (ION)      Chlorhexidine Gluconate Cloth  6 each Topical Daily   furosemide  20 mg Intravenous BID   polyethylene glycol  17 g Oral BID   sodium chloride flush  10-40 mL Intracatheter Q12H   sodium chloride flush  3 mL Intravenous Q12H    Assessment/ Plan:     Principal Problem:   Acute renal failure superimposed on stage 4 chronic kidney disease (HCC) Active Problems:   Urinary tract infection   Anemia   Essential hypertension   Gross hematuria   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HCC)   Metabolic acidosis   Hematuria   SBO (small bowel obstruction) (HCC)   Dementia without behavioral disturbance (HCC)   Hypokalemia   Acute blood loss anemia   Protein-calorie malnutrition, severe   Wound dehiscence  Troy Berry. is a 76 y.o.  male with medical problems of atonic bladder status post suprapubic catheter placement,  chronic kidney disease, anemia, hypertension, gout    was admitted on 01/03/2022 for Acute on chronic renal failure ([place]) [N17.9, N18.9] Gross hematuria [R31.0]   #1: Acute kidney injury: Patient has history of stage IV CKD.  Now has acute kidney injury on the top of chronic kidney disease.  Most likely secondary to prerenal azotemia.   #2[name] hematuria: S/p suprapubic catheter placement.  Patient has atonic bladder.   #3: Small bowel obstruction/postop ileus: S/p reduction of hernia complicated by wound dehiscence.  Presently on TPN.   #4: Anemia: Patient had multiple blood transfusions.  Continue supportive care.   Continue supportive care.   LOS: [place], [facility] 10/22/202311:07 AM

## 2022-01-26 NOTE — Consult Note (Signed)
PHARMACY - TOTAL PARENTERAL NUTRITION CONSULT NOTE   Indication: Prolonged ileus  Patient Measurements: Height: '5\' 9"'$  (175.3 cm) Weight: 60.8 kg (134 lb 0.6 oz) IBW/kg (Calculated) : 70.7 TPN AdjBW (KG): 73 Body mass index is 19.79 kg/m.   Assessment: 76 y.o. male with medical history significant of atonic bladder s/p suprapubic catheter, CKD Stage 4, anemia of chronic kidney disease, hypertension, gout found with small bowel obstruction 7 Day Post-Op s/p reduction of internal hernia.  This was complicated by midline wound dehiscence status post wound closure postop    Glucose / Insulin: SSI stopped, not required Electrolytes: electrolytes wnl  Renal: Scr 4.85-->3.7 (baseline 2.5-3, per nephrology no HD needed), BUN 70>>91 Hepatic: LFTs WNL Intake / Output; MIVF: -5.8L GI Imaging:  -CT 10/11: Persistent small bowel dilatation with fluid-filled loops.  -CT 10/9:  findings favor ileus versus a partial small bowel obstruction, GI Surgeries / Procedures:  -Ex Lap 10/6: closure of opened abdominal incision wound -Ex Lap 10/3: reduction of internal hernia   Central access: DL PICC placed 10/11 TPN start date: 10/11  Nutritional Goals: Goal TPN rate is 75 mL/hr (provides 104.4 g of protein and 2197 kcals per day)  RD Assessment: Estimated Needs Total Energy Estimated Needs: 1800-2200kcal/day Total Protein Estimated Needs: 90-105g/day Total Fluid Estimated Needs: 1.7-2.0L/day  Current Nutrition:  NPO  10/14: did not tolerate NGT clamp trial  Plan:  -- continue TPN at 75 ml/hr --nutritional components Amino acids (using 15% Clinisol) 104.4 grams Dextrose: 18.7% / 329.4 grams Lipids: 66 grams kCal: 2197 --Electrolytes in TPN: Na 57mq/L,  K 30 mEq/L, Ca 529m/L,  3 mEq/L and Phos 5 mmol/L. Cl:Ac 1:1 -on lasix '20mg'$  IV bid --Add standard MVI and trace elements to TPN, remove chromium d/t renal insufficiency --Monitor TPN labs on Mon/Thurs, and as needed until stable  Troy Berry A  Troy Berry 01/26/2022,8:50 AM

## 2022-01-26 NOTE — Progress Notes (Signed)
PROGRESS NOTE    Troy Berry.  STM:[id number] DOB: December 28, 1945 DOA: 01/03/2022 PCP: [facility]   Brief Narrative: 76 year old with past medical history significant for atonic bladder status post suprapubic catheter, stage IV CKD, hypertension presents to the ED on 9/30 with complaint of suprapubic catheter dysfunction as well as abdominal distention.  Patient was found to have possible SBO versus ileus.  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  General surgery and urology were consulted.  Patient was a started on cefepime to cover for UTI.  NG tube placed for abdominal distention.  Patient did not improve with conservative management and underwent exploratory laparoscopy and reduction of internal hernia 10/3.  Patient was noted to have acute onset of M-End and serous drainage through day 1, wound dehiscence and omental exposure.  On 10/6 patient was taken to the OR for wound closure.  He was noted to have distended bladder despite suprapubic catheter.  Bloody urine was aspirated.  There is no evidence of bowel obstruction.  On 7/11 decision was made to start patient on TPN.  NG tube has been clamped, he had a bowel movement.  Started on clear diet.     Assessment & Plan:   Principal Problem:   Acute renal failure superimposed on stage 4 chronic kidney disease (HCC) Active Problems:   SBO (small bowel obstruction) (HCC)   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HCC)   Anemia   Hematuria   Urinary tract infection   Metabolic acidosis   Essential hypertension   Gross hematuria   Dementia without behavioral disturbance (HCC)   Hypokalemia   Acute blood loss anemia   Protein-calorie malnutrition, severe   Wound dehiscence   1-SBO -Status post reduction of ventral hernia complicated by midline wound  dehiscence he is status post wound closure. -CT shows some evidence of bowel dilation with no focal point demonstrated..  CT cystogram demonstrated persistent small bowel dilation with evidence of focal transition in the central mesentery.  Patient did not improve with conservative management.  Underwent lap and reduction of hernia 10/3. -TPN due to persistent ileus. Wean when tolerating diet  -NG tube has been removed. Diet advanced to Soft.  Discontinue morphine due to CKD. Low dose dilaudid PRN order    2-AKI on CKD stage IV Creatinine peaked to 4.8 compared to prior 3.5 Component of obstructive uropathy and suprapubic catheter dysfunction.  Urology replace suprapubic catheter. Also component of ATN.  Nephrology following, patient not felt to be a good long term candidate for dialysis.  Continue to have good urine output.  Creatinine trending down Monitor on IV lasix.  Cr down to 3.7  3-Suprapubic catheter dysfunction: History of atonic bladder requiring suprapubic catheter placed approximately 1 month ago 80/31/2023. Presented with hematuria and poor urinary drainage. Evaluated by urology 9/27, it was discovered that suprapubic catheter tip was not in the bladder but adjacent to the small bowel.  Urology consulted and suprapubic catheter replaced with cystogram demonstrating appropriate placement within the bladder. Hematuria persist. Monitor. Hb stable today   Anemia, acute blood loss anemia postsurgery, hematuria and combination of anemia of chronic disease Patient has had multiple blood transfusion total of 3 Received IV iron 10/18 Monitor hemoglobin  Hematuria secondary to suprapubic catheter dysfunction  Urinary tract infection secondary to obstructive uropathy.  Completed course of IV antibiotics Hyponatremia: Resolved  Metabolic  acidosis in the setting of CKD. resolved  Hypertension: Continue amlodipine carvedilol  Dementia : At risk for delirium.   Delirium precaution.    Wound dehiscence Following exploratory laparotomy, wound dehisced requiring patient to go back to the OR on 10/6.  Surgery following   Protein-calorie malnutrition, severe Nutrition Status: Nutrition Problem: Severe Malnutrition Etiology: chronic illness (dementia, CKD, internal hernia) Signs/Symptoms: severe fat depletion, severe muscle depletion on TPN See nurse documentation.   Nutrition Problem: Severe Malnutrition Etiology: chronic illness (dementia, CKD, internal hernia)    Signs/Symptoms: severe fat depletion, severe muscle depletion       Estimated body mass index is 19.79 kg/m as calculated from the following:   Height as of this encounter: '5\' 9"'$  (1.753 m).   Weight as of this encounter: 60.8 kg.   DVT prophylaxis: SCD Code Status: Full code Family Communication: Wife at bedside 10/19.  Disposition Plan:  Status is: Inpatient Remains inpatient appropriate because: management of SBP post op complications.     Consultants:  Surgery Urology Nephrology  Palliative   Procedures:  Status post reduction of ventral hernia complicated by midline wound dehiscence he is status post wound closure.  Antimicrobials:    Subjective: He is sleepy.   Objective: Vitals:   01/25/22 1609 01/25/22 2025 01/26/22 0500 01/26/22 0906  BP: (!) 141/63 138/69  (!) 105/57  Pulse: 77 77  71  Resp: '12 17  15  '$ Temp: 98.8 F (37.1 C) 98.6 F (37 C)  98.1 F (36.7 C)  TempSrc: Oral Oral  Oral  SpO2: 100% 99%  100%  Weight:   60.8 kg   Height:        Intake/Output Summary (Last 24 hours) at 01/26/2022 1444 Last data filed at 01/25/2022 1644 Gross per 24 hour  Intake --  Output 1075 ml  Net -1075 ml    Filed Weights   01/21/22 0419 01/21/22 1134 01/26/22 0500  Weight: 62.2 kg 64.7 kg 60.8 kg    Examination:  General exam: NAD Respiratory system: CTA Cardiovascular system: S 1, S 2 RRR Gastrointestinal system: BS present, soft, nt mild line with staples.   Central nervous system: sleepy  Extremities: No edema  Data Reviewed: I have personally reviewed following labs and imaging studies  CBC: Recent Labs  Lab 01/20/22 0800 01/22/22 0328 01/23/22 0402 01/24/22 0710 01/25/22 1236 01/26/22 0521  WBC 6.6 8.1 8.3  --  9.4 12.0*  NEUTROABS 4.8  --   --   --   --   --   HGB 6.3* 7.5* 7.2* 7.5* 7.1* 7.7*  HCT 19.9* 23.4* 22.8* 23.8* 21.8* 23.6*  MCV 85.4 86.0 86.0  --  85.5 84.9  PLT 175 196 183  --  218 546    Basic Metabolic Panel: Recent Labs  Lab 01/20/22 0800 01/22/22 0328 01/23/22 0402 01/24/22 0710 01/25/22 0555 01/26/22 0521  NA 139 138 135 136 136 139  K 5.0 4.6 4.3 4.5 4.3 4.4  CL 105 106 104 106 108 112*  CO2 '28 25 23 23 '$ 21* 20*  GLUCOSE 116* 106* 102* 107* 116* 118*  BUN 100* 115* 113* 101* 105* 92*  CREATININE 4.85* 4.34* 3.97* 3.76* 3.70* 3.75*  CALCIUM 9.7 9.9 9.3 9.9 10.0 10.3  MG 2.4  --  2.1  --   --   --   PHOS 2.7  --  2.7  --   --   --     GFR: Estimated Creatinine Clearance: 14.2 mL/min (A) (by C-G formula  based on SCr of 3.75 mg/dL (H)). Liver Function Tests: Recent Labs  Lab 01/20/22 0800 01/23/22 0402  AST 15 20  ALT 6 12  ALKPHOS 63 76  BILITOT 0.5 0.6  PROT 5.2* 5.3*  ALBUMIN 2.2* 2.3*    No results for input(s): "LIPASE", "AMYLASE" in the last 168 hours. No results for input(s): "AMMONIA" in the last 168 hours. Coagulation Profile: No results for input(s): "INR", "PROTIME" in the last 168 hours. Cardiac Enzymes: No results for input(s): "CKTOTAL", "CKMB", "CKMBINDEX", "TROPONINI" in the last 168 hours. BNP (last 3 results) No results for input(s): "PROBNP" in the last 8760 hours. HbA1C: No results for input(s): "HGBA1C" in the last 72 hours. CBG: Recent Labs  Lab 01/21/22 0503 01/21/22 1144 01/21/22 1700 01/21/22 2305 01/22/22 0614  GLUCAP 116* 114* 130* 97 93    Lipid Profile: No results for input(s): "CHOL", "HDL", "LDLCALC", "TRIG", "CHOLHDL", "LDLDIRECT" in the  last 72 hours.  Thyroid Function Tests: No results for input(s): "TSH", "T4TOTAL", "FREET4", "T3FREE", "THYROIDAB" in the last 72 hours. Anemia Panel: No results for input(s): "VITAMINB12", "FOLATE", "FERRITIN", "TIBC", "IRON", "RETICCTPCT" in the last 72 hours. Sepsis Labs: No results for input(s): "PROCALCITON", "LATICACIDVEN" in the last 168 hours.  No results found for this or any previous visit (from the past 240 hour(s)).       Radiology Studies: No results found.      Scheduled Meds:  Chlorhexidine Gluconate Cloth  6 each Topical Daily   furosemide  20 mg Intravenous BID   polyethylene glycol  17 g Oral BID   sodium chloride flush  10-40 mL Intracatheter Q12H   sodium chloride flush  3 mL Intravenous Q12H   Continuous Infusions:  sodium chloride Stopped (01/22/22 2041)   TPN ADULT (ION) 75 mL/hr at 01/25/22 1648   TPN ADULT (ION)       LOS: 22 days    Time spent: 35 minutes.     Elmarie Shiley, MD [facility]   If 7PM-7AM, please contact night-coverage [email or web address]  01/26/2022, 2:44 PM

## 2022-01-26 NOTE — Progress Notes (Signed)
Patient ID: Troy Berry., male   DOB: July 25, 1945, 76 y.o.   MRN: [id number]     [place] [facility] Day(s): 22.   Interval History: Patient seen and examined. No issues overnight. As per nurse, no nausea or vomiting. Small bowel movement yesterday.   Vital signs in last 24 hours: [min-max] current  Temp:  [98.6 F (37 C)-98.8 F (37.1 C)] 98.6 F (37 C) (10/21 2025) Pulse Rate:  [77] 77 (10/21 2025) Resp:  [12-17] 17 (10/21 2025) BP: (138-141)/(63-74) 138/69 (10/21 2025) SpO2:  [98 %-100 %] 99 % (10/21 2025) Weight:  [60.8 kg] 60.8 kg (10/22 0500)     Height: '5\' 9"'$  (175.3 cm) Weight: 60.8 kg BMI (Calculated): 19.79   Physical Exam:  Constitutional: alert, cooperative and no distress  Respiratory: breathing non-labored at rest  Cardiovascular: regular rate and sinus rhythm  Gastrointestinal: soft, non-tender, and non-distended  Labs:     Latest Ref Rng & Units 01/26/2022    5:21 AM 01/25/2022   12:36 PM 01/24/2022    7:10 AM  CBC  WBC 4.0 - 10.5 K/uL 12.0  9.4    Hemoglobin 13.0 - 17.0 g/dL 7.7  7.1  7.5   Hematocrit 39.0 - 52.0 % 23.6  21.8  23.8   Platelets 150 - 400 K/uL 232  218        Latest Ref Rng & Units 01/26/2022    5:21 AM 01/25/2022    5:55 AM 01/24/2022    7:10 AM  CMP  Glucose 70 - 99 mg/dL 118  116  107   BUN 8 - 23 mg/dL 92  105  101   Creatinine 0.61 - 1.24 mg/dL 3.75  3.70  3.76   Sodium 135 - 145 mmol/L 139  136  136   Potassium 3.5 - 5.1 mmol/L 4.4  4.3  4.5   Chloride 98 - 111 mmol/L 112  108  106   CO2 22 - 32 mmol/L '20  21  23   '$ Calcium 8.9 - 10.3 mg/dL 10.3  10.0  9.9     Imaging studies: No new pertinent imaging studies   Assessment/Plan:  76 y.o. male with small bowel obstruction 19 Day Post-Op s/p reduction of internal hernia.  The was complicated by midline wound dehiscence status post wound closure postop day #16.       Prolonged postoperative ileus -Resolving. No sign of vomiting -Hopefully will continue  with soft abdomen and having bowel movements.  -Continue soft diet -Continue medical management  -If no clinical deterioratio in the next 24-48 hours will be reasonable to start discharge planning.  -No contraindication for PT  Arnold Long, MD

## 2022-01-26 NOTE — Progress Notes (Signed)
Patient having bloody urine with small clots this shift. Patient found rolling in bed several times this shift and pulling on suprapubic catheter.

## 2022-01-27 DIAGNOSIS — R319 Hematuria, unspecified: Secondary | ICD-10-CM | POA: Diagnosis not present

## 2022-01-27 DIAGNOSIS — K560 Paralytic ileus: Secondary | ICD-10-CM | POA: Diagnosis not present

## 2022-01-27 DIAGNOSIS — N184 Chronic kidney disease, stage 4 (severe): Secondary | ICD-10-CM | POA: Diagnosis not present

## 2022-01-27 DIAGNOSIS — F0390 Unspecified dementia without behavioral disturbance: Secondary | ICD-10-CM | POA: Diagnosis not present

## 2022-01-27 DIAGNOSIS — N179 Acute kidney failure, unspecified: Secondary | ICD-10-CM | POA: Diagnosis not present

## 2022-01-27 LAB — CBC
HCT: 22.5 % — ABNORMAL LOW (ref 39.0–52.0)
Hemoglobin: 7.1 g/dL — ABNORMAL LOW (ref 13.0–17.0)
MCH: 27.2 pg (ref 26.0–34.0)
MCHC: 31.6 g/dL (ref 30.0–36.0)
MCV: 86.2 fL (ref 80.0–100.0)
Platelets: 223 10*3/uL (ref 150–400)
RBC: 2.61 MIL/uL — ABNORMAL LOW (ref 4.22–5.81)
RDW: 16.7 % — ABNORMAL HIGH (ref 11.5–15.5)
WBC: 9.7 10*3/uL (ref 4.0–10.5)
nRBC: 0 % (ref 0.0–0.2)

## 2022-01-27 LAB — COMPREHENSIVE METABOLIC PANEL
ALT: 12 U/L (ref 0–44)
AST: 28 U/L (ref 15–41)
Albumin: 2.4 g/dL — ABNORMAL LOW (ref 3.5–5.0)
Alkaline Phosphatase: 66 U/L (ref 38–126)
Anion gap: 9 (ref 5–15)
BUN: 114 mg/dL — ABNORMAL HIGH (ref 8–23)
CO2: 20 mmol/L — ABNORMAL LOW (ref 22–32)
Calcium: 10.5 mg/dL — ABNORMAL HIGH (ref 8.9–10.3)
Chloride: 110 mmol/L (ref 98–111)
Creatinine, Ser: 4.09 mg/dL — ABNORMAL HIGH (ref 0.61–1.24)
GFR, Estimated: 14 mL/min — ABNORMAL LOW (ref >60)
Glucose, Bld: 101 mg/dL — ABNORMAL HIGH (ref 70–99)
Potassium: 4.3 mmol/L (ref 3.5–5.1)
Sodium: 139 mmol/L (ref 135–145)
Total Bilirubin: 0.2 mg/dL — ABNORMAL LOW (ref 0.3–1.2)
Total Protein: 5.8 g/dL — ABNORMAL LOW (ref 6.5–8.1)

## 2022-01-27 LAB — PHOSPHORUS: Phosphorus: 2.8 mg/dL (ref 2.5–4.6)

## 2022-01-27 LAB — TRIGLYCERIDES
Triglycerides: 859 mg/dL — ABNORMAL HIGH (ref <150)
Triglycerides: 50 mg/dL (ref <150)

## 2022-01-27 LAB — MAGNESIUM: Magnesium: 2 mg/dL (ref 1.7–2.4)

## 2022-01-27 MED ORDER — FUROSEMIDE 10 MG/ML IJ SOLN
20.0000 mg | Freq: Every day | INTRAMUSCULAR | Status: DC
  Administered 2022-01-28 – 2022-01-31 (×4): 20 mg via INTRAVENOUS
  Filled 2022-01-27 (×4): qty 4

## 2022-01-27 MED ORDER — TRACE MINERALS CU-MN-SE-ZN 300-55-60-3000 MCG/ML IV SOLN
INTRAVENOUS | Status: AC
  Filled 2022-01-27: qty 371.2

## 2022-01-27 NOTE — Progress Notes (Signed)
[facility]  PROGRESS NOTE   Subjective:   Patient seen resting quietly, wife at bedside Continues receiving TPN however tolerating oral nutrition.  Appetite remains poor Suprapubic catheter in place with gross hematuria  Objective:  Vital signs: Blood pressure (!) 155/61, pulse 84, temperature 98.4 F (36.9 C), resp. rate 18, height '5\' 9"'$  (1.753 m), weight 60.8 kg, SpO2 100 %.  Intake/Output Summary (Last 24 hours) at 01/27/2022 1457 Last data filed at 01/27/2022 1410 Gross per 24 hour  Intake 2854.73 ml  Output 2250 ml  Net 604.73 ml    Filed Weights   01/21/22 1134 01/26/22 0500 01/27/22 0500  Weight: 64.7 kg 60.8 kg 60.8 kg     Physical Exam: General:  No acute distress  Head:  Normocephalic, atraumatic. Moist oral mucosal membranes  Eyes:  Anicteric  Lungs:   Clear to auscultation, normal effort  Heart:  S1S2 no rubs  Abdomen:   Soft, nontender, bowel sounds present  Extremities: Trace peripheral edema.  Neurologic: Lethargic  Skin:  No lesions  Access: None    Basic Metabolic Panel: Recent Labs  Lab 01/23/22 0402 01/24/22 0710 01/25/22 0555 01/26/22 0521 01/27/22 0701  NA 135 136 136 139 139  K 4.3 4.5 4.3 4.4 4.3  CL 104 106 108 112* 110  CO2 23 23 21* 20* 20*  GLUCOSE 102* 107* 116* 118* 101*  BUN 113* 101* 105* 92* 114*  CREATININE 3.97* 3.76* 3.70* 3.75* 4.09*  CALCIUM 9.3 9.9 10.0 10.3 10.5*  MG 2.1  --   --   --  2.0  PHOS 2.7  --   --   --  2.8     CBC: Recent Labs  Lab 01/22/22 0328 01/23/22 0402 01/24/22 0710 01/25/22 1236 01/26/22 0521 01/27/22 0706  WBC 8.1 8.3  --  9.4 12.0* 9.7  HGB 7.5* 7.2* 7.5* 7.1* 7.7* 7.1*  HCT 23.4* 22.8* 23.8* 21.8* 23.6* 22.5*  MCV 86.0 86.0  --  85.5 84.9 86.2  PLT 196 183  --  218 232 223      Urinalysis: No results for input(s): "COLORURINE", "LABSPEC", "PHURINE", "GLUCOSEU", "HGBUR", "BILIRUBINUR", "KETONESUR", "PROTEINUR", "UROBILINOGEN", "NITRITE", "LEUKOCYTESUR" in the  last 72 hours.  Invalid input(s): "APPERANCEUR"    Imaging: No results found.   Medications:    sodium chloride Stopped (01/22/22 2041)   TPN ADULT (ION) 40 mL/hr at 01/27/22 1145    Chlorhexidine Gluconate Cloth  6 each Topical Daily   [START ON 01/28/2022] furosemide  20 mg Intravenous Daily   polyethylene glycol  17 g Oral BID   sodium chloride flush  10-40 mL Intracatheter Q12H   sodium chloride flush  3 mL Intravenous Q12H    Assessment/ Plan:     Principal Problem:   Acute renal failure superimposed on stage 4 chronic kidney disease (HCC) Active Problems:   Urinary tract infection   Anemia   Essential hypertension   Gross hematuria   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HCC)   Metabolic acidosis   Hematuria   SBO (small bowel obstruction) (HCC)   Dementia without behavioral disturbance (HCC)   Hypokalemia   Acute blood loss anemia   Protein-calorie malnutrition, severe   Wound dehiscence  [name] is a 76 y.o.  male with medical problems of atonic bladder status post suprapubic catheter placement, chronic kidney disease, anemia, hypertension, gout    was admitted on 01/03/2022 for Acute on chronic renal failure ([place]) [N17.9, N18.9] Gross hematuria [R31.0]   #1: Acute  kidney injury with chronic kidney disease stage IV:  Most likely secondary to prerenal azotemia.  Patient has experienced fluctuating renal function during this admission.  Has maintained adequate urine output.  No acute need for dialysis at this time and we feel patient will make a poor long-term dialysis patient.  We will continue to monitor but may not visit patient daily   #2: Gross hematuria: S/p suprapubic catheter placement.  Patient has atonic bladder.   #3: Small bowel obstruction/postop ileus: S/p reduction of hernia complicated by wound dehiscence.  Presently on TPN.   #4: Anemia with chronic kidney disease: Patient had multiple blood transfusions.  Continue supportive  care.    LOS: [place], MD [facility] 10/23/20232:57 PM

## 2022-01-27 NOTE — Evaluation (Addendum)
Occupational Therapy Re-Evaluation Patient Details Name: Troy Berry. MRN: [id number] DOB: [birth year] Today's Date: 01/27/2022   History of Present Illness Patient is a 76 y.o.  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 He was admitted on 01/03/2022 for Acute on chronic renal failure, small bowel obstruction  s/p reduction of internal hernia. patient with midline wound dehiscence status post wound closure on 10/6.   Clinical Impression   Patient seen for re-evaluation. Chart reviewed to date, pt received supine in bed with wife present. Both agreeable to OT. Reviewed goals and updated based on progress. PTA pt lived with wife who assisted with ADLs/IADLs as needed. Pt required Max A for supine <> sit, Min-Mod A to maintain sitting balance at EOB, and Max A for LB dressing. Pt able to tolerate sitting EOB for ~5 min this date to complete grooming tasks and BUE AAROM exercises. Pt left as received with all needs in reach. Mitts donned for safety. Pt continues to benefit from skilled OT to maximize safety and independence. Upon hospital discharge, recommend STR to maximize pt safety and return to PLOF.        Recommendations for follow up therapy are one component of a multi-disciplinary discharge planning process, led by the attending physician.  Recommendations may be updated based on patient status, additional functional criteria and insurance authorization.   Follow Up Recommendations  Skilled nursing-short term rehab (<3 hours/day)    Assistance Recommended at Discharge Frequent or constant Supervision/Assistance  Patient can return home with the following A lot of help with walking and/or transfers;A lot of help with bathing/dressing/bathroom;Assistance with feeding;Assistance with cooking/housework;Direct supervision/assist for medications management;Direct supervision/assist for financial  management;Assist for transportation    Functional Status Assessment  Patient has had a recent decline in their functional status and demonstrates the ability to make significant improvements in function in a reasonable and predictable amount of time.  Equipment Recommendations  None recommended by OT    Recommendations for Other Services       Precautions / Restrictions Precautions Precautions: Fall Precaution Comments: suprapubic catheter; abdominal incision Restrictions Weight Bearing Restrictions: No      Mobility Bed Mobility Overal bed mobility: Needs Assistance Bed Mobility: Supine to Sit, Sit to Supine Rolling: Max assist   Supine to sit: Max assist, HOB elevated Sit to supine: Max assist, HOB elevated   General bed mobility comments: Assist for trunk and B LEs VC for technique, assist to scoot hips towards EOB using chuck pad.    Transfers                          Balance Overall balance assessment: Needs assistance Sitting-balance support: Bilateral upper extremity supported, Feet supported Sitting balance-[name]: Fair Sitting balance - Comments: Able to tolerate sitting EOB for ~5 min this date with Min-Mod A to maintain sitting balance Postural control: Right lateral lean                                 ADL either performed or assessed with clinical judgement   ADL Overall ADL's : Needs assistance/impaired     Grooming: Wash/dry face;Sitting;Set up;Supervision/safety;Cueing for sequencing Grooming Details (indicate cue type and reason): increased time and VC for reaching all areas of face             Lower Body  Dressing: Maximal assistance;Sitting/lateral leans Lower Body Dressing Details (indicate cue type and reason): for socks                     Vision Patient Visual Report: No change from baseline       Perception     Praxis      Pertinent Vitals/Pain Pain Assessment Pain Assessment: No/denies  pain     Hand Dominance     Extremity/Trunk Assessment Upper Extremity Assessment Upper Extremity Assessment: Generalized weakness;Difficult to assess due to impaired cognition   Lower Extremity Assessment Lower Extremity Assessment: Generalized weakness;Difficult to assess due to impaired cognition   Cervical / Trunk Assessment Cervical / Trunk Assessment: Other exceptions Cervical / Trunk Exceptions: forward head/shoulders   Communication Communication Communication: HOH   Cognition Arousal/Alertness: Awake/alert Behavior During Therapy: Flat affect Overall Cognitive Status: Impaired/Different from baseline Area of Impairment: Following commands, Problem solving, Attention, Safety/judgement, Orientation                 Orientation Level: Disoriented to, Place, Time, Situation, Person (Oriented to name only) Current Attention Level: Selective   Following Commands: Follows one step commands inconsistently Safety/Judgement: Decreased awareness of safety, Decreased awareness of deficits   Problem Solving: Slow processing, Decreased initiation, Difficulty sequencing, Requires verbal cues, Requires tactile cues General Comments: B mitts in place     General Comments  no drainage noted from abdominal incision; dark red blood noted in foley catheter bag prior to therapy activity (MD present during session and notified)    Exercises Other Exercises Other Exercises: BUE AAROM exercises while sitting EOB, unable to initiate after verbal instruction and visual demonstration, required hand over hand assistance to complete   Shoulder Instructions      Home Living Family/patient expects to be discharged to:: Private residence Living Arrangements: Spouse/significant other Available Help at Discharge: Family;Available 24 hours/day Type of Home: House Home Access: Level entry     Home Layout: One level     Bathroom Shower/Tub: Occupational psychologist: Standard      Home Equipment: Conservation officer, nature (2 wheels);BSC/3in1          Prior Functioning/Environment Prior Level of Function : Needs assist             Mobility Comments: Ambulatory with RW modified independently baseline ADLs Comments: Per previous notes "Per spouse in room, pt getting sponge bath in bed and needs assistance with self care tasks. Wife performs all IADLs. She has to cue him for sequencing and initiation at baseline."        OT Problem List: Decreased strength;Decreased activity tolerance;Impaired balance (sitting and/or standing);Decreased knowledge of use of DME or AE;Decreased safety awareness;Decreased range of motion;Decreased cognition      OT Treatment/Interventions: Self-care/ADL training;Therapeutic exercise;Therapeutic activities;Energy conservation;Manual therapy;Balance training;Patient/family education;DME and/or AE instruction    OT Goals(Current goals can be found in the care plan section) Acute Rehab OT Goals Patient Stated Goal: to get stronger OT Goal Formulation: With family Time For Goal Achievement: 02/10/22 Potential to Achieve Goals: Fair  OT Frequency: Min 2X/week    Co-evaluation              AM-PAC OT "6 Clicks" Daily Activity     Outcome Measure Help from another person eating meals?: A Little Help from another person taking care of personal grooming?: A Lot Help from another person toileting, which includes using toliet, bedpan, or urinal?: A Lot Help from another person bathing (including  washing, rinsing, drying)?: A Lot Help from another person to put on and taking off regular upper body clothing?: A Lot Help from another person to put on and taking off regular lower body clothing?: A Lot 6 Click Score: 13   End of Session Nurse Communication: Mobility status  Activity Tolerance: Patient tolerated treatment well Patient left: in bed;with call bell/phone within reach;with bed alarm set;with restraints reapplied;with  family/visitor present  OT Visit Diagnosis: Unsteadiness on feet (R26.81);Repeated falls (R29.6);Muscle weakness (generalized) (M62.81)                Time: 1013-1030 OT Time Calculation (min): 17 min Charges:  OT General Charges $OT Visit: 1 Visit OT Evaluation $OT Re-eval: 1 Re-eval  [facility] MS, OTR/L ascom [phone]  01/27/22, 12:42 PM

## 2022-01-27 NOTE — Evaluation (Signed)
Physical Therapy Re-Evaluation Patient Details Name: Troy Berry. MRN: [id number] DOB: [birth year] Today's Date: 01/27/2022  History of Present Illness  Patient is a 76 y.o.  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 He was admitted on 01/03/2022 for Acute on chronic renal failure, small bowel obstruction  s/p reduction of internal hernia. patient with midline wound dehiscence status post wound closure on 10/6.  Clinical Impression  PT re-evaluation performed d/t extended hospitalization.  Prior to hospital admission, pt was ambulatory with RW; lives with his wife.  During session pt able to state name only (pt repeated his name multiple times when asked DOB); inconsistent with following 1 step cues.  Currently pt is mod to max assist semi-supine to/from sitting edge of bed; min to mod assist x2 for transfers using RW (x7 trials with bed height mildly elevated); and min to mod assist x2 to side step to R a couple feet along bed with RW use.  Cueing required for upright positioning in sitting and standing (pt in flexed trunk and flexed cervical position).  Pt would benefit from skilled PT to address noted impairments and functional limitations (see below for any additional details).  Upon hospital discharge, pt would benefit from SNF.  PT POC reviewed and updated as appropriate.       Recommendations for follow up therapy are one component of a multi-disciplinary discharge planning process, led by the attending physician.  Recommendations may be updated based on patient status, additional functional criteria and insurance authorization.  Follow Up Recommendations Skilled nursing-short term rehab (<3 hours/day) Can patient physically be transported by private vehicle: No    Assistance Recommended at Discharge Frequent or constant Supervision/Assistance  Patient can return home with the following   Two people to help with walking and/or transfers;A lot of help with bathing/dressing/bathroom;Assist for transportation;Help with stairs or ramp for entrance;Direct supervision/assist for medications management;Assistance with feeding;Assistance with cooking/housework;Direct supervision/assist for financial management    Equipment Recommendations Other (comment) (TBD at next facility)  Recommendations for Other Services       Functional Status Assessment Patient has had a recent decline in their functional status and demonstrates the ability to make significant improvements in function in a reasonable and predictable amount of time.     Precautions / Restrictions Precautions Precautions: Fall Precaution Comments: suprapubic catheter; abdominal incision Restrictions Weight Bearing Restrictions: No      Mobility  Bed Mobility Overal bed mobility: Needs Assistance Bed Mobility: Supine to Sit, Sit to Supine     Supine to sit: Mod assist, Max assist, HOB elevated Sit to supine: Max assist, HOB elevated   General bed mobility comments: assist for trunk and B LE's; vc's for technique; assist to scoot towards edge of bed using bed sheet    Transfers Overall transfer level: Needs assistance Equipment used: Rolling walker (2 wheels) Transfers: Sit to/from Stand Sit to Stand: Min assist, Mod assist, +2 physical assistance, From elevated surface           General transfer comment: min to mod assist x2 to stand from mildly elevated bed height x7 trials; assist to block B feet to prevent feet from sliding forward    Ambulation/Gait Ambulation/Gait assistance: Min assist, Mod assist, +2 physical assistance Gait Distance (Feet):  (pt side stepped to R a couple feet (x2 trials) along bed) Assistive device: Rolling walker (2 wheels)   Gait velocity: decreased     General Gait  Details: increased effort and time to take small steps; vc's for technique  Stairs             Wheelchair Mobility    Modified Rankin (Stroke Patients Only)       Balance Overall balance assessment: Needs assistance Sitting-balance support: Bilateral upper extremity supported, Feet supported Sitting balance-[name]: Fair Sitting balance - Comments: steady static sitting   Standing balance support: Bilateral upper extremity supported Standing balance-[name]: Poor Standing balance comment: CGA to min assist x2 for safety/balance in standing with B UE support on RW                             Pertinent Vitals/Pain Pain Assessment Pain Assessment: Faces Faces Pain Scale: No hurt Pain Intervention(s): Limited activity within patient's tolerance, Monitored during session, Repositioned Vitals (HR and O2 on room air) stable and WFL throughout treatment session.    Home Living Family/patient expects to be discharged to:: Private residence Living Arrangements: Spouse/significant other Available Help at Discharge: Family;Available 24 hours/day Type of Home: House Home Access: Level entry       Home Layout: One level Home Equipment: Conservation officer, nature (2 wheels);BSC/3in1      Prior Function Prior Level of Function : Needs assist             Mobility Comments: Ambulatory with RW modified independently baseline ADLs Comments: Per previous notes "Per spouse in room, pt getting sponge bath in bed and needs assistance with self care tasks. Wife performs all IADLs. She has to cue him for sequencing and initiation at baseline."     Hand Dominance        Extremity/Trunk Assessment   Upper Extremity Assessment Upper Extremity Assessment: Difficult to assess due to impaired cognition;Defer to OT evaluation    Lower Extremity Assessment Lower Extremity Assessment: Generalized weakness;Difficult to assess due to impaired cognition    Cervical / Trunk Assessment Cervical / Trunk Assessment: Other exceptions Cervical / Trunk Exceptions: forward  head/shoulders  Communication   Communication: HOH  Cognition Arousal/Alertness: Awake/alert Behavior During Therapy: Flat affect Overall Cognitive Status: Impaired/Different from baseline Area of Impairment: Following commands, Problem solving, Attention, Safety/judgement, Orientation                 Orientation Level: Disoriented to, Place, Time, Situation, Person (Oriented to name only) Current Attention Level: Selective   Following Commands: Follows one step commands inconsistently Safety/Judgement: Decreased awareness of safety, Decreased awareness of deficits   Problem Solving: Slow processing, Decreased initiation, Difficulty sequencing, Requires verbal cues, Requires tactile cues General Comments: B mitts in place        General Comments General comments (skin integrity, edema, etc.): no drainage noted from abdominal incision; dark red blood noted in foley catheter bag prior to therapy activity (MD present during session and notified).  Nursing cleared pt for participation in physical therapy.  Pt and pt's wife agreeable to PT session.    Exercises     Assessment/Plan    PT Assessment Patient needs continued PT services  PT Problem List Decreased strength;Decreased range of motion;Decreased activity tolerance;Decreased balance;Decreased mobility;Decreased cognition;Decreased knowledge of use of DME;Decreased knowledge of precautions;Decreased safety awareness       PT Treatment Interventions DME instruction;Gait training;Stair training;Functional mobility training;Therapeutic activities;Therapeutic exercise;Balance training;Neuromuscular re-education;Cognitive remediation;Patient/family education    PT Goals (Current goals can be found in the Care Plan section)  Acute Rehab PT Goals Patient Stated Goal: to improve  strength and mobility PT Goal Formulation: With patient/family Time For Goal Achievement: 01/24/22 Potential to Achieve Goals: Fair    Frequency Min  2X/week     Co-evaluation               AM-PAC PT "6 Clicks" Mobility  Outcome Measure Help needed turning from your back to your side while in a flat bed without using bedrails?: A Lot Help needed moving from lying on your back to sitting on the side of a flat bed without using bedrails?: A Lot Help needed moving to and from a bed to a chair (including a wheelchair)?: Total Help needed standing up from a chair using your arms (e.g., wheelchair or bedside chair)?: Total Help needed to walk in hospital room?: Total Help needed climbing 3-5 steps with a railing? : Total 6 Click Score: 8    End of Session Equipment Utilized During Treatment: Gait belt (up high away from incision) Activity Tolerance: Patient tolerated treatment well Patient left: in bed;with call bell/phone within reach;with bed alarm set;with family/visitor present;Other (comment) (B mitts in place; B heels floating via pillow support) Nurse Communication: Mobility status;Precautions PT Visit Diagnosis: Unsteadiness on feet (R26.81);Muscle weakness (generalized) (M62.81);Other abnormalities of gait and mobility (R26.89)    Time: 1173-5670 PT Time Calculation (min) (ACUTE ONLY): 38 min   Charges:   PT Evaluation $PT Re-evaluation: 1 Re-eval PT Treatments $Therapeutic Activity: 23-37 mins       Leitha Bleak, PT 01/27/22, 1:17 PM

## 2022-01-27 NOTE — TOC Progression Note (Signed)
Transition of Care (TOC) - Progression Note    Patient Details  Name: Troy Berry. MRN: [id number] Date of Birth: [birth year]  Transition of Care [facility]) CM/SW Contact  Beverly Sessions, RN Phone Number: 01/27/2022, 3:59 PM  Clinical Narrative:     Per Pharmacy TPN half rate today and dc tomorrow Per Bedside RN mitts in places Discussed with Ebony Hail at [facility] and if TPN is stopped and mitts are discontinued they can offer a bed   Expected Discharge Plan:  (TBD) Barriers to Discharge: Continued Medical Work up  Expected Discharge Plan and Services Expected Discharge Plan:  (TBD)     Post Acute Care Choice:  (TBD) Living arrangements for the past 2 months: Single Family Home                                       Social Determinants of Health (SDOH) Interventions Transportation Interventions: Inpatient TOC, Other (Comment) (Transportation provided through [employer])  Readmission Risk Interventions     No data to display

## 2022-01-27 NOTE — Progress Notes (Signed)
Palliative Care Progress Note, Assessment & Plan   [name].       Date: 01/27/2022 DOB: Jul 22, 1945  Age: 76 y.o. MRN#: [id number] Attending Physician: Elmarie Shiley, MD Primary Care Physician: [facility] [place] Date: 01/03/2022  Reason for Consultation/Follow-up: Establishing goals of care  Subjective: Patient is lying in bed.  He acknowledges my presence and is able to make his wishes known.  He opens his eyes briefly but quickly returns to sleep during my visit.  His wife is at bedside.  Summary of counseling/coordination of care: After reviewing the patient's chart and assessing the patient at bedside, I discussed patient's plan of care and goals for treatment with patient's wife at bedside.  She shares that she and her daughters are in agreement to do everything possible to keep the patient alive.  She shares she and her daughters want to leave it in God's hands and give the patient his best shot at living.  Discussed in detail that CODE STATUS is currently full code.  Full code versus DNR again reviewed.  Wife confirmed full code and full scope.   During CODE STATUS discussion, patient's wife and I reviewed that in the event that the patient was placed on mechanical ventilation that it would be short-term.  Patient's wife was clear that she would never want the patient to live long-term on life support.  Patient's wife relies heavily on her faith and trust that what ever happens is in [employer]. Therapeutic silence and active listening provided for patient to share her thoughts and emotions regarding current medical situation.  Emotional support provided.  Goals are clear.  Full code remains. Family is accepting of all offered, available, and  appropriate measures to sustain patient's life.  PMT will continue to follow the patient and shadow/monitor him peropherally. PMT will re-engage at patient/family's request, if goals change, or if patient's health deteriorates during hospitalization.   Family has PMT contact info.   Physical Exam Vitals reviewed.  Constitutional:      Appearance: Normal appearance.  HENT:     Head: Normocephalic and atraumatic.     Mouth/Throat:     Mouth: Mucous membranes are moist.  Eyes:     Pupils: Pupils are equal, round, and reactive to light.  Cardiovascular:     Rate and Rhythm: Normal rate.     Pulses: Normal pulses.  Pulmonary:     Effort: Pulmonary effort is normal.  Abdominal:     Palpations: Abdomen is soft.     Tenderness: There is no abdominal tenderness. There is no guarding.  Musculoskeletal:     Comments: Generalized weakness  Skin:    General: Skin is warm and dry.  Neurological:     Comments: UTA as pt did not participate in discussions today             Palliative Assessment/Data: 50%    Total Time 50 minutes  Greater than 50%  of this time was spent counseling and coordinating care related to the above assessment and plan.  Thank you for allowing the Palliative Medicine Team to assist in the care of this patient.  [place] Rosana Berger, DNP,  FNP-BC Palliative Medicine Team Team Phone # [phone]

## 2022-01-27 NOTE — Consult Note (Addendum)
PHARMACY - TOTAL PARENTERAL NUTRITION CONSULT NOTE   Indication: Prolonged ileus  Patient Measurements: Height: '5\' 9"'$  (175.3 cm) Weight: 60.8 kg (134 lb 0.6 oz) IBW/kg (Calculated) : 70.7 TPN AdjBW (KG): 73 Body mass index is 19.79 kg/m.   Assessment: 76 y.o. male with medical history significant of atonic bladder s/p suprapubic catheter, CKD Stage 4, anemia of chronic kidney disease, hypertension, gout found with small bowel obstruction 7 Day Post-Op s/p reduction of internal hernia.  This was complicated by midline wound dehiscence status post wound closure postop    Glucose / Insulin: (Gluc 100-110s) SSI stopped, not required Electrolytes: electrolytes WNL  Renal: Scr 4.85-->4.09 (BL 2.5-3, per nephrology no HD needed), BUN 70>>91>114 Hepatic: LFTs WNL Intake / Output; MIVF: -8.2L; UOP 1.3>0.7>0.32m/k/h GI Imaging:  -[facility] [place] - No abnormal bowel dilatation noted. -CT 10/11: Persistent small bowel dilatation with fluid-filled loops.  -CT 10/9:  findings favor ileus versus a partial small bowel obstruction, GI Surgeries / Procedures:  -Ex Lap 10/6: closure of opened abdominal incision wound -Ex Lap 10/3: reduction of internal hernia   Central access: DL PICC placed 10/11 TPN start date: 10/11  Nutritional Goals: Goal TPN rate is 75 mL/hr (provides 104.4 g of protein and 2197 kcals per day)  RD Assessment: Estimated Needs Total Energy Estimated Needs: 1800-2200kcal/day Total Protein Estimated Needs: 90-105g/day Total Fluid Estimated Needs: 1.7-2.0L/day  Current Nutrition:  NPO  10/14: did not tolerate NGT clamp trial  Plan:  -- Pt diet advanced and tolerated x2days and bowel mvmt's returned showing resolution of prolonged post-op ileus & last imagine consistent with recovery. Plan to wean TPN by 50% today (10/23), plan for off tomorrow (10/24) per d/w surgery team. TPN at 75>40 ml/hr on 01/27/2022 '@1800'$  --nutritional components Amino acids (using 15% Clinisol)  104.4 grams Dextrose: 18.7% / 329.4 grams Lipids: 66 grams kCal: 2197 --Electrolytes in TPN: Na 539m/L,  K 30 mEq/L, Ca 34m38mL,  3 mEq/L and Phos 5 mmol/L. Cl:Ac 1:1 - Decreased Lasix '20mg'$  IV BID > '20mg'$  IV QD --Add standard MVI and trace elements to TPN, remove chromium d/t renal insufficiency --Monitor TPN labs on Mon/Thurs, and as needed until stable  BraShanon Browers 01/27/2022,10:58 AM

## 2022-01-27 NOTE — Progress Notes (Signed)
PROGRESS NOTE    Troy Berry.  ZOX:[id number] DOB: 01-Nov-1945 DOA: 01/03/2022 PCP: [facility] [place]   Brief Narrative: 76 year old with past medical history significant for atonic bladder status post suprapubic catheter, stage IV CKD, hypertension presents to the ED on 9/30 with complaint of suprapubic catheter dysfunction as well as abdominal distention.  Patient was found to have possible SBO versus ileus.  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  General surgery and urology were consulted.  Patient was a started on cefepime to cover for UTI.  NG tube placed for abdominal distention.  Patient did not improve with conservative management and underwent exploratory laparoscopy and reduction of internal hernia 10/3.  Patient was noted to have acute onset of M-End and serous drainage through day 1, wound dehiscence and omental exposure.  On 10/6 patient was taken to the OR for wound closure.  He was noted to have distended bladder despite suprapubic catheter.  Bloody urine was aspirated.  There is no evidence of bowel obstruction.  On 7/11 decision was made to start patient on TPN.  NG tube has been removed, has had BM. Diet advanced to soft.      Assessment & Plan:   Principal Problem:   Acute renal failure superimposed on stage 4 chronic kidney disease (HCC) Active Problems:   SBO (small bowel obstruction) (HCC)   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HCC)   Anemia   Hematuria   Urinary tract infection   Metabolic acidosis   Essential hypertension   Gross hematuria   Dementia without behavioral disturbance (HCC)   Hypokalemia   Acute blood loss anemia   Protein-calorie malnutrition, severe   Wound dehiscence   1-SBO -Status post reduction of ventral hernia complicated by midline wound dehiscence he is  status post wound closure. -CT shows some evidence of bowel dilation with no focal point demonstrated..  CT cystogram demonstrated persistent small bowel dilation with evidence of focal transition in the central mesentery.  Patient did not improve with conservative management.  Underwent lap and reduction of hernia 10/3. -TPN due to persistent ileus. When food intake is appropriate.  -NG tube has been removed. Diet advanced to Soft.  Discontinue morphine due to CKD. Low dose dilaudid PRN order    2-AKI on CKD stage IV Creatinine peaked to 4.8 compared to prior 3.5 Component of obstructive uropathy and suprapubic catheter dysfunction.  Urology replace suprapubic catheter. Also component of ATN.  Nephrology following, patient not felt to be a good long term candidate for dialysis.  Continue to have good urine output.  Creatinine trending down Monitor on IV lasix. Change to daily.  Cr increase to 4 today   3-Suprapubic catheter dysfunction: History of atonic bladder requiring suprapubic catheter placed approximately 1 month ago 80/31/2023. Presented with hematuria and poor urinary drainage. Evaluated by urology 9/27, it was discovered that suprapubic catheter tip was not in the bladder but adjacent to the small bowel.  Urology consulted and suprapubic catheter replaced with cystogram demonstrating appropriate placement within the bladder. Hematuria persist. Urology will follow on on patient tomorrow.   Anemia, acute blood loss anemia postsurgery, hematuria and combination of anemia of chronic disease Patient has had multiple blood transfusion total of 3 Received IV iron 10/18 Monitor hemoglobin  Hematuria secondary to suprapubic catheter dysfunction  Urinary tract infection secondary to obstructive uropathy.  Completed course  of IV antibiotics Hyponatremia: Resolved  Metabolic acidosis in the setting of CKD. resolved  Hypertension: Continue amlodipine carvedilol  Dementia : At risk for  delirium.   Delirium precaution.   Wound dehiscence Following exploratory laparotomy, wound dehisced requiring patient to go back to the OR on 10/6.  Surgery following   Protein-calorie malnutrition, severe Nutrition Status: Nutrition Problem: Severe Malnutrition Etiology: chronic illness (dementia, CKD, internal hernia) Signs/Symptoms: severe fat depletion, severe muscle depletion on TPN See nurse documentation.   Nutrition Problem: Severe Malnutrition Etiology: chronic illness (dementia, CKD, internal hernia)    Signs/Symptoms: severe fat depletion, severe muscle depletion       Estimated body mass index is 19.79 kg/m as calculated from the following:   Height as of this encounter: '5\' 9"'$  (1.753 m).   Weight as of this encounter: 60.8 kg.   DVT prophylaxis: SCD Code Status: Full code Family Communication: Wife at bedside 10/19.  Disposition Plan:  Status is: Inpatient Remains inpatient appropriate because: management of SBP post op complications.     Consultants:  Surgery Urology Nephrology  Palliative   Procedures:  Status post reduction of ventral hernia complicated by midline wound dehiscence he is status post wound closure.  Antimicrobials:    Subjective: He is alert, denies pain   Objective: Vitals:   01/26/22 2038 01/27/22 0453 01/27/22 0500 01/27/22 0908  BP: (!) 94/58 (!) 140/59  (!) 155/61  Pulse: 80 85  84  Resp: '16 18  18  '$ Temp: 98.2 F (36.8 C) 98.2 F (36.8 C)  98.4 F (36.9 C)  TempSrc: Oral Oral    SpO2: 100% 98%  100%  Weight:   60.8 kg   Height:        Intake/Output Summary (Last 24 hours) at 01/27/2022 1505 Last data filed at 01/27/2022 1410 Gross per 24 hour  Intake 2854.73 ml  Output 2250 ml  Net 604.73 ml    Filed Weights   01/21/22 1134 01/26/22 0500 01/27/22 0500  Weight: 64.7 kg 60.8 kg 60.8 kg    Examination:  General exam: NAD Respiratory system: CTA Cardiovascular system: S 1, S 2 RRR Gastrointestinal  system: BBS present, soft, nt, incision healed.  Central nervous system: alert, follows command Extremities: No edema  Data Reviewed: I have personally reviewed following labs and imaging studies  CBC: Recent Labs  Lab 01/22/22 0328 01/23/22 0402 01/24/22 0710 01/25/22 1236 01/26/22 0521 01/27/22 0706  WBC 8.1 8.3  --  9.4 12.0* 9.7  HGB 7.5* 7.2* 7.5* 7.1* 7.7* 7.1*  HCT 23.4* 22.8* 23.8* 21.8* 23.6* 22.5*  MCV 86.0 86.0  --  85.5 84.9 86.2  PLT 196 183  --  218 232 332    Basic Metabolic Panel: Recent Labs  Lab 01/23/22 0402 01/24/22 0710 01/25/22 0555 01/26/22 0521 01/27/22 0701  NA 135 136 136 139 139  K 4.3 4.5 4.3 4.4 4.3  CL 104 106 108 112* 110  CO2 23 23 21* 20* 20*  GLUCOSE 102* 107* 116* 118* 101*  BUN 113* 101* 105* 92* 114*  CREATININE 3.97* 3.76* 3.70* 3.75* 4.09*  CALCIUM 9.3 9.9 10.0 10.3 10.5*  MG 2.1  --   --   --  2.0  PHOS 2.7  --   --   --  2.8    GFR: Estimated Creatinine Clearance: 13 mL/min (A) (by C-G formula based on SCr of 4.09 mg/dL (H)). Liver Function Tests: Recent Labs  Lab 01/23/22 0402 01/27/22 0701  AST 20 28  ALT  12 12  ALKPHOS 76 66  BILITOT 0.6 0.2*  PROT 5.3* 5.8*  ALBUMIN 2.3* 2.4*    No results for input(s): "LIPASE", "AMYLASE" in the last 168 hours. No results for input(s): "AMMONIA" in the last 168 hours. Coagulation Profile: No results for input(s): "INR", "PROTIME" in the last 168 hours. Cardiac Enzymes: No results for input(s): "CKTOTAL", "CKMB", "CKMBINDEX", "TROPONINI" in the last 168 hours. BNP (last 3 results) No results for input(s): "PROBNP" in the last 8760 hours. HbA1C: No results for input(s): "HGBA1C" in the last 72 hours. CBG: Recent Labs  Lab 01/21/22 0503 01/21/22 1144 01/21/22 1700 01/21/22 2305 01/22/22 0614  GLUCAP 116* 114* 130* 97 93    Lipid Profile: Recent Labs    01/27/22 0526 01/27/22 0701  TRIG 859* 50    Thyroid Function Tests: No results for input(s): "TSH",  "T4TOTAL", "FREET4", "T3FREE", "THYROIDAB" in the last 72 hours. Anemia Panel: No results for input(s): "VITAMINB12", "FOLATE", "FERRITIN", "TIBC", "IRON", "RETICCTPCT" in the last 72 hours. Sepsis Labs: No results for input(s): "PROCALCITON", "LATICACIDVEN" in the last 168 hours.  No results found for this or any previous visit (from the past 240 hour(s)).       Radiology Studies: No results found.      Scheduled Meds:  Chlorhexidine Gluconate Cloth  6 each Topical Daily   [START ON 01/28/2022] furosemide  20 mg Intravenous Daily   polyethylene glycol  17 g Oral BID   sodium chloride flush  10-40 mL Intracatheter Q12H   sodium chloride flush  3 mL Intravenous Q12H   Continuous Infusions:  sodium chloride Stopped (01/22/22 2041)   TPN ADULT (ION) 40 mL/hr at 01/27/22 1145     LOS: 23 days    Time spent: 35 minutes.     Elmarie Shiley, MD [facility]   If 7PM-7AM, please contact night-coverage [email or web address]  01/27/2022, 3:05 PM

## 2022-01-27 NOTE — Progress Notes (Signed)
Patient ID: Troy Berry., male   DOB: 01-07-1946, 76 y.o.   MRN: [id number]     [facility] Day(s): 23.   Interval History: Patient seen and examined, no acute events or new complaints overnight.  Patient has been able to be tolerating soft solid diet.  He had a bowel movement yesterday.  No sign of nausea or vomiting.  Vital signs in last 24 hours: [min-max] current  Temp:  [98.1 F (36.7 C)-98.3 F (36.8 C)] 98.2 F (36.8 C) (10/23 0453) Pulse Rate:  [71-85] 85 (10/23 0453) Resp:  [15-18] 18 (10/23 0453) BP: (94-140)/(43-59) 140/59 (10/23 0453) SpO2:  [98 %-100 %] 98 % (10/23 0453) Weight:  [60.8 kg] 60.8 kg (10/23 0500)     Height: '5\' 9"'$  (175.3 cm) Weight: 60.8 kg BMI (Calculated): 19.79   Physical Exam:  Constitutional: alert, cooperative and no distress  Respiratory: breathing non-labored at rest  Cardiovascular: regular rate and sinus rhythm  Gastrointestinal: soft, non-tender, and non-distended  Labs:     Latest Ref Rng & Units 01/26/2022    5:21 AM 01/25/2022   12:36 PM 01/24/2022    7:10 AM  CBC  WBC 4.0 - 10.5 K/uL 12.0  9.4    Hemoglobin 13.0 - 17.0 g/dL 7.7  7.1  7.5   Hematocrit 39.0 - 52.0 % 23.6  21.8  23.8   Platelets 150 - 400 K/uL 232  218        Latest Ref Rng & Units 01/27/2022    7:01 AM 01/26/2022    5:21 AM 01/25/2022    5:55 AM  CMP  Glucose 70 - 99 mg/dL 101  118  116   BUN 8 - 23 mg/dL 114  92  105   Creatinine 0.61 - 1.24 mg/dL 4.09  3.75  3.70   Sodium 135 - 145 mmol/L 139  139  136   Potassium 3.5 - 5.1 mmol/L 4.3  4.4  4.3   Chloride 98 - 111 mmol/L 110  112  108   CO2 22 - 32 mmol/L '20  20  21   '$ Calcium 8.9 - 10.3 mg/dL 10.5  10.3  10.0   Total Protein 6.5 - 8.1 g/dL 5.8     Total Bilirubin 0.3 - 1.2 mg/dL 0.2     Alkaline Phos 38 - 126 U/L 66     AST 15 - 41 U/L 28     ALT 0 - 44 U/L 12       Imaging studies: No new pertinent imaging studies   Assessment/Plan:  76 y.o. male with small bowel  obstruction 20 Day Post-Op s/p reduction of internal hernia.  The was complicated by midline wound dehiscence status post wound closure postop day #17.     Prolonged postoperative ileus -This seems to be finally resolved.  Patient has been tolerating soft diet for the last 2 days.  He continue having bowel movement. -I remove the skin staples and the wound is completely dry and clean.  No sign of infection. -From a surgical standpoint patient can be discharged if medically stable -At this point patient being more than 2 weeks out of surgery and with staple removed from the wound dry and clean patient might not need surgical follow-up especially since he will be difficult transportation.  If there is any concern I can follow in my office as needed. -Patient may be advanced to regular diet at discharge -Continue medical management as per primary team  Arnold Long, MD

## 2022-01-28 ENCOUNTER — Ambulatory Visit: Payer: Medicare HMO | Admitting: Physician Assistant

## 2022-01-28 DIAGNOSIS — R310 Gross hematuria: Secondary | ICD-10-CM | POA: Diagnosis not present

## 2022-01-28 DIAGNOSIS — N184 Chronic kidney disease, stage 4 (severe): Secondary | ICD-10-CM | POA: Diagnosis not present

## 2022-01-28 DIAGNOSIS — N179 Acute kidney failure, unspecified: Secondary | ICD-10-CM | POA: Diagnosis not present

## 2022-01-28 LAB — BASIC METABOLIC PANEL
Anion gap: 9 (ref 5–15)
BUN: 119 mg/dL — ABNORMAL HIGH (ref 8–23)
CO2: 17 mmol/L — ABNORMAL LOW (ref 22–32)
Calcium: 9.8 mg/dL (ref 8.9–10.3)
Chloride: 112 mmol/L — ABNORMAL HIGH (ref 98–111)
Creatinine, Ser: 4 mg/dL — ABNORMAL HIGH (ref 0.61–1.24)
GFR, Estimated: 15 mL/min — ABNORMAL LOW (ref >60)
Glucose, Bld: 78 mg/dL (ref 70–99)
Potassium: 4.3 mmol/L (ref 3.5–5.1)
Sodium: 138 mmol/L (ref 135–145)

## 2022-01-28 LAB — CBC
HCT: 21.5 % — ABNORMAL LOW (ref 39.0–52.0)
Hemoglobin: 7 g/dL — ABNORMAL LOW (ref 13.0–17.0)
MCH: 28 pg (ref 26.0–34.0)
MCHC: 32.6 g/dL (ref 30.0–36.0)
MCV: 86 fL (ref 80.0–100.0)
Platelets: 192 10*3/uL (ref 150–400)
RBC: 2.5 MIL/uL — ABNORMAL LOW (ref 4.22–5.81)
RDW: 16.9 % — ABNORMAL HIGH (ref 11.5–15.5)
WBC: 9.7 10*3/uL (ref 4.0–10.5)
nRBC: 0 % (ref 0.0–0.2)

## 2022-01-28 MED ORDER — SODIUM BICARBONATE 650 MG PO TABS
650.0000 mg | ORAL_TABLET | Freq: Two times a day (BID) | ORAL | Status: DC
  Administered 2022-01-28 – 2022-01-31 (×7): 650 mg via ORAL
  Filled 2022-01-28 (×7): qty 1

## 2022-01-28 MED ORDER — ADULT MULTIVITAMIN W/MINERALS CH
1.0000 | ORAL_TABLET | Freq: Every day | ORAL | Status: DC
  Administered 2022-01-29 – 2022-01-31 (×3): 1 via ORAL
  Filled 2022-01-28 (×3): qty 1

## 2022-01-28 MED ORDER — ENSURE ENLIVE PO LIQD
237.0000 mL | Freq: Three times a day (TID) | ORAL | Status: DC
  Administered 2022-01-28 – 2022-01-31 (×7): 237 mL via ORAL

## 2022-01-28 NOTE — Progress Notes (Signed)
Patient ID: Troy Beals., male   DOB: Apr 28, 1945, 76 y.o.   MRN: [id number]     [facility] Day(s): 24.   Interval History: Patient seen and examined.  Patient today was very alert laying in bed.  Patient wants to get out of bed.  There have been no nausea or vomiting.  Patient tolerated diet.  Apparently patient pulled PICC line yesterday.  Vital signs in last 24 hours: [min-max] current  Temp:  [97.7 F (36.5 C)-99.1 F (37.3 C)] 97.7 F (36.5 C) (10/24 0454) Pulse Rate:  [71-84] 81 (10/24 0454) Resp:  [18] 18 (10/24 0454) BP: (111-155)/(56-81) 135/81 (10/24 0454) SpO2:  [97 %-100 %] 97 % (10/24 0454) Weight:  [63.7 kg] 63.7 kg (10/24 0500)     Height: '5\' 9"'$  (175.3 cm) Weight: 63.7 kg BMI (Calculated): 20.73   Physical Exam:  Constitutional: alert, cooperative and no distress  Respiratory: breathing non-labored at rest  Cardiovascular: regular rate and sinus rhythm  Gastrointestinal: soft, non-tender, and non-distended  Labs:     Latest Ref Rng & Units 01/28/2022    5:33 AM 01/27/2022    7:06 AM 01/26/2022    5:21 AM  CBC  WBC 4.0 - 10.5 K/uL 9.7  9.7  12.0   Hemoglobin 13.0 - 17.0 g/dL 7.0  7.1  7.7   Hematocrit 39.0 - 52.0 % 21.5  22.5  23.6   Platelets 150 - 400 K/uL 192  223  232       Latest Ref Rng & Units 01/28/2022    5:33 AM 01/27/2022    7:01 AM 01/26/2022    5:21 AM  CMP  Glucose 70 - 99 mg/dL 78  101  118   BUN 8 - 23 mg/dL 119  114  92   Creatinine 0.61 - 1.24 mg/dL 4.00  4.09  3.75   Sodium 135 - 145 mmol/L 138  139  139   Potassium 3.5 - 5.1 mmol/L 4.3  4.3  4.4   Chloride 98 - 111 mmol/L 112  110  112   CO2 22 - 32 mmol/L '17  20  20   '$ Calcium 8.9 - 10.3 mg/dL 9.8  10.5  10.3   Total Protein 6.5 - 8.1 g/dL  5.8    Total Bilirubin 0.3 - 1.2 mg/dL  0.2    Alkaline Phos 38 - 126 U/L  66    AST 15 - 41 U/L  28    ALT 0 - 44 U/L  12      Imaging studies: No new pertinent imaging studies   Assessment/Plan:  76 y.o.  male with small bowel obstruction 21 Day Post-Op s/p reduction of internal hernia.  The was complicated by midline wound dehiscence status post wound closure postop day #18.  -Finally recovering adequately -Continue with soft abdomen, no abdominal distention -Tolerating diet -Patient has not received MiraLAX in the last 2 days.  He should continue bowel regimen to avoid constipation -Discontinue TPN -I will advance his diet to regular renal restriction diet -No further surgical intervention needed -May be discharged from surgical standpoint if medically stable -At this point no outpatient follow-up is needed since the wound is completely healed staples are removed and patient is having adequate bowel movements and tolerating diet -I will remain aware while he is in the hospital to assist in any patient's need  Arnold Long, MD

## 2022-01-28 NOTE — Progress Notes (Signed)
Patient is very confused and only alert to self. Patient's  bed alarm frequently alerts to patient getting out of bed. Upon obtaining v/s, noticed that patient had pulled out his PICC line even with mittens on. Patient requires closer evaluation due to recent events. End of PICC line appeared to be broken off when evaluated-left on counter for assessment by provider.

## 2022-01-28 NOTE — Progress Notes (Signed)
Nutrition Follow Up Note   DOCUMENTATION CODES:   Severe malnutrition in context of chronic illness  INTERVENTION:   Ensure Enlive po TID, each supplement provides 350 kcal and 20 grams of protein.  Magic cup TID with meals, each supplement provides 290 kcal and 9 grams of protein  MVI po daily   Liberalize diet   NUTRITION DIAGNOSIS:   Severe Malnutrition related to chronic illness (dementia, CKD, internal hernia) as evidenced by severe fat depletion, severe muscle depletion. -ongoing   GOAL:   Patient will meet greater than or equal to 90% of their needs -previously met with TPN  MONITOR:   PO intake, Supplement acceptance, Diet advancement, Labs, Weight trends, Skin, I & O's  ASSESSMENT:   76 y/o male with h/o atonic bladder with chronic foley, CKD IV, dementia, HTN and gout who is admitted with SBO now s/p ex lap with reduction of internal hernia 72/5 complicated by wound dehiscence requiring reopening of recent laparotomy and closure of abdominal wound 10/6.  Pt advanced to a regular diet today. TPN discontinued. Pt eating ~50% of meals in hospital. RD will add supplements and MVI to help pt meet his estimated needs. RD will also liberalize pt's diet. Per chart, pt is down ~20lbs(12%) since admission but appears weight stable over the past two weeks since starting TPN. Pt is having bowel function. Pt remains confused. Plan is for SNF at bedside.   Medications reviewed and include: lasix, miralax, Na bicarbonate  Labs reviewed: K 4.3 wnl, BUN 119(H), creat 4.00(H) P 2.8 wnl, Mg 2.0 wnl- 10/23 Triglycerides 50- 10/23 Hgb 7.0(L), Hct 21.5(L)  Diet Order:   Diet Order             Diet regular Room service appropriate? No; Fluid consistency: Thin  Diet effective now                  EDUCATION NEEDS:   Education needs have been addressed  Skin:  Skin Assessment: Reviewed RN Assessment (incision abdomen)  Last BM:  10/22- type 6  Height:   Ht Readings  from Last 1 Encounters:  01/03/22 $RemoveB'5\' 9"'iylBUmNa$  (1.753 m)    Weight:   Wt Readings from Last 1 Encounters:  01/28/22 63.7 kg    Ideal Body Weight:  72.7 kg  BMI:  Body mass index is 20.74 kg/m.  Estimated Nutritional Needs:   Kcal:  1800-2200kcal/day  Protein:  90-105g/day  Fluid:  1.7-2.0L/day  Koleen Distance MS, RD, LDN Please refer to [facility] for RD and/or RD on-call/weekend/after hours pager

## 2022-01-28 NOTE — Progress Notes (Signed)
1:1 sitter ordered by provider. Gave patient prn haldol due to agitation. IV team nurse confirmed that previous PICC line was intact by measuring the catheter. Discarded catheter after it was confirmed intact. Provider Valetta Fuller did come to assess but I informed her that IV team had already confirmed that catheter was intact.

## 2022-01-28 NOTE — Progress Notes (Signed)
PROGRESS NOTE    Troy Berry.  AYT:[id number] DOB: 1945/06/26 DOA: 01/03/2022 PCP: [facility]   Brief Narrative: 76 year old with past medical history significant for atonic bladder status post suprapubic catheter, stage IV CKD, hypertension presents to the ED on 9/30 with complaint of suprapubic catheter dysfunction as well as abdominal distention.  Patient was found to have possible SBO versus ileus.  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  General surgery and urology were consulted.  Patient was a started on cefepime to cover for UTI.  NG tube placed for abdominal distention.  Patient did not improve with conservative management and underwent exploratory laparoscopy and reduction of internal hernia 10/3.  Patient was noted to have acute onset of M-End and serous drainage through day 1, wound dehiscence and omental exposure.  On 10/6 patient was taken to the OR for wound closure.  He was noted to have distended bladder despite suprapubic catheter.  Bloody urine was aspirated.  There is no evidence of bowel obstruction.  On 7/11 decision was made to start patient on TPN.  NG tube has been removed, has had BM. Diet advanced to soft. Awaiting stability of Hb and renal function.      Assessment & Plan:   Principal Problem:   Acute renal failure superimposed on stage 4 chronic kidney disease (HCC) Active Problems:   SBO (small bowel obstruction) (HCC)   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HCC)   Anemia   Hematuria   Urinary tract infection   Metabolic acidosis   Essential hypertension   Gross hematuria   Dementia without behavioral disturbance (HCC)   Hypokalemia   Acute blood loss anemia   Protein-calorie malnutrition, severe   Wound dehiscence   1-SBO -Status post reduction of ventral hernia  complicated by midline wound dehiscence he is status post wound closure. -CT shows some evidence of bowel dilation with no focal point demonstrated..  CT cystogram demonstrated persistent small bowel dilation with evidence of focal transition in the central mesentery.  Patient did not improve with conservative management.  Underwent lap and reduction of hernia 10/3. -TPN has been discontinue.  -NG tube has been removed. Diet advanced to Soft.  Discontinue morphine due to CKD. Low dose dilaudid PRN order    2-AKI on CKD stage IV Creatinine peaked to 4.8 compared to prior 3.5 Component of obstructive uropathy and suprapubic catheter dysfunction.  Urology replace suprapubic catheter. Also component of ATN.  Nephrology following, patient not felt to be a good long term candidate for dialysis.  Continue to have good urine output.  Creatinine trending down Monitor on IV lasix. Change to daily.  Cr increase to 4 -- stable today at 4.   3-Suprapubic catheter dysfunction: History of atonic bladder requiring suprapubic catheter placed approximately 1 month ago 80/31/2023. Presented with hematuria and poor urinary drainage. Evaluated by urology 9/27, it was discovered that suprapubic catheter tip was not in the bladder but adjacent to the small bowel.  Urology consulted and suprapubic catheter replaced with cystogram demonstrating appropriate placement within the bladder. Hematuria persist. Urology re-consulted, catheter was exchange. Patient will need out patient follow up .  Anemia, acute blood loss anemia postsurgery, hematuria and combination of anemia of chronic disease Patient has had multiple blood transfusion total of 3 Received IV iron 10/18 Monitor hemoglobin  Hematuria secondary to suprapubic catheter dysfunction  Urinary tract infection secondary to obstructive uropathy.  Completed course of IV antibiotics Hyponatremia: Resolved  Metabolic acidosis in the setting of CKD.  Start sodium  Bicarb.   Hypertension: Continue amlodipine carvedilol  Dementia : At risk for delirium.   Delirium precaution.   Wound dehiscence Following exploratory laparotomy, wound dehisced requiring patient to go back to the OR on 10/6.  Surgery following   Protein-calorie malnutrition, severe Nutrition Status: Nutrition Problem: Severe Malnutrition Etiology: chronic illness (dementia, CKD, internal hernia) Signs/Symptoms: severe fat depletion, severe muscle depletion on TPN See nurse documentation.   Nutrition Problem: Severe Malnutrition Etiology: chronic illness (dementia, CKD, internal hernia)    Signs/Symptoms: severe fat depletion, severe muscle depletion       Estimated body mass index is 20.74 kg/m as calculated from the following:   Height as of this encounter: '5\' 9"'$  (1.753 m).   Weight as of this encounter: 63.7 kg.   DVT prophylaxis: SCD Code Status: Full code Family Communication: Wife at bedside 10/124 Disposition Plan:  Status is: Inpatient Remains inpatient appropriate because: management of SBP post op complications.     Consultants:  Surgery Urology Nephrology  Palliative   Procedures:  Status post reduction of ventral hernia complicated by midline wound dehiscence he is status post wound closure.  Antimicrobials:    Subjective: He is alert, sitter resume last night because of agitation.  He is calm today. Denies pain   Objective: Vitals:   01/27/22 1940 01/28/22 0454 01/28/22 0500 01/28/22 0821  BP: (!) 155/67 135/81  (!) 121/48  Pulse: 78 81  73  Resp: '18 18  17  '$ Temp: 99.1 F (37.3 C) 97.7 F (36.5 C)  97.9 F (36.6 C)  TempSrc: Oral Oral    SpO2: 100% 97%  100%  Weight:   63.7 kg   Height:        Intake/Output Summary (Last 24 hours) at 01/28/2022 1703 Last data filed at 01/28/2022 0511 Gross per 24 hour  Intake 220 ml  Output 600 ml  Net -380 ml    Filed Weights   01/26/22 0500 01/27/22 0500 01/28/22 0500  Weight: 60.8  kg 60.8 kg 63.7 kg    Examination:  General exam: NAD Respiratory system: CTA Cardiovascular system: S,1  S 2 RRR Gastrointestinal system: BS present, soft nt , incision healed.  Central nervous system: Alert, follows command Extremities: No edema  Data Reviewed: I have personally reviewed following labs and imaging studies  CBC: Recent Labs  Lab 01/23/22 0402 01/24/22 0710 01/25/22 1236 01/26/22 0521 01/27/22 0706 01/28/22 0533  WBC 8.3  --  9.4 12.0* 9.7 9.7  HGB 7.2* 7.5* 7.1* 7.7* 7.1* 7.0*  HCT 22.8* 23.8* 21.8* 23.6* 22.5* 21.5*  MCV 86.0  --  85.5 84.9 86.2 86.0  PLT 183  --  218 232 223 102    Basic Metabolic Panel: Recent Labs  Lab 01/23/22 0402 01/24/22 0710 01/25/22 0555 01/26/22 0521 01/27/22 0701 01/28/22 0533  NA 135 136 136 139 139 138  K 4.3 4.5 4.3 4.4 4.3 4.3  CL 104 106 108 112* 110 112*  CO2 23 23 21* 20* 20* 17*  GLUCOSE 102* 107* 116* 118* 101* 78  BUN 113* 101* 105* 92* 114* 119*  CREATININE 3.97* 3.76* 3.70* 3.75* 4.09* 4.00*  CALCIUM 9.3 9.9 10.0 10.3 10.5* 9.8  MG 2.1  --   --   --  2.0  --   PHOS 2.7  --   --   --  2.8  --     GFR: Estimated Creatinine Clearance: 13.9 mL/min (A) (by C-G formula based on SCr of 4 mg/dL (H)). Liver Function Tests: Recent Labs  Lab 01/23/22 0402 01/27/22 0701  AST 20 28  ALT 12 12  ALKPHOS 76 66  BILITOT 0.6 0.2*  PROT 5.3* 5.8*  ALBUMIN 2.3* 2.4*    No results for input(s): "LIPASE", "AMYLASE" in the last 168 hours. No results for input(s): "AMMONIA" in the last 168 hours. Coagulation Profile: No results for input(s): "INR", "PROTIME" in the last 168 hours. Cardiac Enzymes: No results for input(s): "CKTOTAL", "CKMB", "CKMBINDEX", "TROPONINI" in the last 168 hours. BNP (last 3 results) No results for input(s): "PROBNP" in the last 8760 hours. HbA1C: No results for input(s): "HGBA1C" in the last 72 hours. CBG: Recent Labs  Lab 01/21/22 2305 01/22/22 0614  GLUCAP 97 93    Lipid  Profile: Recent Labs    01/27/22 0526 01/27/22 0701  TRIG 859* 50    Thyroid Function Tests: No results for input(s): "TSH", "T4TOTAL", "FREET4", "T3FREE", "THYROIDAB" in the last 72 hours. Anemia Panel: No results for input(s): "VITAMINB12", "FOLATE", "FERRITIN", "TIBC", "IRON", "RETICCTPCT" in the last 72 hours. Sepsis Labs: No results for input(s): "PROCALCITON", "LATICACIDVEN" in the last 168 hours.  No results found for this or any previous visit (from the past 240 hour(s)).       Radiology Studies: No results found.      Scheduled Meds:  Chlorhexidine Gluconate Cloth  6 each Topical Daily   feeding supplement  237 mL Oral TID BM   furosemide  20 mg Intravenous Daily   [START ON 01/29/2022] multivitamin with minerals  1 tablet Oral Daily   polyethylene glycol  17 g Oral BID   sodium bicarbonate  650 mg Oral BID   sodium chloride flush  10-40 mL Intracatheter Q12H   sodium chloride flush  3 mL Intravenous Q12H   Continuous Infusions:  sodium chloride Stopped (01/22/22 2041)     LOS: 24 days    Time spent: 35 minutes.     Elmarie Shiley, MD [facility]   If 7PM-7AM, please contact night-coverage [email or web address]  01/28/2022, 5:03 PM

## 2022-01-28 NOTE — Progress Notes (Signed)
Urology Inpatient Progress Note  Subjective: Urology has been asked to evaluate this patient again for persistent hematuria. No acute events overnight.  He is afebrile, VSS. Hemoglobin stable today, 7.0. Suprapubic catheter in place draining clear, red-brown urine containing small fragments of clot debris. Wife and sitter at the bedside today.  He is unable to contribute to HPI.  He is noted to be tugging at his SP tube despite mitts being in place.  Anti-infectives: Anti-infectives (From admission, onward)    Start     Dose/Rate Route Frequency Ordered Stop   01/10/22 1602  ceFAZolin (ANCEF) 2-4 GM/100ML-% IVPB       Note to Pharmacy: Trudie Reed S: cabinet override      01/10/22 1602 01/11/22 0414   01/07/22 1414  ceFAZolin (ANCEF) 2-4 GM/100ML-% IVPB       Note to Pharmacy: Olena Mater F: cabinet override      01/07/22 1414 01/07/22 1711   01/07/22 1345  ceFAZolin (ANCEF) IVPB 2g/100 mL premix       Note to Pharmacy: On call to OR   2 g 200 mL/hr over 30 Minutes Intravenous  Once 01/07/22 1256 01/07/22 1608   01/04/22 1530  ceFEPIme (MAXIPIME) 1 g in sodium chloride 0.9 % 100 mL IVPB        1 g 200 mL/hr over 30 Minutes Intravenous Every 24 hours 01/04/22 1445 01/12/22 2359   01/03/22 1445  piperacillin-tazobactam (ZOSYN) IVPB 3.375 g        3.375 g 100 mL/hr over 30 Minutes Intravenous  Once 01/03/22 1445 01/04/22 0813       Current Facility-Administered Medications  Medication Dose Route Frequency Provider Last Rate Last Admin   0.9 %  sodium chloride infusion  250 mL Intravenous PRN Herbert Pun, MD   Stopped at 01/22/22 2041   acetaminophen (TYLENOL) tablet 650 mg  650 mg Oral Q6H PRN Herbert Pun, MD   650 mg at 01/12/22 1529   Or   acetaminophen (TYLENOL) suppository 650 mg  650 mg Rectal Q6H PRN Herbert Pun, MD   650 mg at 01/26/22 1243   Chlorhexidine Gluconate Cloth 2 % PADS 6 each  6 each Topical Daily Wyvonnia Dusky, MD   6 each  at 01/28/22 0850   furosemide (LASIX) injection 20 mg  20 mg Intravenous Daily Regalado, Belkys A, MD   20 mg at 01/28/22 0848   haloperidol lactate (HALDOL) injection 1 mg  1 mg Intravenous Q6H PRN Regalado, Belkys A, MD   1 mg at 01/28/22 0544   hydrALAZINE (APRESOLINE) injection 10 mg  10 mg Intravenous Q6H PRN Wyvonnia Dusky, MD   10 mg at 01/21/22 2330   HYDROmorphone (DILAUDID) injection 0.5 mg  0.5 mg Intravenous Q3H PRN Regalado, Belkys A, MD       iohexol (OMNIPAQUE) 9 MG/ML oral solution 500 mL  500 mL Oral Once PRN Herbert Pun, MD   500 mL at 01/13/22 0959   ondansetron (ZOFRAN) injection 4 mg  4 mg Intravenous Q6H PRN Max Sane, MD       polyethylene glycol (MIRALAX / GLYCOLAX) packet 17 g  17 g Oral BID Herbert Pun, MD   17 g at 01/28/22 0848   sodium bicarbonate tablet 650 mg  650 mg Oral BID Regalado, Belkys A, MD   650 mg at 01/28/22 1059   sodium chloride flush (NS) 0.9 % injection 10-40 mL  10-40 mL Intracatheter Q12H Dahal, Marlowe Aschoff, MD   10 mL at 01/28/22  0849   sodium chloride flush (NS) 0.9 % injection 10-40 mL  10-40 mL Intracatheter PRN Terrilee Croak, MD   10 mL at 01/23/22 2117   sodium chloride flush (NS) 0.9 % injection 3 mL  3 mL Intravenous Q12H Herbert Pun, MD   3 mL at 01/28/22 0849   sodium chloride flush (NS) 0.9 % injection 3 mL  3 mL Intravenous PRN Herbert Pun, MD       Objective: Vital signs in last 24 hours: Temp:  [97.7 F (36.5 C)-99.1 F (37.3 C)] 97.9 F (36.6 C) (10/24 0821) Pulse Rate:  [71-81] 73 (10/24 0821) Resp:  [17-18] 17 (10/24 0821) BP: (111-155)/(48-81) 121/48 (10/24 0821) SpO2:  [97 %-100 %] 100 % (10/24 0821) Weight:  [63.7 kg] 63.7 kg (10/24 0500)  Intake/Output from previous day: 10/23 0701 - 10/24 0700 In: 460 [P.O.:460] Out: 2050 [Urine:2050] Intake/Output this shift: No intake/output data recorded.  Physical Exam Vitals and nursing note reviewed.  Constitutional:      General:  He is not in acute distress.    Appearance: He is not ill-appearing, toxic-appearing or diaphoretic.  HENT:     Head: Normocephalic and atraumatic.  Pulmonary:     Effort: Pulmonary effort is normal. No respiratory distress.  Abdominal:     General: Abdomen is flat.     Palpations: Abdomen is soft.  Skin:    General: Skin is warm and dry.  Neurological:     Mental Status: Mental status is at baseline.    Lab Results:  Recent Labs    01/27/22 0706 01/28/22 0533  WBC 9.7 9.7  HGB 7.1* 7.0*  HCT 22.5* 21.5*  PLT 223 192   BMET Recent Labs    01/27/22 0701 01/28/22 0533  NA 139 138  K 4.3 4.3  CL 110 112*  CO2 20* 17*  GLUCOSE 101* 78  BUN 114* 119*  CREATININE 4.09* 4.00*  CALCIUM 10.5* 9.8   Assessment & Plan: 76 year old male with CKD, dementia, and chronic urinary retention managed with suprapubic catheter admitted with bladder injury due to SP tube placement through the bladder posteriorly who developed SBO requiring ex lap with reduction of internal hernia complicated by wound dehiscence s/p wound closure with Dr. Windell Moment.  Urology has been asked to see him again regarding his ongoing gross hematuria. He is also due for SPT exchange.  Differential for gross hematuria includes recent bladder injury, relatively recent SPT placement, BPH, Foley trauma in the setting of dementia and witnessed Foley tugs, and urologic malignancy.  On exam today, he does not appear to have any significant active bleeding and his blood counts are stable. Notably, there was a region of hyperdense material within the left renal upper collecting system on CT scans dated 9/29; repeat imaging in 3 months was recommended.  With no evidence of clinically significant, active bleeding today, no indication for more aggressive intervention including CBI. A hematuria workup on this patient may be deferred to the outpatient basis, however it will pose a significant challenge, with his renal function  barring the use of iodinated contrast and concerns about his ability to tolerate MR imaging due to his dementia. We may consider a repeat noncontrast CT scan in the future, however this is insufficient to completely evaluate him for sources of hematuria and the recommended bilateral retrograde pyelograms to complete such a workup would require additional anesthesia, which is concerning in light of his prolonged SBO during this hospitalization. Ultimately, conservative management in light of his  stable blood counts would be an appropriate option.  Zara Council and I exchanged his suprapubic catheter at the bedside today. Using sterile technique, 43m of water was drained from the balloon, a 16FR Council tip foley cath was removed from the tract without difficulty.  Site was cleaned and prepped in a sterile fashion with betadine.  A 18FR Silastic foley cath was replaced into the tract, 10 ml of sterile water was inflated into the balloon, and no urine return was noted. The catheter was gently irrigated with 30ccs of sterile water with no return; the balloon was deflated, catheter was removed, and was reinserted. Urine return was then noted at the balloon was reinflated with 10ccs of sterile water. The catheter was gently irrigated again with 60ccs of sterile water and several small fragments of clot material were drained from the bladder. Upon conclusion of the procedure, the SPT was draining pink-tinged urine and a night bag was attached for drainage.  Patient tolerated well.   Recommendations: -Gently irrigate SPT as needed, exchanged today as above -Outpatient follow-up in 1 month for SPT change and hematuria follow-up  SDebroah Loop PA-C 01/28/2022

## 2022-01-28 NOTE — Progress Notes (Signed)
       CROSS COVER NOTE  NAME: [name] MRN: [id number] DOB : [birth year] ATTENDING PHYSICIAN: Elmarie Shiley, MD    Date of Service   01/28/2022   HPI/Events of Note   Notified by nursing staff that Mr Petroni has frequently been attempting to get out of bed tonight, is difficult to redirect, and he has pulled out his PICC line with mitts on. RN and IV team RN verified PICC line was intact. PRN Haldol has not been administered. PIV inserted by IV team RN.  Interventions   Assessment/Plan:  Air cabin crew Continue PRN Haldol    This document was prepared using Dragon voice recognition software and may include unintentional dictation errors.  Neomia Glass DNP, MBA, FNP-BC Nurse Practitioner [facility] Pager [phone]

## 2022-01-29 DIAGNOSIS — N184 Chronic kidney disease, stage 4 (severe): Secondary | ICD-10-CM | POA: Diagnosis not present

## 2022-01-29 DIAGNOSIS — N179 Acute kidney failure, unspecified: Secondary | ICD-10-CM | POA: Diagnosis not present

## 2022-01-29 LAB — HEMOGLOBIN AND HEMATOCRIT, BLOOD
HCT: 21.6 % — ABNORMAL LOW (ref 39.0–52.0)
Hemoglobin: 7 g/dL — ABNORMAL LOW (ref 13.0–17.0)

## 2022-01-29 NOTE — Progress Notes (Signed)
PROGRESS NOTE    Troy Berry.  OYD:[id number] DOB: 1945/06/12 DOA: 01/03/2022 PCP: [facility]   Brief Narrative: This 76 year old Male with PMH significant for atonic bladder status post suprapubic catheter, stage IV CKD, hypertension presents to the ED on 01/04/22 with complaint of suprapubic catheter dysfunction as well as abdominal distention.  Patient was found to have possible SBO versus ileus.  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  General surgery and urology were consulted.  Patient was started on cefepime to cover for UTI.  NG tube placed for abdominal distention.  Patient did not improve with conservative management and subsequently underwent exploratory laparoscopy and reduction of internal hernia on 01/07/22. Hospital course complicated by serous drainage through the wound, wound dehiscence and omental exposure. On 01/10/22 Patient was taken to the OR for wound closure.  He was noted to have distended bladder despite suprapubic catheter.  Bloody urine was aspirated. There was no evidence of bowel obstruction.  On 01/11/22 decision was made to start patient on TPN. NG tube has been removed, has had BM. Diet advanced to soft.  Awaiting stability of Hb and renal function.    Assessment & Plan:   Principal Problem:   Acute renal failure superimposed on stage 4 chronic kidney disease (HCC) Active Problems:   SBO (small bowel obstruction) (HCC)   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HCC)   Anemia   Hematuria   Urinary tract infection   Metabolic acidosis   Essential hypertension   Gross hematuria   Dementia without behavioral disturbance (HCC)   Hypokalemia   Acute blood loss anemia   Protein-calorie malnutrition, severe   Wound dehiscence   Small bowel obstruction: > Resolved. -CT shows some  evidence of bowel dilation with no focal point demonstrated..   -CT cystogram demonstrated persistent small bowel dilation with evidence of focal transition in the central mesentery.   - Patient did not improve with conservative management. He underwent exploratory laparotomy and reduction of hernia on 01/07/22. --Status post reduction of ventral hernia complicated by midline wound dehiscence he is status post wound closure. -TPN has been discontinued.  -NG tube has been removed. Diet advanced to Soft.  -Continue adequate pain control with Dilaudid as needed.   AKI on CKD stage IV: Baseline creatinine 3.5, creatinine peaked at 4.8 Component of obstructive uropathy and suprapubic catheter dysfunction.   Urology consulted, replaced suprapubic catheter. Nephrology following, patient not felt to be a good long term candidate for dialysis.   He continued to have good urine output.  Creatinine trending down 4.0 Continue IV Lasix 20 mg daily. Monitor serum creatinine.  Suprapubic catheter dysfunction: History of atonic bladder requiring suprapubic catheter placed approximately 1 month ago on 12/05/2021. Presented with hematuria and poor urinary drainage. Evaluated by urology 01/01/22, it was discovered that suprapubic catheter tip was not in the bladder but adjacent to the small bowel. Urology consulted and suprapubic catheter replaced with cystogram demonstrating appropriate placement within the bladder. Hematuria persist. Urology re-consulted, catheter was exchanged. Patient will need out patient follow up . Hematuria Improved.   Acute blood loss anemia post surgery: Anemia likely multifactorial, anemia of chronic disease. Patient has had multiple blood transfusion total of 3 so far. Received IV iron 01/22/22 Monitor H&H.  Hemoglobin remains around 7.   Hematuria >  secondary to suprapubic catheter dysfunction. Continue to  monitor   UTI secondary to obstructive uropathy: He completed course  of IV antibiotics.  Hyponatremia> resolved.   Metabolic acidosis, in the setting of CKD stage IV: Continue sodium bicarbonate tablets   Essential hypertension: Continue Coreg, amlodipine.  Dementia : At risk for delirium.   Continue Delirium precaution.    Wound dehiscence: Hospital course complicated by wound dehiscence , s/p OR 10/6  Wound is now stable and healing well.   Protein-calorie malnutrition, severe Nutrition Status: Nutrition Problem: Severe Malnutrition Etiology: chronic illness (dementia, CKD, internal hernia) Signs/Symptoms: severe fat depletion, severe muscle depletion on TPN See nurse documentation.  Estimated body mass index is 20.74 kg/m as calculated from the following:   Height as of this encounter: '5\' 9"'$  (1.753 m).   Weight as of this encounter: 63.7 kg.   DVT prophylaxis: SCDs Code Status: Full code Family Communication: Wife at bed side Disposition Plan:   Status is: Inpatient Remains inpatient appropriate because: Admitted for small bowel obstruction,  underwent exploratory laparotomy, hospital course complicated by wound dehiscence,  underwent revision.  SBO resolved.  Anticipated discharge once renal functions and hemoglobin stabilizes.   Consultants:  General surgery Urology Nephrology Palliative care  Procedures: Status post reduction of ventral hernia complicated by midline wound dehiscence he is status post wound closure.  Antimicrobials: Anti-infectives (From admission, onward)    Start     Dose/Rate Route Frequency Ordered Stop   01/10/22 1602  ceFAZolin (ANCEF) 2-4 GM/100ML-% IVPB       Note to Pharmacy: Trudie Reed S: cabinet override      01/10/22 1602 01/11/22 0414   01/07/22 1414  ceFAZolin (ANCEF) 2-4 GM/100ML-% IVPB       Note to Pharmacy: Olena Mater F: cabinet override      01/07/22 1414 01/07/22 1711   01/07/22 1345  ceFAZolin (ANCEF) IVPB 2g/100 mL premix       Note to Pharmacy: On call to OR   2 g 200 mL/hr  over 30 Minutes Intravenous  Once 01/07/22 1256 01/07/22 1608   01/04/22 1530  ceFEPIme (MAXIPIME) 1 g in sodium chloride 0.9 % 100 mL IVPB        1 g 200 mL/hr over 30 Minutes Intravenous Every 24 hours 01/04/22 1445 01/12/22 2359   01/03/22 1445  piperacillin-tazobactam (ZOSYN) IVPB 3.375 g        3.375 g 100 mL/hr over 30 Minutes Intravenous  Once 01/03/22 1445 01/04/22 0813      Subjective: Patient was seen and examined at bedside.  Overnight events noted.   Patient reports he is doing much better.  He is tolerating soft bland diet, states abdominal pain has improved.   He is able to pass flatus.  Objective: Vitals:   01/28/22 1739 01/28/22 1940 01/29/22 0345 01/29/22 0717  BP: 108/60 (!) 115/57 (!) 147/59 135/64  Pulse: 81 75 69 71  Resp: '17 18 18 17  '$ Temp: 97.7 F (36.5 C) 97.9 F (36.6 C) 98.3 F (36.8 C) 98 F (36.7 C)  TempSrc:  Oral Oral Oral  SpO2: 97% 100% 100% 100%  Weight:   62.7 kg   Height:        Intake/Output Summary (Last 24 hours) at 01/29/2022 1158 Last data filed at 01/29/2022 0927 Gross per 24 hour  Intake 120 ml  Output 1800 ml  Net -1680 ml   Filed Weights   01/27/22 0500 01/28/22 0500 01/29/22 0345  Weight: 60.8 kg 63.7 kg 62.7 kg    Examination:  General  exam: Appears comfortable, not in any acute distress, very deconditioned. Respiratory system: CTA bilaterally, respiratory effort normal, RR 15 Cardiovascular system: S1 & S2 heard, regular rate and rhythm, no murmur. Gastrointestinal system: Abdomen is soft, non tender, non distended, BS+, surgical scar noted. Central nervous system: Alert and oriented  x 2. No focal neurological deficits. Extremities: No edema, no cyanosis, no clubbing. Skin: No rashes, lesions or ulcers Psychiatry: Judgement and insight appear normal. Mood & affect appropriate.     Data Reviewed: I have personally reviewed following labs and imaging studies  CBC: Recent Labs  Lab 01/23/22 0402 01/24/22 0710  01/25/22 1236 01/26/22 0521 01/27/22 0706 01/28/22 0533  WBC 8.3  --  9.4 12.0* 9.7 9.7  HGB 7.2* 7.5* 7.1* 7.7* 7.1* 7.0*  HCT 22.8* 23.8* 21.8* 23.6* 22.5* 21.5*  MCV 86.0  --  85.5 84.9 86.2 86.0  PLT 183  --  218 232 223 341   Basic Metabolic Panel: Recent Labs  Lab 01/23/22 0402 01/24/22 0710 01/25/22 0555 01/26/22 0521 01/27/22 0701 01/28/22 0533  NA 135 136 136 139 139 138  K 4.3 4.5 4.3 4.4 4.3 4.3  CL 104 106 108 112* 110 112*  CO2 23 23 21* 20* 20* 17*  GLUCOSE 102* 107* 116* 118* 101* 78  BUN 113* 101* 105* 92* 114* 119*  CREATININE 3.97* 3.76* 3.70* 3.75* 4.09* 4.00*  CALCIUM 9.3 9.9 10.0 10.3 10.5* 9.8  MG 2.1  --   --   --  2.0  --   PHOS 2.7  --   --   --  2.8  --    GFR: Estimated Creatinine Clearance: 13.7 mL/min (A) (by C-G formula based on SCr of 4 mg/dL (H)). Liver Function Tests: Recent Labs  Lab 01/23/22 0402 01/27/22 0701  AST 20 28  ALT 12 12  ALKPHOS 76 66  BILITOT 0.6 0.2*  PROT 5.3* 5.8*  ALBUMIN 2.3* 2.4*   No results for input(s): "LIPASE", "AMYLASE" in the last 168 hours. No results for input(s): "AMMONIA" in the last 168 hours. Coagulation Profile: No results for input(s): "INR", "PROTIME" in the last 168 hours. Cardiac Enzymes: No results for input(s): "CKTOTAL", "CKMB", "CKMBINDEX", "TROPONINI" in the last 168 hours. BNP (last 3 results) No results for input(s): "PROBNP" in the last 8760 hours. HbA1C: No results for input(s): "HGBA1C" in the last 72 hours. CBG: No results for input(s): "GLUCAP" in the last 168 hours. Lipid Profile: Recent Labs    01/27/22 0526 01/27/22 0701  TRIG 859* 50   Thyroid Function Tests: No results for input(s): "TSH", "T4TOTAL", "FREET4", "T3FREE", "THYROIDAB" in the last 72 hours. Anemia Panel: No results for input(s): "VITAMINB12", "FOLATE", "FERRITIN", "TIBC", "IRON", "RETICCTPCT" in the last 72 hours. Sepsis Labs: No results for input(s): "PROCALCITON", "LATICACIDVEN" in the last 168  hours.  No results found for this or any previous visit (from the past 240 hour(s)).   Radiology Studies: No results found.  Scheduled Meds:  Chlorhexidine Gluconate Cloth  6 each Topical Daily   feeding supplement  237 mL Oral TID BM   furosemide  20 mg Intravenous Daily   multivitamin with minerals  1 tablet Oral Daily   polyethylene glycol  17 g Oral BID   sodium bicarbonate  650 mg Oral BID   sodium chloride flush  10-40 mL Intracatheter Q12H   sodium chloride flush  3 mL Intravenous Q12H   Continuous Infusions:  sodium chloride Stopped (01/22/22 2041)     LOS: 25 days  Time spent: 50 mins    Shawna Clamp, MD [facility]   If 7PM-7AM, please contact night-coverage

## 2022-01-29 NOTE — Progress Notes (Signed)
Occupational Therapy Treatment Patient Details Name: Troy Berry. MRN: [id number] DOB: 1945/12/18 Today's Date: 01/29/2022   History of present illness Patient is a 76 y.o.  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 He was admitted on 01/03/2022 for Acute on chronic renal failure, small bowel obstruction  s/p reduction of internal hernia. patient with midline wound dehiscence status post wound closure on 10/6.   OT comments  Upon entering session, pt resting in bed with sitter present. Pt requesting to use toilet. Education provided on catheter if needing to urinate, however, pt reporting needing to have BM. Pt completed SPT to [facility] with Mod A. Pt not tolerating sitting on BSC very long and attempting to stand up. Pt determined to complete functional mobility to bathroom and able to do so with Min guard using RW and chair follow for safety. No LOB. BSC frame placed over regular toilet. He required Mod A to complete toilet transfer using hand rails on BSC. Ultimately unable to have a BM and returned to bed. Repositioning provided to prevent R lateral lean in supine. Pt left as received with all needs in reach. He required VC for sequencing and safety awareness throughout session. Family present at end of session. Pt is making progress toward goal completion. D/C recommendation remains appropriate. OT will continue to follow acutely.    Recommendations for follow up therapy are one component of a multi-disciplinary discharge planning process, led by the attending physician.  Recommendations may be updated based on patient status, additional functional criteria and insurance authorization.    Follow Up Recommendations  Skilled nursing-short term rehab (<3 hours/day)    Assistance Recommended at Discharge Frequent or constant Supervision/Assistance  Patient can return home with the following  A lot  of help with walking and/or transfers;A lot of help with bathing/dressing/bathroom;Assistance with feeding;Assistance with cooking/housework;Direct supervision/assist for medications management;Direct supervision/assist for financial management;Assist for transportation   Equipment Recommendations  Other (comment) (defer to next venue of care)    Recommendations for Other Services      Precautions / Restrictions Precautions Precautions: Fall Precaution Comments: suprapubic catheter; abdominal incision Restrictions Weight Bearing Restrictions: No       Mobility Bed Mobility Overal bed mobility: Needs Assistance Bed Mobility: Supine to Sit, Sit to Supine, Rolling Rolling: Max assist   Supine to sit: Min assist, HOB elevated Sit to supine: Min assist   General bed mobility comments: Assist for trunk elevation and to manage BLEs, VC for technique, assist to scoot hips towards EOB using chuck pad, +2 assist required to scoot pt up towards [facility]    Transfers Overall transfer level: Needs assistance Equipment used: Rolling walker (2 wheels) Transfers: Bed to chair/wheelchair/BSC, Sit to/from Stand Sit to Stand: Mod assist Stand pivot transfers: Mod assist         General transfer comment: VC for hand placement and safety, assist for controlled descent onto transfer surface. STS 3x from EOB, BSC, and BSC frame over regular toilet.     Balance Overall balance assessment: Needs assistance Sitting-balance support: Bilateral upper extremity supported, Feet supported Sitting balance-[name]: Fair     Standing balance support: Bilateral upper extremity supported, Reliant on assistive device for balance Standing balance-[name]: Fair                             ADL either performed or assessed with clinical judgement  ADL Overall ADL's : Needs assistance/impaired                         Toilet Transfer: BSC/3in1;Regular Toilet;Rolling walker (2  wheels);Ambulation;Stand-pivot;Moderate assistance;Cueing for safety;Cueing for sequencing Toilet Transfer Details (indicate cue type and reason): initally completed SPT to [facility], then completing functional mobility to bathroom and BSC frame placed over regular toilet         Functional mobility during ADLs: Min guard;Rolling walker (2 wheels);Cueing for safety;Cueing for sequencing;Minimal assistance (to complete functional mobility to the bathroom, Min A for RW management, chair follow for safety) General ADL Comments: unable to have BM    Extremity/Trunk Assessment Upper Extremity Assessment Upper Extremity Assessment: Generalized weakness;Difficult to assess due to impaired cognition   Lower Extremity Assessment Lower Extremity Assessment: Generalized weakness;Difficult to assess due to impaired cognition   Cervical / Trunk Assessment Cervical / Trunk Assessment: Other exceptions Cervical / Trunk Exceptions: forward head/shoulders    Vision       Perception     Praxis      Cognition Arousal/Alertness: Awake/alert Behavior During Therapy: Flat affect Overall Cognitive Status: Impaired/Different from baseline Area of Impairment: Following commands, Problem solving, Attention, Safety/judgement, Orientation, Awareness                 Orientation Level: Disoriented to, Place, Time, Situation, Person (Oriented to name only) Current Attention Level: Focused   Following Commands: Follows one step commands inconsistently Safety/Judgement: Decreased awareness of safety, Decreased awareness of deficits   Problem Solving: Slow processing, Decreased initiation, Difficulty sequencing, Requires verbal cues, Requires tactile cues General Comments: VC for safety awareness throughout, attempting to scoot down to end of bed past hand rails and stand without RW.        Exercises      Shoulder Instructions       General Comments      Pertinent Vitals/ Pain       Pain  Assessment Pain Assessment: No/denies pain  Home Living                                          Prior Functioning/Environment              Frequency  Min 2X/week        Progress Toward Goals  OT Goals(current goals can now be found in the care plan section)  Progress towards OT goals: Progressing toward goals  Acute Rehab OT Goals Patient Stated Goal: to get stronger OT Goal Formulation: With family Time For Goal Achievement: 02/10/22 Potential to Achieve Goals: [place] Discharge plan remains appropriate;Frequency remains appropriate    Co-evaluation                 AM-PAC OT "6 Clicks" Daily Activity     Outcome Measure   Help from another person eating meals?: A Little Help from another person taking care of personal grooming?: A Lot Help from another person toileting, which includes using toliet, bedpan, or urinal?: A Lot Help from another person bathing (including washing, rinsing, drying)?: A Lot Help from another person to put on and taking off regular upper body clothing?: A Lot Help from another person to put on and taking off regular lower body clothing?: A Lot 6 Click Score: 13    End of Session Equipment Utilized During Treatment: Gait belt;Rolling walker (  2 wheels)  OT Visit Diagnosis: Unsteadiness on feet (R26.81);Repeated falls (R29.6);Muscle weakness (generalized) (M62.81)   Activity Tolerance Patient tolerated treatment well   Patient Left in bed;with call bell/phone within reach;with bed alarm set;with family/visitor present;with nursing/sitter in room   Nurse Communication Mobility status        Time: 7897-8478 OT Time Calculation (min): 24 min  Charges: OT General Charges $OT Visit: 1 Visit OT Treatments $Self Care/Home Management : 23-37 mins  [facility] MS, OTR/L ascom [phone]  01/29/22, 6:56 PM

## 2022-01-29 NOTE — Progress Notes (Signed)
[facility]  PROGRESS NOTE   Subjective:   Patient seen sitting up in bed, wife at bedside Safety sitter remains at bedside NG tube has been removed Patient not receiving TPN Foley catheter in place, hematuria remains Wife states appetite has improved  Objective:  Vital signs: Blood pressure 135/64, pulse 71, temperature 98 F (36.7 C), temperature source Oral, resp. rate 17, height '5\' 9"'$  (1.753 m), weight 62.7 kg, SpO2 100 %.  Intake/Output Summary (Last 24 hours) at 01/29/2022 1403 Last data filed at 01/29/2022 0927 Gross per 24 hour  Intake 120 ml  Output 1800 ml  Net -1680 ml    Filed Weights   01/27/22 0500 01/28/22 0500 01/29/22 0345  Weight: 60.8 kg 63.7 kg 62.7 kg     Physical Exam: General:  No acute distress  Head:  Normocephalic, atraumatic. Moist oral mucosal membranes  Eyes:  Anicteric  Lungs:   Clear to auscultation, normal effort  Heart:  S1S2 no rubs  Abdomen:   Soft, nontender, bowel sounds present  Extremities: Trace peripheral edema.  Neurologic: Alert and following simple commands  Skin:  No lesions  Access: None    Basic Metabolic Panel: Recent Labs  Lab 01/23/22 0402 01/24/22 0710 01/25/22 0555 01/26/22 0521 01/27/22 0701 01/28/22 0533  NA 135 136 136 139 139 138  K 4.3 4.5 4.3 4.4 4.3 4.3  CL 104 106 108 112* 110 112*  CO2 23 23 21* 20* 20* 17*  GLUCOSE 102* 107* 116* 118* 101* 78  BUN 113* 101* 105* 92* 114* 119*  CREATININE 3.97* 3.76* 3.70* 3.75* 4.09* 4.00*  CALCIUM 9.3 9.9 10.0 10.3 10.5* 9.8  MG 2.1  --   --   --  2.0  --   PHOS 2.7  --   --   --  2.8  --      CBC: Recent Labs  Lab 01/23/22 0402 01/24/22 0710 01/25/22 1236 01/26/22 0521 01/27/22 0706 01/28/22 0533  WBC 8.3  --  9.4 12.0* 9.7 9.7  HGB 7.2* 7.5* 7.1* 7.7* 7.1* 7.0*  HCT 22.8* 23.8* 21.8* 23.6* 22.5* 21.5*  MCV 86.0  --  85.5 84.9 86.2 86.0  PLT 183  --  218 232 223 192      Urinalysis: No results for input(s): "COLORURINE",  "LABSPEC", "PHURINE", "GLUCOSEU", "HGBUR", "BILIRUBINUR", "KETONESUR", "PROTEINUR", "UROBILINOGEN", "NITRITE", "LEUKOCYTESUR" in the last 72 hours.  Invalid input(s): "APPERANCEUR"    Imaging: No results found.   Medications:    sodium chloride Stopped (01/22/22 2041)    Chlorhexidine Gluconate Cloth  6 each Topical Daily   feeding supplement  237 mL Oral TID BM   furosemide  20 mg Intravenous Daily   multivitamin with minerals  1 tablet Oral Daily   polyethylene glycol  17 g Oral BID   sodium bicarbonate  650 mg Oral BID   sodium chloride flush  10-40 mL Intracatheter Q12H   sodium chloride flush  3 mL Intravenous Q12H    Assessment/ Plan:     Principal Problem:   Acute renal failure superimposed on stage 4 chronic kidney disease (HCC) Active Problems:   Urinary tract infection   Anemia   Essential hypertension   Gross hematuria   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HCC)   Metabolic acidosis   Hematuria   SBO (small bowel obstruction) (HCC)   Dementia without behavioral disturbance (HCC)   Hypokalemia   Acute blood loss anemia   Protein-calorie malnutrition, severe   Wound dehiscence  [name]  [name]. is a 76 y.o.  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  Patient has experienced fluctuating renal function during this admission.  Creatinine remains stable at 4, adequate urine output recorded.  BUN remains elevated, will continue to monitor.   #2[name] hematuria: S/p suprapubic catheter placement.  Patient has atonic bladder.  Urology following.   #3: Small bowel obstruction/postop ileus: S/p reduction of hernia complicated by wound dehiscence.   Patient tolerating oral intake, TPN stopped yesterday.  Surgery recommending bowel regimen to prevent constipation.   #4: Anemia with chronic kidney disease: Patient had multiple blood transfusions.  Hemoglobin borderline.  We will continue to monitor    LOS: Troy Berry 10/25/20232:03 PM

## 2022-01-29 NOTE — Progress Notes (Signed)
Patient ID: Troy Beals., male   DOB: [birth year], 76 y.o.   MRN: [id number]     [place] Hospital Day(s): 25.   Interval History: Patient seen and examined, no acute events or new complaints overnight.  Patient was laying down awake, alert and comfortable.  He was very responsive to questions and looks very comfortable.  As per nurse there has been no episode of nausea or vomiting.  Continue having bowel movement.  Vital signs in last 24 hours: [min-max] current  Temp:  [97.7 F (36.5 C)-98.3 F (36.8 C)] 98 F (36.7 C) (10/25 0717) Pulse Rate:  [69-81] 71 (10/25 0717) Resp:  [17-18] 17 (10/25 0717) BP: (108-147)/(48-64) 135/64 (10/25 0717) SpO2:  [97 %-100 %] 100 % (10/25 0717) Weight:  [62.7 kg] 62.7 kg (10/25 0345)     Height: '5\' 9"'$  (175.3 cm) Weight: 62.7 kg BMI (Calculated): 20.4   Physical Exam:  Constitutional: alert, cooperative and no distress  Respiratory: breathing non-labored at rest  Cardiovascular: regular rate and sinus rhythm  Gastrointestinal: soft, non-tender, and non-distended  Labs:     Latest Ref Rng & Units 01/28/2022    5:33 AM 01/27/2022    7:06 AM 01/26/2022    5:21 AM  CBC  WBC 4.0 - 10.5 K/uL 9.7  9.7  12.0   Hemoglobin 13.0 - 17.0 g/dL 7.0  7.1  7.7   Hematocrit 39.0 - 52.0 % 21.5  22.5  23.6   Platelets 150 - 400 K/uL 192  223  232       Latest Ref Rng & Units 01/28/2022    5:33 AM 01/27/2022    7:01 AM 01/26/2022    5:21 AM  CMP  Glucose 70 - 99 mg/dL 78  101  118   BUN 8 - 23 mg/dL 119  114  92   Creatinine 0.61 - 1.24 mg/dL 4.00  4.09  3.75   Sodium 135 - 145 mmol/L 138  139  139   Potassium 3.5 - 5.1 mmol/L 4.3  4.3  4.4   Chloride 98 - 111 mmol/L 112  110  112   CO2 22 - 32 mmol/L '17  20  20   '$ Calcium 8.9 - 10.3 mg/dL 9.8  10.5  10.3   Total Protein 6.5 - 8.1 g/dL  5.8    Total Bilirubin 0.3 - 1.2 mg/dL  0.2    Alkaline Phos 38 - 126 U/L  66    AST 15 - 41 U/L  28    ALT 0 - 44 U/L  12      Imaging  studies: No new pertinent imaging studies   Assessment/Plan:  76 y.o. male with small bowel obstruction 22 Day Post-Op s/p reduction of internal hernia.  The was complicated by midline wound dehiscence status post wound closure postop day #19   -Ileus resolved.  Patient tolerating regular diet -The wound is healed, staples are removed -Continue medical management as per primary team.  From the surgical standpoint patient can be discharged when medically stable -As he has been more than 2 weeks postsurgery here and the wound is healed staples removed and tolerating diet, no surgical outpatient follow-up will be needed  Arnold Long, MD

## 2022-01-29 NOTE — Progress Notes (Signed)
PT Cancellation Note  Patient Details Name: Troy Berry. MRN: [id number] DOB: 1946-03-08   Cancelled Treatment:    Reason Eval/Treat Not Completed: Other (comment) Pt with Hgb 7.0, no transfusion ordered at this time.  Pt was having haircut on PTs arrival.  Will try back as time allows and pt is appropriate for PT.  Kreg Shropshire, DPT 01/29/2022, 5:23 PM

## 2022-01-29 NOTE — TOC Progression Note (Signed)
Transition of Care (TOC) - Progression Note    Patient Details  Name: Troy Berry. MRN: [id number] Date of Birth: [birth year]  Transition of Care [facility] CM/SW Contact  Beverly Sessions, RN Phone Number: 01/29/2022, 3:11 PM  Clinical Narrative:      MD to discontinue sitter.  Per MD anticipated patient will be medically ready for discharge.  Notified wife and she would like to accept bed at [facility] Accepted in hub and notified allison at [place].  Allsion to start [employer] today    Expected Discharge Plan:  (TBD) Barriers to Discharge: Continued Medical Work up  Expected Discharge Plan and Services Expected Discharge Plan:  (TBD)     Post Acute Care Choice:  (TBD) Living arrangements for the past 2 months: Single Family Home                                       Social Determinants of Health (SDOH) Interventions Transportation Interventions: Inpatient TOC, Other (Comment) (Transportation provided through [employer])  Readmission Risk Interventions     No data to display

## 2022-01-30 DIAGNOSIS — N179 Acute kidney failure, unspecified: Secondary | ICD-10-CM | POA: Diagnosis not present

## 2022-01-30 DIAGNOSIS — N184 Chronic kidney disease, stage 4 (severe): Secondary | ICD-10-CM | POA: Diagnosis not present

## 2022-01-30 LAB — COMPREHENSIVE METABOLIC PANEL
ALT: 16 U/L (ref 0–44)
AST: 21 U/L (ref 15–41)
Albumin: 2.5 g/dL — ABNORMAL LOW (ref 3.5–5.0)
Alkaline Phosphatase: 66 U/L (ref 38–126)
Anion gap: 7 (ref 5–15)
BUN: 100 mg/dL — ABNORMAL HIGH (ref 8–23)
CO2: 21 mmol/L — ABNORMAL LOW (ref 22–32)
Calcium: 9.2 mg/dL (ref 8.9–10.3)
Chloride: 108 mmol/L (ref 98–111)
Creatinine, Ser: 3.66 mg/dL — ABNORMAL HIGH (ref 0.61–1.24)
GFR, Estimated: 16 mL/min — ABNORMAL LOW (ref >60)
Glucose, Bld: 86 mg/dL (ref 70–99)
Potassium: 4.2 mmol/L (ref 3.5–5.1)
Sodium: 136 mmol/L (ref 135–145)
Total Bilirubin: 0.6 mg/dL (ref 0.3–1.2)
Total Protein: 5.9 g/dL — ABNORMAL LOW (ref 6.5–8.1)

## 2022-01-30 LAB — TRIGLYCERIDES: Triglycerides: 57 mg/dL (ref <150)

## 2022-01-30 LAB — PHOSPHORUS: Phosphorus: 4.1 mg/dL (ref 2.5–4.6)

## 2022-01-30 LAB — MAGNESIUM: Magnesium: 2.2 mg/dL (ref 1.7–2.4)

## 2022-01-30 NOTE — Progress Notes (Signed)
Physical Therapy Treatment Patient Details Name: Troy Berry. MRN: [id number] DOB: 30-Apr-1945 Today's Date: 01/30/2022   History of Present Illness Patient is a 76 y.o.  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 He was admitted on 01/03/2022 for Acute on chronic renal failure, small bowel obstruction  s/p reduction of internal hernia. patient with midline wound dehiscence status post wound closure on 10/6.    PT Comments    Pt resting in bed upon PT arrival; agreeable to PT session; pt's wife present.  During session pt min to mod assist for bed mobility; min to mod assist with transfers using RW; and CGA to min assist x2 (for safety) ambulating 40 feet with RW use.  Limited distance ambulating d/t pt fatigue.  Will continue to focus on strengthening, balance, and progressive functional mobility during hospitalization.    Recommendations for follow up therapy are one component of a multi-disciplinary discharge planning process, led by the attending physician.  Recommendations may be updated based on patient status, additional functional criteria and insurance authorization.  Follow Up Recommendations  Skilled nursing-short term rehab (<3 hours/day) Can patient physically be transported by private vehicle: No   Assistance Recommended at Discharge Frequent or constant Supervision/Assistance  Patient can return home with the following Two people to help with walking and/or transfers;A lot of help with bathing/dressing/bathroom;Assist for transportation;Help with stairs or ramp for entrance;Direct supervision/assist for medications management;Assistance with feeding;Assistance with cooking/housework;Direct supervision/assist for financial management   Equipment Recommendations  Rolling walker (2 wheels);BSC/3in1;Wheelchair (measurements PT);Wheelchair cushion (measurements PT);Hospital bed     Recommendations for Other Services       Precautions / Restrictions Precautions Precautions: Fall Precaution Comments: suprapubic catheter; abdominal incision Restrictions Weight Bearing Restrictions: No     Mobility  Bed Mobility Overal bed mobility: Needs Assistance Bed Mobility: Supine to Sit, Sit to Supine     Supine to sit: Min assist, Mod assist Sit to supine: Mod assist   General bed mobility comments: vc's for technique; assist to initiate movement semi-supine to sitting edge of bed    Transfers Overall transfer level: Needs assistance Equipment used: Rolling walker (2 wheels) Transfers: Sit to/from Stand Sit to Stand: Min assist, Mod assist           General transfer comment: x3 trials standing from bed (1st 2 trials min assist and 3rd trial mod assist); vc's and tactile cues for UE/LE placement    Ambulation/Gait Ambulation/Gait assistance: Min guard, Min assist, +2 safety/equipment Gait Distance (Feet): 40 Feet Assistive device: Rolling walker (2 wheels)   Gait velocity: decreased     General Gait Details: decreased B LE step length/foot clearance; flexed trunk posture; limited d/t fatigue   Stairs             Wheelchair Mobility    Modified Rankin (Stroke Patients Only)       Balance Overall balance assessment: Needs assistance Sitting-balance support: Bilateral upper extremity supported, Feet supported Sitting balance-[name]: Fair Sitting balance - Comments: steady static sitting edge of bed   Standing balance support: Bilateral upper extremity supported, Reliant on assistive device for balance, During functional activity Standing balance-[name]: Fair Standing balance comment: CGA to min assist for standing balance                            Cognition Arousal/Alertness: Awake/alert Behavior During Therapy: Flat affect Overall Cognitive  Status: Impaired/Different from baseline Area of Impairment: Following  commands, Problem solving, Attention, Safety/judgement, Orientation, Awareness                 Orientation Level: Disoriented to, Place, Time, Situation, Person (Oriented to name only) Current Attention Level: Focused   Following Commands: Follows one step commands inconsistently Safety/Judgement: Decreased awareness of safety, Decreased awareness of deficits   Problem Solving: Slow processing, Decreased initiation, Difficulty sequencing, Requires verbal cues, Requires tactile cues          Exercises      General Comments General comments (skin integrity, edema, etc.): no drainage noted from abdominal incision; dark red blood noted in foley catheter bag prior to therapy activity.  Nursing cleared pt for participation in physical therapy.  Pt agreeable to PT session.      Pertinent Vitals/Pain Pain Assessment Pain Assessment: No/denies pain Pain Intervention(s): Limited activity within patient's tolerance, Monitored during session, Repositioned Vitals (HR and O2 on room air) stable and WFL throughout treatment session.    Home Living                          Prior Function            PT Goals (current goals can now be found in the care plan section) Acute Rehab PT Goals Patient Stated Goal: to improve strength and mobility PT Goal Formulation: With patient/family Time For Goal Achievement: 02/10/22 Potential to Achieve Goals: Fair Progress towards PT goals: Progressing toward goals    Frequency    Min 2X/week      PT Plan Current plan remains appropriate    Co-evaluation              AM-PAC PT "6 Clicks" Mobility   Outcome Measure  Help needed turning from your back to your side while in a flat bed without using bedrails?: A Lot Help needed moving from lying on your back to sitting on the side of a flat bed without using bedrails?: A Lot Help needed moving to and from a bed to a chair (including a wheelchair)?: A Little Help needed  standing up from a chair using your arms (e.g., wheelchair or bedside chair)?: A Little Help needed to walk in hospital room?: A Lot Help needed climbing 3-5 steps with a railing? : Total 6 Click Score: 13    End of Session Equipment Utilized During Treatment: Gait belt (up high away from incision) Activity Tolerance: Patient limited by fatigue Patient left: in bed;with call bell/phone within reach;with bed alarm set;with family/visitor present;Other (comment) (B heels floating via pillow support) Nurse Communication: Mobility status;Precautions PT Visit Diagnosis: Unsteadiness on feet (R26.81);Muscle weakness (generalized) (M62.81);Other abnormalities of gait and mobility (R26.89)     Time: 5625-6389 PT Time Calculation (min) (ACUTE ONLY): 23 min  Charges:  $Gait Training: 8-22 mins $Therapeutic Activity: 8-22 mins                     Leitha Bleak, PT 01/30/22, 2:13 PM

## 2022-01-30 NOTE — Plan of Care (Signed)
  Problem: Clinical Measurements: Goal: Will remain free from infection Outcome: Progressing   Problem: Clinical Measurements: Goal: Diagnostic test results will improve Outcome: Progressing   Problem: Nutrition: Goal: Adequate nutrition will be maintained Outcome: Progressing   Problem: Activity: Goal: Risk for activity intolerance will decrease Outcome: Progressing

## 2022-01-30 NOTE — Progress Notes (Signed)
PROGRESS NOTE    Troy Berry.  OZH:[id number] DOB: 1945-09-05 DOA: 01/03/2022 PCP: [facility]   Brief Narrative: This 76 year old Male with PMH significant for atonic bladder status post suprapubic catheter, stage IV CKD, hypertension presents to the ED on 01/04/22 with complaint of suprapubic catheter dysfunction as well as abdominal distention.  Patient was found to have possible SBO versus ileus.  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  General surgery and urology were consulted.  Patient was started on cefepime to cover for UTI.  NG tube placed for abdominal distention.  Patient did not improve with conservative management and subsequently underwent exploratory laparoscopy and reduction of internal hernia on 01/07/22. Hospital course complicated by serous drainage through the wound, wound dehiscence and omental exposure. On 01/10/22 Patient was taken to the OR for wound closure.  He was noted to have distended bladder despite suprapubic catheter.  Bloody urine was aspirated. There was no evidence of bowel obstruction.  On 01/11/22 decision was made to start patient on TPN. NG tube has been removed, He has had BM. Diet advanced to soft.  Awaiting stability of Hb and renal function.    Assessment & Plan:   Principal Problem:   Acute renal failure superimposed on stage 4 chronic kidney disease (HCC) Active Problems:   SBO (small bowel obstruction) (HCC)   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HCC)   Anemia   Hematuria   Urinary tract infection   Metabolic acidosis   Essential hypertension   Gross hematuria   Dementia without behavioral disturbance (HCC)   Hypokalemia   Acute blood loss anemia   Protein-calorie malnutrition, severe   Wound dehiscence  Small bowel obstruction: > Resolved.   - Patient did  not improve with conservative management. He underwent exploratory laparotomy and reduction of hernia on 01/07/22. -Status post reduction of ventral hernia complicated by midline wound dehiscence.  He is now status post wound closure. -TPN has been discontinued.  -NG tube has been removed. Diet advanced to Soft. He is tolerating some. -Continue adequate pain control with Dilaudid as needed.   AKI on CKD stage IV: Baseline creatinine 3.5, creatinine peaked at 4.8 Component of obstructive uropathy and suprapubic catheter dysfunction.   Urology consulted, replaced suprapubic catheter. Nephrology following, patient not felt to be a good long term candidate for dialysis.   He continued to have good urine output.  Creatinine trending down 4.0 >3.66 Continue IV Lasix 20 mg daily. Monitor serum creatinine.  Suprapubic catheter dysfunction: History of atonic bladder requiring suprapubic catheter placed approximately 1 month ago on 12/05/2021. Presented with hematuria and poor urinary drainage. Evaluated by urology 01/01/22, it was discovered that suprapubic catheter tip was not in the bladder but adjacent to the small bowel.  Urology consulted and suprapubic catheter replaced with cystogram demonstrating appropriate placement within the bladder. Hematuria persist. Urology re-consulted, catheter was exchanged. Patient will need out patient follow up . Hematuria improving.   Acute blood loss anemia post surgery: Anemia likely multifactorial, anemia of chronic disease. Patient has had multiple blood transfusion total of 3 so far. Received IV iron 01/22/22 Monitor H&H.  Hemoglobin remains around 7.   Hematuria >  secondary to suprapubic catheter dysfunction. Continue to monitor   UTI secondary to obstructive uropathy: He completed course of IV antibiotics.  Hyponatremia> resolved.   Metabolic acidosis, in the  setting of CKD stage IV: Continue sodium bicarbonate tablets.   Essential  hypertension: Continue Coreg, amlodipine.  Dementia : At risk for delirium.   Continue Delirium precaution.    Wound dehiscence: Hospital course complicated by wound dehiscence , s/p OR 10/6  Wound is now stable and healing well.   Protein-calorie malnutrition, severe Nutrition Status: Nutrition Problem: Severe Malnutrition Etiology: chronic illness (dementia, CKD, internal hernia) Signs/Symptoms: severe fat depletion, severe muscle depletion on TPN See nurse documentation.  Estimated body mass index is 20.74 kg/m as calculated from the following:   Height as of this encounter: '5\' 9"'$  (1.753 m).   Weight as of this encounter: 63.7 kg.   DVT prophylaxis: SCDs Code Status: Full code Family Communication: Wife at bed side Disposition Plan:   Status is: Inpatient Remains inpatient appropriate because: Admitted for small bowel obstruction,  underwent exploratory laparotomy, hospital course complicated by wound dehiscence,  underwent revision.  SBO resolved.  Anticipated discharge once renal functions and hemoglobin stabilizes.   Consultants:  General surgery Urology Nephrology Palliative care  Procedures: Status post reduction of ventral hernia complicated by midline wound dehiscence he is status post wound closure.  Antimicrobials: Anti-infectives (From admission, onward)    Start     Dose/Rate Route Frequency Ordered Stop   01/10/22 1602  ceFAZolin (ANCEF) 2-4 GM/100ML-% IVPB       Note to Pharmacy: Trudie Reed S: cabinet override      01/10/22 1602 01/11/22 0414   01/07/22 1414  ceFAZolin (ANCEF) 2-4 GM/100ML-% IVPB       Note to Pharmacy: Olena Mater F: cabinet override      01/07/22 1414 01/07/22 1711   01/07/22 1345  ceFAZolin (ANCEF) IVPB 2g/100 mL premix       Note to Pharmacy: On call to OR   2 g 200 mL/hr over 30 Minutes Intravenous  Once 01/07/22 1256 01/07/22 1608   01/04/22 1530  ceFEPIme (MAXIPIME) 1 g in sodium chloride 0.9 % 100 mL IVPB        1  g 200 mL/hr over 30 Minutes Intravenous Every 24 hours 01/04/22 1445 01/12/22 2359   01/03/22 1445  piperacillin-tazobactam (ZOSYN) IVPB 3.375 g        3.375 g 100 mL/hr over 30 Minutes Intravenous  Once 01/03/22 1445 01/04/22 0813      Subjective: Patient was seen and examined at bedside.  Overnight events noted.   Patient reports he is doing much better. He is tolerating soft bland diet, states abdominal pain has improved. He is able to pass flatus, he still has hematuria which is improving.   Objective: Vitals:   01/30/22 0407 01/30/22 0500 01/30/22 0506 01/30/22 0820  BP: (!) 148/61  (!) 149/67 (!) 158/67  Pulse: 62  65 66  Resp: '18  18 20  '$ Temp: 98.7 F (37.1 C)  97.8 F (36.6 C) 97.8 F (36.6 C)  TempSrc:   Oral Oral  SpO2: 100%  100% 100%  Weight:  62.2 kg    Height:        Intake/Output Summary (Last 24 hours) at 01/30/2022 1100 Last data filed at 01/30/2022 1047 Gross per 24 hour  Intake 237 ml  Output 2650 ml  Net -2413 ml   Filed Weights   01/28/22 0500 01/29/22 0345 01/30/22 0500  Weight: 63.7 kg 62.7 kg 62.2 kg    Examination:  General exam: Appears comfortable, NAD, chronically ill looking, very deconditioned. Respiratory system: CTA bilaterally, respiratory effort normal, RR 16 Cardiovascular system:  S1 & S2 heard, regular rate and rhythm, no murmur. Gastrointestinal system: Abdomen is soft, non tender, non distended, BS+, midline surgical scar noted. Central nervous system: Alert and oriented  x 2. No focal neurological deficits. Extremities: No edema, no cyanosis, no clubbing. Skin: No rashes, lesions or ulcers Psychiatry: Judgement and insight appear normal. Mood & affect appropriate.     Data Reviewed: I have personally reviewed following labs and imaging studies  CBC: Recent Labs  Lab 01/25/22 1236 01/26/22 0521 01/27/22 0706 01/28/22 0533 01/29/22 1607  WBC 9.4 12.0* 9.7 9.7  --   HGB 7.1* 7.7* 7.1* 7.0* 7.0*  HCT 21.8* 23.6*  22.5* 21.5* 21.6*  MCV 85.5 84.9 86.2 86.0  --   PLT 218 232 223 192  --    Basic Metabolic Panel: Recent Labs  Lab 01/25/22 0555 01/26/22 0521 01/27/22 0701 01/28/22 0533 01/30/22 0434  NA 136 139 139 138 136  K 4.3 4.4 4.3 4.3 4.2  CL 108 112* 110 112* 108  CO2 21* 20* 20* 17* 21*  GLUCOSE 116* 118* 101* 78 86  BUN 105* 92* 114* 119* 100*  CREATININE 3.70* 3.75* 4.09* 4.00* 3.66*  CALCIUM 10.0 10.3 10.5* 9.8 9.2  MG  --   --  2.0  --  2.2  PHOS  --   --  2.8  --  4.1   GFR: Estimated Creatinine Clearance: 14.9 mL/min (A) (by C-G formula based on SCr of 3.66 mg/dL (H)). Liver Function Tests: Recent Labs  Lab 01/27/22 0701 01/30/22 0434  AST 28 21  ALT 12 16  ALKPHOS 66 66  BILITOT 0.2* 0.6  PROT 5.8* 5.9*  ALBUMIN 2.4* 2.5*   No results for input(s): "LIPASE", "AMYLASE" in the last 168 hours. No results for input(s): "AMMONIA" in the last 168 hours. Coagulation Profile: No results for input(s): "INR", "PROTIME" in the last 168 hours. Cardiac Enzymes: No results for input(s): "CKTOTAL", "CKMB", "CKMBINDEX", "TROPONINI" in the last 168 hours. BNP (last 3 results) No results for input(s): "PROBNP" in the last 8760 hours. HbA1C: No results for input(s): "HGBA1C" in the last 72 hours. CBG: No results for input(s): "GLUCAP" in the last 168 hours. Lipid Profile: Recent Labs    01/30/22 0434  TRIG 57   Thyroid Function Tests: No results for input(s): "TSH", "T4TOTAL", "FREET4", "T3FREE", "THYROIDAB" in the last 72 hours. Anemia Panel: No results for input(s): "VITAMINB12", "FOLATE", "FERRITIN", "TIBC", "IRON", "RETICCTPCT" in the last 72 hours. Sepsis Labs: No results for input(s): "PROCALCITON", "LATICACIDVEN" in the last 168 hours.  No results found for this or any previous visit (from the past 240 hour(s)).   Radiology Studies: No results found.  Scheduled Meds:  Chlorhexidine Gluconate Cloth  6 each Topical Daily   feeding supplement  237 mL Oral TID  BM   furosemide  20 mg Intravenous Daily   multivitamin with minerals  1 tablet Oral Daily   polyethylene glycol  17 g Oral BID   sodium bicarbonate  650 mg Oral BID   sodium chloride flush  10-40 mL Intracatheter Q12H   sodium chloride flush  3 mL Intravenous Q12H   Continuous Infusions:  sodium chloride Stopped (01/22/22 2041)     LOS: 26 days    Time spent: 35 mins    Marchele Decock, MD [facility]   If 7PM-7AM, please contact night-coverage

## 2022-01-31 DIAGNOSIS — N179 Acute kidney failure, unspecified: Secondary | ICD-10-CM | POA: Diagnosis not present

## 2022-01-31 DIAGNOSIS — N184 Chronic kidney disease, stage 4 (severe): Secondary | ICD-10-CM | POA: Diagnosis not present

## 2022-01-31 NOTE — Discharge Summary (Signed)
Physician Discharge Summary  Troy Berry. XEN:[id number] DOB: 12-Feb-1946 DOA: 01/03/2022  PCP: [facility] [place] date: 01/03/2022  Discharge date: 01/31/2022  Admitted From: Home.  [facility]  Recommendations for Outpatient Follow-up:  Follow up with PCP in 1-2 weeks. Please obtain BMP/CBC in one week. Advised to follow-up with urology in 1 month for suprapubic catheter exchange. Advised to continue current medications.  Home Health: None Equipment/Devices: Suprapubic catheter  Discharge Condition: Stable CODE STATUS:Full code Diet recommendation: Heart Healthy   Brief [facility] Course: This 76 year old Male with PMH significant for atonic bladder status post suprapubic catheter, stage IV CKD, hypertension presents to the ED on 01/04/22 with complaints of suprapubic catheter dysfunction as well as abdominal distention.  Patient was found to have possible SBO versus ileus.  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  General surgery and urology were consulted.  Patient was started on cefepime to cover for UTI.  NG tube placed for abdominal distention.  Patient did not improve with conservative management and subsequently underwent exploratory laparoscopy and reduction of internal hernia on 01/07/22. Hospital course complicated by serous drainage through the wound, wound dehiscence and omental exposure. On 01/10/22 Patient was taken to the OR for wound closure.  He was noted to have distended bladder despite suprapubic catheter.  Bloody urine was aspirated. There was no evidence of bowel obstruction.  On 01/11/22 decision was made to start patient on TPN.  Urology recommended outpatient follow-up in 1 month. NG tube has been removed, He has had BM. Diet advanced to soft.   General surgery signed off.  Nephrology was consulted continued on supportive care.  Renal functions back to baseline.  Hemoglobin remains stable.  Patient continued on iron supplements.  Patient has been tolerating regular diet.  PT recommended skilled nursing facility for rehab.  Patient is being discharged today to [place] skilled nursing home.  Discharge Diagnoses:  Principal Problem:   Acute renal failure superimposed on stage 4 chronic kidney disease (HCC) Active Problems:   SBO (small bowel obstruction) (HCC)   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HCC)   Anemia   Hematuria   Urinary tract infection   Metabolic acidosis   Essential hypertension   Gross hematuria   Dementia without behavioral disturbance (HCC)   Hypokalemia   Acute blood loss anemia   Protein-calorie malnutrition, severe   Wound dehiscence  Small bowel obstruction: > Resolved.   - Patient did not improve with conservative management. He underwent exploratory laparotomy and reduction of hernia on 01/07/22. -Status post reduction of ventral hernia complicated by midline wound dehiscence.  He is now status post wound closure. -TPN has been discontinued.  -NG tube has been removed. Diet advanced to Soft. He is tolerating some. - Adequate pain control.   AKI on CKD stage IV: Baseline creatinine 3.5, creatinine peaked at 4.8 Component of obstructive uropathy and suprapubic catheter dysfunction.   Urology consulted, replaced suprapubic catheter. Nephrology following, patient not felt to be a good long term candidate for dialysis.   He continued to have good urine output.  Creatinine trending down 4.0 >3.66 Continue IV Lasix 20 mg daily. Monitor serum creatinine.   Suprapubic catheter dysfunction: History of atonic bladder requiring suprapubic catheter placed approximately 1 month ago on 12/05/2021. Presented with hematuria and poor urinary drainage. Evaluated by urology 01/01/22,  it was discovered that suprapubic  catheter tip was not in the bladder but adjacent to the small bowel.  Urology consulted and suprapubic catheter replaced with cystogram demonstrating appropriate placement within the bladder. Hematuria persist. Urology re-consulted, catheter was exchanged. Patient will need out patient follow up . Hematuria improving.   Acute blood loss anemia post surgery: Anemia likely multifactorial, anemia of chronic disease. Patient has had multiple blood transfusion total of 3 so far. Received IV iron 01/22/22 Monitor H&H.  Hemoglobin remains around 7.   Hematuria >  secondary to suprapubic catheter dysfunction. Continue to monitor, outpatient urology follow-up.   UTI secondary to obstructive uropathy: He completed course of IV antibiotics.   Hyponatremia> resolved.   Metabolic acidosis, in the setting of CKD stage IV: Resolved.   Essential hypertension: Continue Coreg, amlodipine.   Dementia : At risk for delirium.   Continue Delirium precaution.    Wound dehiscence: Hospital course complicated by wound dehiscence , s/p OR 10/6  Wound is now stable and healing well.   Protein-calorie malnutrition, severe Nutrition Status: Nutrition Problem: Severe Malnutrition Etiology: chronic illness (dementia, CKD, internal hernia) Signs/Symptoms: severe fat depletion, severe muscle depletion on TPN See nurse documentation.  Estimated body mass index is 20.74 kg/m as calculated from the following:   Height as of this encounter: _0  (1.753 m).   Weight as of this encounter: 63.7 kg.  Discharge Instructions  Discharge Instructions     Call MD for:  difficulty breathing, headache or visual disturbances   Complete by: As directed    Call MD for:  persistant dizziness or light-headedness   Complete by: As directed    Call MD for:  persistant nausea and vomiting   Complete by: As directed    Diet - low sodium heart healthy   Complete by: As directed    Diet Carb Modified   Complete by: As  directed    Discharge instructions   Complete by: As directed    Advised to follow-up with primary care physician in 1 week. Advised to follow-up with urology in 1 month for suprapubic catheter exchange. Advised to continue current medications.   Discharge wound care:   Complete by: As directed    Follow-up wound care in nursing home.   Increase activity slowly   Complete by: As directed       Allergies as of 01/31/2022       Reactions   Ace Inhibitors    Other reaction(s): Other (See Comments) lip swelling        Medication List     STOP taking these medications    aspirin EC 81 MG tablet   Colchicine 0.6 MG Caps       TAKE these medications    acetaminophen 325 MG tablet Commonly known as: TYLENOL Take 2 tablets (650 mg total) by mouth every 6 (six) hours as needed for mild pain (or Fever >/= 101).   allopurinol 300 MG tablet Commonly known as: ZYLOPRIM Take 300 mg by mouth daily.   amLODipine 10 MG tablet Commonly known as: NORVASC Take 10 mg by mouth daily.   calcium citrate-vitamin D 315-200 MG-UNIT tablet Commonly known as: CITRACAL+D Take by mouth.   carvedilol 25 MG tablet Commonly known as: COREG Take 25 mg by mouth 2 (two) times daily with a meal.   cholecalciferol 25 MCG (1000 UNIT) tablet Commonly known as: VITAMIN D3 Take 1,000 Units by mouth daily.   CRANBERRY-CALCIUM PO Take 1 capsule by mouth daily.  FeroSul 325 (65 FE) MG tablet Generic drug: ferrous sulfate Take 325 mg by mouth daily.   furosemide 40 MG tablet Commonly known as: LASIX Take 40 mg by mouth daily.   lovastatin 40 MG tablet Commonly known as: MEVACOR Take 1 tablet by mouth every evening.   polyethylene glycol 17 g packet Commonly known as: MiraLax Take 17 g by mouth daily.               Discharge Care Instructions  (From admission, onward)           Start     Ordered   01/31/22 0000  Discharge wound care:       Comments: Follow-up wound  care in nursing home.   01/31/22 1010            Contact information for follow-up providers     [facility] Follow up in 1 week(s).   Specialty: General Practice Contact information: [place] [address] [phone]         Abbie Sons, MD Follow up in 1 month(s).   Specialty: Urology Why: Suprapubic catheter exchange Contact information: [place] [employer] [place] [address] [phone]              Contact information for after-discharge care     [facility] .   Service: Skilled Nursing Contact information: [place] [phone]                    Allergies  Allergen Reactions   Ace Inhibitors     Other reaction(s): Other (See Comments) lip swelling    Consultations: Urology General surgery    Procedures/Studies: DG Abd 1 View  Result Date: 01/22/2022 CLINICAL DATA:  Abdominal distension. EXAM: ABDOMEN - 1 VIEW COMPARISON:  January 21, 2022. FINDINGS: The bowel gas pattern is normal. Nasogastric tube tip is seen in stomach. Midline surgical staples are noted. Surgical drain or suprapubic urinary catheter is noted in the pelvis. IMPRESSION: No abnormal bowel dilatation. Electronically Signed   By: Marijo Conception M.D.   On: 01/22/2022 08:27   DG Abd Portable 1V  Result Date: 01/21/2022 CLINICAL DATA:  Nasogastric tube placement. EXAM: PORTABLE ABDOMEN - 1 VIEW COMPARISON:  CT 01/15/2022. Radiographs 01/20/2022, 01/14/2022 and 01/06/2022. FINDINGS: 1429 hours. Single semi erect view of the abdomen centered at the gastroesophageal junction demonstrates an enteric tube projecting over the left upper quadrant of the abdomen. The side-hole is below the GE junction. Diffuse bowel distension again noted, mildly increased from yesterday. There is atelectasis at the right lung base.  Abdominal skin staples are in place. IMPRESSION: 1. Enteric tube projects over the left upper quadrant of the abdomen, likely in the mid stomach. 2. Mildly increased bowel distension, still likely reflecting a postoperative ileus. Electronically Signed   By: Richardean Sale M.D.   On: 01/21/2022 15:12   DG Abd 2 Views  Result Date: 01/20/2022 CLINICAL DATA:  [id number] Ileus following gastrointestinal surgery ([place]) [id number] EXAM: ABDOMEN - 2 VIEW COMPARISON:  Radiograph 01/15/2022 FINDINGS: Nasogastric tube tip and side port overlie the stomach. There is persistent diffuse gaseous distension of bowel in the abdomen, not significantly changed from prior. Suprapubic catheter overlies the pelvis. Midline skin staples. IMPRESSION: Persistent diffuse gaseous distention of bowel, not significantly changed from prior, compatible with ileus. Electronically Signed   By: Maurine Simmering  M.D.   On: 01/20/2022 08:44   IR PICC PLACEMENT RIGHT >5 YRS INC IMG GUIDE  Result Date: 01/15/2022 INDICATION: Patient requiring TPN. Interventional radiology asked to place a PICC. EXAM: ULTRASOUND AND FLUOROSCOPIC GUIDED PICC LINE INSERTION MEDICATIONS: 1% lidocaine CONTRAST:  None FLUOROSCOPY TIME:  Radiation exposure index as provided by the fluoroscopic device 61.4 mGy kerma COMPLICATIONS: None immediate. TECHNIQUE: The procedure, risks, benefits, and alternatives were explained to the patient and informed written consent was obtained. The right upper extremity was prepped with chlorhexidine in a sterile fashion, and a sterile drape was applied covering the operative field. Maximum barrier sterile technique with sterile gowns and gloves were used for the procedure. A timeout was performed prior to the initiation of the procedure. Local anesthesia was provided with 1% lidocaine. After the overlying soft tissues were anesthetized with 1% lidocaine, a micropuncture kit was utilized to access the right brachial vein. Real-time ultrasound  guidance was utilized for vascular access including the acquisition of a permanent ultrasound image documenting patency of the accessed vessel. A guidewire was advanced to the level of the superior caval-atrial junction for measurement purposes and the PICC line was cut to length. A peel-away sheath was placed and a 36 cm, 5 [place], dual lumen was inserted to level of the superior caval-atrial junction. A post procedure spot fluoroscopic was obtained. The catheter easily aspirated and flushed and was secured in place. A dressing was placed. The patient tolerated the procedure well without immediate post procedural complication. FINDINGS: After catheter placement, the tip lies within the superior cavoatrial junction. The catheter aspirates and flushes normally and is ready for immediate use. IMPRESSION: Successful ultrasound and fluoroscopic guided placement of a right brachial vein approach, 36 cm, 5 French, dual lumen PICC with tip at the superior caval-atrial junction. The PICC line is ready for immediate use. Read by: Soyla Dryer, NP Electronically Signed   By: Aletta Edouard M.D.   On: 01/15/2022 15:52   DG Abd 1 View  Result Date: 01/15/2022 CLINICAL DATA:  Nasogastric tube present EXAM: ABDOMEN - 1 VIEW COMPARISON:  None Available. FINDINGS: Gastric tube courses below the diaphragm with the tip in the expected region stomach. Bowel dilation and bibasilar opacities, better evaluated on same day CT. IMPRESSION: Gastric tube courses below the diaphragm with the tip in the expected region stomach. Electronically Signed   By: Margaretha Sheffield M.D.   On: 01/15/2022 09:39   CT ABDOMEN PELVIS WO CONTRAST  Result Date: 01/15/2022 CLINICAL DATA:  Acute nonlocalized abdominal pain EXAM: CT ABDOMEN AND PELVIS WITHOUT CONTRAST TECHNIQUE: Multidetector CT imaging of the abdomen and pelvis was performed following the standard protocol without IV contrast. RADIATION DOSE REDUCTION: This exam was performed  according to the departmental dose-optimization program which includes automated exposure control, adjustment of the mA and/or kV according to patient size and/or use of iterative reconstruction technique. COMPARISON:  01/13/2022 FINDINGS: Lower chest: Small bilateral pleural effusions with basilar atelectasis. Motion artifact limits evaluation. Hepatobiliary: Cholelithiasis with multiple stones in the gallbladder. Increased density in the bowel suggesting sludge. No inflammatory stranding. No bile duct dilatation. No focal liver lesions. Vascular calcifications. Pancreas: Unremarkable. No pancreatic ductal dilatation or surrounding inflammatory changes. Spleen: Normal in size without focal abnormality. Adrenals/Urinary Tract: No adrenal gland nodules. Increased density in the renal hila consistent with prominent vascular calcifications. Left renal cysts, largest measuring 3.9 cm diameter. Probable hemorrhagic cysts. No change since prior study. No imaging follow-up indicated. No hydronephrosis or hydroureter. Bladder is decompressed  with a suprapubic catheter in place. Stomach/Bowel: Moderately dilated gas and fluid-filled stomach and small bowel. Contrast material demonstrated throughout the colon suggest no evidence of complete obstruction. Changes may represent partial obstruction, ileus or possibly enteritis. No wall thickening or pneumatosis identified. Vascular/Lymphatic: Diffuse vascular calcifications. No aneurysm. No significant lymphadenopathy. Reproductive: Prostate gland is enlarged. Other: Moderate diffuse abdominal and pelvic ascites. No free air. Small right inguinal hernia containing fat and fluid. Musculoskeletal: Degenerative changes in the spine and hips. No acute bony abnormalities. Old left rib fractures. IMPRESSION: 1. Persistent small bowel dilatation with fluid-filled loops. Contrast material is seen in the colon suggesting no evidence of complete obstruction. Changes may represent partial  obstruction, ileus, or enteritis. 2. Small bilateral pleural effusions with basilar atelectasis. 3. Cholelithiasis without evidence of acute cholecystitis. 4. Moderate abdominal and pelvic ascites. 5. Suprapubic catheter decompresses the bladder. 6. Enlarged prostate gland. 7. Small right inguinal hernia containing fat and fluid. Electronically Signed   By: Lucienne Capers M.D.   On: 01/15/2022 01:01   DG Abd Portable 1V  Addendum Date: 01/15/2022   ADDENDUM REPORT: 01/15/2022 00:01 ADDENDUM: These results were called by telephone at the time of interpretation on 01/15/2022 at 12:00 am to provider NP KATY FOUST , who verbally acknowledged these results. Electronically Signed   By: Iven Finn M.D.   On: 01/15/2022 00:01   Result Date: 01/15/2022 CLINICAL DATA:  [id number] Abdominal pain [id number] EXAM: PORTABLE ABDOMEN - 1 VIEW COMPARISON:  CT abdomen pelvis 01/13/2022, x-ray abdomen 01/10/2022 FINDINGS: Interval worsening of small and large bowel gaseous dilatation. Unable to exclude pneumoperitoneum. No radio-opaque calculi or other significant radiographic abnormality are seen. Suprapubic catheter overlies the pelvis. Vascular calcifications. IMPRESSION: Interval worsening of small and large bowel gaseous dilatation. Unable to exclude pneumoperitoneum. Recommend CT with intravenous contrast for further evaluation. These results will be called to the ordering clinician or representative by the Radiologist Assistant, and communication documented in the PACS or [employer]. Electronically Signed: By: Iven Finn M.D. On: 01/14/2022 23:47   [place] EKG SITE RITE  Result Date: 01/14/2022 If Site Rite image not attached, placement could not be confirmed due to current cardiac rhythm.  [place] EKG SITE RITE  Result Date: 01/14/2022 If Site Rite image not attached, placement could not be confirmed due to current cardiac rhythm.  CT ABDOMEN PELVIS WO CONTRAST  Result Date: 01/13/2022 CLINICAL DATA:   Abdominal pain, postop bowel obstruction suspected. Small-bowel obstruction, 5 day postop status post reduction of internal hernia, complicated by midline wound dehiscence status post wound closure postop day 2. EXAM: CT ABDOMEN AND PELVIS WITHOUT CONTRAST TECHNIQUE: Multidetector CT imaging of the abdomen and pelvis was performed following the standard protocol without IV contrast. RADIATION DOSE REDUCTION: This exam was performed according to the departmental dose-optimization program which includes automated exposure control, adjustment of the mA and/or kV according to patient size and/or use of iterative reconstruction technique. COMPARISON:  CT abdomen and pelvis dated 01/03/2022 and CT cystogram dated 01/03/2022. FINDINGS: Lower chest: Small bilateral pleural effusions. Bibasilar atelectasis. Dense coronary artery calcifications at the heart base. Hepatobiliary: No focal liver abnormality is seen. Multiple calcified gallstones, as previously described. Gallbladder moderately distended. No bile duct dilatation is seen. Pancreas: Unremarkable. No pancreatic ductal dilatation or surrounding inflammatory changes. Spleen: Normal in size without focal abnormality. Adrenals/Urinary Tract: Suprapubic catheter appears appropriately positioned within the bladder. 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 As  also previously described, there is hyperdense material within the upper pole moiety of the LEFT renal pelvis, not significantly changed compared to the recent CTs of 01/03/2022 but new compared to an earlier CT of 04/27/2019. Bilateral nonobstructing renal stones. Bilateral renal cysts, as previously described. No follow-up imaging is recommended for these benign findings. No acute findings of either kidney. No ureteral or bladder stone is seen. Stomach/Bowel: Mildly distended fluid-filled small bowel loops within the LEFT  abdomen and upper central abdomen, with some associated air-fluid levels which are nonspecific in configuration. Large bowel is of normal caliber. Diverticulosis is seen throughout the colon. Moderate-sized stool ball in the rectal vault. Appendix is normal. Stomach is moderately distended with fluid and associated air-fluid levels. Vascular/Lymphatic: Extensive aortic atherosclerosis. No abdominal aortic aneurysm. No enlarged lymph nodes are seen. Reproductive: Prostate is enlarged causing mass effect on the bladder base, not significantly changed compared to the older study of 04/27/2019. Other: Small amount of free fluid in the abdomen and pelvis. No evidence of abscess collection. No evidence of free intraperitoneal air. Musculoskeletal: Degenerative spondylosis of the thoracolumbar spine, moderate to severe in degree. No acute-appearing osseous abnormality. Fluid distends the RIGHT inguinal canal, increased compared to the earlier studies of 01/03/2022. Small focus of air within the RIGHT inguinal canal, of uncertain significance, possibly related to a recent procedural intervention IMPRESSION: 1. Mildly distended fluid-filled small bowel loops within the LEFT abdomen and upper central abdomen, with some associated air-fluid levels which are nonspecific in configuration. Differential considerations include adynamic ileus versus a partial small bowel obstruction, favor ileus. 2. Suprapubic catheter appears appropriately positioned within the bladder. 3. Hyperdense material within the upper pole moiety of the LEFT renal pelvis, not significantly changed compared to the recent CTS of 01/03/2022. As previously suggested, this could represent sequela of a previous/recent hemorrhage or excreted contrast material from a prior exam. Neoplastic process is considered less likely. Recommend follow-up renal protocol CT in 3 months as also recommended on the initial CT report of 01/03/2022. 4. Small bilateral pleural  effusions. 5. Small amount of free fluid in the abdomen and pelvis. No evidence of abscess collection. No evidence of free intraperitoneal air. 6. Fluid distends the RIGHT inguinal canal, increased compared to the earlier studies of 01/03/2022, with associated punctate focus of air and with wall thickening/inflammation. This is of uncertain significance, possibly a concomitant vasitis or sequela of interval procedural intervention including repositioning of the suprapubic catheter. Any pain or superficial signs of infection at the RIGHT inguinal region? If so, consider directed ultrasound for further characterization. 7. Colonic diverticulosis without evidence of acute diverticulitis. 8. Cholelithiasis without evidence of acute cholecystitis. Electronically Signed   By: Franki Cabot M.D.   On: 01/13/2022 11:34   DG Abd 1 View  Result Date: 01/10/2022 CLINICAL DATA:  Follow-up for bowel obstruction. Abdominal pain and distension. EXAM: ABDOMEN - 1 VIEW COMPARISON:  01/06/2022 and older exams.  CT, 01/03/2022. FINDINGS: Multiple loops of dilated small bowel, less dilated than on the most recent prior study. Midline skin staples, new since the prior exam. IMPRESSION: 1. Findings consistent with interval surgery since the previous radiographs. Mild small bowel dilation significantly improved from the prior exam, likely due to mild postoperative ileus. Electronically Signed   By: Lajean Manes M.D.   On: 01/10/2022 11:48   DG Abd 2 Views  Result Date: 01/07/2022 CLINICAL DATA:  Ileus versus small-bowel obstruction EXAM: ABDOMEN - 2 VIEW COMPARISON:  Portable exam 0927 hours compared to 01/06/2022  FINDINGS: Nasogastric tube coiled in proximal stomach. Persistent small bowel dilatation. Relatively decreased gas and stool in colon though stool is present in rectum. No bowel wall thickening or free air. Diffuse osseous demineralization. Bibasilar atelectasis. IMPRESSION: Persistent small bowel dilatation with  relative paucity of gas/stool in colon, favor small-bowel obstruction, little changed. Electronically Signed   By: Lavonia Dana M.D.   On: 01/07/2022 09:46   DG Abd Portable 1V  Result Date: 01/06/2022 CLINICAL DATA:  8 hour small bowel follow-up film EXAM: PORTABLE ABDOMEN - 1 VIEW COMPARISON:  Film from earlier in the same day. FINDINGS: Suprapubic catheter is noted. Persistent small bowel dilatation is noted consistent with a least partial small bowel obstruction. Administered contrast lies entirely within the stomach. No passage into the small bowel is identified. IMPRESSION: Administered contrast lies within the stomach. Persistent small bowel obstructive changes are noted. Electronically Signed   By: Inez Catalina M.D.   On: 01/06/2022 19:15   DG Abd Portable 1V-Small Bowel Obstruction Protocol-initial, 8 hr delay  Result Date: 01/06/2022 CLINICAL DATA:  Nasogastric placement. EXAM: PORTABLE ABDOMEN - 1 VIEW COMPARISON:  Yesterday. FINDINGS: Nasogastric tube enters the stomach with its tip in the fundus. Moderate gaseous distension of the bowel remains evident. IMPRESSION: Nasogastric tube tip in the fundus of the stomach. Electronically Signed   By: Nelson Chimes M.D.   On: 01/06/2022 08:05   DG Abd Portable 1V  Result Date: 01/05/2022 CLINICAL DATA:  NG tube placement EXAM: PORTABLE ABDOMEN - 1 VIEW COMPARISON:  None Available. FINDINGS: NG tube extends into the gastric body. Side port below the GE junction. Multiple gas-filled loops of small bowel. Calcified hilar lymph nodes. IMPRESSION: NG tube in stomach. Electronically Signed   By: Suzy Bouchard M.D.   On: 01/05/2022 13:34   DG Abd Portable 1V  Result Date: 01/05/2022 CLINICAL DATA:  [id number] enteric tube placement EXAM: PORTABLE ABDOMEN - 1 VIEW COMPARISON:  January 05, 2022 FINDINGS: Enteric tube tip and side port project over the expected region of the stomach. There is diffuse gaseous dilation of loops of bowel in the upper abdomen.  Incomplete assessment of the majority of the abdomen and pelvis. Degenerative changes of the lumbar spine. IMPRESSION: Enteric tube tip and side port project over the stomach. Persistent diffuse marked gaseous dilation. Electronically Signed   By: Valentino Saxon M.D.   On: 01/05/2022 12:02   DG Abd Portable 1V  Result Date: 01/05/2022 CLINICAL DATA:  NG tube placement. EXAM: PORTABLE ABDOMEN - 1 VIEW COMPARISON:  None Available. FINDINGS: Enteric tube tip projects at the level of the gastric body. Moderate gaseous distention of the stomach and several gas-filled dilated loops of small bowel again noted. No definite free intraperitoneal air on semi-erect views. IMPRESSION: 1. Enteric tube tip projects at the level of the gastric body. 2. Persistent moderate gaseous distention of the stomach and several gas-filled dilated small bowel loops, consistent with persistent ileus versus obstruction. Electronically Signed   By: Ileana Roup M.D.   On: 01/05/2022 11:17   DG Abd 2 Views  Result Date: 01/05/2022 CLINICAL DATA:  Follow-up ileus. EXAM: ABDOMEN - 2 VIEW COMPARISON:  01/04/2022 and CT, 01/03/2022. FINDINGS: Moderate gaseous distention of the stomach. Mild dilation of gas-filled small bowel loops, most evident in the left mid abdomen, with no definite air-fluid levels on the erect view. No free air. Stable suprapubic catheter. IMPRESSION: 1. Moderate distention of the stomach and mild dilation of small bowel, small-bowel dilation decreased when compared  to the previous day's exam suggesting an improving adynamic ileus. Electronically Signed   By: Lajean Manes M.D.   On: 01/05/2022 09:26   DG Abd 2 Views  Result Date: 01/04/2022 CLINICAL DATA:  Abdominal distention EXAM: ABDOMEN - 2 VIEW COMPARISON:  CT abdomen pelvis, 01/03/2022 FINDINGS: Diffusely gas-filled, distended small bowel and colon throughout the abdomen and pelvis, with gas present to the rectum. Largest loops of small bowel measure up to  4.5 cm in caliber. Scattered stool throughout the colon and rectum. No obvious free air on supine radiographs. Suprapubic catheter. IMPRESSION: Diffusely gas-filled, distended small bowel and colon throughout the abdomen and pelvis, with gas present to the rectum. Findings are most consistent with ileus. Electronically Signed   By: Delanna Ahmadi M.D.   On: 01/04/2022 13:16   CT CYSTOGRAM ABD/PELVIS  Result Date: 01/03/2022 CLINICAL DATA:  Pelvic pain malpositioned Foley EXAM: CT CYSTOGRAM (CT ABDOMEN AND PELVIS WITH CONTRAST) TECHNIQUE: Multi-detector CT imaging through the abdomen and pelvis was performed after dilute contrast had been introduced into the bladder for the purposes of performing CT cystography. RADIATION DOSE REDUCTION: This exam was performed according to the departmental dose-optimization program which includes automated exposure control, adjustment of the mA and/or kV according to patient size and/or use of iterative reconstruction technique. CONTRAST:  250 mL Cystografin through suprapubic catheter COMPARISON:  CT KUB 01/03/2022, fluoroscopic images 12/30/2021, CT 05/11/2019 FINDINGS: Lower chest: Lung bases demonstrate small right-sided pleural effusion. Cardiomegaly with extensive coronary vascular calcification. Mild scarring or atelectasis at the bases. Hepatobiliary: Calcified gallstones. No focal hepatic abnormality. No biliary dilatation Pancreas: Unremarkable. No pancreatic ductal dilatation or surrounding inflammatory changes. Spleen: Normal in size without focal abnormality. Adrenals/Urinary Tract: Adrenal glands are within normal limits. Kidneys show no hydronephrosis. Left renal cysts, no follow-up imaging is recommended. Duplex left renal collecting system as before. Nonobstructing bilateral kidney stones. Hyperdense material in the left renal collecting system. Interim repositioning of suprapubic bladder catheter with balloon now visualized in the bladder. Focal contained  contrast collection at the left posterior aspect of the bladder. Delayed images following catheter drainage demonstrate draining of most of the contrast with focal collection in the left posterior pelvis and small fistulous tract, series 7, image 32. No free contrast around pelvic bowel loops. No intraluminal contrast within adjacent small bowel. Stomach/Bowel: The stomach is moderately distended with fluid. There are multiple fluid-filled loops of small bowel proximally. Slight swirling appearance of the mesentery in vessels centrally with tapered appearance of the bowel, series 2, image 49, coronal series 5 image 35, suggestive of transition point. No acute bowel wall thickening. No intramural air. Negative appendix. Vascular/Lymphatic: Advanced aortic atherosclerosis. No aneurysm. Mildly prominent left Peri aortic lymph node, series 2, image 33. Reproductive: Enlarged prostate. Other: No free air. Trace free fluid. Fluid and fat containing right inguinal hernia Musculoskeletal: No acute osseous abnormality. Multilevel degenerative changes. IMPRESSION: 1. Interval repositioning of suprapubic catheter with balloon now positioned within the bladder. Focal contained contrast collection within the left pelvis, posterior to the bladder, appears to conform to previous location of the catheter. 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 There is no intraluminal contrast within adjacent small bowel to suggest fistula or bowel perforation. There is no free extravasation of contrast into the upper abdomen. 2. Multiple loops of dilated fluid-filled small bowel with suspected transition point  within the central mesentery, suspect for bowel obstruction, potentially due to hernia or adhesive disease. 3. Small amount of hyperdense material in the  upper pole collecting system of left kidney, see prior CT KUB report for additional recommendations 4. Marked prostatomegaly 5. Gallstones Electronically Signed   By: Donavan Foil M.D.   On: 01/03/2022 17:55   CT Renal Stone Study  Result Date: 01/03/2022 CLINICAL DATA:  Flank pain EXAM: CT ABDOMEN AND PELVIS WITHOUT CONTRAST TECHNIQUE: Multidetector CT imaging of the abdomen and pelvis was performed following the standard protocol without IV contrast. RADIATION DOSE REDUCTION: This exam was performed according to the departmental dose-optimization program which includes automated exposure control, adjustment of the mA and/or kV according to patient size and/or use of iterative reconstruction technique. COMPARISON:  CT renal stone dated May 11, 2019 FINDINGS: Lower chest: Trace left pleural effusion. Bibasilar atelectasis. Cardiomegaly. Coronary artery calcifications. Calcified mediastinal lymph nodes. Hepatobiliary: Focal liver abnormality. Gallstones with no evidence of gallbladder wall thickening. No biliary ductal dilation. Pancreas: Unremarkable. No pancreatic ductal dilatation or surrounding inflammatory changes. Spleen: Normal in size without focal abnormality. Adrenals/Urinary Tract: Bilateral adrenal glands are unremarkable. 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 Hyperdense material seen in the upper pole moiety. Bilateral simple appearing renal cysts, no further follow-up imaging is recommended for these lesions. Nonobstructing bilateral stones. Suprapubic catheter with balloon positioned outside of the bladder posterior pelvis. Tip of the Foley catheter is adjacent to a small bowel loop. Stomach/Bowel: Gastric distention multiple dilated loops of small bowel no definite focal transition point. Appendix appears normal. Diverticulosis. Mild wall thickening of the rectosigmoid colon. Vascular/Lymphatic: Severe aortic atherosclerosis. Mildly enlarged left periaortic/perirenal lymph  nodes, reference node measuring 1.1 cm in short axis on series 2, image 36, likely reactive. Reproductive: Prostatomegaly. Other: Small fluid containing right inguinal hernia. Trace abdominopelvic ascites. Musculoskeletal: Multilevel degenerative disc disease. No aggressive appearing osseous lesions. IMPRESSION: 1. Suprapubic catheter with balloon positioned outside of the bladder in the posterior pelvis. Tip of the Foley catheter is adjacent to a small bowel loop, can not exclude small bowel perforation. CT cystogram or fluoroscopy could assist with further evaluation. 2. Hyperdense material seen within the left renal collecting system superior pole moiety, likely due to hemorrhage or excreted contrast material from prior exam is an additional consideration, neoplasm is less likely. Recommend follow-up renal protocol CT in 3 months to ensure resolution. 3. Multiple dilated loops of small bowel with no definite focal transition point, findings may be due to ileus versus early/partial small bowel obstruction. 4. Mild wall thickening of the rectosigmoid colon, findings can be seen in the setting of proctocolitis. 5. Trace abdominopelvic ascites. 6. Mildly enlarged left periaortic lymph nodes, likely reactive. Recommend attention on follow-up as recommended above. 7. Severe aortic Atherosclerosis (ICD10-I70.0). Critical Value/emergent results were called by telephone at the time of interpretation on 01/03/2022 at 2:36 pm to provider [facility] , who verbally acknowledged these results. Electronically Signed   By: Yetta Glassman M.D.   On: 01/03/2022 14:42   IR Radiologist Eval & Mgmt  Result Date: 01/01/2022 CLINICAL DATA:  The advanced practice provider performed assessment and established a plan, which is recorded with the electronic medical record. Electronically Signed   By: Corrie Mckusick D.O.   On: 01/01/2022 15:35    S/p exploratory laparotomy.  Closure of wound.   Subjective: Patient was seen  and examined at bedside.  Overnight events noted.   Patient reports feeling much improved. He has been  tolerating soft diet.   Denies any nausea and vomiting, able to pass flatus.  Patient is being discharged to SNF.  Discharge Exam: Vitals:   01/31/22 0425 01/31/22 0738  BP: (!) 181/62 (!) 164/75  Pulse: 66 64  Resp: 17 18  Temp: 98.4 F (36.9 C) 97.8 F (36.6 C)  SpO2: 97% 96%   Vitals:   01/30/22 1629 01/30/22 2018 01/31/22 0425 01/31/22 0738  BP: (!) 146/63 135/62 (!) 181/62 (!) 164/75  Pulse: 68 71 66 64  Resp: _0 Temp: 98.1 F (36.7 C) 98.6 F (37 C) 98.4 F (36.9 C) 97.8 F (36.6 C)  TempSrc: Oral Oral Oral Oral  SpO2: 100% 100% 97% 96%  Weight:      Height:        General: Pt is alert, awake, not in acute distress Cardiovascular: RRR, S1/S2 +, no rubs, no gallops Respiratory: CTA bilaterally, no wheezing, no rhonchi Abdominal: Soft, non tender, non distended, BS+, surgical scar noted. Extremities: no edema, no cyanosis    The results of significant diagnostics from this hospitalization (including imaging, microbiology, ancillary and laboratory) are listed below for reference.     Microbiology: No results found for this or any previous visit (from the past 240 hour(s)).   Labs: BNP (last 3 results) Recent Labs    01/15/22 2056  BNP 656.8*   Basic Metabolic Panel: Recent Labs  Lab 01/25/22 0555 01/26/22 0521 01/27/22 0701 01/28/22 0533 01/30/22 0434  NA 136 139 139 138 136  K 4.3 4.4 4.3 4.3 4.2  CL 108 112* 110 112* 108  CO2 21* 20* 20* 17* 21*  GLUCOSE 116* 118* 101* 78 86  BUN 105* 92* 114* 119* 100*  CREATININE 3.70* 3.75* 4.09* 4.00* 3.66*  CALCIUM 10.0 10.3 10.5* 9.8 9.2  MG  --   --  2.0  --  2.2  PHOS  --   --  2.8  --  4.1   Liver Function Tests: Recent Labs  Lab 01/27/22 0701 01/30/22 0434  AST 28 21  ALT 12 16  ALKPHOS 66 66  BILITOT 0.2* 0.6  PROT 5.8* 5.9*  ALBUMIN 2.4* 2.5*   No results for input(s):  "LIPASE", "AMYLASE" in the last 168 hours. No results for input(s): "AMMONIA" in the last 168 hours. CBC: Recent Labs  Lab 01/25/22 1236 01/26/22 0521 01/27/22 0706 01/28/22 0533 01/29/22 1607  WBC 9.4 12.0* 9.7 9.7  --   HGB 7.1* 7.7* 7.1* 7.0* 7.0*  HCT 21.8* 23.6* 22.5* 21.5* 21.6*  MCV 85.5 84.9 86.2 86.0  --   PLT 218 232 223 192  --    Cardiac Enzymes: No results for input(s): "CKTOTAL", "CKMB", "CKMBINDEX", "TROPONINI" in the last 168 hours. BNP: Invalid input(s): "POCBNP" CBG: No results for input(s): "GLUCAP" in the last 168 hours. D-Dimer No results for input(s): "DDIMER" in the last 72 hours. Hgb A1c No results for input(s): "HGBA1C" in the last 72 hours. Lipid Profile Recent Labs    01/30/22 0434  TRIG 57   Thyroid function studies No results for input(s): "TSH", "T4TOTAL", "T3FREE", "THYROIDAB" in the last 72 hours.  Invalid input(s): "FREET3" Anemia work up No results for input(s): "VITAMINB12", "FOLATE", "FERRITIN", "TIBC", "IRON", "RETICCTPCT" in the last 72 hours. Urinalysis    Component Value Date/Time   COLORURINE RED (A) 01/05/2022 0856   APPEARANCEUR TURBID (A) 01/05/2022 0856   APPEARANCEUR Hazy 09/26/2011 1609   LABSPEC 1.020 01/05/2022 0856   LABSPEC 1.010 09/26/2011 1609  PHURINE  01/05/2022 0856    TEST NOT REPORTED DUE TO COLOR INTERFERENCE OF URINE PIGMENT   GLUCOSEU (A) 01/05/2022 0856    TEST NOT REPORTED DUE TO COLOR INTERFERENCE OF URINE PIGMENT   GLUCOSEU 150 mg/dL 09/26/2011 1609   HGBUR (A) 01/05/2022 0856    TEST NOT REPORTED DUE TO COLOR INTERFERENCE OF URINE PIGMENT   BILIRUBINUR (A) 01/05/2022 0856    TEST NOT REPORTED DUE TO COLOR INTERFERENCE OF URINE PIGMENT   BILIRUBINUR Negative 09/26/2011 1609   KETONESUR (A) 01/05/2022 0856    TEST NOT REPORTED DUE TO COLOR INTERFERENCE OF URINE PIGMENT   PROTEINUR (A) 01/05/2022 0856    TEST NOT REPORTED DUE TO COLOR INTERFERENCE OF URINE PIGMENT   NITRITE (A) 01/05/2022 0856     TEST NOT REPORTED DUE TO COLOR INTERFERENCE OF URINE PIGMENT   LEUKOCYTESUR (A) 01/05/2022 0856    TEST NOT REPORTED DUE TO COLOR INTERFERENCE OF URINE PIGMENT   LEUKOCYTESUR Negative 09/26/2011 1609   Sepsis Labs Recent Labs  Lab 01/25/22 1236 01/26/22 0521 01/27/22 0706 01/28/22 0533  WBC 9.4 12.0* 9.7 9.7   Microbiology No results found for this or any previous visit (from the past 240 hour(s)).   Time coordinating discharge: Over 30 minutes  SIGNED:   Shawna Clamp, MD  [facility] 01/31/2022, 10:47 AM Pager   If 7PM-7AM, please contact night-coverage

## 2022-01-31 NOTE — TOC Transition Note (Signed)
Transition of Care [facility]) - CM/SW Discharge Note   Patient Details  Name: [name]. MRN: [id number] Date of Birth: August 01, 1945  [facility] CM/SW Contact:  Candie Chroman, LCSW Phone Number: 01/31/2022, 11:13 AM   Clinical Narrative:  Patient has orders to discharge home today. RN will call report to [phone] (Room 505B). EMS transport has been arranged and he is 2nd on the list. No further concerns. CSW signing off.   Final next level of care: Skilled Nursing Facility Barriers to Discharge: Barriers Resolved   Patient Goals and CMS Choice Patient states their goals for this hospitalization and ongoing recovery are:: Patient not fully oriented.   Choice offered to / list presented to : Spouse  Discharge Placement PASRR number recieved: 01/10/22            Patient chooses bed at: Other - please specify in the comment section below: [facility]) Patient to be transferred to facility by: EMS Name of family member notified: Jannetta Quint Patient and family notified of of transfer: 01/31/22  Discharge Plan and Services     Post Acute Care Choice:  (TBD)                               Social Determinants of Health ([place]) Interventions Transportation Interventions: Inpatient TOC, Other (Comment) (Transportation provided through [employer])   Readmission Risk Interventions     No data to display

## 2022-01-31 NOTE — Discharge Instructions (Signed)
Advised to follow-up with primary care physician in 1 week. Advised to follow-up with urology in 1 month for suprapubic catheter exchange. Advised to continue current medications.

## 2022-01-31 NOTE — Progress Notes (Signed)
[facility]  PROGRESS NOTE   Subjective:   Patient seen sitting up in bed, no family at bedside  Alert Denies pain Appetite improving  Creatinine stable  Objective:  Vital signs: Blood pressure (!) 149/65, pulse 67, temperature 97.9 F (36.6 C), temperature source Oral, resp. rate 17, height '5\' 9"'$  (1.753 m), weight 62.2 kg, SpO2 100 %.  Intake/Output Summary (Last 24 hours) at 01/31/2022 1234 Last data filed at 01/31/2022 0900 Gross per 24 hour  Intake 40 ml  Output 1650 ml  Net -1610 ml    Filed Weights   01/28/22 0500 01/29/22 0345 01/30/22 0500  Weight: 63.7 kg 62.7 kg 62.2 kg     Physical Exam: General:  No acute distress  Head:  Normocephalic, atraumatic. Moist oral mucosal membranes  Eyes:  Anicteric  Lungs:   Clear to auscultation, normal effort  Heart:  S1S2 no rubs  Abdomen:   Soft, nontender, bowel sounds present  Extremities: Trace peripheral edema.  Neurologic: Alert and following simple commands  Skin:  No lesions  Access: None    Basic Metabolic Panel: Recent Labs  Lab 01/25/22 0555 01/26/22 0521 01/27/22 0701 01/28/22 0533 01/30/22 0434  NA 136 139 139 138 136  K 4.3 4.4 4.3 4.3 4.2  CL 108 112* 110 112* 108  CO2 21* 20* 20* 17* 21*  GLUCOSE 116* 118* 101* 78 86  BUN 105* 92* 114* 119* 100*  CREATININE 3.70* 3.75* 4.09* 4.00* 3.66*  CALCIUM 10.0 10.3 10.5* 9.8 9.2  MG  --   --  2.0  --  2.2  PHOS  --   --  2.8  --  4.1     CBC: Recent Labs  Lab 01/25/22 1236 01/26/22 0521 01/27/22 0706 01/28/22 0533 01/29/22 1607  WBC 9.4 12.0* 9.7 9.7  --   HGB 7.1* 7.7* 7.1* 7.0* 7.0*  HCT 21.8* 23.6* 22.5* 21.5* 21.6*  MCV 85.5 84.9 86.2 86.0  --   PLT 218 232 223 192  --       Urinalysis: No results for input(s): "COLORURINE", "LABSPEC", "PHURINE", "GLUCOSEU", "HGBUR", "BILIRUBINUR", "KETONESUR", "PROTEINUR", "UROBILINOGEN", "NITRITE", "LEUKOCYTESUR" in the last 72 hours.  Invalid input(s): "APPERANCEUR"     Imaging: No results found.   Medications:    sodium chloride Stopped (01/22/22 2041)    Chlorhexidine Gluconate Cloth  6 each Topical Daily   feeding supplement  237 mL Oral TID BM   furosemide  20 mg Intravenous Daily   multivitamin with minerals  1 tablet Oral Daily   polyethylene glycol  17 g Oral BID   sodium bicarbonate  650 mg Oral BID   sodium chloride flush  10-40 mL Intracatheter Q12H   sodium chloride flush  3 mL Intravenous Q12H    Assessment/ Plan:     Principal Problem:   Acute renal failure superimposed on stage 4 chronic kidney disease (HCC) Active Problems:   Urinary tract infection   Anemia   Essential hypertension   Gross hematuria   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HCC)   Metabolic acidosis   Hematuria   SBO (small bowel obstruction) (HCC)   Dementia without behavioral disturbance (HCC)   Hypokalemia   Acute blood loss anemia   Protein-calorie malnutrition, severe   Wound dehiscence  Troy Berry. is a 76 y.o.  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  Renal function has remained stable and adequate urine output remains. Continue to avoid nephrotoxic agents and therapies. Will schedule outpatient follow up with our office at discharge.    #2[name] hematuria: S/p suprapubic catheter placement.  Patient has atonic bladder.  Urology following.   #3: Small bowel obstruction/postop ileus: S/p reduction of hernia complicated by wound dehiscence.  Patient tolerating oral intake,     #4: Anemia with chronic kidney disease: Patient had multiple blood transfusions. We will continue to monitor    LOS: [facility] 10/27/202312:34 PM

## 2022-01-31 NOTE — TOC Progression Note (Signed)
Transition of Care (TOC) - Progression Note    Patient Details  Name: Troy Berry. MRN: [id number] Date of Birth: 1945-09-02  Transition of [facility]) CM/SW Contact  Candie Chroman, LCSW Phone Number: 01/31/2022, 8:46 AM  Clinical Narrative:  Josem Kaufmann approved. [facility] has a bed today. Sent secure chat to MD and RN to notify.   Expected Discharge Plan:  (TBD) Barriers to Discharge: Continued Medical Work up  Expected Discharge Plan and Services Expected Discharge Plan:  (TBD)     Post Acute Care Choice:  (TBD) Living arrangements for the past 2 months: Single Family Home                                       Social Determinants of Health (SDOH) Interventions Transportation Interventions: Inpatient TOC, Other (Comment) (Transportation provided through [employer])  Readmission Risk Interventions     No data to display

## 2022-01-31 NOTE — Care Management Important Message (Signed)
Important Message  Patient Details  Name: Troy Berry. MRN: [id number] Date of Birth: [birth year]   Medicare Important Message Given:  Other (see comment)  Patient left prior to arrival to room to deliver concurrent Medicare IM.    Dannette Barbara 01/31/2022, 12:36 PM

## 2022-01-31 NOTE — Progress Notes (Signed)
Troy Berry. to be D/C'd Rehab per MD order.  Discussed prescriptions and follow up appointments with the patient. Prescriptions given to patient, medication list explained in detail. Pt verbalized understanding.  Allergies as of 01/31/2022       Reactions   Ace Inhibitors    Other reaction(s): Other (See Comments) lip swelling        Medication List     STOP taking these medications    aspirin EC 81 MG tablet   Colchicine 0.6 MG Caps       TAKE these medications    acetaminophen 325 MG tablet Commonly known as: TYLENOL Take 2 tablets (650 mg total) by mouth every 6 (six) hours as needed for mild pain (or Fever >/= 101).   allopurinol 300 MG tablet Commonly known as: ZYLOPRIM Take 300 mg by mouth daily.   amLODipine 10 MG tablet Commonly known as: NORVASC Take 10 mg by mouth daily.   calcium citrate-vitamin D 315-200 MG-UNIT tablet Commonly known as: CITRACAL+D Take by mouth.   carvedilol 25 MG tablet Commonly known as: COREG Take 25 mg by mouth 2 (two) times daily with a meal.   cholecalciferol 25 MCG (1000 UNIT) tablet Commonly known as: VITAMIN D3 Take 1,000 Units by mouth daily.   CRANBERRY-CALCIUM PO Take 1 capsule by mouth daily.   FeroSul 325 (65 FE) MG tablet Generic drug: ferrous sulfate Take 325 mg by mouth daily.   furosemide 40 MG tablet Commonly known as: LASIX Take 40 mg by mouth daily.   lovastatin 40 MG tablet Commonly known as: MEVACOR Take 1 tablet by mouth every evening.   polyethylene glycol 17 g packet Commonly known as: MiraLax Take 17 g by mouth daily.               Discharge Care Instructions  (From admission, onward)           Start     Ordered   01/31/22 0000  Discharge wound care:       Comments: Follow-up wound care in nursing home.   01/31/22 1010            Vitals:   01/31/22 1125 01/31/22 1216  BP: 134/66 (!) 149/65  Pulse: 77 67  Resp: 17 17  Temp: 97.9 F (36.6 C) 97.9 F  (36.6 C)  SpO2: 100% 100%    Skin clean, dry and intact without evidence of skin break down, no evidence of skin tears noted. IV catheter discontinued intact. Site without signs and symptoms of complications. Dressing and pressure applied. Pt denies pain at this time. No complaints noted.  An After Visit Summary was printed and given to the patient. Patient escorted via EMS. Wife called. Report was attempted with no answer from facility. Will keep trying.   [place] C. Deatra Ina

## 2022-03-04 ENCOUNTER — Ambulatory Visit: Payer: Medicare HMO | Admitting: Physician Assistant

## 2022-03-04 VITALS — BP 126/59 | HR 68 | Temp 98.0°F

## 2022-03-04 DIAGNOSIS — Z435 Encounter for attention to cystostomy: Secondary | ICD-10-CM

## 2022-03-04 DIAGNOSIS — R410 Disorientation, unspecified: Secondary | ICD-10-CM

## 2022-03-04 DIAGNOSIS — R339 Retention of urine, unspecified: Secondary | ICD-10-CM | POA: Diagnosis not present

## 2022-03-04 NOTE — Progress Notes (Signed)
Suprapubic Cath Change  Patient is present today for a suprapubic catheter change due to urinary retention.  56m of water was drained from the balloon, a 18FR Silastic foley cath was removed from the tract without difficulty.  Site was cleaned and prepped in a sterile fashion with betadine.  A 18FR Silastic foley cath was replaced into the tract no complications were noted. Urine return was not noted but catheter placement was felt to be appropriate due to depth and resistance with retraction consistent with abutting against the abdominal wall, 10 ml of sterile water was inflated into the balloon and a night bag was attached for drainage.  Patient tolerated well.    Performed by: SDebroah Loop PA-C, CElberta Leatherwood CMA, and JGaspar Cola CMA  Additional notes: Patient was discharged home from SNF last week. He is notably confused today and keeps trying to arise from the exam table; he is redirectable but ultimately denies pain. History today is extremely challenging. Mrs. SSensingreports concerns about increased confusion that started today, but then states it has been happening since he got home last week, but then states a home health nurse evaluated him for this yesterday. He may have been recommended for EMS transfer based on this evaluation.  He is afebrile, VSS in clinic today, see below. I suspect progressive dementia and acute confusion in clinic today since he is outside of his home environment. Regardless, I am concerned about [name]' ability to appropriately care for Mr. SFroelichat home. I attempted to speak to their daughter via telephone today to share my concerns, but she was shopping at the time of our call and requested to call me back; I did not hear back from her.  With no evidence of acute infection, I recommend outpatient follow-up with their PCP. I put a note about this in their AVS today to share with their daughter.  Vitals:   03/04/22 1140  BP: (!) 126/59   Pulse: 68  Temp: 98 F (36.7 C)    Follow up: Return in about 4 weeks (around 04/01/2022) for SPT exchange.

## 2022-03-04 NOTE — Patient Instructions (Signed)
[name] seemed a bit more confused today. His vitals were stable and he does not have a fever, so I am not concerned about infection at this time. I recommend discussing this with his primary care provider to make sure that your family is getting the appropriate support you need to care for him as his dementia progresses.

## 2022-03-05 ENCOUNTER — Emergency Department: Payer: Medicare HMO

## 2022-03-05 ENCOUNTER — Encounter: Payer: Self-pay | Admitting: *Deleted

## 2022-03-05 ENCOUNTER — Emergency Department
Admission: EM | Admit: 2022-03-05 | Discharge: 2022-03-05 | Disposition: A | Discharge status: Home / Self Care | Payer: Medicare HMO | Attending: Emergency Medicine | Admitting: Emergency Medicine

## 2022-03-05 ENCOUNTER — Other Ambulatory Visit: Payer: Self-pay

## 2022-03-05 DIAGNOSIS — N184 Chronic kidney disease, stage 4 (severe): Secondary | ICD-10-CM | POA: Diagnosis not present

## 2022-03-05 DIAGNOSIS — Z1152 Encounter for screening for COVID-19: Secondary | ICD-10-CM | POA: Insufficient documentation

## 2022-03-05 DIAGNOSIS — K5900 Constipation, unspecified: Secondary | ICD-10-CM | POA: Diagnosis present

## 2022-03-05 DIAGNOSIS — I7121 Aneurysm of the ascending aorta, without rupture: Secondary | ICD-10-CM | POA: Diagnosis not present

## 2022-03-05 DIAGNOSIS — F0390 Unspecified dementia without behavioral disturbance: Secondary | ICD-10-CM | POA: Diagnosis not present

## 2022-03-05 DIAGNOSIS — J189 Pneumonia, unspecified organism: Secondary | ICD-10-CM | POA: Insufficient documentation

## 2022-03-05 DIAGNOSIS — I129 Hypertensive chronic kidney disease with stage 1 through stage 4 chronic kidney disease, or unspecified chronic kidney disease: Secondary | ICD-10-CM | POA: Diagnosis not present

## 2022-03-05 LAB — COMPREHENSIVE METABOLIC PANEL
ALT: 9 U/L (ref 0–44)
AST: 14 U/L — ABNORMAL LOW (ref 15–41)
Albumin: 2.7 g/dL — ABNORMAL LOW (ref 3.5–5.0)
Alkaline Phosphatase: 64 U/L (ref 38–126)
Anion gap: 7 (ref 5–15)
BUN: 55 mg/dL — ABNORMAL HIGH (ref 8–23)
CO2: 20 mmol/L — ABNORMAL LOW (ref 22–32)
Calcium: 10 mg/dL (ref 8.9–10.3)
Chloride: 108 mmol/L (ref 98–111)
Creatinine, Ser: 3.71 mg/dL — ABNORMAL HIGH (ref 0.61–1.24)
GFR, Estimated: 16 mL/min — ABNORMAL LOW (ref >60)
Glucose, Bld: 91 mg/dL (ref 70–99)
Potassium: 4.8 mmol/L (ref 3.5–5.1)
Sodium: 135 mmol/L (ref 135–145)
Total Bilirubin: 0.5 mg/dL (ref 0.3–1.2)
Total Protein: 6.4 g/dL — ABNORMAL LOW (ref 6.5–8.1)

## 2022-03-05 LAB — CBC
HCT: 25.9 % — ABNORMAL LOW (ref 39.0–52.0)
Hemoglobin: 8 g/dL — ABNORMAL LOW (ref 13.0–17.0)
MCH: 27 pg (ref 26.0–34.0)
MCHC: 30.9 g/dL (ref 30.0–36.0)
MCV: 87.5 fL (ref 80.0–100.0)
Platelets: 169 10*3/uL (ref 150–400)
RBC: 2.96 MIL/uL — ABNORMAL LOW (ref 4.22–5.81)
RDW: 15.9 % — ABNORMAL HIGH (ref 11.5–15.5)
WBC: 5.2 10*3/uL (ref 4.0–10.5)
nRBC: 0 % (ref 0.0–0.2)

## 2022-03-05 LAB — RESP PANEL BY RT-PCR (FLU A&B, COVID) ARPGX2
Influenza A by PCR: NEGATIVE
Influenza B by PCR: NEGATIVE
SARS Coronavirus 2 by RT PCR: NEGATIVE

## 2022-03-05 LAB — LIPASE, BLOOD: Lipase: 34 U/L (ref 11–51)

## 2022-03-05 MED ORDER — DOXYCYCLINE MONOHYDRATE 100 MG PO TABS: 100.0000 mg | ORAL_TABLET | Freq: Two times a day (BID) | ORAL | 0 refills | Status: AC

## 2022-03-05 MED ORDER — AMOXICILLIN-POT CLAVULANATE 875-125 MG PO TABS: 1.0000 | ORAL_TABLET | Freq: Two times a day (BID) | ORAL | 0 refills | Status: AC

## 2022-03-05 MED ORDER — DOXYCYCLINE HYCLATE 100 MG PO TABS
100.0000 mg | ORAL_TABLET | Freq: Once | ORAL | Status: AC
  Administered 2022-03-05: 100 mg via ORAL
  Filled 2022-03-05: qty 1

## 2022-03-05 MED ORDER — AMOXICILLIN-POT CLAVULANATE 875-125 MG PO TABS
1.0000 | ORAL_TABLET | Freq: Once | ORAL | Status: AC
  Administered 2022-03-05: 1 via ORAL
  Filled 2022-03-05: qty 1

## 2022-03-05 NOTE — ED Triage Notes (Signed)
Wife states pt sent to er by home health nurse for constipation.  Enema given today with some results.    Pt has a foley cath in place that was changed yesterday.  Pt denies chest pain or sob.

## 2022-03-05 NOTE — ED Provider Notes (Signed)
[facility] Provider Note    Event Date/Time   First MD Initiated Contact with Patient 03/05/22 1631     (approximate)   History   Chief Complaint Constipation   HPI Troy Berry. is a 76 y.o. male, history of CKD stage IV, hypertension, dementia, anemia, gout, presents to the emergency department for evaluation of cough and constipation.  He is joined by his wife, who states that the patient's nurse visited him today and was concerned about the wet productive cough that he had.  She is also concerned about his constipation, though she did provide him with a enema earlier today and was able to get some stool out.  He has a history of bowel obstructions.  The patient states that he does not feel any pain or discomfort anywhere and is unsure why he is here.  Denies fever/chills, chest pain, shortness of breath, abdominal pain, flank pain, nausea/vomiting, diarrhea, dysuria, headache, weakness, or dizziness/lightheadedness.  History Limitations: Dementia.        Physical Exam  Triage Vital Signs: ED Triage Vitals  Enc Vitals Group     BP 03/05/22 1546 113/61     Pulse Rate 03/05/22 1546 66     Resp 03/05/22 1546 18     Temp 03/05/22 1546 98.2 F (36.8 C)     Temp Source 03/05/22 1546 Oral     SpO2 03/05/22 1546 100 %     Weight 03/05/22 1543 136 lb 11 oz (62 kg)     Height 03/05/22 1543 '5\' 8"'$  (1.727 m)     Head Circumference --      Peak Flow --      Pain Score 03/05/22 1543 0     Pain Loc --      Pain Edu? --      Excl. in [place]? --     Most recent vital signs: Vitals:   03/05/22 1546 03/05/22 1859  BP: 113/61 (!) 136/51  Pulse: 66 62  Resp: 18 18  Temp: 98.2 F (36.8 C)   SpO2: 100% 99%    General: Awake, NAD.  Audible, wet productive cough. Skin: Warm, dry. No rashes or lesions.  Eyes: PERRL. Conjunctivae normal.  CV: Good peripheral perfusion.  Resp: Normal effort.  Moderate rhonchi appreciated bilaterally in apices and  bases. Abd: Soft, non-tender. No distention.  Neuro: At baseline. No gross neurological deficits.  Musculoskeletal: Normal ROM of all extremities.  Physical Exam    ED Results / Procedures / Treatments  Labs (all labs ordered are listed, but only abnormal results are displayed) Labs Reviewed  COMPREHENSIVE METABOLIC PANEL - Abnormal; Notable for the following components:      Result Value   CO2 20 (*)    BUN 55 (*)    Creatinine, Ser 3.71 (*)    Total Protein 6.4 (*)    Albumin 2.7 (*)    AST 14 (*)    GFR, Estimated 16 (*)    All other components within normal limits  CBC - Abnormal; Notable for the following components:   RBC 2.96 (*)    Hemoglobin 8.0 (*)    HCT 25.9 (*)    RDW 15.9 (*)    All other components within normal limits  RESP PANEL BY RT-PCR (FLU A&B, COVID) ARPGX2  LIPASE, BLOOD     EKG N/A.    RADIOLOGY  ED Provider Interpretation: I personally reviewed and interpreted these images.  CT chest/abdomen does show patchy multifocal bilateral  groundglass airspace disease, consistent with multifocal pneumonia versus nonspecific inflammatory changes.  No evidence of bowel obstruction.  CT CHEST ABDOMEN PELVIS WO CONTRAST  Result Date: 03/05/2022 CLINICAL DATA:  Constipation, bowel obstruction EXAM: CT CHEST, ABDOMEN AND PELVIS WITHOUT CONTRAST TECHNIQUE: Multidetector CT imaging of the chest, abdomen and pelvis was performed following the standard protocol without IV contrast. RADIATION DOSE REDUCTION: This exam was performed according to the departmental dose-optimization program which includes automated exposure control, adjustment of the mA and/or kV according to patient size and/or use of iterative reconstruction technique. COMPARISON:  03/05/2022, 01/15/2022 FINDINGS: CT CHEST FINDINGS Cardiovascular: Limited unenhanced imaging of the heart demonstrates persistent cardiomegaly without pericardial effusion. 4.2 cm ascending thoracic aortic aneurysm.  Evaluation of the vascular lumen is limited without IV contrast. Atherosclerosis throughout the aorta and coronary vasculature. Mediastinum/Nodes: Numerous calcified mediastinal and hilar lymph nodes consistent with previous granulomatous disease. Thyroid, trachea, and esophagus are unremarkable. Lungs/Pleura: Scattered areas of ground-glass airspace disease are seen within the left upper, left lower, and right lower lobes most consistent with multifocal infection or inflammatory change. There are trace bilateral pleural effusions. No pneumothorax. Central airways are patent. Musculoskeletal: No acute or destructive bony lesions. Degenerative changes of the bilateral shoulders. Reconstructed images demonstrate no additional findings. CT ABDOMEN PELVIS FINDINGS Hepatobiliary: Stable cholelithiasis without evidence of cholecystitis. Unremarkable unenhanced appearance of the liver. No biliary duct dilation. Pancreas: Unremarkable unenhanced appearance. Spleen: Unremarkable unenhanced appearance. Adrenals/Urinary Tract: Likely complex cyst within the upper pole left kidney measuring 6.0 by 6.3 cm reference image 53/2. Numerous other simple cysts are seen within the bilateral kidneys, stable. Evaluation of the renal parenchyma is limited without IV contrast. No urinary tract calculi or obstructive uropathy. The adrenals are stable. A suprapubic catheter decompresses the bladder, with persistent nonspecific bladder wall thickening. Stomach/Bowel: There is no bowel obstruction or ileus. Moderate retained stool throughout the colon consistent with constipation. Normal appendix right lower quadrant. No bowel wall thickening or inflammatory change. Vascular/Lymphatic: Diffuse atherosclerosis again noted. Suspected retroperitoneal lymphadenopathy has developed in the interim, largest lymph node in the left para-aortic region measuring 2.1 cm reference image 60/2. Reproductive: Marked enlargement of the prostate again noted and  unchanged. Other: There is no free fluid or free intraperitoneal gas. Stable right inguinal hernia. Musculoskeletal: No acute or destructive bony lesions. Stable degenerative changes throughout the spine and bilateral hips. Reconstructed images demonstrate no additional findings. IMPRESSION: 1. Patchy multifocal bilateral ground-glass airspace disease, most pronounced in the left upper lobe, consistent with multifocal pneumonia or nonspecific inflammatory change. 2. Trace bilateral pleural effusions. 3. Interval development of retroperitoneal lymphadenopathy, greatest in the left para-aortic region. This is of uncertain etiology. 4. Suprapubic catheter decompressing the bladder, with persistent nonspecific bladder wall thickening. 5. Stable enlarged prostate. 6. Cholelithiasis without cholecystitis. 7. No bowel obstruction or ileus. Moderate retained stool throughout the colon consistent with constipation. 8. Stable right inguinal hernia. 9. 4.2 cm ascending thoracic aortic aneurysm. Recommend annual imaging followup by CTA or MRA. This recommendation follows 2010 ACCF/AHA/AATS/ACR/ASA/SCA/SCAI/SIR/STS/SVM Guidelines for the Diagnosis and Management of Patients with Thoracic Aortic Disease. Circulation. 2010; 121: G182-X937. Aortic aneurysm NOS (ICD10-I71.9) 10.  Aortic Atherosclerosis (ICD10-I70.0). Electronically Signed   By: Randa Ngo M.D.   On: 03/05/2022 17:58   DG Abdomen 1 View  Result Date: 03/05/2022 CLINICAL DATA:  Constipation. EXAM: ABDOMEN - 1 VIEW COMPARISON:  Abdomen 01/22/2022 FINDINGS: No dilated bowel loops. Moderate stool throughout the colon. Stool in the right, transverse, and left colon. Foley catheter in  the region of the urinary bladder. No abnormal calcifications. Mild degenerative change lumbar spine. IMPRESSION: Moderate stool in the colon. Nonobstructive bowel gas pattern. Electronically Signed   By: Franchot Gallo M.D.   On: 03/05/2022 16:33    PROCEDURES:  Critical Care  performed: N/A.  Procedures    MEDICATIONS ORDERED IN ED: Medications  amoxicillin-clavulanate (AUGMENTIN) 875-125 MG per tablet 1 tablet (1 tablet Oral Given 03/05/22 1853)  doxycycline (VIBRA-TABS) tablet 100 mg (100 mg Oral Given 03/05/22 1853)     IMPRESSION / MDM / ASSESSMENT AND PLAN / ED COURSE  I reviewed the triage vital signs and the nursing notes.                              Differential diagnosis includes, but is not limited to, pneumonia, influenza, COVID-19, constipation, bowel obstruction, COPD.  ED Course Patient appears well, vitals within normal limits.  NAD.  CBC shows no leukocytosis.  Anemia present with hemoglobin of 8.0, consistent with his baseline.  CMP shows no significant electrolyte abnormalities.  Elevated creatinine 3.71, consistent with his baseline.  No transaminitis present.  Respiratory panel negative for COVID-19 or influenza.  Lipase unremarkable at 34.  Assessment/Plan Patient presents with concerns for cough and constipation.  In regards to his productive cough, he does have moderate rhonchi bilaterally on exam.  CT chest shows evidence suggestive of multifocal pneumonia versus nonspecific inflammatory changes.  He appears surprisingly well.  Satting well on room air.  No labored breathing.  He is afebrile.  Lab workup is reassuring.  Given his comorbidities, I did offer admission for antibiotics and observation, however both he and his family members do not feel that this is necessary.  I believe outpatient therapy is reasonable.  Provide him with his first dose of Augmentin and doxycycline, and provide him with a 5-day prescription for both of them.  In regards to his constipation, he does have a moderate stool burden on his CT scan, however no evidence of bowel obstruction at this time.  He is still having productive bowel movements.  Encouraged him to trial increased fiber intake, hydration, and over-the-counter laxatives such as MiraLAX to  help improve his symptoms.  They were amenable to this.  His CT scan did incidentally show a 4.2 cm ascending thoracic aortic aneurysm.  Notify them of these findings and advised him that they would need repeat CT angiogram annually to evaluate for progression.  Will discharge.  Provided the patient with anticipatory guidance, return precautions, and educational material. Encouraged the patient to return to the emergency department at any time if they begin to experience any new or worsening symptoms. Patient expressed understanding and agreed with the plan.   Patient's presentation is most consistent with acute complicated illness / injury requiring diagnostic workup.     FINAL CLINICAL IMPRESSION(S) / ED DIAGNOSES   Final diagnoses:  Pneumonia of left upper lobe due to infectious organism  Constipation, unspecified constipation type  Aneurysm of ascending aorta without rupture [facility])     Rx / DC Orders   ED Discharge Orders          Ordered    amoxicillin-clavulanate (AUGMENTIN) 875-125 MG tablet  2 times daily        03/05/22 1838    doxycycline (ADOXA) 100 MG tablet  2 times daily        03/05/22 1838  Note:  This document was prepared using Dragon voice recognition software and may include unintentional dictation errors.   Teodoro Spray, [place] 03/05/22 Glorianne [place]    Duffy Bruce, MD 03/09/22 [phone]

## 2022-03-05 NOTE — Discharge Instructions (Addendum)
-  Please take the full course of the antibiotics as prescribed.  -You may additionally take Tylenol/ibuprofen and Mucinex as well for your symptoms.  -Recommend utilizing MiraLAX over-the-counter for your constipation.  Please increase your fiber intake as well and hydrate frequently.  -We did incidentally find a thoracic aneurysm present on your CT scan.  It is harmless at this time, however you will need to have repeat CT angiogram at least once a year to ensure that it does not increase in size.  -Return to the emergency department anytime if you begin to experience any new or worsening symptoms.

## 2022-03-13 ENCOUNTER — Ambulatory Visit: Payer: Medicare HMO | Admitting: Physician Assistant

## 2022-03-17 ENCOUNTER — Other Ambulatory Visit: Payer: Self-pay

## 2022-03-17 ENCOUNTER — Emergency Department: Payer: Medicare HMO

## 2022-03-17 ENCOUNTER — Emergency Department
Admission: EM | Admit: 2022-03-17 | Discharge: 2022-03-17 | Disposition: A | Discharge status: Home / Self Care | Payer: Medicare HMO | Attending: Emergency Medicine | Admitting: Emergency Medicine

## 2022-03-17 DIAGNOSIS — R319 Hematuria, unspecified: Secondary | ICD-10-CM | POA: Diagnosis present

## 2022-03-17 DIAGNOSIS — N3001 Acute cystitis with hematuria: Secondary | ICD-10-CM | POA: Insufficient documentation

## 2022-03-17 DIAGNOSIS — N184 Chronic kidney disease, stage 4 (severe): Secondary | ICD-10-CM | POA: Diagnosis not present

## 2022-03-17 DIAGNOSIS — R4182 Altered mental status, unspecified: Secondary | ICD-10-CM | POA: Insufficient documentation

## 2022-03-17 DIAGNOSIS — I129 Hypertensive chronic kidney disease with stage 1 through stage 4 chronic kidney disease, or unspecified chronic kidney disease: Secondary | ICD-10-CM | POA: Insufficient documentation

## 2022-03-17 DIAGNOSIS — F0390 Unspecified dementia without behavioral disturbance: Secondary | ICD-10-CM | POA: Insufficient documentation

## 2022-03-17 LAB — URINALYSIS, ROUTINE W REFLEX MICROSCOPIC
Bilirubin Urine: NEGATIVE
Glucose, UA: NEGATIVE mg/dL
Ketones, ur: NEGATIVE mg/dL
Nitrite: NEGATIVE
Protein, ur: >=300 mg/dL — AB
RBC / HPF: >50 RBC/hpf — ABNORMAL HIGH (ref 0–5)
Specific Gravity, Urine: 1.013 (ref 1.005–1.030)
Squamous Epithelial / HPF: NONE SEEN (ref 0–5)
WBC, UA: >50 WBC/hpf — ABNORMAL HIGH (ref 0–5)
pH: 7 (ref 5.0–8.0)

## 2022-03-17 LAB — CBC WITH DIFFERENTIAL/PLATELET
Abs Immature Granulocytes: 0.07 10*3/uL (ref 0.00–0.07)
Basophils Absolute: 0 10*3/uL (ref 0.0–0.1)
Basophils Relative: 1 %
Eosinophils Absolute: 0.2 10*3/uL (ref 0.0–0.5)
Eosinophils Relative: 3 %
HCT: 27.1 % — ABNORMAL LOW (ref 39.0–52.0)
Hemoglobin: 8.5 g/dL — ABNORMAL LOW (ref 13.0–17.0)
Immature Granulocytes: 1 %
Lymphocytes Relative: 10 %
Lymphs Abs: 0.8 10*3/uL (ref 0.7–4.0)
MCH: 27.9 pg (ref 26.0–34.0)
MCHC: 31.4 g/dL (ref 30.0–36.0)
MCV: 88.9 fL (ref 80.0–100.0)
Monocytes Absolute: 0.6 10*3/uL (ref 0.1–1.0)
Monocytes Relative: 8 %
Neutro Abs: 6 10*3/uL (ref 1.7–7.7)
Neutrophils Relative %: 77 %
Platelets: 167 10*3/uL (ref 150–400)
RBC: 3.05 MIL/uL — ABNORMAL LOW (ref 4.22–5.81)
RDW: 17.4 % — ABNORMAL HIGH (ref 11.5–15.5)
WBC: 7.8 10*3/uL (ref 4.0–10.5)
nRBC: 0 % (ref 0.0–0.2)

## 2022-03-17 LAB — COMPREHENSIVE METABOLIC PANEL
ALT: 19 U/L (ref 0–44)
AST: 27 U/L (ref 15–41)
Albumin: 2.7 g/dL — ABNORMAL LOW (ref 3.5–5.0)
Alkaline Phosphatase: 84 U/L (ref 38–126)
Anion gap: 7 (ref 5–15)
BUN: 40 mg/dL — ABNORMAL HIGH (ref 8–23)
CO2: 23 mmol/L (ref 22–32)
Calcium: 10 mg/dL (ref 8.9–10.3)
Chloride: 103 mmol/L (ref 98–111)
Creatinine, Ser: 3.66 mg/dL — ABNORMAL HIGH (ref 0.61–1.24)
GFR, Estimated: 16 mL/min — ABNORMAL LOW (ref >60)
Glucose, Bld: 101 mg/dL — ABNORMAL HIGH (ref 70–99)
Potassium: 5.4 mmol/L — ABNORMAL HIGH (ref 3.5–5.1)
Sodium: 133 mmol/L — ABNORMAL LOW (ref 135–145)
Total Bilirubin: 0.6 mg/dL (ref 0.3–1.2)
Total Protein: 6.2 g/dL — ABNORMAL LOW (ref 6.5–8.1)

## 2022-03-17 LAB — LACTIC ACID, PLASMA: Lactic Acid, Venous: 0.7 mmol/L (ref 0.5–1.9)

## 2022-03-17 MED ORDER — SULFAMETHOXAZOLE-TRIMETHOPRIM 800-160 MG PO TABS
1.0000 | ORAL_TABLET | Freq: Once | ORAL | Status: AC
  Administered 2022-03-17: 1 via ORAL
  Filled 2022-03-17: qty 1

## 2022-03-17 MED ORDER — SULFAMETHOXAZOLE-TRIMETHOPRIM 800-160 MG PO TABS: 1.0000 | ORAL_TABLET | Freq: Two times a day (BID) | ORAL | 0 refills | Status: DC

## 2022-03-17 NOTE — ED Provider Triage Note (Signed)
Emergency Medicine Provider Triage Evaluation Note  Troy Berry , a 76 y.o. male  was evaluated in triage.  All information was gathered from the patient's wife via phone  Review of Systems  Positive:  Negative:   Physical Exam  BP (!) 113/56   Pulse (!) 58   Temp 98.3 F (36.8 C) (Axillary)   Resp 18   Ht '5\' 8"'$  (1.727 m)   Wt 62 kg   SpO2 100%   BMI 20.78 kg/m  Gen:   Awake, no distress   Resp:  Normal effort  MSK:   Moves extremities without difficulty  Other:    Medical Decision Making  Medically screening exam initiated at 1:02 PM.  Appropriate orders placed.  Troy Berry. was informed that the remainder of the evaluation will be completed by another provider, this initial triage assessment does not replace that evaluation, and the importance of remaining in the ED until their evaluation is complete.     Versie Starks, PA-C 03/17/22 1302

## 2022-03-17 NOTE — ED Notes (Signed)
Pt now speaking in full sentences. Asking to get hair cut. Informed in at ED. Wife arriving now.

## 2022-03-17 NOTE — ED Notes (Signed)
Pt to ED via [facility] from home for blood in catheter bag. HH RN did not have equipment to change out bag. Pt has had blood in urine since yesterday and also decreased urinary output since yesterday. VSS with EMS. Pt is in NAD.

## 2022-03-17 NOTE — ED Triage Notes (Signed)
Pt was left in lobby by EMS, unsure which EMS. Attempted to ask pt about [facility], pt unable to provide any information or speak coherently.   Called wife, who is on way. Wife states that home health nurse called EMS for eval for hematuria. Wife states that he often presents like this, either not speaking or speaking incoherently.   Wife states pt recently hospitalized for PNA. Was recently in SNF then came back home. Wife states ever since pt was hospitalized and stayed at SNF, pt has not walked and only can stand for a few seconds before falling down because has been weak.

## 2022-03-17 NOTE — ED Provider Notes (Signed)
[facility] Provider Note    Event Date/Time   First MD Initiated Contact with Patient 03/17/22 1532     (approximate)   History   Chief Complaint: Hematuria and Altered Mental Status   HPI  Troy Berry. is a 76 y.o. male with a history of CKD, hypertension, dementia, chronic suprapubic urinary catheter who is brought to the ED for evaluation due to hematuria noted in his catheter collection bag.  Catheter was last replaced on 03/04/2022 in urology office per outside record review.  Spouse reports the patient has been doing well at home after recent hospitalization, has home health and physical therapy and social work coming to the house.  His condition has been slowly improving, no acute decompensation associated with the hematuria.     Physical Exam   Triage Vital Signs: ED Triage Vitals  Enc Vitals Group     BP 03/17/22 1246 (!) 113/56     Pulse Rate 03/17/22 1246 (!) 58     Resp 03/17/22 1246 18     Temp 03/17/22 1246 98.3 F (36.8 C)     Temp Source 03/17/22 1246 Axillary     SpO2 03/17/22 1246 100 %     Weight 03/17/22 1237 136 lb 11 oz (62 kg)     Height 03/17/22 1237 '5\' 8"'$  (1.727 m)     Head Circumference --      Peak Flow --      Pain Score --      Pain Loc --      Pain Edu? --      Excl. in [place]? --     Most recent vital signs: Vitals:   03/17/22 1246 03/17/22 1653  BP: (!) 113/56 118/60  Pulse: (!) 58 60  Resp: 18 18  Temp: 98.3 F (36.8 C) 98 F (36.7 C)  SpO2: 100% 100%    General: Awake, no distress.  Not oriented CV:  Good peripheral perfusion.  Regular rate and rhythm Resp:  Normal effort.  Clear to auscultation bilaterally Abd:  No distention.  Soft nontender.  Suprapubic catheter in position, well-seated, no surrounding inflammatory changes or purulent drainage.  Catheter secured to patient's right thigh with a Velcro strap.  Collection tubing not colonized.  Pink-tinged clear urine in the collection  bag.    ED Results / Procedures / Treatments   Labs (all labs ordered are listed, but only abnormal results are displayed) Labs Reviewed  COMPREHENSIVE METABOLIC PANEL - Abnormal; Notable for the following components:      Result Value   Sodium 133 (*)    Potassium 5.4 (*)    Glucose, Bld 101 (*)    BUN 40 (*)    Creatinine, Ser 3.66 (*)    Total Protein 6.2 (*)    Albumin 2.7 (*)    GFR, Estimated 16 (*)    All other components within normal limits  CBC WITH DIFFERENTIAL/PLATELET - Abnormal; Notable for the following components:   RBC 3.05 (*)    Hemoglobin 8.5 (*)    HCT 27.1 (*)    RDW 17.4 (*)    All other components within normal limits  URINALYSIS, ROUTINE W REFLEX MICROSCOPIC - Abnormal; Notable for the following components:   Color, Urine AMBER (*)    APPearance CLOUDY (*)    Hgb urine dipstick LARGE (*)    Protein, ur >=300 (*)    Leukocytes,Ua LARGE (*)    RBC / HPF >50 (*)  WBC, UA >50 (*)    Bacteria, UA MANY (*)    All other components within normal limits  URINE CULTURE  LACTIC ACID, PLASMA  LACTIC ACID, PLASMA     EKG    RADIOLOGY Chest x-ray interpreted by me, appears normal.  Radiology report reviewed.   PROCEDURES:  Procedures   MEDICATIONS ORDERED IN ED: Medications  sulfamethoxazole-trimethoprim (BACTRIM DS) 800-160 MG per tablet 1 tablet (1 tablet Oral Given 03/17/22 1705)     IMPRESSION / MDM / ASSESSMENT AND PLAN / ED COURSE  I reviewed the triage vital signs and the nursing notes.                              Differential diagnosis includes, but is not limited to, UTI, renal cyst, urinary system mass, AKI, electrolyte abnormality, thrombocytopenia  Patient's presentation is most consistent with acute presentation with potential threat to life or bodily function.  Patient presents with hematuria, otherwise asymptomatic from his recent baseline.  Doing well at home with available support.  Labs show stable anemia and CKD.   Urinalysis is consistent with UTI, even in the setting of his chronic bladder catheter.  Will send a urine culture.  Reviewing prior microbiology data there shows previous infections with Providencia, resistance patterns suggest Bactrim as best choice right now.  Spouse comfortable with continuing outpatient care.  Will give a dose of Bactrim and send a prescription.  He is not septic, not in renal failure, not encephalopathic or delirious.       FINAL CLINICAL IMPRESSION(S) / ED DIAGNOSES   Final diagnoses:  Acute cystitis with hematuria  Stage 4 chronic kidney disease ([place])     Rx / DC Orders   ED Discharge Orders          Ordered    sulfamethoxazole-trimethoprim (BACTRIM DS) 800-160 MG tablet  2 times daily        03/17/22 1702             Note:  This document was prepared using Dragon voice recognition software and may include unintentional dictation errors.   Carrie Mew, MD 03/17/22 [phone]

## 2022-03-17 NOTE — ED Notes (Signed)
Foley cath irrigated with sterile saline 100 ml.  More dilute urine returned. Tolerated well

## 2022-03-17 NOTE — ED Notes (Signed)
See triage note  Presents with family  Per family the home health nurse noticed some blood in urine today  No fever

## 2022-03-19 LAB — URINE CULTURE: Culture: >=100000 — AB

## 2022-03-31 ENCOUNTER — Emergency Department
Admission: EM | Admit: 2022-03-31 | Discharge: 2022-03-31 | Disposition: A | Discharge status: Home / Self Care | Payer: Medicare HMO | Attending: Emergency Medicine | Admitting: Emergency Medicine

## 2022-03-31 DIAGNOSIS — F0390 Unspecified dementia without behavioral disturbance: Secondary | ICD-10-CM | POA: Insufficient documentation

## 2022-03-31 DIAGNOSIS — K625 Hemorrhage of anus and rectum: Secondary | ICD-10-CM | POA: Diagnosis present

## 2022-03-31 DIAGNOSIS — I129 Hypertensive chronic kidney disease with stage 1 through stage 4 chronic kidney disease, or unspecified chronic kidney disease: Secondary | ICD-10-CM | POA: Insufficient documentation

## 2022-03-31 DIAGNOSIS — Z794 Long term (current) use of insulin: Secondary | ICD-10-CM | POA: Insufficient documentation

## 2022-03-31 DIAGNOSIS — N189 Chronic kidney disease, unspecified: Secondary | ICD-10-CM | POA: Insufficient documentation

## 2022-03-31 DIAGNOSIS — E875 Hyperkalemia: Secondary | ICD-10-CM | POA: Diagnosis not present

## 2022-03-31 LAB — TYPE AND SCREEN
ABO/RH(D): A POS
Antibody Screen: NEGATIVE

## 2022-03-31 LAB — CBC WITH DIFFERENTIAL/PLATELET
Abs Immature Granulocytes: 0.06 10*3/uL (ref 0.00–0.07)
Basophils Absolute: 0 10*3/uL (ref 0.0–0.1)
Basophils Relative: 0 %
Eosinophils Absolute: 0 10*3/uL (ref 0.0–0.5)
Eosinophils Relative: 0 %
HCT: 25.5 % — ABNORMAL LOW (ref 39.0–52.0)
Hemoglobin: 8 g/dL — ABNORMAL LOW (ref 13.0–17.0)
Immature Granulocytes: 1 %
Lymphocytes Relative: 6 %
Lymphs Abs: 0.7 10*3/uL (ref 0.7–4.0)
MCH: 27.9 pg (ref 26.0–34.0)
MCHC: 31.4 g/dL (ref 30.0–36.0)
MCV: 88.9 fL (ref 80.0–100.0)
Monocytes Absolute: 0.9 10*3/uL (ref 0.1–1.0)
Monocytes Relative: 8 %
Neutro Abs: 8.8 10*3/uL — ABNORMAL HIGH (ref 1.7–7.7)
Neutrophils Relative %: 85 %
Platelets: 197 10*3/uL (ref 150–400)
RBC: 2.87 MIL/uL — ABNORMAL LOW (ref 4.22–5.81)
RDW: 18.6 % — ABNORMAL HIGH (ref 11.5–15.5)
WBC: 10.4 10*3/uL (ref 4.0–10.5)
nRBC: 0 % (ref 0.0–0.2)

## 2022-03-31 LAB — BASIC METABOLIC PANEL
Anion gap: 9 (ref 5–15)
Anion gap: 6 (ref 5–15)
BUN: 50 mg/dL — ABNORMAL HIGH (ref 8–23)
BUN: 49 mg/dL — ABNORMAL HIGH (ref 8–23)
CO2: 19 mmol/L — ABNORMAL LOW (ref 22–32)
CO2: 18 mmol/L — ABNORMAL LOW (ref 22–32)
Calcium: 10.6 mg/dL — ABNORMAL HIGH (ref 8.9–10.3)
Calcium: 9.8 mg/dL (ref 8.9–10.3)
Chloride: 103 mmol/L (ref 98–111)
Chloride: 108 mmol/L (ref 98–111)
Creatinine, Ser: 4.04 mg/dL — ABNORMAL HIGH (ref 0.61–1.24)
Creatinine, Ser: 3.97 mg/dL — ABNORMAL HIGH (ref 0.61–1.24)
GFR, Estimated: 15 mL/min — ABNORMAL LOW (ref >60)
GFR, Estimated: 15 mL/min — ABNORMAL LOW (ref >60)
Glucose, Bld: 129 mg/dL — ABNORMAL HIGH (ref 70–99)
Glucose, Bld: 137 mg/dL — ABNORMAL HIGH (ref 70–99)
Potassium: 5.7 mmol/L — ABNORMAL HIGH (ref 3.5–5.1)
Potassium: 5.1 mmol/L (ref 3.5–5.1)
Sodium: 131 mmol/L — ABNORMAL LOW (ref 135–145)
Sodium: 132 mmol/L — ABNORMAL LOW (ref 135–145)

## 2022-03-31 LAB — TROPONIN I (HIGH SENSITIVITY)
Troponin I (High Sensitivity): 34 ng/L — ABNORMAL HIGH (ref <18)
Troponin I (High Sensitivity): 30 ng/L — ABNORMAL HIGH (ref <18)

## 2022-03-31 LAB — CBG MONITORING, ED: Glucose-Capillary: 121 mg/dL — ABNORMAL HIGH (ref 70–99)

## 2022-03-31 MED ORDER — FUROSEMIDE 10 MG/ML IJ SOLN
40.0000 mg | Freq: Once | INTRAMUSCULAR | Status: AC
  Administered 2022-03-31: 40 mg via INTRAVENOUS
  Filled 2022-03-31: qty 4

## 2022-03-31 MED ORDER — INSULIN ASPART 100 UNIT/ML IV SOLN
5.0000 [IU] | Freq: Once | INTRAVENOUS | Status: AC
  Administered 2022-03-31: 5 [IU] via INTRAVENOUS
  Filled 2022-03-31: qty 0.05

## 2022-03-31 MED ORDER — SODIUM CHLORIDE 0.9 % IV BOLUS
1000.0000 mL | Freq: Once | INTRAVENOUS | Status: AC
  Administered 2022-03-31: 1000 mL via INTRAVENOUS

## 2022-03-31 MED ORDER — DEXTROSE 50 % IV SOLN
1.0000 | Freq: Once | INTRAVENOUS | Status: AC
  Administered 2022-03-31: 50 mL via INTRAVENOUS
  Filled 2022-03-31: qty 50

## 2022-03-31 NOTE — ED Triage Notes (Signed)
Patient is here today with his wife who reports a single episode of bright red blood in his brief; Wife is unsure where the blood is coming from but patient has an indwelling Foley (for urinary retention) that has gross hematuria

## 2022-03-31 NOTE — ED Provider Notes (Signed)
[facility] Provider Note    Event Date/Time   First MD Initiated Contact with Patient 03/31/22 1705     (approximate)   History   Rectal Bleeding (Patient is here today with his wife who reports a single episode of bright red blood in his brief; Wife is unsure where the blood is coming from but patient has an indwelling Foley (for urinary retention) that has gross hematuria)   HPI  Troy Berry. is a 76 y.o. male   Past medical history of CKD chronic anemia, hypertension, urinary retention status post suprapubic catheter placement, hypertension, dementia, who presents with blood around his stools discovered in his diaper this morning.  Ongoing gross hematuria from chronic indwelling suprapubic catheter that has been unchanged, not acute.  Patient is demented at baseline offers no other medical complaints, wife says that he has had no complaints and she is concerned only for the blood in his diaper concern for bloody stool.  No blood thinner use.  No nausea vomiting, no fever, no cough .   History was obtained via limited by dementia from patient.  Most other info as above from wife.       Physical Exam   Triage Vital Signs: ED Triage Vitals  Enc Vitals Group     BP 03/31/22 1240 (!) 130/56     Pulse Rate 03/31/22 1240 71     Resp 03/31/22 1240 19     Temp 03/31/22 1240 98.1 F (36.7 C)     Temp Source 03/31/22 1632 Oral     SpO2 03/31/22 1240 99 %     Weight --      Height --      Head Circumference --      Peak Flow --      Pain Score 03/31/22 1238 0     Pain Loc --      Pain Edu? --      Excl. in [place]? --     Most recent vital signs: Vitals:   03/31/22 2030 03/31/22 2100  BP: (!) 149/62 (!) 148/65  Pulse: 61 64  Resp: 17 19  Temp:    SpO2: 99% 100%    General: Awake, no distress.  CV:  Good peripheral perfusion.  Resp:  Normal effort.  Abd:  No distention.  Other:  Soft and nontender abdomen, suprapubic catheter intact,  site appears clean dry and intact and there is gross hematuria in bag which is unchanged for many weeks per wife.  Patient is disoriented, which is at baseline.  Lungs clear, hemodynamics appropriate and reassuring, no fever.  Exam with no gross blood or melena, green stool guaiac negative   ED Results / Procedures / Treatments   Labs (all labs ordered are listed, but only abnormal results are displayed) Labs Reviewed  BASIC METABOLIC PANEL - Abnormal; Notable for the following components:      Result Value   Sodium 131 (*)    Potassium 5.7 (*)    CO2 19 (*)    Glucose, Bld 129 (*)    BUN 50 (*)    Creatinine, Ser 4.04 (*)    Calcium 10.6 (*)    GFR, Estimated 15 (*)    All other components within normal limits  CBC WITH DIFFERENTIAL/PLATELET - Abnormal; Notable for the following components:   RBC 2.87 (*)    Hemoglobin 8.0 (*)    HCT 25.5 (*)    RDW 18.6 (*)    Neutro  Abs 8.8 (*)    All other components within normal limits  BASIC METABOLIC PANEL - Abnormal; Notable for the following components:   Sodium 132 (*)    CO2 18 (*)    Glucose, Bld 137 (*)    BUN 49 (*)    Creatinine, Ser 3.97 (*)    GFR, Estimated 15 (*)    All other components within normal limits  CBG MONITORING, ED - Abnormal; Notable for the following components:   Glucose-Capillary 121 (*)    All other components within normal limits  TROPONIN I (HIGH SENSITIVITY) - Abnormal; Notable for the following components:   Troponin I (High Sensitivity) 34 (*)    All other components within normal limits  TROPONIN I (HIGH SENSITIVITY) - Abnormal; Notable for the following components:   Troponin I (High Sensitivity) 30 (*)    All other components within normal limits  CBC WITH DIFFERENTIAL/PLATELET  TYPE AND SCREEN     I reviewed labs and they are notable for baseline hemoglobin 8.0, creatinine at baseline 4.04  EKG  ED ECG REPORT I, Lucillie Garfinkel, the attending physician, personally viewed and interpreted  this ECG.   Date: 03/31/2022  EKG Time: 1720  Rate: 63  Rhythm: normal sinus rhythm  Axis: nl  Intervals:none  ST&T Change: ST elevation in V2 and V3 some T wave inversions in V4 through V6     PROCEDURES:  Critical Care performed: No  Procedures   MEDICATIONS ORDERED IN ED: Medications  furosemide (LASIX) injection 40 mg (40 mg Intravenous Given 03/31/22 1854)  sodium chloride 0.9 % bolus 1,000 mL (0 mLs Intravenous Stopped 03/31/22 2051)  insulin aspart (novoLOG) injection 5 Units (5 Units Intravenous Given 03/31/22 1853)    And  dextrose 50 % solution 50 mL (50 mLs Intravenous Given 03/31/22 1854)    IMPRESSION / MDM / ASSESSMENT AND PLAN / ED COURSE  I reviewed the triage vital signs and the nursing notes.                              Differential diagnosis includes, but is not limited to, lower GI bleeding, intra-abdominal infection, urinary tract infection, blood loss anemia   The patient is on the cardiac monitor to evaluate for evidence of arrhythmia and/or significant heart rate changes.  MDM: Patient with report of lower GI bleeding only no other acute medical complaints however in triage workup obtained including EKG with some new EKG changes, blood work including electrolytes were reviewed and he is hyperkalemic without hyperkalemic changes on EKG 5.7 - same blood drawn at the time for cbc was hemolyzed from lab and required redraw; perhaps an element of hemolysis but will regardless treat the hyperkalemia and re-test.    He is demented and does not state that he has chest pain, will add on tropes given EKG changes, no STEMI.  In terms of lower GI bleeding there is no evidence of bleeding on my rectal exam as above and hemoglobin has been stable and hemodynamics appropriate and reassuring, doubt significant bleed, will have him follow-up with PMD and GI as needed.  Fortunately the repeat potassium came down to 5.1 after treatment.  He remains hemodynamically  stable.  No further rectal bleeding.  Troponins flat x 2.  In summary this patient with single chief complaint of rectal bleeding with no evidence of bleeding on exam, normal H&H and hemodynamics with incidental hyperK mild w/o ECG changes may  be an element of hemolysis but treated and normal on repeat (renal function at baseline) and TWI on ECG w/o chest pain and w flat trops.   Considered admission for observation but given normalization of K and flat trops and no evidence of bleeding, I think he can be dc'd with close follow up.   I will have him follow-up with PMD to discuss GI as needed as an outpatient.  Return precautions given.  Patient's presentation is most consistent with acute presentation with potential threat to life or bodily function.       FINAL CLINICAL IMPRESSION(S) / ED DIAGNOSES   Final diagnoses:  Rectal bleeding  Hyperkalemia     Rx / DC Orders   ED Discharge Orders     None        Note:  This document was prepared using Dragon voice recognition software and may include unintentional dictation errors.    Lucillie Garfinkel, MD 03/31/22 2143    Lucillie Garfinkel, MD 04/01/22 [phone]

## 2022-03-31 NOTE — ED Provider Triage Note (Signed)
Emergency Medicine Provider Triage Evaluation Note  Troy Berry , a 76 y.o. male  was evaluated in triage.  Pt complains of what wife presumes as blood in his Depend this morning. She states she has never noticed it before. Patient denies abdominal pain.  Physical Exam  There were no vitals taken for this visit. Gen:   Awake, no distress   Resp:  Normal effort  MSK:   Moves extremities without difficulty  Other:    Medical Decision Making  Medically screening exam initiated at 12:38 PM.  Appropriate orders placed.  Troy Berry. was informed that the remainder of the evaluation will be completed by another provider, this initial triage assessment does not replace that evaluation, and the importance of remaining in the ED until their evaluation is complete.   Victorino Dike, FNP 03/31/22 1337

## 2022-03-31 NOTE — Discharge Instructions (Signed)
Please follow-up with your primary doctor to discuss a referral to gastroenterology if you continue to see bleeding per rectum.  Please follow-up with your urologist as planned for reassessment of your suprapubic catheter and the blood in your urine.  Thank you for choosing [place] for your health care today!  Please see your primary doctor this week for a follow up appointment.   Sometimes, in the early stages of certain disease courses it is difficult to detect in the emergency department evaluation -- so, it is important that you continue to monitor your symptoms and call your doctor right away or return to the emergency department if you develop any new or worsening symptoms.  Please go to the following website to schedule new (and existing) patient appointments:   [email or web address]  If you do not have a primary doctor try calling the following clinics to establish care:  If you have insurance:  [facility] [phone] [place] [address]   [facility]  [phone] [place] [place] [address]   If you do not have insurance:  [facility]  [phone] [address], [place] [address]   The following is another list of primary care offices in the area who are accepting new patients at this time.  Please reach out to one of them directly and let them know you would like to schedule an appointment to follow up on an Emergency Department visit, and/or to establish a new primary care provider (PCP).  There are likely other primary care clinics in the are who are accepting new patients, but this is an excellent place to start:  [place] physician: Dr Lavon Paganini [address] [place], [place] [address] [phone]  [facility] Lead Physician: Dr Steele Sizer [address], [place], [place] [address] [phone]  [place] Physician: Dr Park Liter [address] [place], [place] [address] [phone]  [facility] Lead Physician: Dr Dewaine Oats [place], [place], [place] [address] [phone]  [facility] Physician: Dr Halina Maidens [address] Colin Broach [place] [employer], [place] [address] [phone]   It was my pleasure to care for you today.   Hoover Brunette Jacelyn Grip, MD

## 2022-03-31 NOTE — ED Notes (Signed)
Lab called, CBC needs to be redrawn, was hemolyzed.

## 2022-04-03 ENCOUNTER — Ambulatory Visit: Payer: Medicare HMO | Admitting: Physician Assistant

## 2022-04-03 DIAGNOSIS — R339 Retention of urine, unspecified: Secondary | ICD-10-CM | POA: Diagnosis not present

## 2022-04-03 DIAGNOSIS — R310 Gross hematuria: Secondary | ICD-10-CM

## 2022-04-03 DIAGNOSIS — Z435 Encounter for attention to cystostomy: Secondary | ICD-10-CM

## 2022-04-03 MED ORDER — FINASTERIDE 5 MG PO TABS: 5.0000 mg | ORAL_TABLET | Freq: Every day | ORAL | 11 refills | Status: DC

## 2022-04-03 NOTE — Progress Notes (Signed)
04/03/2022 2:00 PM   [place]. 1946/03/14 [id number]  CC: Chief Complaint  Patient presents with   Hematuria   Urinary Retention   HPI: Troy Berry. is a 76 y.o. male with PMH CKD, dementia, chronic urinary retention managed with SPT and recent gross hematuria who presents today for routine SPT change and to discuss his ongoing hematuria.   Today he reports generalized pain. He is unable to describe the pain or give more information. He appears at his cognitive baseline today and is restless, attempting to arise from the exam table during his SPT change. He is somewhat redirectable.  SPT in place draining red urine. He was seen in the [facility] ED on 12/25 with possible rectal bleeding. Hemoglobin was stable at 8.0.  PMH: Past Medical History:  Diagnosis Date   Anemia    CKD (chronic kidney disease), stage III ([place])    Gout    Hypertension    Urinary retention    in-dwelling Foley since Nov 2019, sees Dr. Hollice Espy    Surgical History: Past Surgical History:  Procedure Laterality Date   IR CATHETER TUBE CHANGE  12/30/2021   IR RADIOLOGIST EVAL & MGMT  01/01/2022   LAPAROTOMY N/A 01/07/2022   Procedure: EXPLORATORY LAPAROTOMY;  Surgeon: Herbert Pun, MD;  Location: [facility] ORS;  Service: General;  Laterality: N/A;   LAPAROTOMY N/A 01/10/2022   Procedure: EXPLORATORY LAPAROTOMY, Closure of Opened Abdominal Incision Wound;  Surgeon: Herbert Pun, MD;  Location: [facility] ORS;  Service: General;  Laterality: N/A;    Home Medications:  Allergies as of 04/03/2022       Reactions   Ace Inhibitors    Other reaction(s): Other (See Comments) lip swelling        Medication List        Accurate as of April 03, 2022  2:00 PM. If you have any questions, ask your nurse or doctor.          acetaminophen 325 MG tablet Commonly known as: TYLENOL Take 2 tablets (650 mg total) by mouth every 6 (six) hours as needed for mild pain (or Fever >/=  101).   allopurinol 300 MG tablet Commonly known as: ZYLOPRIM Take 300 mg by mouth daily.   amLODipine 10 MG tablet Commonly known as: NORVASC Take 10 mg by mouth daily.   calcium citrate-vitamin D 315-200 MG-UNIT tablet Commonly known as: CITRACAL+D Take by mouth.   carvedilol 25 MG tablet Commonly known as: COREG Take 25 mg by mouth 2 (two) times daily with a meal.   cholecalciferol 25 MCG (1000 UNIT) tablet Commonly known as: VITAMIN D3 Take 1,000 Units by mouth daily.   CRANBERRY-CALCIUM PO Take 1 capsule by mouth daily.   FeroSul 325 (65 FE) MG tablet Generic drug: ferrous sulfate Take 325 mg by mouth daily.   furosemide 40 MG tablet Commonly known as: LASIX Take 40 mg by mouth daily.   lovastatin 40 MG tablet Commonly known as: MEVACOR Take 1 tablet by mouth every evening.   polyethylene glycol 17 g packet Commonly known as: MiraLax Take 17 g by mouth daily.   sulfamethoxazole-trimethoprim 800-160 MG tablet Commonly known as: Bactrim DS Take 1 tablet by mouth 2 (two) times daily.        Allergies:  Allergies  Allergen Reactions   Ace Inhibitors     Other reaction(s): Other (See Comments) lip swelling    Family History: Family History  Problem Relation Age of Onset   Cancer  Father     Social History:   reports that he quit smoking about 24 years ago. His smoking use included cigarettes. He has been exposed to tobacco smoke. He has never used smokeless tobacco. He reports current alcohol use. He reports that he does not use drugs.  Physical Exam: There were no vitals taken for this visit.  Constitutional:  Awake, restless and attempting to arise from the table during the procedure, somewhat redirectable; frail appearing HEENT: [place], AT Cardiovascular: No clubbing, cyanosis, or edema Respiratory: Normal respiratory effort, no increased work of breathing Skin: No rashes, bruises or suspicious lesions Neurologic: Grossly intact, no focal  deficits, moving all 4 extremities; requires heavy assist for transfers Psychiatric: Normal mood and affect  Suprapubic Cath Change  Patient is present today for a suprapubic catheter change due to urinary retention.  37m of water was drained from the balloon, a 18FR Silastic foley cath was removed from the tract with out difficulty.  Site was cleaned and prepped in a sterile fashion with betadine.  A 18FR Silastic foley cath was replaced into the tract no complications were noted. Urine return was noted, 10 ml of sterile water was inflated into the balloon and a night bag was attached for drainage.  Patient tolerated well.    Performed by: SDebroah Loop PA-C   Assessment & Plan:   1. Encounter for care or replacement of suprapubic tube ([place] Routine SPT change as above.  2. Hematuria, gross Ongoing; fortunately blood counts are stable. No indication for urgent intervention at this time.  I requested that the patient's daughter [place] following his SPT change today to discuss his gross hematuria. I attempted to explain to them the extreme challenge of working this up further, as his renal function precludes the use of iodinated contrast, his dementia and restlessness make me doubt his ability to tolerate MR, and his recent prolonged ileus make me hesitant to pursue bilateral retrograde pyelograms in the OR due to anesthesia risks. Before I was able to complete this explanation, the patient's daughter interrupted me and stated "Instead of telling me what you can't do, why won't you give me some options of what you can do."  I offered them in-office cystoscopy, however she interrupted me again to tell me to schedule the test before I was could express my concerns that again, with his dementia and restlessness, I am concerned he will not be able to tolerate this. I was ultimately able to offer reinitiation of finasteride in case his bleeding is prostatic in origin, and I did  explain that this may take several months to work and would not be beneficial if his bleeding is from another urinary source.  Will schedule attempted cysto and SPT change with Dr. BErlene Quannext month. - finasteride (PROSCAR) 5 MG tablet; Take 1 tablet (5 mg total) by mouth daily.  Dispense: 30 tablet; Refill: 11   Return in about 4 weeks (around 05/01/2022) for Cysto with Dr. BErlene Quanand SPT change.  SDebroah Loop PA-C  [facility] [address] [place] [phone]

## 2022-04-07 ENCOUNTER — Emergency Department: Payer: Medicare HMO

## 2022-04-07 ENCOUNTER — Encounter: Payer: Self-pay | Admitting: Emergency Medicine

## 2022-04-07 ENCOUNTER — Inpatient Hospital Stay
Admission: EM | Admit: 2022-04-07 | Discharge: 2022-04-14 | DRG: 698 | Disposition: A | Discharge status: Hospice | Payer: Medicare HMO | Attending: Internal Medicine | Admitting: Internal Medicine

## 2022-04-07 DIAGNOSIS — J189 Pneumonia, unspecified organism: Secondary | ICD-10-CM

## 2022-04-07 DIAGNOSIS — N3001 Acute cystitis with hematuria: Secondary | ICD-10-CM | POA: Insufficient documentation

## 2022-04-07 DIAGNOSIS — R188 Other ascites: Secondary | ICD-10-CM | POA: Diagnosis present

## 2022-04-07 DIAGNOSIS — Z79899 Other long term (current) drug therapy: Secondary | ICD-10-CM

## 2022-04-07 DIAGNOSIS — E875 Hyperkalemia: Secondary | ICD-10-CM | POA: Diagnosis not present

## 2022-04-07 DIAGNOSIS — N2889 Other specified disorders of kidney and ureter: Secondary | ICD-10-CM

## 2022-04-07 DIAGNOSIS — D62 Acute posthemorrhagic anemia: Secondary | ICD-10-CM | POA: Diagnosis present

## 2022-04-07 DIAGNOSIS — R0600 Dyspnea, unspecified: Secondary | ICD-10-CM

## 2022-04-07 DIAGNOSIS — N401 Enlarged prostate with lower urinary tract symptoms: Secondary | ICD-10-CM | POA: Diagnosis present

## 2022-04-07 DIAGNOSIS — R338 Other retention of urine: Secondary | ICD-10-CM | POA: Diagnosis present

## 2022-04-07 DIAGNOSIS — I129 Hypertensive chronic kidney disease with stage 1 through stage 4 chronic kidney disease, or unspecified chronic kidney disease: Secondary | ICD-10-CM | POA: Diagnosis present

## 2022-04-07 DIAGNOSIS — D631 Anemia in chronic kidney disease: Secondary | ICD-10-CM | POA: Diagnosis present

## 2022-04-07 DIAGNOSIS — Z66 Do not resuscitate: Secondary | ICD-10-CM | POA: Diagnosis not present

## 2022-04-07 DIAGNOSIS — R413 Other amnesia: Secondary | ICD-10-CM

## 2022-04-07 DIAGNOSIS — E871 Hypo-osmolality and hyponatremia: Secondary | ICD-10-CM | POA: Diagnosis present

## 2022-04-07 DIAGNOSIS — Z1152 Encounter for screening for COVID-19: Secondary | ICD-10-CM

## 2022-04-07 DIAGNOSIS — E785 Hyperlipidemia, unspecified: Secondary | ICD-10-CM | POA: Insufficient documentation

## 2022-04-07 DIAGNOSIS — T83511A Infection and inflammatory reaction due to indwelling urethral catheter, initial encounter: Principal | ICD-10-CM | POA: Diagnosis present

## 2022-04-07 DIAGNOSIS — N2581 Secondary hyperparathyroidism of renal origin: Secondary | ICD-10-CM | POA: Diagnosis present

## 2022-04-07 DIAGNOSIS — K219 Gastro-esophageal reflux disease without esophagitis: Secondary | ICD-10-CM | POA: Diagnosis present

## 2022-04-07 DIAGNOSIS — C7951 Secondary malignant neoplasm of bone: Secondary | ICD-10-CM | POA: Diagnosis present

## 2022-04-07 DIAGNOSIS — I10 Essential (primary) hypertension: Secondary | ICD-10-CM | POA: Diagnosis present

## 2022-04-07 DIAGNOSIS — Y828 Other medical devices associated with adverse incidents: Secondary | ICD-10-CM | POA: Diagnosis present

## 2022-04-07 DIAGNOSIS — Z515 Encounter for palliative care: Secondary | ICD-10-CM

## 2022-04-07 DIAGNOSIS — Z6823 Body mass index (BMI) 23.0-23.9, adult: Secondary | ICD-10-CM

## 2022-04-07 DIAGNOSIS — K920 Hematemesis: Secondary | ICD-10-CM | POA: Diagnosis present

## 2022-04-07 DIAGNOSIS — C799 Secondary malignant neoplasm of unspecified site: Secondary | ICD-10-CM

## 2022-04-07 DIAGNOSIS — E43 Unspecified severe protein-calorie malnutrition: Secondary | ICD-10-CM | POA: Diagnosis present

## 2022-04-07 DIAGNOSIS — C787 Secondary malignant neoplasm of liver and intrahepatic bile duct: Secondary | ICD-10-CM | POA: Diagnosis present

## 2022-04-07 DIAGNOSIS — B961 Klebsiella pneumoniae [K. pneumoniae] as the cause of diseases classified elsewhere: Secondary | ICD-10-CM | POA: Diagnosis present

## 2022-04-07 DIAGNOSIS — I959 Hypotension, unspecified: Secondary | ICD-10-CM

## 2022-04-07 DIAGNOSIS — I700 Atherosclerosis of aorta: Secondary | ICD-10-CM | POA: Diagnosis present

## 2022-04-07 DIAGNOSIS — Z7401 Bed confinement status: Secondary | ICD-10-CM

## 2022-04-07 DIAGNOSIS — R319 Hematuria, unspecified: Secondary | ICD-10-CM

## 2022-04-07 DIAGNOSIS — K8020 Calculus of gallbladder without cholecystitis without obstruction: Secondary | ICD-10-CM | POA: Diagnosis present

## 2022-04-07 DIAGNOSIS — N261 Atrophy of kidney (terminal): Secondary | ICD-10-CM | POA: Diagnosis present

## 2022-04-07 DIAGNOSIS — Z87891 Personal history of nicotine dependence: Secondary | ICD-10-CM

## 2022-04-07 DIAGNOSIS — N200 Calculus of kidney: Secondary | ICD-10-CM | POA: Diagnosis present

## 2022-04-07 DIAGNOSIS — M109 Gout, unspecified: Secondary | ICD-10-CM | POA: Diagnosis present

## 2022-04-07 DIAGNOSIS — R590 Localized enlarged lymph nodes: Secondary | ICD-10-CM | POA: Diagnosis present

## 2022-04-07 DIAGNOSIS — K5900 Constipation, unspecified: Secondary | ICD-10-CM | POA: Diagnosis present

## 2022-04-07 DIAGNOSIS — F0390 Unspecified dementia without behavioral disturbance: Secondary | ICD-10-CM | POA: Diagnosis present

## 2022-04-07 DIAGNOSIS — R059 Cough, unspecified: Principal | ICD-10-CM

## 2022-04-07 DIAGNOSIS — D649 Anemia, unspecified: Secondary | ICD-10-CM

## 2022-04-07 DIAGNOSIS — Z809 Family history of malignant neoplasm, unspecified: Secondary | ICD-10-CM

## 2022-04-07 DIAGNOSIS — N312 Flaccid neuropathic bladder, not elsewhere classified: Secondary | ICD-10-CM | POA: Diagnosis present

## 2022-04-07 DIAGNOSIS — E872 Acidosis, unspecified: Secondary | ICD-10-CM | POA: Diagnosis present

## 2022-04-07 DIAGNOSIS — R001 Bradycardia, unspecified: Secondary | ICD-10-CM | POA: Diagnosis not present

## 2022-04-07 DIAGNOSIS — N184 Chronic kidney disease, stage 4 (severe): Secondary | ICD-10-CM | POA: Diagnosis present

## 2022-04-07 DIAGNOSIS — N281 Cyst of kidney, acquired: Secondary | ICD-10-CM | POA: Diagnosis present

## 2022-04-07 DIAGNOSIS — C772 Secondary and unspecified malignant neoplasm of intra-abdominal lymph nodes: Secondary | ICD-10-CM | POA: Diagnosis present

## 2022-04-07 LAB — COMPREHENSIVE METABOLIC PANEL
ALT: 31 U/L (ref 0–44)
AST: 41 U/L (ref 15–41)
Albumin: 2.1 g/dL — ABNORMAL LOW (ref 3.5–5.0)
Alkaline Phosphatase: 253 U/L — ABNORMAL HIGH (ref 38–126)
Anion gap: 8 (ref 5–15)
BUN: 70 mg/dL — ABNORMAL HIGH (ref 8–23)
CO2: 16 mmol/L — ABNORMAL LOW (ref 22–32)
Calcium: 8.9 mg/dL (ref 8.9–10.3)
Chloride: 91 mmol/L — ABNORMAL LOW (ref 98–111)
Creatinine, Ser: 4.04 mg/dL — ABNORMAL HIGH (ref 0.61–1.24)
GFR, Estimated: 15 mL/min — ABNORMAL LOW (ref >60)
Glucose, Bld: 133 mg/dL — ABNORMAL HIGH (ref 70–99)
Potassium: 4.7 mmol/L (ref 3.5–5.1)
Sodium: 115 mmol/L — CL (ref 135–145)
Total Bilirubin: 0.6 mg/dL (ref 0.3–1.2)
Total Protein: 5.2 g/dL — ABNORMAL LOW (ref 6.5–8.1)

## 2022-04-07 LAB — CBC
HCT: 15.4 % — ABNORMAL LOW (ref 39.0–52.0)
Hemoglobin: 5 g/dL — ABNORMAL LOW (ref 13.0–17.0)
MCH: 28.2 pg (ref 26.0–34.0)
MCHC: 32.5 g/dL (ref 30.0–36.0)
MCV: 87 fL (ref 80.0–100.0)
Platelets: 118 10*3/uL — ABNORMAL LOW (ref 150–400)
RBC: 1.77 MIL/uL — ABNORMAL LOW (ref 4.22–5.81)
RDW: 17.5 % — ABNORMAL HIGH (ref 11.5–15.5)
WBC: 8.7 10*3/uL (ref 4.0–10.5)
nRBC: 0 % (ref 0.0–0.2)

## 2022-04-07 LAB — RESP PANEL BY RT-PCR (RSV, FLU A&B, COVID)  RVPGX2
Influenza A by PCR: NEGATIVE
Influenza B by PCR: NEGATIVE
Resp Syncytial Virus by PCR: NEGATIVE
SARS Coronavirus 2 by RT PCR: NEGATIVE

## 2022-04-07 LAB — LACTIC ACID, PLASMA: Lactic Acid, Venous: 2 mmol/L (ref 0.5–1.9)

## 2022-04-07 MED ORDER — SODIUM CHLORIDE 0.9 % IV SOLN
1.0000 g | Freq: Once | INTRAVENOUS | Status: AC
  Administered 2022-04-08: 1 g via INTRAVENOUS
  Filled 2022-04-07: qty 10

## 2022-04-07 MED ORDER — SODIUM CHLORIDE 0.9 % IV SOLN: 10.0000 mL/h | Freq: Once | INTRAVENOUS | Status: DC

## 2022-04-07 MED ORDER — SODIUM CHLORIDE 0.9 % IV SOLN
500.0000 mg | Freq: Once | INTRAVENOUS | Status: AC
  Administered 2022-04-08: 500 mg via INTRAVENOUS
  Filled 2022-04-07: qty 5

## 2022-04-07 MED ORDER — SODIUM CHLORIDE 0.9 % IV BOLUS
1000.0000 mL | Freq: Once | INTRAVENOUS | Status: AC
  Administered 2022-04-07: 1000 mL via INTRAVENOUS

## 2022-04-07 NOTE — ED Provider Notes (Addendum)
I received this patient in signout.   Hemoglobin is 5 with gross hematuria ongoing for many months, as ordered for packed red blood cell transfusion.  Vital signs initially hypotensive improved with crystalloids. Remaining labs and imaging pending, will require admission.  He also has hyponatremia 115 without neurologic sequelae or seizures, got crystalloid bolus and will need repeat BMP trending during admission.  CT scan shows opacity in the lung, treat for community-acquired pneumonia.  Admit.   Lucillie Garfinkel, MD 04/07/22 Gaetano Hawthorne    Lucillie Garfinkel, MD 04/07/22 [phone]

## 2022-04-07 NOTE — ED Notes (Signed)
Pt to CT via tech

## 2022-04-07 NOTE — ED Triage Notes (Signed)
Pt arrived via [facility] from home where pt is bed-bound and taken care of by home health and family. Pts family reported to EMS that pt coughing up "black balls" and not breathing correctly since this AM. Pt also with indwelling urinary catheter that has not drained properly x3 days and presents with hematuria. Pt recently hospitalized on 03/31/22 for rectal bleeding and hyperkalemia. Pt alert but not oriented at baseline. BP 87/41 on arrival to ED triage. Dark black "blue berry" sized object seen in sputum bag after pt use.

## 2022-04-07 NOTE — ED Provider Notes (Addendum)
[facility] Provider Note    Event Date/Time   First MD Initiated Contact with Patient 04/07/22 2202     (approximate)  History   Chief Complaint: Hematuria, Shortness of Breath, and Hematemesis  HPI  Troy Berry. is a 77 y.o. male with a past medical history of anemia, CKD, hypertension, presents to the emergency department from home for shortness of breath and cough.  According to the family members patient is largely cared for by home health workers as the patient is bedbound.  States since this morning he has a very wet sounding cough and appears to have difficulty breathing at times.  They also state the patient's catheter was not draining correctly although currently is draining urine.  Patient has chronic hematuria per family for at least the last several months this is not new.  Here the patient is awake and alert will look at you when you talk to him but he is not answering most questions.  Family also states his abdomen appears distended.  Patient found to be hypotensive currently 87/41.  Patient did have an episode of emesis in the emergency department.  No blood.  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  Per report baseline creatinine around 3.5.  Patient has a suprapubic catheter.  Had hematuria at that time as well.  Patient has dementia.  Severe protein/calorie malnutrition.  Physical Exam   Triage Vital Signs: ED Triage Vitals [04/07/22 2200]  Enc Vitals Group     BP (!) 87/41     Pulse Rate (!) 55     Resp 16     Temp (!) 97.5 F (36.4 C)     Temp Source Oral     SpO2 94 %     Weight      Height      Head Circumference      Peak Flow      Pain Score      Pain Loc      Pain Edu?      Excl. in [place]?     Most recent vital  signs: Vitals:   04/07/22 2200  BP: (!) 87/41  Pulse: (!) 55  Resp: 16  Temp: (!) 97.5 F (36.4 C)  SpO2: 94%    General: Patient is awake, seems somnolent but will look at you when you talk not answering questions or at least most questions. CV:  Good peripheral perfusion.  Regular rate and rhythm  Resp:  Mild tachypnea with wet sounding cough.  Bilateral rhonchi on auscultation.  Occasional cough during evaluation Abd:  Moderate abdominal distention.  No reaction to palpation no obvious tenderness.   ED Results / Procedures / Treatments   EKG  EKG viewed and interpreted by myself shows a sinus rhythm at 55 bpm with a narrow QRS, normal axis, PR prolongation otherwise normal intervals, nonspecific ST changes.  No ST elevation.  RADIOLOGY  I have reviewed and interpreted the x-ray images.  Patient appears to have hazy opacities bilaterally more so on the right side.  Appears increased from prior EKG 03/17/2022   MEDICATIONS ORDERED IN ED: Medications  sodium chloride 0.9 % bolus 1,000 mL (has no administration in time range)     IMPRESSION / MDM / ASSESSMENT AND PLAN / ED COURSE  I reviewed the triage  vital signs and the nursing notes.  Patient's presentation is most consistent with acute presentation with potential threat to life or bodily function.  Patient presents to the emergency department for trouble breathing and wet sounding cough.  Patient appears quite somnolent however the family states the patient is often somnolent like this.  Patient does have a very wet sounding cough with rhonchi on auscultation.  Will obtain a chest x-ray.  Patient is hypotensive 87/41 we will begin IV hydration.  We will check a broad rainbow of labs.  We will obtain CT imaging of the chest and abdomen to further evaluate given the abdominal distention.  Record review shows in October patient had an SBO versus ileus this would also be on the differential.  Patient's catheter appears to be  outputting a good amount of urine although there is gross hematuria however this appears to be the patient's baseline for the last few months at least.  We will send a urinalysis and urine culture to evaluate for possible infection.  Patient is reassuringly afebrile in the emergency department.  Differential is quite broad but would include pneumonia, COVID/flu/RSV, SBO, ileus, UTI, other intra-abdominal pathology.  Patient's labs have begun to resolve.  Patient's hemoglobin is down to 5.0.  We have ordered 2 units of packed red blood cells for the patient. Highly suspect this is due to the patient's ongoing gross hematuria.  Patient will require admission to the hospital once the remainder of the patient's workup has resulted.  Blood pressure is improving very nicely with fluids currently 107/57.  Patient's lab work and imaging are pending.  Patient care signed out to oncoming provider.    CRITICAL CARE Performed by: Harvest Dark   Total critical care time: 30 minutes  Critical care time was exclusive of separately billable procedures and treating other patients.  Critical care was necessary to treat or prevent imminent or life-threatening deterioration.  Critical care was time spent personally by me on the following activities: development of treatment plan with patient and/or surrogate as well as nursing, discussions with consultants, evaluation of patient's response to treatment, examination of patient, obtaining history from patient or surrogate, ordering and performing treatments and interventions, ordering and review of laboratory studies, ordering and review of radiographic studies, pulse oximetry and re-evaluation of patient's condition.  FINAL CLINICAL IMPRESSION(S) / ED DIAGNOSES   Altered mental status Hypotension Anemia    Note:  This document was prepared using Dragon voice recognition software and may include unintentional dictation errors.   Harvest Dark,  MD 04/07/22 2313    Harvest Dark, MD 04/07/22 4742    Harvest Dark, MD 04/07/22 [phone]

## 2022-04-08 ENCOUNTER — Inpatient Hospital Stay: Payer: Medicare HMO

## 2022-04-08 DIAGNOSIS — R319 Hematuria, unspecified: Secondary | ICD-10-CM | POA: Diagnosis not present

## 2022-04-08 DIAGNOSIS — I700 Atherosclerosis of aorta: Secondary | ICD-10-CM | POA: Diagnosis present

## 2022-04-08 DIAGNOSIS — D62 Acute posthemorrhagic anemia: Secondary | ICD-10-CM | POA: Diagnosis present

## 2022-04-08 DIAGNOSIS — N2889 Other specified disorders of kidney and ureter: Secondary | ICD-10-CM | POA: Diagnosis not present

## 2022-04-08 DIAGNOSIS — D500 Iron deficiency anemia secondary to blood loss (chronic): Secondary | ICD-10-CM | POA: Diagnosis not present

## 2022-04-08 DIAGNOSIS — D509 Iron deficiency anemia, unspecified: Secondary | ICD-10-CM | POA: Diagnosis not present

## 2022-04-08 DIAGNOSIS — I10 Essential (primary) hypertension: Secondary | ICD-10-CM | POA: Diagnosis not present

## 2022-04-08 DIAGNOSIS — M109 Gout, unspecified: Secondary | ICD-10-CM | POA: Diagnosis present

## 2022-04-08 DIAGNOSIS — F0390 Unspecified dementia without behavioral disturbance: Secondary | ICD-10-CM | POA: Diagnosis present

## 2022-04-08 DIAGNOSIS — I129 Hypertensive chronic kidney disease with stage 1 through stage 4 chronic kidney disease, or unspecified chronic kidney disease: Secondary | ICD-10-CM | POA: Diagnosis present

## 2022-04-08 DIAGNOSIS — C772 Secondary and unspecified malignant neoplasm of intra-abdominal lymph nodes: Secondary | ICD-10-CM | POA: Diagnosis present

## 2022-04-08 DIAGNOSIS — Z66 Do not resuscitate: Secondary | ICD-10-CM | POA: Diagnosis not present

## 2022-04-08 DIAGNOSIS — R188 Other ascites: Secondary | ICD-10-CM | POA: Diagnosis present

## 2022-04-08 DIAGNOSIS — N184 Chronic kidney disease, stage 4 (severe): Secondary | ICD-10-CM | POA: Diagnosis present

## 2022-04-08 DIAGNOSIS — Y828 Other medical devices associated with adverse incidents: Secondary | ICD-10-CM | POA: Diagnosis present

## 2022-04-08 DIAGNOSIS — E871 Hypo-osmolality and hyponatremia: Secondary | ICD-10-CM | POA: Diagnosis not present

## 2022-04-08 DIAGNOSIS — R93422 Abnormal radiologic findings on diagnostic imaging of left kidney: Secondary | ICD-10-CM | POA: Diagnosis not present

## 2022-04-08 DIAGNOSIS — J189 Pneumonia, unspecified organism: Secondary | ICD-10-CM | POA: Diagnosis not present

## 2022-04-08 DIAGNOSIS — R9431 Abnormal electrocardiogram [ECG] [EKG]: Secondary | ICD-10-CM | POA: Diagnosis not present

## 2022-04-08 DIAGNOSIS — N2581 Secondary hyperparathyroidism of renal origin: Secondary | ICD-10-CM | POA: Diagnosis present

## 2022-04-08 DIAGNOSIS — K920 Hematemesis: Secondary | ICD-10-CM | POA: Diagnosis present

## 2022-04-08 DIAGNOSIS — T83511A Infection and inflammatory reaction due to indwelling urethral catheter, initial encounter: Secondary | ICD-10-CM | POA: Diagnosis present

## 2022-04-08 DIAGNOSIS — C787 Secondary malignant neoplasm of liver and intrahepatic bile duct: Secondary | ICD-10-CM | POA: Diagnosis present

## 2022-04-08 DIAGNOSIS — R339 Retention of urine, unspecified: Secondary | ICD-10-CM | POA: Diagnosis not present

## 2022-04-08 DIAGNOSIS — R310 Gross hematuria: Secondary | ICD-10-CM

## 2022-04-08 DIAGNOSIS — E875 Hyperkalemia: Secondary | ICD-10-CM | POA: Diagnosis not present

## 2022-04-08 DIAGNOSIS — D631 Anemia in chronic kidney disease: Secondary | ICD-10-CM | POA: Diagnosis present

## 2022-04-08 DIAGNOSIS — E785 Hyperlipidemia, unspecified: Secondary | ICD-10-CM | POA: Diagnosis present

## 2022-04-08 DIAGNOSIS — Z7189 Other specified counseling: Secondary | ICD-10-CM | POA: Diagnosis not present

## 2022-04-08 DIAGNOSIS — Z1152 Encounter for screening for COVID-19: Secondary | ICD-10-CM | POA: Diagnosis not present

## 2022-04-08 DIAGNOSIS — E872 Acidosis, unspecified: Secondary | ICD-10-CM | POA: Diagnosis present

## 2022-04-08 DIAGNOSIS — E43 Unspecified severe protein-calorie malnutrition: Secondary | ICD-10-CM | POA: Diagnosis present

## 2022-04-08 DIAGNOSIS — Z515 Encounter for palliative care: Secondary | ICD-10-CM | POA: Diagnosis not present

## 2022-04-08 DIAGNOSIS — N3001 Acute cystitis with hematuria: Secondary | ICD-10-CM | POA: Diagnosis present

## 2022-04-08 DIAGNOSIS — C7951 Secondary malignant neoplasm of bone: Secondary | ICD-10-CM | POA: Diagnosis present

## 2022-04-08 LAB — BASIC METABOLIC PANEL
Anion gap: 8 (ref 5–15)
BUN: 61 mg/dL — ABNORMAL HIGH (ref 8–23)
CO2: 15 mmol/L — ABNORMAL LOW (ref 22–32)
Calcium: 8.6 mg/dL — ABNORMAL LOW (ref 8.9–10.3)
Chloride: 95 mmol/L — ABNORMAL LOW (ref 98–111)
Creatinine, Ser: 3.66 mg/dL — ABNORMAL HIGH (ref 0.61–1.24)
GFR, Estimated: 16 mL/min — ABNORMAL LOW (ref >60)
Glucose, Bld: 118 mg/dL — ABNORMAL HIGH (ref 70–99)
Potassium: 4.8 mmol/L (ref 3.5–5.1)
Sodium: 118 mmol/L — CL (ref 135–145)

## 2022-04-08 LAB — URINALYSIS, COMPLETE (UACMP) WITH MICROSCOPIC
RBC / HPF: >50 RBC/hpf — ABNORMAL HIGH (ref 0–5)
Specific Gravity, Urine: 1.019 (ref 1.005–1.030)
Squamous Epithelial / HPF: NONE SEEN /HPF (ref 0–5)
WBC, UA: >50 WBC/hpf — ABNORMAL HIGH (ref 0–5)

## 2022-04-08 LAB — PROCALCITONIN: Procalcitonin: 9.01 ng/mL

## 2022-04-08 LAB — LACTIC ACID, PLASMA: Lactic Acid, Venous: 1.5 mmol/L (ref 0.5–1.9)

## 2022-04-08 LAB — SODIUM
Sodium: 119 mmol/L — CL (ref 135–145)
Sodium: 119 mmol/L — CL (ref 135–145)

## 2022-04-08 LAB — BRAIN NATRIURETIC PEPTIDE: B Natriuretic Peptide: 1834 pg/mL — ABNORMAL HIGH (ref 0.0–100.0)

## 2022-04-08 LAB — CBC
HCT: 24.3 % — ABNORMAL LOW (ref 39.0–52.0)
Hemoglobin: 8.2 g/dL — ABNORMAL LOW (ref 13.0–17.0)
MCH: 29.3 pg (ref 26.0–34.0)
MCHC: 33.7 g/dL (ref 30.0–36.0)
MCV: 86.8 fL (ref 80.0–100.0)
Platelets: 125 10*3/uL — ABNORMAL LOW (ref 150–400)
RBC: 2.8 MIL/uL — ABNORMAL LOW (ref 4.22–5.81)
RDW: 16.6 % — ABNORMAL HIGH (ref 11.5–15.5)
WBC: 9.6 10*3/uL (ref 4.0–10.5)
nRBC: 0 % (ref 0.0–0.2)

## 2022-04-08 LAB — PREPARE RBC (CROSSMATCH)

## 2022-04-08 LAB — TSH: TSH: 2.059 u[IU]/mL (ref 0.350–4.500)

## 2022-04-08 LAB — OSMOLALITY: Osmolality: 279 mOsm/kg (ref 275–295)

## 2022-04-08 MED ORDER — ONDANSETRON HCL 4 MG PO TABS: 4.0000 mg | ORAL_TABLET | Freq: Four times a day (QID) | ORAL | Status: DC | PRN

## 2022-04-08 MED ORDER — SODIUM CHLORIDE 0.9 % IV SOLN
1.0000 g | Freq: Once | INTRAVENOUS | Status: AC
  Administered 2022-04-08: 1 g via INTRAVENOUS
  Filled 2022-04-08: qty 10

## 2022-04-08 MED ORDER — ENOXAPARIN SODIUM 40 MG/0.4ML IJ SOSY: 40.0000 mg | PREFILLED_SYRINGE | INTRAMUSCULAR | Status: DC

## 2022-04-08 MED ORDER — MAGNESIUM HYDROXIDE 400 MG/5ML PO SUSP: 30.0000 mL | Freq: Every day | ORAL | Status: DC | PRN

## 2022-04-08 MED ORDER — SODIUM CHLORIDE 0.9 % IV SOLN: INTRAVENOUS | Status: DC

## 2022-04-08 MED ORDER — ALLOPURINOL 100 MG PO TABS
300.0000 mg | ORAL_TABLET | Freq: Every day | ORAL | Status: DC
  Administered 2022-04-08 – 2022-04-14 (×7): 300 mg via ORAL
  Filled 2022-04-08: qty 1
  Filled 2022-04-08: qty 3
  Filled 2022-04-08: qty 1
  Filled 2022-04-08 (×4): qty 3

## 2022-04-08 MED ORDER — FINASTERIDE 5 MG PO TABS
5.0000 mg | ORAL_TABLET | Freq: Every day | ORAL | Status: DC
  Administered 2022-04-08 – 2022-04-14 (×7): 5 mg via ORAL
  Filled 2022-04-08 (×7): qty 1

## 2022-04-08 MED ORDER — ACETAMINOPHEN 325 MG PO TABS
650.0000 mg | ORAL_TABLET | Freq: Four times a day (QID) | ORAL | Status: DC | PRN
  Administered 2022-04-10 – 2022-04-13 (×2): 650 mg via ORAL
  Filled 2022-04-08 (×2): qty 2

## 2022-04-08 MED ORDER — ACETAMINOPHEN 650 MG RE SUPP: 650.0000 mg | Freq: Four times a day (QID) | RECTAL | Status: DC | PRN

## 2022-04-08 MED ORDER — VITAMIN D 25 MCG (1000 UNIT) PO TABS
1000.0000 [IU] | ORAL_TABLET | Freq: Every day | ORAL | Status: DC
  Administered 2022-04-08 – 2022-04-14 (×7): 1000 [IU] via ORAL
  Filled 2022-04-08 (×7): qty 1

## 2022-04-08 MED ORDER — OYSTER SHELL CALCIUM/D3 500-5 MG-MCG PO TABS
1.0000 | ORAL_TABLET | Freq: Every day | ORAL | Status: DC
  Administered 2022-04-08 – 2022-04-14 (×7): 1 via ORAL
  Filled 2022-04-08 (×7): qty 1

## 2022-04-08 MED ORDER — FERROUS SULFATE 325 (65 FE) MG PO TABS
325.0000 mg | ORAL_TABLET | Freq: Every day | ORAL | Status: DC
  Administered 2022-04-08 – 2022-04-14 (×7): 325 mg via ORAL
  Filled 2022-04-08 (×7): qty 1

## 2022-04-08 MED ORDER — SODIUM CHLORIDE 0.9 % IV SOLN
2.0000 g | INTRAVENOUS | Status: AC
  Administered 2022-04-09 – 2022-04-12 (×4): 2 g via INTRAVENOUS
  Filled 2022-04-08 (×2): qty 20
  Filled 2022-04-08: qty 2
  Filled 2022-04-08: qty 20

## 2022-04-08 MED ORDER — CARVEDILOL 25 MG PO TABS
25.0000 mg | ORAL_TABLET | Freq: Two times a day (BID) | ORAL | Status: DC
  Administered 2022-04-08 – 2022-04-09 (×2): 25 mg via ORAL
  Filled 2022-04-08 (×3): qty 1

## 2022-04-08 MED ORDER — POLYETHYLENE GLYCOL 3350 17 G PO PACK: 17.0000 g | PACK | Freq: Every day | ORAL | Status: DC | PRN

## 2022-04-08 MED ORDER — HEPARIN SODIUM (PORCINE) 5000 UNIT/ML IJ SOLN
5000.0000 [IU] | Freq: Three times a day (TID) | INTRAMUSCULAR | Status: DC
  Administered 2022-04-08: 5000 [IU] via SUBCUTANEOUS
  Filled 2022-04-08: qty 1

## 2022-04-08 MED ORDER — PRAVASTATIN SODIUM 20 MG PO TABS
40.0000 mg | ORAL_TABLET | Freq: Every day | ORAL | Status: DC
  Administered 2022-04-09 – 2022-04-13 (×5): 40 mg via ORAL
  Filled 2022-04-08 (×5): qty 2

## 2022-04-08 MED ORDER — SODIUM CHLORIDE 0.9 % IV SOLN
500.0000 mg | INTRAVENOUS | Status: DC
  Administered 2022-04-09 – 2022-04-10 (×3): 500 mg via INTRAVENOUS
  Filled 2022-04-08: qty 5
  Filled 2022-04-08: qty 500
  Filled 2022-04-08: qty 5

## 2022-04-08 MED ORDER — TRAZODONE HCL 50 MG PO TABS: 25.0000 mg | ORAL_TABLET | Freq: Every evening | ORAL | Status: DC | PRN

## 2022-04-08 MED ORDER — AMLODIPINE BESYLATE 10 MG PO TABS
10.0000 mg | ORAL_TABLET | Freq: Every day | ORAL | Status: DC
  Administered 2022-04-08 – 2022-04-14 (×7): 10 mg via ORAL
  Filled 2022-04-08: qty 1
  Filled 2022-04-08: qty 2
  Filled 2022-04-08 (×2): qty 1
  Filled 2022-04-08: qty 2
  Filled 2022-04-08 (×2): qty 1

## 2022-04-08 MED ORDER — ONDANSETRON HCL 4 MG/2ML IJ SOLN: 4.0000 mg | Freq: Four times a day (QID) | INTRAMUSCULAR | Status: DC | PRN

## 2022-04-08 NOTE — Consult Note (Signed)
Urology Consult  I have been asked to see the patient by Dr. Reesa Chew, for evaluation and management of gross hematuria, suprapubic catheter, abnormal CT abdomen.  Chief Complaint: Dyspnea, hemoptysis, gross hematuria  History of Present Illness: Troy Berry. is a 77 y.o. year old male with PMH dementia, CKD, chronic urinary retention managed with suprapubic catheter, and chronic anemia with multiple recent ED visits with pneumonia, hematuria, and rectal bleeding who presented to the ED yesterday with reports of several days of dyspnea, hemoptysis, and gross hematuria. He has been admitted for management of acute on chronic blood loss anemia, hyponatremia, and CAP.  Admission labs notable for Hgb 5.0 (at baseline 8.0 8 days prior); WBC count 8.7; creatinine 4.04 (at baseline); sodium 115; lactate 2.0; and UA with >50 RBCs/hpf, >50 WBCs/hpf, and many bacteria. He is now s/p 2 units rRBCs and has been started on antibiotics below; urine culture pending.  CT chest, abdomen, and pelvis without contrast revealed ground-glass densities of the left upper and lower lobes suspicious for pneumonia as well as increased density of the left renal collecting system and proximal left ureter, which is dilated.  There is suspicion for hyperdense renal lesions within the superior pole of the left kidney, incompletely characterized in the absence of IV contrast.  Lastly there is a large left retroperitoneal lymph node, unclear if reactive or neoplastic in etiology.  No evidence of clot retention and suprapubic catheter is placed appropriately within the bladder lumen.  Notably, hyperdense material within the left renal collecting system was noted on CT stone study dated 01/03/2022.  At that time, it was felt to most likely represent hemorrhagic material and follow-up imaging was recommended.  A renal ultrasound was performed this morning for further evaluation of the renal lesions within the left upper kidney,  however no discrete left renal mass was identified.  Patient had a prolonged hospitalization in October associated with bladder injury due to SP tube placement through the bladder posteriorly, after which she developed SBO requiring ex lap with reduction of internal hernia, which is complicated by wound dehiscence requiring secondary wound closure.  He has had gross hematuria persistently since that time and I ultimately restarted him on finasteride last week in case his bleeding was prostatic in origin and again noted the extreme limitations in pursuing a hematuria workup due to his comorbidities, please see my inpatient consult note dated 01/28/2022 and office note from 04/03/2022.    He is unaccompanied today and asleep, unable to contribute to HPI.  Suprapubic catheter in place draining light pink urine.  Anti-infectives (From admission, onward)    Start     Dose/Rate Route Frequency Ordered Stop   04/09/22 0000  cefTRIAXone (ROCEPHIN) 2 g in sodium chloride 0.9 % 100 mL IVPB        2 g 200 mL/hr over 30 Minutes Intravenous Every 24 hours 04/08/22 0052 04/12/22 2359   04/09/22 0000  azithromycin (ZITHROMAX) 500 mg in sodium chloride 0.9 % 250 mL IVPB        500 mg 250 mL/hr over 60 Minutes Intravenous Every 24 hours 04/08/22 0052 04/12/22 2359   04/08/22 0230  cefTRIAXone (ROCEPHIN) 1 g in sodium chloride 0.9 % 100 mL IVPB        1 g 200 mL/hr over 30 Minutes Intravenous  Once 04/08/22 0226 04/08/22 0452   04/08/22 0000  cefTRIAXone (ROCEPHIN) 1 g in sodium chloride 0.9 % 100 mL IVPB  1 g 200 mL/hr over 30 Minutes Intravenous  Once 04/07/22 2358 04/08/22 0044   04/08/22 0000  azithromycin (ZITHROMAX) 500 mg in sodium chloride 0.9 % 250 mL IVPB        500 mg 250 mL/hr over 60 Minutes Intravenous  Once 04/07/22 2358 04/08/22 0156        Past Medical History:  Diagnosis Date   Anemia    CKD (chronic kidney disease), stage III ([place])    Gout    Hypertension    Urinary retention     in-dwelling [facility] since Nov 2019, sees Dr. Hollice Espy    Past Surgical History:  Procedure Laterality Date   IR CATHETER TUBE CHANGE  12/30/2021   IR RADIOLOGIST EVAL & MGMT  01/01/2022   LAPAROTOMY N/A 01/07/2022   Procedure: EXPLORATORY LAPAROTOMY;  Surgeon: Herbert Pun, MD;  Location: [facility] ORS;  Service: General;  Laterality: N/A;   LAPAROTOMY N/A 01/10/2022   Procedure: EXPLORATORY LAPAROTOMY, Closure of Opened Abdominal Incision Wound;  Surgeon: Herbert Pun, MD;  Location: [facility] ORS;  Service: General;  Laterality: N/A;    Home Medications:  Current Meds  Medication Sig   allopurinol (ZYLOPRIM) 300 MG tablet Take 300 mg by mouth daily.   amLODipine (NORVASC) 10 MG tablet Take 10 mg by mouth daily.   calcium citrate-vitamin D (CITRACAL+D) 315-200 MG-UNIT tablet Take 1 tablet by mouth daily.   carvedilol (COREG) 25 MG tablet Take 25 mg by mouth 2 (two) times daily with a meal.    cholecalciferol (VITAMIN D3) 25 MCG (1000 UT) tablet Take 1,000 Units by mouth daily.   CRANBERRY-CALCIUM PO Take 1 capsule by mouth daily.   FEROSUL 325 (65 Fe) MG tablet Take 325 mg by mouth daily.   finasteride (PROSCAR) 5 MG tablet Take 1 tablet (5 mg total) by mouth daily.   furosemide (LASIX) 40 MG tablet Take 40 mg by mouth daily.   lovastatin (MEVACOR) 40 MG tablet Take 1 tablet by mouth every evening.    Allergies:  Allergies  Allergen Reactions   Ace Inhibitors     Other reaction(s): Other (See Comments) lip swelling    Family History  Problem Relation Age of Onset   Cancer Father     Social History:  reports that he quit smoking about 24 years ago. His smoking use included cigarettes. He has been exposed to tobacco smoke. He has never used smokeless tobacco. He reports current alcohol use. He reports that he does not use drugs.  ROS: A complete review of systems was performed.  All systems are negative except for pertinent findings as noted.  Physical Exam:   Vital signs in last 24 hours: Temp:  [97.3 F (36.3 C)-98 F (36.7 C)] 98 F (36.7 C) (01/02 1205) Pulse Rate:  [49-108] 55 (01/02 1230) Resp:  [13-19] 14 (01/02 1230) BP: (87-145)/(41-73) 145/73 (01/02 1230) SpO2:  [90 %-100 %] 100 % (01/02 1230) Weight:  [67.1 kg] 67.1 kg (01/02 0228) Constitutional: Asleep, no acute distress HEENT: [place] AT, moist mucus membranes Cardiovascular: No clubbing, cyanosis, or edema Respiratory: Normal respiratory effort Skin: No rashes, bruises or suspicious lesions  Laboratory Data:  Recent Labs    04/07/22 2258 04/08/22 0933  WBC 8.7 9.6  HGB 5.0* 8.2*  HCT 15.4* 24.3*   Recent Labs    04/07/22 2258 04/08/22 0512 04/08/22 0933  NA 115* 118* 119*  K 4.7 4.8  --   CL 91* 95*  --   CO2 16* 15*  --  GLUCOSE 133* 118*  --   BUN 70* 61*  --   CREATININE 4.04* 3.66*  --   CALCIUM 8.9 8.6*  --    Urinalysis    Component Value Date/Time   COLORURINE RED (A) 04/07/2022 2336   APPEARANCEUR TURBID (A) 04/07/2022 2336   APPEARANCEUR Hazy 09/26/2011 1609   LABSPEC 1.019 04/07/2022 2336   LABSPEC 1.010 09/26/2011 1609   PHURINE  04/07/2022 2336    TEST NOT REPORTED DUE TO COLOR INTERFERENCE OF URINE PIGMENT   GLUCOSEU (A) 04/07/2022 2336    TEST NOT REPORTED DUE TO COLOR INTERFERENCE OF URINE PIGMENT   GLUCOSEU 150 mg/dL 09/26/2011 1609   HGBUR (A) 04/07/2022 2336    TEST NOT REPORTED DUE TO COLOR INTERFERENCE OF URINE PIGMENT   BILIRUBINUR (A) 04/07/2022 2336    TEST NOT REPORTED DUE TO COLOR INTERFERENCE OF URINE PIGMENT   BILIRUBINUR Negative 09/26/2011 1609   KETONESUR (A) 04/07/2022 2336    TEST NOT REPORTED DUE TO COLOR INTERFERENCE OF URINE PIGMENT   PROTEINUR (A) 04/07/2022 2336    TEST NOT REPORTED DUE TO COLOR INTERFERENCE OF URINE PIGMENT   NITRITE (A) 04/07/2022 2336    TEST NOT REPORTED DUE TO COLOR INTERFERENCE OF URINE PIGMENT   LEUKOCYTESUR (A) 04/07/2022 2336    TEST NOT REPORTED DUE TO COLOR INTERFERENCE OF URINE  PIGMENT   LEUKOCYTESUR Negative 09/26/2011 1609   Results for orders placed or performed during the hospital encounter of 04/07/22  Resp panel by RT-PCR (RSV, Flu A&B, Covid) Anterior Nasal Swab     Status: None   Collection Time: 04/07/22 10:58 PM   Specimen: Anterior Nasal Swab  Result Value Ref Range Status   SARS Coronavirus 2 by RT PCR NEGATIVE NEGATIVE Final    Comment: (NOTE) SARS-CoV-2 target nucleic acids are NOT DETECTED.  The SARS-CoV-2 RNA is generally detectable in upper respiratory specimens during the acute phase of infection. The lowest concentration of SARS-CoV-2 viral copies this assay can detect is 138 copies/mL. A negative result does not preclude SARS-Cov-2 infection and should not be used as the sole basis for treatment or other patient management decisions. A negative result may occur with  improper specimen collection/handling, submission of specimen other than nasopharyngeal swab, presence of viral mutation(s) within the areas targeted by this assay, and inadequate number of viral copies(<138 copies/mL). A negative result must be combined with clinical observations, patient history, and epidemiological information. The expected result is Negative.  Fact Sheet for Patients:  EntrepreneurPulse.com.au  Fact Sheet for Healthcare Providers:  IncredibleEmployment.be  This test is no t yet approved or cleared by the [place] FDA and  has been authorized for detection and/or diagnosis of SARS-CoV-2 by FDA under an Emergency Use Authorization (EUA). This EUA will remain  in effect (meaning this test can be used) for the duration of the COVID-19 declaration under Section 564(b)(1) of the Act, 21 U.S.C.section 360bbb-3(b)(1), unless the authorization is terminated  or revoked sooner.       Influenza A by PCR NEGATIVE NEGATIVE Final   Influenza B by PCR NEGATIVE NEGATIVE Final    Comment: (NOTE) The Xpert Xpress  SARS-CoV-2/FLU/RSV plus assay is intended as an aid in the diagnosis of influenza from Nasopharyngeal swab specimens and should not be used as a sole basis for treatment. Nasal washings and aspirates are unacceptable for Xpert Xpress SARS-CoV-2/FLU/RSV testing.  Fact Sheet for Patients: EntrepreneurPulse.com.au  Fact Sheet for Healthcare Providers: IncredibleEmployment.be  This test is not yet approved  or cleared by the [place] and has been authorized for detection and/or diagnosis of SARS-CoV-2 by FDA under an Emergency Use Authorization (EUA). This EUA will remain in effect (meaning this test can be used) for the duration of the COVID-19 declaration under Section 564(b)(1) of the Act, 21 U.S.C. section 360bbb-3(b)(1), unless the authorization is terminated or revoked.     Resp Syncytial Virus by PCR NEGATIVE NEGATIVE Final    Comment: (NOTE) Fact Sheet for Patients: EntrepreneurPulse.com.au  Fact Sheet for Healthcare Providers: IncredibleEmployment.be  This test is not yet approved or cleared by the [place] FDA and has been authorized for detection and/or diagnosis of SARS-CoV-2 by FDA under an Emergency Use Authorization (EUA). This EUA will remain in effect (meaning this test can be used) for the duration of the COVID-19 declaration under Section 564(b)(1) of the Act, 21 U.S.C. section 360bbb-3(b)(1), unless the authorization is terminated or revoked.  Performed at [facility] [address] [place], [place] [address]     Radiologic Imaging: US RENAL  Result Date: 04/08/2022 CLINICAL DATA:  Hydronephrosis, left.  Renal mass on CT. EXAM: RENAL / URINARY TRACT ULTRASOUND COMPLETE COMPARISON:  Noncontrast abdominal CT 04/07/2022 and 03/05/2022. Renal ultrasound 12/21/2017. FINDINGS: Right Kidney: Renal measurements: 10.1 x 4.0 x 4.7 cm = volume: 99.1 mL. Cortical thinning  with increased cortical echogenicity. Small renal cysts measuring up to 1.8 cm in diameter. No hydronephrosis. Left Kidney: Renal measurements: 12.3 x 6.1 x 5.7 cm = volume: 221.4 mL. Multiple cysts are noted, measuring up to 4.2 cm in the upper pole. No discrete mass is seen to correspond with the abnormality on recent CT. Bladder: Suprapubic catheter in place. The urinary bladder is mildly distended. Other: Ascites and a small left pleural effusion are noted. The prostate gland is moderately enlarged. Numerous hypoechoic liver lesions are noted, incompletely characterized. Examination limited by patient condition. IMPRESSION: 1. No discrete left renal mass identified by ultrasound to correspond with the abnormality on recent CT's. CT finding remains indeterminate and could reflect parenchymal hemorrhage or a hemorrhagic mass. Recommend further evaluation with dedicated renal CT or MRI (without and with contrast). 2. Bilateral renal cysts. 3. Right renal cortical thinning and increased cortical echogenicity, nonspecific but can be seen with chronic medical renal disease. 4. Ascites and small left pleural effusion. 5. Numerous hypoechoic liver lesions, incompletely characterized. Electronically Signed   By: Richardean Sale M.D.   On: 04/08/2022 10:33   CT CHEST ABDOMEN PELVIS WO CONTRAST  Result Date: 04/07/2022 CLINICAL DATA:  Cough EXAM: CT CHEST, ABDOMEN AND PELVIS WITHOUT CONTRAST TECHNIQUE: Multidetector CT imaging of the chest, abdomen and pelvis was performed following the standard protocol without IV contrast. RADIATION DOSE REDUCTION: This exam was performed according to the departmental dose-optimization program which includes automated exposure control, adjustment of the mA and/or kV according to patient size and/or use of iterative reconstruction technique. COMPARISON:  Chest x-ray 04/07/2022, CT 03/05/2022 FINDINGS: CT CHEST FINDINGS Cardiovascular: Limited evaluation without intravenous contrast.  Advanced aortic atherosclerosis. 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 Extensive coronary vascular calcifications. Cardiomegaly. No pericardial effusion. Mediastinum/Nodes: Midline trachea. No thyroid mass. Numerous calcified mediastinal and hilar nodes as before. Esophagus within normal limits. Lungs/Pleura: Small bilateral pleural effusions are new. Respiratory motion degradation. Small heterogeneous foci of ground-glass density in the left upper lobe and lower lobe. Musculoskeletal: Sternum is intact. Degenerative changes of the spine. No acute osseous abnormality. CT ABDOMEN PELVIS FINDINGS Hepatobiliary: No focal hepatic  abnormality. Gallstones. No biliary dilatation. Pancreas: Unremarkable. No pancreatic ductal dilatation or surrounding inflammatory changes. Spleen: Normal in size without focal abnormality. Adrenals/Urinary Tract: Adrenal glands are within normal limits. Extensive intrarenal vascular calcifications. Mild atrophy of right kidney. Bilateral renal cysts for which no imaging follow-up is recommended. Possible hyperdense lesion at the upper pole of the left kidney, series 2, image 54, limited assessment without contrast and the images are degraded by motion. Increased density in the region of left renal collecting system, series 2, image 64, and within the left ureter which appears dilated. Small left kidney stones. Suprapubic bladder catheter with small amount of air in the bladder. Stomach/Bowel: The stomach is mildly distended. There is no dilated small bowel. No acute bowel wall thickening. Negative appendix. No acute bowel wall thickening. Vascular/Lymphatic: Advanced aortic atherosclerosis. No aneurysm. Enlarged appearing left retroperitoneal lymph nodes measuring up to 2.4 cm. Reproductive: Prostate is enlarged. Mass effect on the bladder. Large fluid collection in the right groin measuring 5.6 cm. Other: No free air. Small volume perihepatic ascites.  Generalized mesenteric and subcutaneous edema. Musculoskeletal: No acute osseous abnormality. IMPRESSION: 1. Cardiomegaly with small bilateral pleural effusions. Small heterogeneous foci of ground-glass density in the left upper lobe and left lower lobe suspicious for pneumonia. 2. Increased density in the region of left renal collecting system and within the left ureter which appears dilated. Findings could be secondary to hemorrhage or infection. There are small left kidney stones. 3. Suspicion of hyperdense renal lesions superior pole left kidney, further assessment difficult without contrast, correlation with renal ultrasound could be attempted. 4. Small volume perihepatic ascites. Generalized mesenteric and subcutaneous edema suggesting anasarca. 5. Enlarged prostate with mass effect on the bladder. 6. Redemonstrated enlarged appearing left retroperitoneal lymph nodes which are either reactive or neoplastic in etiology. 7. Large fluid collection within right groin hernia. 8. Gallstones. 9. Aortic atherosclerosis. Aortic Atherosclerosis (ICD10-I70.0). Electronically Signed   By: Donavan Foil M.D.   On: 04/07/2022 23:51   DG Chest Portable 1 View  Result Date: 04/07/2022 CLINICAL DATA:  Shortness of breath and cough EXAM: PORTABLE CHEST 1 VIEW COMPARISON:  03/17/2022 FINDINGS: Cardiac shadow is enlarged. Multiple calcified hilar lymph nodes are noted. Thoracic aorta is tortuous. Vascular congestion is seen with mild interstitial edema. Small right pleural effusion is noted. No acute bony abnormality is noted. IMPRESSION: Changes consistent with CHF with small left pleural effusion. Electronically Signed   By: Inez Catalina M.D.   On: 04/07/2022 23:15    Assessment & Plan:  77 year old comorbid male with dementia, chronic urinary retention managed with suprapubic catheter, and persistent gross hematuria currently admitted with CAP, hyponatremia, and acute on chronic blood loss anemia, now s/p 2 units  PRBCs.  With only light pink urine draining from his suprapubic catheter at this time, I do not believe that this would fully account for his 3 point drop in hemoglobin over the past 8 days, especially in the absence of any evidence of clot retention in the bladder. Would recommend consideration of other sources of bleeding, especially with recent reports of rectal bleeding per family.  His recent imaging is concerning for a possible neoplasm of the left kidney, possible TCC versus RCC, however he is a poor surgical candidate to pursue biopsy; will obtain a urine cytology today instead, though a negative result would not rule this out. Ultimately if he were to be diagnosed with a urologic malignancy, he would be a poor candidate for chemotherapy or surgery due to his  comorbidities, and embolization would almost certainly lead to renal failure; I do not believe he would tolerate dialysis with his dementia.   As documented previously, I continue to have extreme concerns regarding the utility and feasibility of pursuing a hematuria workup in this patient. Additionally, he has had significant functional decline over the past several months, with family reporting on presentation yesterday that he is now bedbound. In the absence of any indication for urgent urologic intervention, I strongly recommend a palliative care consult to define goals of care in this patient.  Recommendations: -Trend CBC and transfuse as needed -Agree with hyponatremia, CAP management by primary team -Consider alternative sources of bleeding to account for significant recent drop in hemoglobin -Strongly recommend palliative care consult to determine goals of care  Thank you for involving me in this patient's care, I will continue to follow along.  Debroah Loop, PA-C 04/08/2022 1:10 PM

## 2022-04-08 NOTE — Assessment & Plan Note (Signed)
Hold Coreg with bradycardia.

## 2022-04-08 NOTE — ED Notes (Signed)
This RN fed patient approximately half of breakfast tray, patient tolerated well.

## 2022-04-08 NOTE — Assessment & Plan Note (Addendum)
Met criteria for severe sepsis with leukocytosis, tachycardia, tachypnea and acute metabolic encephalopathy in the setting of multifocal pneumonia.  Completed Rocephin and Zithromax here in the hospital. 

## 2022-04-08 NOTE — Assessment & Plan Note (Addendum)
With comfort care measures will discontinue salt tablets

## 2022-04-08 NOTE — ED Notes (Signed)
PRBC infusion titrated from 32m/hr to 1563mhr. VSS. Will continue to monitor.

## 2022-04-08 NOTE — Assessment & Plan Note (Addendum)
Discontinue allopurinol

## 2022-04-08 NOTE — ED Notes (Signed)
PRBC transfusion #1 & #2 per MD Jacelyn Grip to infuse over 2 hours.

## 2022-04-08 NOTE — Assessment & Plan Note (Addendum)
Discontinue statin

## 2022-04-08 NOTE — H&P (Signed)
Troy Berry   [name]    MR#:  [id number]  DATE OF BIRTH:  1945-06-14  DATE OF ADMISSION:  04/07/2022  PRIMARY CARE PHYSICIAN: [facility] [place]   Patient is coming from: Home  REQUESTING/REFERRING PHYSICIAN: Lucillie Garfinkel, MD  CHIEF COMPLAINT:   Chief Complaint  Patient presents with   Hematuria   Shortness of Breath   Hematemesis    HISTORY OF PRESENT ILLNESS:  Troy Berry. is a 77 y.o. African-American male with medical history significant for stage III CKD, GERD, hypertension anemia, and dementia who presented to the emergency room with acute onset of hematuria in the setting of a suprapubic catheter along with blood around the stools per his wife discovered in the diaper.  He has a history of ongoing gross hematuria.  No melena was reported.  No nausea or vomiting or heartburn.  No fever or chills.  No chest pain or palpitations.  The patient is demented at baseline and and therefore very poor historian.  It is unclear if he has been having any cough or dyspnea.  ED Course: When he came to the ER, BP was 107/57 with heart rate of 54 and respiratory rate of 14 and temperature 97.5.  Influenza antigens and COVID-19 PCR as well as RSV PCR came back negative.  CMP was remarkable for hyponatremia of 115 down from 132 on 03/31/2022, CO2 of 16 and chloride of 91 with a BUN of 70 and creatinine of 4.04 compared to 49/3.97 then.  Alk phos was 253 and albumin 2.1 with total protein 5.2 lactic acid was 2 and later 1.5 and CBC showed hemoglobin 5 hematocrit 15.4 down from 8/25 on 03/31/2022.  Platelets were 118.  Blood group was A+ with negative antibody screen. EKG as reviewed by me :  EKG showed sinus rhythm with a rate of 55 with prolonged PR interval and old Q waves inferiorly Imaging: Portable chest x-ray showed changes consistent with CHF with small left pleural effusion with inability to rule out underlying left basal infiltrate and  pneumonia.  The patient was given IV Rocephin and Zithromax was 1 L bolus of IV normal saline.  He will be admitted to a progressive unit bed for further evaluation and management. PAST MEDICAL HISTORY:   Past Medical History:  Diagnosis Date   Anemia    CKD (chronic kidney disease), stage III ([place])    Gout    Hypertension    Urinary retention    in-dwelling Foley since Nov 2019, sees Dr. Hollice Espy    PAST SURGICAL HISTORY:   Past Surgical History:  Procedure Laterality Date   IR CATHETER TUBE CHANGE  12/30/2021   IR RADIOLOGIST EVAL & MGMT  01/01/2022   LAPAROTOMY N/A 01/07/2022   Procedure: EXPLORATORY LAPAROTOMY;  Surgeon: Herbert Pun, MD;  Location: [facility] ORS;  Service: General;  Laterality: N/A;   LAPAROTOMY N/A 01/10/2022   Procedure: EXPLORATORY LAPAROTOMY, Closure of Opened Abdominal Incision Wound;  Surgeon: Herbert Pun, MD;  Location: [facility] ORS;  Service: General;  Laterality: N/A;    SOCIAL HISTORY:   Social History   Tobacco Use   Smoking status: Former    Types: Cigarettes    Quit date: 04/07/1998    Years since quitting: 24.0    Passive exposure: Past   Smokeless tobacco: Never  Substance Use Topics   Alcohol use: Yes    FAMILY HISTORY:   Family History  Problem Relation Age of Onset  Cancer Father     DRUG ALLERGIES:   Allergies  Allergen Reactions   Ace Inhibitors     Other reaction(s): Other (See Comments) lip swelling    REVIEW OF SYSTEMS:   ROS As per history of present illness. All pertinent systems were reviewed above. Constitutional, HEENT, cardiovascular, respiratory, GI, GU, musculoskeletal, neuro, psychiatric, endocrine, integumentary and hematologic systems were reviewed and are otherwise negative/unremarkable except for positive findings mentioned above in the HPI.   MEDICATIONS AT HOME:   Prior to Admission medications   Medication Sig Start Date End Date Taking? Authorizing Provider  allopurinol  (ZYLOPRIM) 300 MG tablet Take 300 mg by mouth daily. 12/08/17  Yes [provider]  amLODipine (NORVASC) 10 MG tablet Take 10 mg by mouth daily.   Yes [provider]  calcium citrate-vitamin D (CITRACAL+D) 315-200 MG-UNIT tablet Take 1 tablet by mouth daily.   Yes [provider]  carvedilol (COREG) 25 MG tablet Take 25 mg by mouth 2 (two) times daily with a meal.  05/11/18  Yes [provider]  cholecalciferol (VITAMIN D3) 25 MCG (1000 UT) tablet Take 1,000 Units by mouth daily.   Yes [provider]  CRANBERRY-CALCIUM PO Take 1 capsule by mouth daily.   Yes [provider]  FEROSUL 325 (65 Fe) MG tablet Take 325 mg by mouth daily. 12/21/21  Yes [provider]  finasteride (PROSCAR) 5 MG tablet Take 1 tablet (5 mg total) by mouth daily. 04/03/22  Yes Vaillancourt, Samantha, PA-C  furosemide (LASIX) 40 MG tablet Take 40 mg by mouth daily. 07/16/20  Yes [provider]  lovastatin (MEVACOR) 40 MG tablet Take 1 tablet by mouth every evening. 07/16/17  Yes [provider]  acetaminophen (TYLENOL) 325 MG tablet Take 2 tablets (650 mg total) by mouth every 6 (six) hours as needed for mild pain (or Fever >/= 101). 04/30/18   Vaughan Basta, MD  polyethylene glycol (MIRALAX) 17 g packet Take 17 g by mouth daily. 08/30/18   Lavonia Drafts, MD  sulfamethoxazole-trimethoprim (BACTRIM DS) 800-160 MG tablet Take 1 tablet by mouth 2 (two) times daily. Patient not taking: Reported on 04/07/2022 03/17/22   Carrie Mew, MD      VITAL SIGNS:  Blood pressure (!) 127/50, pulse (!) 52, temperature 97.6 F (36.4 C), temperature source Oral, resp. rate 17, height _0  (1.727 m), weight 67.1 kg, SpO2 96 %.  PHYSICAL EXAMINATION:  Physical Exam  GENERAL:  77 y.o.-year-old African-American male patient lying in the bed with no acute distress.  EYES: Pupils equal, round, reactive to light and accommodation. No scleral icterus.   Positive pallor.  Extraocular muscles intact.  HEENT: Head atraumatic, normocephalic. Oropharynx and nasopharynx clear.  NECK:  Supple, no jugular venous distention. No thyroid enlargement, no tenderness.  LUNGS: Diminished bibasilar breath sounds with left basal crackles.  No use of accessory muscles of respiration.  CARDIOVASCULAR: Regular rate and rhythm, S1, S2 normal. No murmurs, rubs, or gallops.  ABDOMEN: Soft, nondistended, nontender. Bowel sounds present. No organomegaly or mass.  EXTREMITIES: No pedal edema, cyanosis, or clubbing.  NEUROLOGIC: Cranial nerves II through XII are intact. Muscle strength 5/5 in all extremities. Sensation intact. Gait not checked.  PSYCHIATRIC: The patient is alert and oriented x 3.  Normal affect and good eye contact. SKIN: No obvious rash, lesion, or ulcer.   LABORATORY PANEL:   CBC Recent Labs  Lab 04/07/22 2258  WBC 8.7  HGB 5.0*  HCT 15.4*  PLT 118*   ------------------------------------------------------------------------------------------------------------------  Chemistries  Recent Labs  Lab 04/07/22 2258  NA 115*  K 4.7  CL 91*  CO2 16*  GLUCOSE 133*  BUN 70*  CREATININE 4.04*  CALCIUM 8.9  AST 41  ALT 31  ALKPHOS 253*  BILITOT 0.6   ------------------------------------------------------------------------------------------------------------------  Cardiac Enzymes No results for input(s): "TROPONINI" in the last 168 hours. ------------------------------------------------------------------------------------------------------------------  RADIOLOGY:  CT CHEST ABDOMEN PELVIS WO CONTRAST  Result Date: 04/07/2022 CLINICAL DATA:  Cough EXAM: CT CHEST, ABDOMEN AND PELVIS WITHOUT CONTRAST TECHNIQUE: Multidetector CT imaging of the chest, abdomen and pelvis was performed following the standard protocol without IV contrast. RADIATION DOSE REDUCTION: This exam was performed according to the departmental dose-optimization program  which includes automated exposure control, adjustment of the mA and/or kV according to patient size and/or use of iterative reconstruction technique. COMPARISON:  Chest x-ray 04/07/2022, CT 03/05/2022 FINDINGS: CT CHEST FINDINGS Cardiovascular: Limited evaluation without intravenous contrast. Advanced aortic atherosclerosis. 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 Extensive coronary vascular calcifications. Cardiomegaly. No pericardial effusion. Mediastinum/Nodes: Midline trachea. No thyroid mass. Numerous calcified mediastinal and hilar nodes as before. Esophagus within normal limits. Lungs/Pleura: Small bilateral pleural effusions are new. Respiratory motion degradation. Small heterogeneous foci of ground-glass density in the left upper lobe and lower lobe. Musculoskeletal: Sternum is intact. Degenerative changes of the spine. No acute osseous abnormality. CT ABDOMEN PELVIS FINDINGS Hepatobiliary: No focal hepatic abnormality. Gallstones. No biliary dilatation. Pancreas: Unremarkable. No pancreatic ductal dilatation or surrounding inflammatory changes. Spleen: Normal in size without focal abnormality. Adrenals/Urinary Tract: Adrenal glands are within normal limits. Extensive intrarenal vascular calcifications. Mild atrophy of right kidney. Bilateral renal cysts for which no imaging follow-up is recommended. Possible hyperdense lesion at the upper pole of the left kidney, series 2, image 54, limited assessment without contrast and the images are degraded by motion. Increased density in the region of left renal collecting system, series 2, image 64, and within the left ureter which appears dilated. Small left kidney stones. Suprapubic bladder catheter with small amount of air in the bladder. Stomach/Bowel: The stomach is mildly distended. There is no dilated small bowel. No acute bowel wall thickening. Negative appendix. No acute bowel wall thickening. Vascular/Lymphatic: Advanced aortic  atherosclerosis. No aneurysm. Enlarged appearing left retroperitoneal lymph nodes measuring up to 2.4 cm. Reproductive: Prostate is enlarged. Mass effect on the bladder. Large fluid collection in the right groin measuring 5.6 cm. Other: No free air. Small volume perihepatic ascites. Generalized mesenteric and subcutaneous edema. Musculoskeletal: No acute osseous abnormality. IMPRESSION: 1. Cardiomegaly with small bilateral pleural effusions. Small heterogeneous foci of ground-glass density in the left upper lobe and left lower lobe suspicious for pneumonia. 2. Increased density in the region of left renal collecting system and within the left ureter which appears dilated. Findings could be secondary to hemorrhage or infection. There are small left kidney stones. 3. Suspicion of hyperdense renal lesions superior pole left kidney, further assessment difficult without contrast, correlation with renal ultrasound could be attempted. 4. Small volume perihepatic ascites. Generalized mesenteric and subcutaneous edema suggesting anasarca. 5. Enlarged prostate with mass effect on the bladder. 6. Redemonstrated enlarged appearing left retroperitoneal lymph nodes which are either reactive or neoplastic in etiology. 7. Large fluid collection within right groin hernia. 8. Gallstones. 9. Aortic atherosclerosis. Aortic Atherosclerosis (ICD10-I70.0). Electronically Signed   By: Donavan Foil M.D.   On: 04/07/2022 23:51   DG Chest Portable 1 View  Result Date: 04/07/2022 CLINICAL DATA:  Shortness of breath and cough  EXAM: PORTABLE CHEST 1 VIEW COMPARISON:  03/17/2022 FINDINGS: Cardiac shadow is enlarged. Multiple calcified hilar lymph nodes are noted. Thoracic aorta is tortuous. Vascular congestion is seen with mild interstitial edema. Small right pleural effusion is noted. No acute bony abnormality is noted. IMPRESSION: Changes consistent with CHF with small left pleural effusion. Electronically Signed   By: Inez Catalina M.D.    On: 04/07/2022 23:15      IMPRESSION AND PLAN:  Assessment and Plan: * Hyponatremia - The patient will be hydrated with IV normal saline. - We will follow his BMP. - Hyponatremia workup will be obtained.  CAP (community acquired pneumonia) - The patient will be admitted to a medical telemetry bed. - Will continue antibiotic therapy with IV Rocephin and Zithromax. - Mucolytic therapy be provided as well as duo nebs q.i.d. and q.4 hours p.r.n. - We will follow blood cultures.   Acute on chronic blood loss anemia - This is secondary to hematuria. - The patient was typed and crossmatched will be transfused units of packed red blood cells.  Will follow posttransfusion H&H as well as serial levels. - He may benefit from urology consult can be obtained in AM if he continues to have hematuria.  Essential hypertension - We will continue his antihypertensives.  Gout - We will continue allopurinol.  Dyslipidemia - We will continue Statin therapy.   DVT prophylaxis: SCDs Advanced Care Planning:  Code Status: full code.  Family Communication:  The plan of care was discussed in details with the patient (and family). I answered all questions. The patient agreed to proceed with the above mentioned plan. Further management will depend upon hospital course. Disposition Plan: Back to previous home environment Consults called: none.  All the records are reviewed and case discussed with ED provider.  Status is: Inpatient   At the time of the admission, it appears that the appropriate admission status for this patient is inpatient.  This is judged to be reasonable and necessary in order to provide the required intensity of service to ensure the patient's safety given the presenting symptoms, physical exam findings and initial radiographic and laboratory data in the context of comorbid conditions.  The patient requires inpatient status due to high intensity of service, high risk of further  deterioration and high frequency of surveillance required.  I certify that at the time of admission, it is my clinical judgment that the patient will require inpatient hospital care extending more than 2 midnights.                            Dispo: The patient is from: Home              Anticipated d/c is to: Home              Patient currently is not medically stable to d/c.              Difficult to place patient: No  Christel Mormon M.D on 04/08/2022 at 5:35 AM  [facility]   From 7 PM-7 AM, contact night-coverage [email or web address]  CC: Primary care physician; [facility]

## 2022-04-08 NOTE — Progress Notes (Addendum)
No charge progress note.  Troy Berry. is a 77 y.o. African-American male with medical history significant for stage III CKD, GERD, hypertension anemia, and dementia who presented to the emergency room with acute onset of hematuria in the setting of a suprapubic catheter along with blood around the stools per his wife discovered in the diaper.  He has a history of ongoing gross hematuria.  No melena was reported.  No nausea or vomiting.  ED course.  On arrival stable vitals, respiratory panel negative.  Pertinent labs with hyponatremia with sodium of 115, do not from 132 on 54/65/0354, metabolic acidosis with bicarb of 16 and chloride of 91, worsening renal function with BUN 70 and creatinine of 4.04 as compared to 49/3.97 on 12/25.  Lactic acid at 2>> 1.5.  CBC with hemoglobin 5, it was 8 on 12/25. EKG is sinus rhythm, prolonged PR interval and chronic Q waves inferiorly. CT chest, abdomen and pelvis with 1. Cardiomegaly with small bilateral pleural effusions. Small heterogeneous foci of ground-glass density in the left upper lobe and left lower lobe suspicious for pneumonia. 2. Increased density in the region of left renal collecting system and within the left ureter which appears dilated. Findings could be secondary to hemorrhage or infection. There are small left kidney stones. 3. Suspicion of hyperdense renal lesions superior pole left kidney, further assessment difficult without contrast, correlation with renal ultrasound could be attempted. 4. Small volume perihepatic ascites. Generalized mesenteric and subcutaneous edema suggesting anasarca. 5. Enlarged prostate with mass effect on the bladder. 6. Redemonstrated enlarged appearing left retroperitoneal lymph nodes which are either reactive or neoplastic in etiology. 7. Large fluid collection within right groin hernia. 8. Gallstones. 9. Aortic atherosclerosis.  Patient received IV fluid with normal saline, Rocephin and Zithromax for  concern of And blood transfusions were ordered.  1/2: Vitals stable, sodium improved to 118.  TSH within normal limit.  Renal function with some improvement to 61/3.66.  Urology was consulted.  Hemoglobin increased to 8.2 after getting 2 unit of PRBC.  Patient clinically appears dry to euvolemic but BNP at 1834 , markedly elevated from prior check of 395 65-monthago.  No history of heart failure or prior echocardiogram.  Echocardiogram ordered. Sodium slowly increasing, nephrology was also consulted as we might not be able to give him a lot of fluid for concern of heart failure with elevated BNP. CT abdomen for concern of anasarca, holding further IV fluid.  I will let nephrology decide about diuretic as his renal function did show some improvement with IV fluid.  On exam lethargic elderly man, lungs with decreased breath sound at bases and no lower extremity edema.  -Need follow-up on echocardiogram -Follow-up nephrology and urology recommendation -Continue antibiotics -check procalcitonin -Monitor sodium  Addendum.Per urology concern of renal mass/urothelial carcinoma but he is not a candidate for any intervention based on poor functional status.  They are recommending palliative care consult which was ordered..Marland Kitchen

## 2022-04-08 NOTE — Progress Notes (Signed)
Anticoagulation monitoring(Lovenox):  77 yo male ordered Lovenox 40 mg Q24h    Filed Weights   04/08/22 0228  Weight: 67.1 kg (148 lb)   BMI 22.5   Lab Results  Component Value Date   CREATININE 4.04 (H) 04/07/2022   CREATININE 3.97 (H) 03/31/2022   CREATININE 4.04 (H) 03/31/2022   Estimated Creatinine Clearance: 14.5 mL/min (A) (by C-G formula based on SCr of 4.04 mg/dL (H)). Hemoglobin & Hematocrit     Component Value Date/Time   HGB 5.0 (L) 04/07/2022 2258   HGB 8.3 (L) 11/24/2017 1040   HCT 15.4 (L) 04/07/2022 2258   HCT 26.3 (L) 11/24/2017 1040     Per Protocol for Patient with estCrcl < 15 ml/min and BMI < 30, will transition to Heparin 5000 units SQ Q8H .

## 2022-04-08 NOTE — ED Notes (Signed)
PRBC #2 titrated to 136m/hr to infuse over 2hrs. Will continue to monitor.

## 2022-04-08 NOTE — Assessment & Plan Note (Addendum)
Patient was transfused 4 units of packed red blood cells during the hospital course.  2 doses of IV Venofer on 1/3 and 1/5.  Family was interested in another transfusion today for hemoglobin of 7.4.

## 2022-04-08 NOTE — Hospital Course (Addendum)
Taken from H&P.  Troy Berry. is a 77 y.o. African-American male with medical history significant for stage III CKD, GERD, hypertension anemia, and dementia who presented to the emergency room with acute onset of hematuria in the setting of a suprapubic catheter along with blood around the stools per his wife discovered in the diaper.  He has a history of ongoing gross hematuria.  No melena was reported.  No nausea or vomiting.  ED course.  On arrival stable vitals, respiratory panel negative.  Pertinent labs with hyponatremia with sodium of 115, do not from 132 on 20/94/7096, metabolic acidosis with bicarb of 16 and chloride of 91, worsening renal function with BUN 70 and creatinine of 4.04 as compared to 49/3.97 on 12/25.  Lactic acid at 2>> 1.5.  CBC with hemoglobin 5, it was 8 on 12/25. EKG is sinus rhythm, prolonged PR interval and chronic Q waves inferiorly. CT chest, abdomen and pelvis with 1. Cardiomegaly with small bilateral pleural effusions. Small heterogeneous foci of ground-glass density in the left upper lobe and left lower lobe suspicious for pneumonia. 2. Increased density in the region of left renal collecting system and within the left ureter which appears dilated. Findings could be secondary to hemorrhage or infection. There are small left kidney stones. 3. Suspicion of hyperdense renal lesions superior pole left kidney, further assessment difficult without contrast, correlation with renal ultrasound could be attempted. 4. Small volume perihepatic ascites. Generalized mesenteric and subcutaneous edema suggesting anasarca. 5. Enlarged prostate with mass effect on the bladder. 6. Redemonstrated enlarged appearing left retroperitoneal lymph nodes which are either reactive or neoplastic in etiology. 7. Large fluid collection within right groin hernia. 8. Gallstones. 9. Aortic atherosclerosis.  Patient received IV fluid with normal saline, Rocephin and Zithromax for  concern of And blood transfusions were ordered.  1/2: Vitals stable, sodium improved to 118.  TSH within normal limit.  Renal function with some improvement to 61/3.66.  Urology was consulted.  Hemoglobin increased to 8.2 after getting 2 unit of PRBC.  Patient clinically appears dry to euvolemic but BNP at 1834 , markedly elevated from prior check of 395 60-monthago.  No history of heart failure or prior echocardiogram.  Echocardiogram showed normal EF. 1/3.  IV iron given.  Hypertonic saline started for hyponatremia.  Sodium up to 121 1/4.  Seen by palliative care 1/5.  Hemoglobin 7.0.  Transfusion of 1 unit of packed red blood cells and IV iron today.  Urology ordered an MRI of the abdomen for further evaluation 1/6.  MRI of the abdomen showing left kidney mass going down into the ureter, possible liver metastases, retroperitoneal adenopathy and possible bone metastases.  Hemoglobin up to 7.7. 1/7.  Hemoglobin down to 7.2.  Still with gross hematuria.  Will transfuse 1 unit of packed red blood cells.   1/8.  Long discussion with son-in-law and wife at the bedside.  Overall prognosis poor with metastatic cancer.  Patient will continue to bleed no matter what we do.  Not a candidate for treatment with poor functional status.  Family has decided on hospice home.

## 2022-04-09 ENCOUNTER — Other Ambulatory Visit: Payer: Self-pay

## 2022-04-09 ENCOUNTER — Inpatient Hospital Stay (HOSPITAL_COMMUNITY)
Admit: 2022-04-09 | Discharge: 2022-04-09 | Disposition: A | Discharge status: Home / Self Care | Payer: Medicare HMO | Attending: Internal Medicine | Admitting: Internal Medicine

## 2022-04-09 DIAGNOSIS — R9431 Abnormal electrocardiogram [ECG] [EKG]: Secondary | ICD-10-CM | POA: Diagnosis not present

## 2022-04-09 DIAGNOSIS — R339 Retention of urine, unspecified: Secondary | ICD-10-CM

## 2022-04-09 DIAGNOSIS — D62 Acute posthemorrhagic anemia: Secondary | ICD-10-CM | POA: Diagnosis not present

## 2022-04-09 DIAGNOSIS — N184 Chronic kidney disease, stage 4 (severe): Secondary | ICD-10-CM

## 2022-04-09 DIAGNOSIS — R310 Gross hematuria: Secondary | ICD-10-CM | POA: Diagnosis not present

## 2022-04-09 DIAGNOSIS — D500 Iron deficiency anemia secondary to blood loss (chronic): Secondary | ICD-10-CM

## 2022-04-09 DIAGNOSIS — R93422 Abnormal radiologic findings on diagnostic imaging of left kidney: Secondary | ICD-10-CM | POA: Diagnosis not present

## 2022-04-09 DIAGNOSIS — I10 Essential (primary) hypertension: Secondary | ICD-10-CM | POA: Diagnosis not present

## 2022-04-09 DIAGNOSIS — J189 Pneumonia, unspecified organism: Secondary | ICD-10-CM | POA: Diagnosis not present

## 2022-04-09 DIAGNOSIS — N3001 Acute cystitis with hematuria: Secondary | ICD-10-CM | POA: Insufficient documentation

## 2022-04-09 DIAGNOSIS — E871 Hypo-osmolality and hyponatremia: Secondary | ICD-10-CM | POA: Diagnosis not present

## 2022-04-09 LAB — TYPE AND SCREEN
ABO/RH(D): A POS
Antibody Screen: NEGATIVE
Unit division: 0
Unit division: 0

## 2022-04-09 LAB — BPAM RBC
Blood Product Expiration Date: 202401272359
Blood Product Expiration Date: 202401272359
ISSUE DATE / TIME: 202401020153
ISSUE DATE / TIME: 202401020456
Unit Type and Rh: 6200
Unit Type and Rh: 6200

## 2022-04-09 LAB — SODIUM
Sodium: 123 mmol/L — ABNORMAL LOW (ref 135–145)
Sodium: 121 mmol/L — ABNORMAL LOW (ref 135–145)

## 2022-04-09 LAB — CBC WITH DIFFERENTIAL/PLATELET
Abs Immature Granulocytes: 0.13 10*3/uL — ABNORMAL HIGH (ref 0.00–0.07)
Basophils Absolute: 0 10*3/uL (ref 0.0–0.1)
Basophils Relative: 0 %
Eosinophils Absolute: 0.1 10*3/uL (ref 0.0–0.5)
Eosinophils Relative: 1 %
HCT: 23.5 % — ABNORMAL LOW (ref 39.0–52.0)
Hemoglobin: 7.9 g/dL — ABNORMAL LOW (ref 13.0–17.0)
Immature Granulocytes: 2 %
Lymphocytes Relative: 5 %
Lymphs Abs: 0.5 10*3/uL — ABNORMAL LOW (ref 0.7–4.0)
MCH: 29.3 pg (ref 26.0–34.0)
MCHC: 33.6 g/dL (ref 30.0–36.0)
MCV: 87 fL (ref 80.0–100.0)
Monocytes Absolute: 1.1 10*3/uL — ABNORMAL HIGH (ref 0.1–1.0)
Monocytes Relative: 13 %
Neutro Abs: 7.1 10*3/uL (ref 1.7–7.7)
Neutrophils Relative %: 79 %
Platelets: 134 10*3/uL — ABNORMAL LOW (ref 150–400)
RBC: 2.7 MIL/uL — ABNORMAL LOW (ref 4.22–5.81)
RDW: 16.9 % — ABNORMAL HIGH (ref 11.5–15.5)
WBC: 8.9 10*3/uL (ref 4.0–10.5)
nRBC: 0 % (ref 0.0–0.2)

## 2022-04-09 LAB — BASIC METABOLIC PANEL
Anion gap: 6 (ref 5–15)
BUN: 66 mg/dL — ABNORMAL HIGH (ref 8–23)
CO2: 16 mmol/L — ABNORMAL LOW (ref 22–32)
Calcium: 8.9 mg/dL (ref 8.9–10.3)
Chloride: 99 mmol/L (ref 98–111)
Creatinine, Ser: 3.58 mg/dL — ABNORMAL HIGH (ref 0.61–1.24)
GFR, Estimated: 17 mL/min — ABNORMAL LOW (ref >60)
Glucose, Bld: 83 mg/dL (ref 70–99)
Potassium: 4.8 mmol/L (ref 3.5–5.1)
Sodium: 121 mmol/L — ABNORMAL LOW (ref 135–145)

## 2022-04-09 LAB — ECHOCARDIOGRAM COMPLETE
AR max vel: 3.52 cm2
AV Area VTI: 2.68 cm2
AV Area mean vel: 2.98 cm2
AV Mean grad: 4 mmHg
AV Peak grad: 6.7 mmHg
Ao pk vel: 1.29 m/s
Area-P 1/2: 2.7 cm2
Height: 68 in
P 1/2 time: 433 msec
S' Lateral: 3.5 cm
Weight: 2368 oz

## 2022-04-09 LAB — OSMOLALITY: Osmolality: 278 mOsm/kg (ref 275–295)

## 2022-04-09 MED ORDER — SODIUM CHLORIDE 3 % IV SOLN
INTRAVENOUS | Status: DC
  Filled 2022-04-09 (×4): qty 500

## 2022-04-09 MED ORDER — CARVEDILOL 6.25 MG PO TABS: 3.1250 mg | ORAL_TABLET | Freq: Two times a day (BID) | ORAL | Status: DC

## 2022-04-09 MED ORDER — SODIUM CHLORIDE 0.9 % IV SOLN
300.0000 mg | Freq: Once | INTRAVENOUS | Status: AC
  Administered 2022-04-09: 300 mg via INTRAVENOUS
  Filled 2022-04-09: qty 300

## 2022-04-09 MED ORDER — ENSURE ENLIVE PO LIQD
237.0000 mL | Freq: Three times a day (TID) | ORAL | Status: DC
  Administered 2022-04-09 – 2022-04-14 (×13): 237 mL via ORAL

## 2022-04-09 NOTE — Consult Note (Signed)
Pharmacy Consulted for monitoring of Hypertonic Saline  MIVF:  NaCL 3% @ 32m/h (pending start 1/3 @ 1515)  Goal of Therapy:  Goal of correction 818ml per 24 hours IF sodium rises > 4 mEq/L over 2 hours   > 6 mEq/L over 4 hours - Pharmacy will evaluate and contact MD per consult.  Plan:  Presenting with sodium of 121  MIVF NS stopped; NaCL 3% started 1/3 @ 1648 Na: 121 > CTM labs q2h x2; then q4h thereafter.

## 2022-04-09 NOTE — Assessment & Plan Note (Addendum)
Last creatinine 3.32 with a GFR of 18

## 2022-04-09 NOTE — ED Notes (Addendum)
Large amount of bloody urine leaking around foley site at this time. NP at bedside when noticed and is aware. This RN irrigated foley with moderate to large amount of blood clots noted. Unable to fully clear blood clots at this time, but after irrigation noted to have much less leaking around foley site. NP states urology to see patient today.

## 2022-04-09 NOTE — Assessment & Plan Note (Addendum)
Catheter related.  Klebsiella growing out of urine culture.  Sensitive to Rocephin.  Secondary to patient having a catheter, will have to give a longer course of antibiotics.  Switched Rocephin over to Keflex on 1/6.

## 2022-04-09 NOTE — Progress Notes (Signed)
[facility]  ROUNDING NOTE   Subjective:   Troy Hickle. is a 77 y.o. male with multiple medical problems including atonic bladder status post suprapubic catheter, chronic kidney disease, anemia, hypertension, gout presented to the emergency room with coffee ground emesis and hematuria.  Patient has been admitted for CAP (community acquired pneumonia) [J18.9]  Patient is known to our practice from previous admissions.  Patient was followed in our office but appears to have been lost to follow-up.  Last office visit was February 2022.  Patient is seen resting on bed, very poor historian.  Chart review used to obtain history.  Patient is from home and his wife states he began having bloody output from his suprapubic catheter along with stools in his diaper.  No family at bedside.  Patient currently sitting up on stretcher, nurse reports increased leakage from around suprapubic catheter.  Labs on ED arrival significant for sodium 115, serum bicarb 16, BUN 70, creatinine 4.04 with GFR 15 albumin 2.1 lactic acid 2.0, and hemoglobin 5.0.Negative for COVID-19, influenza, or RSV.  UA appears turbid with bacteria.  Urine culture positive for Klebsiella.  Chest x-ray consistent with CHF.  CT chest/abdomen/pelvis shows a mild atrophic right kidney, bilateral renal cyst and small left kidney stones.  We have been consulted to assist with management of hyponatremia.   Objective:  Vital signs in last 24 hours:  Temp:  [98 F (36.7 C)-98.8 F (37.1 C)] 98.8 F (37.1 C) (01/03 0600) Pulse Rate:  [51-114] 52 (01/03 1200) Resp:  [8-22] 8 (01/03 1200) BP: (112-143)/(52-75) 112/52 (01/03 1200) SpO2:  [85 %-100 %] 100 % (01/03 1200)  Weight change:  Filed Weights   04/08/22 0228  Weight: 67.1 kg    Intake/Output: I/O last 3 completed shifts: In: 1350 [I.V.:1000; IV Piggyback:350] Out: 250 [Urine:250]   Intake/Output this shift:  Total I/O In: -  Out: 1000  [Urine:1000]  Physical Exam: General: NAD  Head: Normocephalic, atraumatic. Moist oral mucosal membranes  Eyes: Anicteric  Lungs:  Diminished, normal effort, room air  Heart: Regular rate and rhythm  Abdomen:  Soft, nontender, suprapubic catheter  Extremities: No peripheral edema.  Neurologic: Nonfocal, moving all four extremities  Skin: No lesions  Access: None    Basic Metabolic Panel: Recent Labs  Lab 04/07/22 2258 04/08/22 0512 04/08/22 0933 04/08/22 1613 04/09/22 1006  NA 115* 118* 119* 119* 121*  K 4.7 4.8  --   --  4.8  CL 91* 95*  --   --  99  CO2 16* 15*  --   --  16*  GLUCOSE 133* 118*  --   --  83  BUN 70* 61*  --   --  66*  CREATININE 4.04* 3.66*  --   --  3.58*  CALCIUM 8.9 8.6*  --   --  8.9    Liver Function Tests: Recent Labs  Lab 04/07/22 2258  AST 41  ALT 31  ALKPHOS 253*  BILITOT 0.6  PROT 5.2*  ALBUMIN 2.1*   No results for input(s): "LIPASE", "AMYLASE" in the last 168 hours. No results for input(s): "AMMONIA" in the last 168 hours.  CBC: Recent Labs  Lab 04/07/22 2258 04/08/22 0933 04/09/22 1316  WBC 8.7 9.6 8.9  NEUTROABS  --   --  7.1  HGB 5.0* 8.2* 7.9*  HCT 15.4* 24.3* 23.5*  MCV 87.0 86.8 87.0  PLT 118* 125* 134*    Cardiac Enzymes: No results for input(s): "CKTOTAL", "CKMB", "CKMBINDEX", "  TROPONINI" in the last 168 hours.  BNP: Invalid input(s): "POCBNP"  CBG: No results for input(s): "GLUCAP" in the last 168 hours.  Microbiology: Results for orders placed or performed during the hospital encounter of 04/07/22  Resp panel by RT-PCR (RSV, Flu A&B, Covid) Anterior Nasal Swab     Status: None   Collection Time: 04/07/22 10:58 PM   Specimen: Anterior Nasal Swab  Result Value Ref Range Status   SARS Coronavirus 2 by RT PCR NEGATIVE NEGATIVE Final    Comment: (NOTE) SARS-CoV-2 target nucleic acids are NOT DETECTED.  The SARS-CoV-2 RNA is generally detectable in upper respiratory specimens during the acute phase of  infection. The lowest concentration of SARS-CoV-2 viral copies this assay can detect is 138 copies/mL. A negative result does not preclude SARS-Cov-2 infection and should not be used as the sole basis for treatment or other patient management decisions. A negative result may occur with  improper specimen collection/handling, submission of specimen other than nasopharyngeal swab, presence of viral mutation(s) within the areas targeted by this assay, and inadequate number of viral copies(<138 copies/mL). A negative result must be combined with clinical observations, patient history, and epidemiological information. The expected result is Negative.  Fact Sheet for Patients:  EntrepreneurPulse.com.au  Fact Sheet for Healthcare Providers:  IncredibleEmployment.be  This test is no t yet approved or cleared by the [place] FDA and  has been authorized for detection and/or diagnosis of SARS-CoV-2 by FDA under an Emergency Use Authorization (EUA). This EUA will remain  in effect (meaning this test can be used) for the duration of the COVID-19 declaration under Section 564(b)(1) of the Act, 21 U.S.C.section 360bbb-3(b)(1), unless the authorization is terminated  or revoked sooner.       Influenza A by PCR NEGATIVE NEGATIVE Final   Influenza B by PCR NEGATIVE NEGATIVE Final    Comment: (NOTE) The Xpert Xpress SARS-CoV-2/FLU/RSV plus assay is intended as an aid in the diagnosis of influenza from Nasopharyngeal swab specimens and should not be used as a sole basis for treatment. Nasal washings and aspirates are unacceptable for Xpert Xpress SARS-CoV-2/FLU/RSV testing.  Fact Sheet for Patients: EntrepreneurPulse.com.au  Fact Sheet for Healthcare Providers: IncredibleEmployment.be  This test is not yet approved or cleared by the [place] FDA and has been authorized for detection and/or diagnosis of SARS-CoV-2  by FDA under an Emergency Use Authorization (EUA). This EUA will remain in effect (meaning this test can be used) for the duration of the COVID-19 declaration under Section 564(b)(1) of the Act, 21 U.S.C. section 360bbb-3(b)(1), unless the authorization is terminated or revoked.     Resp Syncytial Virus by PCR NEGATIVE NEGATIVE Final    Comment: (NOTE) Fact Sheet for Patients: EntrepreneurPulse.com.au  Fact Sheet for Healthcare Providers: IncredibleEmployment.be  This test is not yet approved or cleared by the [place] FDA and has been authorized for detection and/or diagnosis of SARS-CoV-2 by FDA under an Emergency Use Authorization (EUA). This EUA will remain in effect (meaning this test can be used) for the duration of the COVID-19 declaration under Section 564(b)(1) of the Act, 21 U.S.C. section 360bbb-3(b)(1), unless the authorization is terminated or revoked.  Performed at [facility], [address], [place], [place] [address]   Urine Culture     Status: Abnormal (Preliminary result)   Collection Time: 04/07/22 11:36 PM   Specimen: Urine, Clean Catch  Result Value Ref Range Status   Specimen Description   Final    URINE, CLEAN CATCH Performed  at [facility], [address] [place], [place] [address]    Special Requests   Final    NONE Performed at [facility], [address] [place], [place] [address]    Culture (A)  Final    >=100,000 COLONIES/mL KLEBSIELLA PNEUMONIAE SUSCEPTIBILITIES TO FOLLOW Performed at [facility], [place] [address], [place], [place] [address]    Report Status PENDING  Incomplete    Coagulation Studies: No results for input(s): "LABPROT", "INR" in the last 72 hours.  Urinalysis: Recent Labs    04/07/22 2336  COLORURINE RED*  LABSPEC 1.019  PHURINE TEST NOT REPORTED DUE TO COLOR INTERFERENCE OF URINE PIGMENT  GLUCOSEU TEST NOT REPORTED DUE TO COLOR  INTERFERENCE OF URINE PIGMENT*  HGBUR TEST NOT REPORTED DUE TO COLOR INTERFERENCE OF URINE PIGMENT*  BILIRUBINUR TEST NOT REPORTED DUE TO COLOR INTERFERENCE OF URINE PIGMENT*  KETONESUR TEST NOT REPORTED DUE TO COLOR INTERFERENCE OF URINE PIGMENT*  PROTEINUR TEST NOT REPORTED DUE TO COLOR INTERFERENCE OF URINE PIGMENT*  NITRITE TEST NOT REPORTED DUE TO COLOR INTERFERENCE OF URINE PIGMENT*  LEUKOCYTESUR TEST NOT REPORTED DUE TO COLOR INTERFERENCE OF URINE PIGMENT*      Imaging: ECHOCARDIOGRAM COMPLETE  Result Date: 04/09/2022    ECHOCARDIOGRAM REPORT   Patient Name:   Troy Sporer. Date of Exam: 04/09/2022 Medical Rec #:  [id number]            Height:       68.0 in Accession #:    [id number]           Weight:       148.0 lb Date of Birth:  1945-07-01            BSA:          1.798 m Patient Age:    78 years             BP:           126/52 mmHg Patient Gender: M                    HR:           52 bpm. Exam Location:  [facility] Procedure: 2D Echo, Cardiac Doppler and Color Doppler Indications:     Abnormal ECG R94.31  History:         Patient has no prior history of Echocardiogram examinations.                  Risk Factors:Hypertension. CKD.  Sonographer:     Sherrie Sport Referring Phys:  [id number] [facility] AMIN Diagnosing Phys: Ida Rogue MD  Sonographer Comments: Suboptimal parasternal window. IMPRESSIONS  1. Challenging images  2. Left ventricular ejection fraction, by estimation, is 55 to 60%. The left ventricle has normal function. The left ventricle has no regional wall motion abnormalities. There is mild left ventricular hypertrophy, unable to exclude moderate hypertrophy of the inferior and lateral wall. Left ventricular diastolic parameters are consistent with Grade I diastolic dysfunction (impaired relaxation).  3. Right ventricular systolic function is mildly reduced, though not well visualized. The right ventricular size is normal. There is normal pulmonary artery systolic pressure. The  estimated right ventricular systolic pressure is 99.3 mmHg.  4. Left atrial size was mild to moderately dilated.  5. The mitral valve is normal in structure. Moderate mitral valve regurgitation. No evidence of mitral stenosis.  6. The aortic valve is normal in structure. Aortic valve regurgitation is moderate  to severe. No aortic stenosis is present.  7. There is severe dilatation of the aortic root, measuring 49 mm.Ascending aorta not well visulized.  8. The inferior vena cava is normal in size with greater than 50% respiratory variability, suggesting right atrial pressure of 3 mmHg. FINDINGS  Left Ventricle: Left ventricular ejection fraction, by estimation, is 55 to 60%. The left ventricle has normal function. The left ventricle has no regional wall motion abnormalities. The left ventricular internal cavity size was normal in size. There is  mild left ventricular hypertrophy. Left ventricular diastolic parameters are consistent with Grade I diastolic dysfunction (impaired relaxation). Right Ventricle: The right ventricular size is normal. No increase in right ventricular wall thickness. Right ventricular systolic function is mildly reduced. There is normal pulmonary artery systolic pressure. The tricuspid regurgitant velocity is 2.78 m/s, and with an assumed right atrial pressure of 5 mmHg, the estimated right ventricular systolic pressure is 16.1 mmHg. Left Atrium: Left atrial size was mild to moderately dilated. Right Atrium: Right atrial size was normal in size. Pericardium: There is no evidence of pericardial effusion. Mitral Valve: The mitral valve is normal in structure. There is mild calcification of the mitral valve leaflet(s). Mild mitral annular calcification. Moderate mitral valve regurgitation. No evidence of mitral valve stenosis. Tricuspid Valve: The tricuspid valve is normal in structure. Tricuspid valve regurgitation is not demonstrated. No evidence of tricuspid stenosis. Aortic Valve: The aortic  valve is normal in structure. Aortic valve regurgitation is moderate to severe. Aortic regurgitation PHT measures 433 msec. No aortic stenosis is present. Aortic valve mean gradient measures 4.0 mmHg. Aortic valve peak gradient measures 6.7 mmHg. Aortic valve area, by VTI measures 2.68 cm. Pulmonic Valve: The pulmonic valve was normal in structure. Pulmonic valve regurgitation is not visualized. No evidence of pulmonic stenosis. Aorta: The aortic root is normal in size and structure. There is severe dilatation of the aortic root, measuring 49 mm. Venous: The inferior vena cava is normal in size with greater than 50% respiratory variability, suggesting right atrial pressure of 3 mmHg. IAS/Shunts: No atrial level shunt detected by color flow Doppler.  LEFT VENTRICLE PLAX 2D LVIDd:         4.90 cm   Diastology LVIDs:         3.50 cm   LV e' medial:    5.22 cm/s LV PW:         1.10 cm   LV E/e' medial:  14.9 LV IVS:        1.20 cm   LV e' lateral:   10.80 cm/s LVOT diam:     2.10 cm   LV E/e' lateral: 7.2 LV SV:         81 LV SV Index:   45 LVOT Area:     3.46 cm  RIGHT VENTRICLE RV Basal diam:  2.50 cm RV Mid diam:    3.10 cm RV S prime:     15.90 cm/s TAPSE (M-mode): 1.9 cm LEFT ATRIUM           Index        RIGHT ATRIUM           Index LA diam:      4.10 cm 2.28 cm/m   RA Area:     10.80 cm LA Vol (A4C): 76.2 ml 42.37 ml/m  RA Volume:   15.90 ml  8.84 ml/m  AORTIC VALVE AV Area (Vmax):    3.52 cm AV Area (Vmean):   2.98 cm  AV Area (VTI):     2.68 cm AV Vmax:           129.00 cm/s AV Vmean:          95.200 cm/s AV VTI:            0.301 m AV Peak Grad:      6.7 mmHg AV Mean Grad:      4.0 mmHg LVOT Vmax:         131.00 cm/s LVOT Vmean:        81.800 cm/s LVOT VTI:          0.233 m LVOT/AV VTI ratio: 0.77 AI PHT:            433 msec  AORTA Ao Root diam: 5.07 cm MITRAL VALVE                TRICUSPID VALVE MV Area (PHT): 2.70 cm     TR Peak grad:   30.9 mmHg MV Decel Time: 281 msec     TR Vmax:        278.00  cm/s MV E velocity: 77.60 cm/s MV A velocity: 123.00 cm/s  SHUNTS MV E/A ratio:  0.63         Systemic VTI:  0.23 m                             Systemic Diam: 2.10 cm Ida Rogue MD Electronically signed by Ida Rogue MD Signature Date/Time: 04/09/2022/12:44:09 PM    Final    US RENAL  Result Date: 04/08/2022 CLINICAL DATA:  Hydronephrosis, left.  Renal mass on CT. EXAM: RENAL / URINARY TRACT ULTRASOUND COMPLETE COMPARISON:  Noncontrast abdominal CT 04/07/2022 and 03/05/2022. Renal ultrasound 12/21/2017. FINDINGS: Right Kidney: Renal measurements: 10.1 x 4.0 x 4.7 cm = volume: 99.1 mL. Cortical thinning with increased cortical echogenicity. Small renal cysts measuring up to 1.8 cm in diameter. No hydronephrosis. Left Kidney: Renal measurements: 12.3 x 6.1 x 5.7 cm = volume: 221.4 mL. Multiple cysts are noted, measuring up to 4.2 cm in the upper pole. No discrete mass is seen to correspond with the abnormality on recent CT. Bladder: Suprapubic catheter in place. The urinary bladder is mildly distended. Other: Ascites and a small left pleural effusion are noted. The prostate gland is moderately enlarged. Numerous hypoechoic liver lesions are noted, incompletely characterized. Examination limited by patient condition. IMPRESSION: 1. No discrete left renal mass identified by ultrasound to correspond with the abnormality on recent CT's. CT finding remains indeterminate and could reflect parenchymal hemorrhage or a hemorrhagic mass. Recommend further evaluation with dedicated renal CT or MRI (without and with contrast). 2. Bilateral renal cysts. 3. Right renal cortical thinning and increased cortical echogenicity, nonspecific but can be seen with chronic medical renal disease. 4. Ascites and small left pleural effusion. 5. Numerous hypoechoic liver lesions, incompletely characterized. Electronically Signed   By: Richardean Sale M.D.   On: 04/08/2022 10:33   CT CHEST ABDOMEN PELVIS WO CONTRAST  Result Date:  04/07/2022 CLINICAL DATA:  Cough EXAM: CT CHEST, ABDOMEN AND PELVIS WITHOUT CONTRAST TECHNIQUE: Multidetector CT imaging of the chest, abdomen and pelvis was performed following the standard protocol without IV contrast. RADIATION DOSE REDUCTION: This exam was performed according to the departmental dose-optimization program which includes automated exposure control, adjustment of the mA and/or kV according to patient size and/or use of iterative reconstruction technique. COMPARISON:  Chest x-ray 04/07/2022, CT 03/05/2022 FINDINGS: CT  CHEST FINDINGS Cardiovascular: Limited evaluation without intravenous contrast. Advanced aortic atherosclerosis. 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 Extensive coronary vascular calcifications. Cardiomegaly. No pericardial effusion. Mediastinum/Nodes: Midline trachea. No thyroid mass. Numerous calcified mediastinal and hilar nodes as before. Esophagus within normal limits. Lungs/Pleura: Small bilateral pleural effusions are new. Respiratory motion degradation. Small heterogeneous foci of ground-glass density in the left upper lobe and lower lobe. Musculoskeletal: Sternum is intact. Degenerative changes of the spine. No acute osseous abnormality. CT ABDOMEN PELVIS FINDINGS Hepatobiliary: No focal hepatic abnormality. Gallstones. No biliary dilatation. Pancreas: Unremarkable. No pancreatic ductal dilatation or surrounding inflammatory changes. Spleen: Normal in size without focal abnormality. Adrenals/Urinary Tract: Adrenal glands are within normal limits. Extensive intrarenal vascular calcifications. Mild atrophy of right kidney. Bilateral renal cysts for which no imaging follow-up is recommended. Possible hyperdense lesion at the upper pole of the left kidney, series 2, image 54, limited assessment without contrast and the images are degraded by motion. Increased density in the region of left renal collecting system, series 2, image 64, and within the  left ureter which appears dilated. Small left kidney stones. Suprapubic bladder catheter with small amount of air in the bladder. Stomach/Bowel: The stomach is mildly distended. There is no dilated small bowel. No acute bowel wall thickening. Negative appendix. No acute bowel wall thickening. Vascular/Lymphatic: Advanced aortic atherosclerosis. No aneurysm. Enlarged appearing left retroperitoneal lymph nodes measuring up to 2.4 cm. Reproductive: Prostate is enlarged. Mass effect on the bladder. Large fluid collection in the right groin measuring 5.6 cm. Other: No free air. Small volume perihepatic ascites. Generalized mesenteric and subcutaneous edema. Musculoskeletal: No acute osseous abnormality. IMPRESSION: 1. Cardiomegaly with small bilateral pleural effusions. Small heterogeneous foci of ground-glass density in the left upper lobe and left lower lobe suspicious for pneumonia. 2. Increased density in the region of left renal collecting system and within the left ureter which appears dilated. Findings could be secondary to hemorrhage or infection. There are small left kidney stones. 3. Suspicion of hyperdense renal lesions superior pole left kidney, further assessment difficult without contrast, correlation with renal ultrasound could be attempted. 4. Small volume perihepatic ascites. Generalized mesenteric and subcutaneous edema suggesting anasarca. 5. Enlarged prostate with mass effect on the bladder. 6. Redemonstrated enlarged appearing left retroperitoneal lymph nodes which are either reactive or neoplastic in etiology. 7. Large fluid collection within right groin hernia. 8. Gallstones. 9. Aortic atherosclerosis. Aortic Atherosclerosis (ICD10-I70.0). Electronically Signed   By: Donavan Foil M.D.   On: 04/07/2022 23:51   DG Chest Portable 1 View  Result Date: 04/07/2022 CLINICAL DATA:  Shortness of breath and cough EXAM: PORTABLE CHEST 1 VIEW COMPARISON:  03/17/2022 FINDINGS: Cardiac shadow is enlarged.  Multiple calcified hilar lymph nodes are noted. Thoracic aorta is tortuous. Vascular congestion is seen with mild interstitial edema. Small right pleural effusion is noted. No acute bony abnormality is noted. IMPRESSION: Changes consistent with CHF with small left pleural effusion. Electronically Signed   By: Inez Catalina M.D.   On: 04/07/2022 23:15     Medications:    sodium chloride Stopped (04/08/22 0054)   sodium chloride Stopped (04/09/22 1610)   azithromycin Stopped (04/09/22 0315)   cefTRIAXone (ROCEPHIN)  IV Stopped (04/09/22 0130)    allopurinol  300 mg Oral Daily   amLODipine  10 mg Oral Daily   calcium-vitamin D  1 tablet Oral Daily   carvedilol  25 mg Oral BID WC   cholecalciferol  1,000 Units Oral Daily  ferrous sulfate  325 mg Oral Daily   finasteride  5 mg Oral Daily   pravastatin  40 mg Oral q1800   acetaminophen **OR** acetaminophen, magnesium hydroxide, ondansetron **OR** ondansetron (ZOFRAN) IV, polyethylene glycol, traZODone  Assessment/ Plan:  Mr. Troy Nedved. is a 77 y.o.  male with multiple medical problems including atonic bladder status post suprapubic catheter, chronic kidney disease, anemia, hypertension, gout presented to the emergency room with hematuria and abdominal distention.  Severe hyponatremia, sodium 115 on ED arrival.  Patient initially receiving normal saline infusion with minimal improvement.Will order 3% hypertonic saline at 22m/hr. Will continue to monitor sodium frequently   2. Anemia with acute blood loss:   Lab Results  Component Value Date   HGB 7.9 (L) 04/09/2022    Hgb has improved since admission. Patient has received 2 unit blood transfusion. Urology consulted for hematuria and suprapubic catheter management.   3. Chronic kidney disease stage 4. Remains at baseline creatinine. Adequate urine output recorded. Will continue to monitor.    LOS: 1[place]1/3/20241:31 PM

## 2022-04-09 NOTE — Progress Notes (Signed)
Urology Consult Follow Up  Subjective: He is asleep.  He is unaccompanied in the room at this time and he is unable to contribute to HPI.  SPT in place draining maroon-colored urine.  Hemoglobin at 7.9 (baseline at 8.0).  Serum creatinine at 3.58.  Blood in her urine culture results for Klebsiella.  Urine cytology has not yet been collected.  Anti-infectives: Anti-infectives (From admission, onward)    Start     Dose/Rate Route Frequency Ordered Stop   04/09/22 0000  cefTRIAXone (ROCEPHIN) 2 g in sodium chloride 0.9 % 100 mL IVPB        2 g 200 mL/hr over 30 Minutes Intravenous Every 24 hours 04/08/22 0052 04/12/22 2359   04/09/22 0000  azithromycin (ZITHROMAX) 500 mg in sodium chloride 0.9 % 250 mL IVPB        500 mg 250 mL/hr over 60 Minutes Intravenous Every 24 hours 04/08/22 0052 04/12/22 2359   04/08/22 0230  cefTRIAXone (ROCEPHIN) 1 g in sodium chloride 0.9 % 100 mL IVPB        1 g 200 mL/hr over 30 Minutes Intravenous  Once 04/08/22 0226 04/08/22 0452   04/08/22 0000  cefTRIAXone (ROCEPHIN) 1 g in sodium chloride 0.9 % 100 mL IVPB        1 g 200 mL/hr over 30 Minutes Intravenous  Once 04/07/22 2358 04/08/22 0044   04/08/22 0000  azithromycin (ZITHROMAX) 500 mg in sodium chloride 0.9 % 250 mL IVPB        500 mg 250 mL/hr over 60 Minutes Intravenous  Once 04/07/22 2358 04/08/22 0156       Current Facility-Administered Medications  Medication Dose Route Frequency Provider Last Rate Last Admin   0.9 %  sodium chloride infusion  10 mL/hr Intravenous Once Harvest Dark, MD   Held at 04/08/22 0054   0.9 %  sodium chloride infusion   Intravenous Continuous Lorella Nimrod, MD   Stopped at 04/09/22 [phone]   acetaminophen (TYLENOL) tablet 650 mg  650 mg Oral Q6H PRN Mansy, Jan A, MD       Or   acetaminophen (TYLENOL) suppository 650 mg  650 mg Rectal Q6H PRN Mansy, Jan A, MD       allopurinol (ZYLOPRIM) tablet 300 mg  300 mg Oral Daily Mansy, Jan A, MD   300 mg at 04/09/22 1008    amLODipine (NORVASC) tablet 10 mg  10 mg Oral Daily Mansy, Jan A, MD   10 mg at 04/09/22 1007   azithromycin (ZITHROMAX) 500 mg in sodium chloride 0.9 % 250 mL IVPB  500 mg Intravenous Q24H Mansy, Jan A, MD   Stopped at 04/09/22 0315   calcium-vitamin D (OSCAL WITH D) 500-5 MG-MCG per tablet 1 tablet  1 tablet Oral Daily Mansy, Jan A, MD   1 tablet at 04/09/22 1007   carvedilol (COREG) tablet 25 mg  25 mg Oral BID WC Mansy, Jan A, MD   25 mg at 04/09/22 1007   cefTRIAXone (ROCEPHIN) 2 g in sodium chloride 0.9 % 100 mL IVPB  2 g Intravenous Q24H Mansy, Jan A, MD   Stopped at 04/09/22 0130   cholecalciferol (VITAMIN D3) 25 MCG (1000 UNIT) tablet 1,000 Units  1,000 Units Oral Daily Mansy, Jan A, MD   1,000 Units at 04/09/22 1008   ferrous sulfate tablet 325 mg  325 mg Oral Daily Mansy, Jan A, MD   325 mg at 04/09/22 1008   finasteride (PROSCAR) tablet 5 mg  5 mg  Oral Daily Mansy, Jan A, MD   5 mg at 04/09/22 1008   magnesium hydroxide (MILK OF MAGNESIA) suspension 30 mL  30 mL Oral Daily PRN Mansy, Jan A, MD       ondansetron [facility]) tablet 4 mg  4 mg Oral Q6H PRN Mansy, Jan A, MD       Or   ondansetron [facility]) injection 4 mg  4 mg Intravenous Q6H PRN Mansy, Jan A, MD       polyethylene glycol (MIRALAX / GLYCOLAX) packet 17 g  17 g Oral Daily PRN Mansy, Jan A, MD       pravastatin (PRAVACHOL) tablet 40 mg  40 mg Oral q1800 Mansy, Jan A, MD       traZODone (DESYREL) tablet 25 mg  25 mg Oral QHS PRN Mansy, Arvella Merles, MD       Current Outpatient Medications  Medication Sig Dispense Refill   allopurinol (ZYLOPRIM) 300 MG tablet Take 300 mg by mouth daily.  5   amLODipine (NORVASC) 10 MG tablet Take 10 mg by mouth daily.     calcium citrate-vitamin D (CITRACAL+D) 315-200 MG-UNIT tablet Take 1 tablet by mouth daily.     carvedilol (COREG) 25 MG tablet Take 25 mg by mouth 2 (two) times daily with a meal.      cholecalciferol (VITAMIN D3) 25 MCG (1000 UT) tablet Take 1,000 Units by mouth daily.      CRANBERRY-CALCIUM PO Take 1 capsule by mouth daily.     FEROSUL 325 (65 Fe) MG tablet Take 325 mg by mouth daily.     finasteride (PROSCAR) 5 MG tablet Take 1 tablet (5 mg total) by mouth daily. 30 tablet 11   furosemide (LASIX) 40 MG tablet Take 40 mg by mouth daily.     lovastatin (MEVACOR) 40 MG tablet Take 1 tablet by mouth every evening.     acetaminophen (TYLENOL) 325 MG tablet Take 2 tablets (650 mg total) by mouth every 6 (six) hours as needed for mild pain (or Fever >/= 101). 10 tablet 0   polyethylene glycol (MIRALAX) 17 g packet Take 17 g by mouth daily. 14 each 0   sulfamethoxazole-trimethoprim (BACTRIM DS) 800-160 MG tablet Take 1 tablet by mouth 2 (two) times daily. (Patient not taking: Reported on 04/07/2022) 14 tablet 0     Objective: Vital signs in last 24 hours: Temp:  [98 F (36.7 C)-98.8 F (37.1 C)] 98.8 F (37.1 C) (01/03 0600) Pulse Rate:  [51-114] 52 (01/03 1200) Resp:  [8-22] 8 (01/03 1200) BP: (112-143)/(52-75) 112/52 (01/03 1200) SpO2:  [85 %-100 %] 100 % (01/03 1200)  Intake/Output from previous day: 01/02 0701 - 01/03 0700 In: 1350 [I.V.:1000; IV Piggyback:350] Out: -  Intake/Output this shift: Total I/O In: -  Out: 1000 [Urine:1000]   Physical Exam Constitutional:      Comments: Frail appearing  HENT:     Head: Normocephalic and atraumatic.  Abdominal:     General: Abdomen is flat.     Comments: SPT in place, site clean and dry  Skin:    General: Skin is warm and dry.  Psychiatric:        Behavior: Behavior normal.    Lab Results:  Recent Labs    04/08/22 0933 04/09/22 1316  WBC 9.6 8.9  HGB 8.2* 7.9*  HCT 24.3* 23.5*  PLT 125* 134*   BMET Recent Labs    04/08/22 0512 04/08/22 0933 04/08/22 1613 04/09/22 1006  NA 118*   < >  119* 121*  K 4.8  --   --  4.8  CL 95*  --   --  99  CO2 15*  --   --  16*  GLUCOSE 118*  --   --  83  BUN 61*  --   --  66*  CREATININE 3.66*  --   --  3.58*  CALCIUM 8.6*  --   --  8.9   < > =  values in this interval not displayed.   PT/INR No results for input(s): "LABPROT", "INR" in the last 72 hours. ABG No results for input(s): "PHART", "HCO3" in the last 72 hours.  Invalid input(s): "PCO2", "PO2"  Studies/Results: ECHOCARDIOGRAM COMPLETE  Result Date: 04/09/2022    ECHOCARDIOGRAM REPORT   Patient Name:   Troy Berry. Date of Exam: 04/09/2022 Medical Rec #:  [id number]            Height:       68.0 in Accession #:    [id number]           Weight:       148.0 lb Date of Birth:  1945/05/21            BSA:          1.798 m Patient Age:    9 years             BP:           126/52 mmHg Patient Gender: M                    HR:           52 bpm. Exam Location:  [facility] Procedure: 2D Echo, Cardiac Doppler and Color Doppler Indications:     Abnormal ECG R94.31  History:         Patient has no prior history of Echocardiogram examinations.                  Risk Factors:Hypertension. CKD.  Sonographer:     Sherrie Sport Referring Phys:  [id number] [facility] AMIN Diagnosing Phys: Ida Rogue MD  Sonographer Comments: Suboptimal parasternal window. IMPRESSIONS  1. Challenging images  2. Left ventricular ejection fraction, by estimation, is 55 to 60%. The left ventricle has normal function. The left ventricle has no regional wall motion abnormalities. There is mild left ventricular hypertrophy, unable to exclude moderate hypertrophy of the inferior and lateral wall. Left ventricular diastolic parameters are consistent with Grade I diastolic dysfunction (impaired relaxation).  3. Right ventricular systolic function is mildly reduced, though not well visualized. The right ventricular size is normal. There is normal pulmonary artery systolic pressure. The estimated right ventricular systolic pressure is 99.2 mmHg.  4. Left atrial size was mild to moderately dilated.  5. The mitral valve is normal in structure. Moderate mitral valve regurgitation. No evidence of mitral stenosis.  6. The aortic valve is normal in  structure. Aortic valve regurgitation is moderate to severe. No aortic stenosis is present.  7. There is severe dilatation of the aortic root, measuring 49 mm.Ascending aorta not well visulized.  8. The inferior vena cava is normal in size with greater than 50% respiratory variability, suggesting right atrial pressure of 3 mmHg. FINDINGS  Left Ventricle: Left ventricular ejection fraction, by estimation, is 55 to 60%. The left ventricle has normal function. The left ventricle has no regional wall motion abnormalities. The left ventricular internal cavity size was normal in size. There is  mild  left ventricular hypertrophy. Left ventricular diastolic parameters are consistent with Grade I diastolic dysfunction (impaired relaxation). Right Ventricle: The right ventricular size is normal. No increase in right ventricular wall thickness. Right ventricular systolic function is mildly reduced. There is normal pulmonary artery systolic pressure. The tricuspid regurgitant velocity is 2.78 m/s, and with an assumed right atrial pressure of 5 mmHg, the estimated right ventricular systolic pressure is 37.6 mmHg. Left Atrium: Left atrial size was mild to moderately dilated. Right Atrium: Right atrial size was normal in size. Pericardium: There is no evidence of pericardial effusion. Mitral Valve: The mitral valve is normal in structure. There is mild calcification of the mitral valve leaflet(s). Mild mitral annular calcification. Moderate mitral valve regurgitation. No evidence of mitral valve stenosis. Tricuspid Valve: The tricuspid valve is normal in structure. Tricuspid valve regurgitation is not demonstrated. No evidence of tricuspid stenosis. Aortic Valve: The aortic valve is normal in structure. Aortic valve regurgitation is moderate to severe. Aortic regurgitation PHT measures 433 msec. No aortic stenosis is present. Aortic valve mean gradient measures 4.0 mmHg. Aortic valve peak gradient measures 6.7 mmHg. Aortic valve  area, by VTI measures 2.68 cm. Pulmonic Valve: The pulmonic valve was normal in structure. Pulmonic valve regurgitation is not visualized. No evidence of pulmonic stenosis. Aorta: The aortic root is normal in size and structure. There is severe dilatation of the aortic root, measuring 49 mm. Venous: The inferior vena cava is normal in size with greater than 50% respiratory variability, suggesting right atrial pressure of 3 mmHg. IAS/Shunts: No atrial level shunt detected by color flow Doppler.  LEFT VENTRICLE PLAX 2D LVIDd:         4.90 cm   Diastology LVIDs:         3.50 cm   LV e' medial:    5.22 cm/s LV PW:         1.10 cm   LV E/e' medial:  14.9 LV IVS:        1.20 cm   LV e' lateral:   10.80 cm/s LVOT diam:     2.10 cm   LV E/e' lateral: 7.2 LV SV:         81 LV SV Index:   45 LVOT Area:     3.46 cm  RIGHT VENTRICLE RV Basal diam:  2.50 cm RV Mid diam:    3.10 cm RV S prime:     15.90 cm/s TAPSE (M-mode): 1.9 cm LEFT ATRIUM           Index        RIGHT ATRIUM           Index LA diam:      4.10 cm 2.28 cm/m   RA Area:     10.80 cm LA Vol (A4C): 76.2 ml 42.37 ml/m  RA Volume:   15.90 ml  8.84 ml/m  AORTIC VALVE AV Area (Vmax):    3.52 cm AV Area (Vmean):   2.98 cm AV Area (VTI):     2.68 cm AV Vmax:           129.00 cm/s AV Vmean:          95.200 cm/s AV VTI:            0.301 m AV Peak Grad:      6.7 mmHg AV Mean Grad:      4.0 mmHg LVOT Vmax:         131.00 cm/s LVOT Vmean:  81.800 cm/s LVOT VTI:          0.233 m LVOT/AV VTI ratio: 0.77 AI PHT:            433 msec  AORTA Ao Root diam: 5.07 cm MITRAL VALVE                TRICUSPID VALVE MV Area (PHT): 2.70 cm     TR Peak grad:   30.9 mmHg MV Decel Time: 281 msec     TR Vmax:        278.00 cm/s MV E velocity: 77.60 cm/s MV A velocity: 123.00 cm/s  SHUNTS MV E/A ratio:  0.63         Systemic VTI:  0.23 m                             Systemic Diam: 2.10 cm Ida Rogue MD Electronically signed by Ida Rogue MD Signature Date/Time:  04/09/2022/12:44:09 PM    Final    US RENAL  Result Date: 04/08/2022 CLINICAL DATA:  Hydronephrosis, left.  Renal mass on CT. EXAM: RENAL / URINARY TRACT ULTRASOUND COMPLETE COMPARISON:  Noncontrast abdominal CT 04/07/2022 and 03/05/2022. Renal ultrasound 12/21/2017. FINDINGS: Right Kidney: Renal measurements: 10.1 x 4.0 x 4.7 cm = volume: 99.1 mL. Cortical thinning with increased cortical echogenicity. Small renal cysts measuring up to 1.8 cm in diameter. No hydronephrosis. Left Kidney: Renal measurements: 12.3 x 6.1 x 5.7 cm = volume: 221.4 mL. Multiple cysts are noted, measuring up to 4.2 cm in the upper pole. No discrete mass is seen to correspond with the abnormality on recent CT. Bladder: Suprapubic catheter in place. The urinary bladder is mildly distended. Other: Ascites and a small left pleural effusion are noted. The prostate gland is moderately enlarged. Numerous hypoechoic liver lesions are noted, incompletely characterized. Examination limited by patient condition. IMPRESSION: 1. No discrete left renal mass identified by ultrasound to correspond with the abnormality on recent CT's. CT finding remains indeterminate and could reflect parenchymal hemorrhage or a hemorrhagic mass. Recommend further evaluation with dedicated renal CT or MRI (without and with contrast). 2. Bilateral renal cysts. 3. Right renal cortical thinning and increased cortical echogenicity, nonspecific but can be seen with chronic medical renal disease. 4. Ascites and small left pleural effusion. 5. Numerous hypoechoic liver lesions, incompletely characterized. Electronically Signed   By: Richardean Sale M.D.   On: 04/08/2022 10:33   CT CHEST ABDOMEN PELVIS WO CONTRAST  Result Date: 04/07/2022 CLINICAL DATA:  Cough EXAM: CT CHEST, ABDOMEN AND PELVIS WITHOUT CONTRAST TECHNIQUE: Multidetector CT imaging of the chest, abdomen and pelvis was performed following the standard protocol without IV contrast. RADIATION DOSE REDUCTION: This  exam was performed according to the departmental dose-optimization program which includes automated exposure control, adjustment of the mA and/or kV according to patient size and/or use of iterative reconstruction technique. COMPARISON:  Chest x-ray 04/07/2022, CT 03/05/2022 FINDINGS: CT CHEST FINDINGS Cardiovascular: Limited evaluation without intravenous contrast. Advanced aortic atherosclerosis. 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 Extensive coronary vascular calcifications. Cardiomegaly. No pericardial effusion. Mediastinum/Nodes: Midline trachea. No thyroid mass. Numerous calcified mediastinal and hilar nodes as before. Esophagus within normal limits. Lungs/Pleura: Small bilateral pleural effusions are new. Respiratory motion degradation. Small heterogeneous foci of ground-glass density in the left upper lobe and lower lobe. Musculoskeletal: Sternum is intact. Degenerative changes of the spine. No acute osseous abnormality. CT ABDOMEN PELVIS FINDINGS Hepatobiliary: No focal  hepatic abnormality. Gallstones. No biliary dilatation. Pancreas: Unremarkable. No pancreatic ductal dilatation or surrounding inflammatory changes. Spleen: Normal in size without focal abnormality. Adrenals/Urinary Tract: Adrenal glands are within normal limits. Extensive intrarenal vascular calcifications. Mild atrophy of right kidney. Bilateral renal cysts for which no imaging follow-up is recommended. Possible hyperdense lesion at the upper pole of the left kidney, series 2, image 54, limited assessment without contrast and the images are degraded by motion. Increased density in the region of left renal collecting system, series 2, image 64, and within the left ureter which appears dilated. Small left kidney stones. Suprapubic bladder catheter with small amount of air in the bladder. Stomach/Bowel: The stomach is mildly distended. There is no dilated small bowel. No acute bowel wall thickening. Negative  appendix. No acute bowel wall thickening. Vascular/Lymphatic: Advanced aortic atherosclerosis. No aneurysm. Enlarged appearing left retroperitoneal lymph nodes measuring up to 2.4 cm. Reproductive: Prostate is enlarged. Mass effect on the bladder. Large fluid collection in the right groin measuring 5.6 cm. Other: No free air. Small volume perihepatic ascites. Generalized mesenteric and subcutaneous edema. Musculoskeletal: No acute osseous abnormality. IMPRESSION: 1. Cardiomegaly with small bilateral pleural effusions. Small heterogeneous foci of ground-glass density in the left upper lobe and left lower lobe suspicious for pneumonia. 2. Increased density in the region of left renal collecting system and within the left ureter which appears dilated. Findings could be secondary to hemorrhage or infection. There are small left kidney stones. 3. Suspicion of hyperdense renal lesions superior pole left kidney, further assessment difficult without contrast, correlation with renal ultrasound could be attempted. 4. Small volume perihepatic ascites. Generalized mesenteric and subcutaneous edema suggesting anasarca. 5. Enlarged prostate with mass effect on the bladder. 6. Redemonstrated enlarged appearing left retroperitoneal lymph nodes which are either reactive or neoplastic in etiology. 7. Large fluid collection within right groin hernia. 8. Gallstones. 9. Aortic atherosclerosis. Aortic Atherosclerosis (ICD10-I70.0). Electronically Signed   By: Donavan Foil M.D.   On: 04/07/2022 23:51   DG Chest Portable 1 View  Result Date: 04/07/2022 CLINICAL DATA:  Shortness of breath and cough EXAM: PORTABLE CHEST 1 VIEW COMPARISON:  03/17/2022 FINDINGS: Cardiac shadow is enlarged. Multiple calcified hilar lymph nodes are noted. Thoracic aorta is tortuous. Vascular congestion is seen with mild interstitial edema. Small right pleural effusion is noted. No acute bony abnormality is noted. IMPRESSION: Changes consistent with CHF with  small left pleural effusion. Electronically Signed   By: Inez Catalina M.D.   On: 04/07/2022 23:15    Patient was cleaned and prepped in a sterile fashion with betadine and 2% lidocaine jelly was instilled into the urethra. A 24 FR RUSCH 3-way foley catheter was inserted, urine return was noted  300 ml, urine was maroon in color.  I irrigated the catheter with 1 L of sterile water with the return of 100 cc large stringy clots.  The urine is now a light clear pink.  The balloon was filled with 30 cc of sterile water.  The SPT is plugged.    Assessment and Plan: 77 year old comorbid male with dementia, chronic urinary retention managed with suprapubic catheter and persistent gross hematuria currently admitted with CAP, hyponatremia, and acute on chronic blood loss anemia, now s/p 2 units PRBC's with recent imaging concerning for a possible neoplasm of the left kidney.  SPT needed to be irrigated this morning with bloody clot return.  It is now unable to be irrigated, so it is likely clotted off.  An urethral catheter is placed.  The concern at this time is that the hematuria is renal in origin versus bladder or prostatic and CBI would be of little help, so we will hold on starting CBI and reassess in the a.m.    Recommendations: -Continue to trend CBC and transfuse as needed -Hyponatremia, CAP management by primary team -irrigate the urethral Foley as needed for clots -Obtain urine cytology -Palliative care consult is pending to define goals of care in this patient as he has had significant functional decline over the past several months and he is a poor surgical candidate, poor candidate for chemotherapy and embolization which would certainly lead to renal failure and he would likely not tolerate dialysis    LOS: 1 day    [facility] 04/09/2022

## 2022-04-09 NOTE — Progress Notes (Signed)
*  PRELIMINARY RESULTS* Echocardiogram 2D Echocardiogram has been performed.  Troy Berry 04/09/2022, 9:22 AM

## 2022-04-09 NOTE — Plan of Care (Signed)
Consult noted for [place]. Called to speak with wife. She states she will not be coming to the hospital tomorrow, but would be available to speak by phone. Discussed talking  further tomorrow morning.

## 2022-04-09 NOTE — Consult Note (Signed)
Pharmacy Consulted for monitoring of Hypertonic Saline  MIVF:  NaCL 3% @ 53m/h (pending start 1/3 @ 1515)  Date Time Na  1/1 2258 115  1/2 0512 118  1/2 1613 119  1/3 1006 121  1/3 1630 Na @ 30 mL/hr started  1/3 1816 123        Goal of Therapy:  Goal of correction 859ml per 24 hours IF sodium rises > 4 mEq/L over 2 hours   > 6 mEq/L over 4 hours - Pharmacy will evaluate and contact MD per consult.  Plan:  Presenting with sodium of 121  MIVF NS stopped; NaCL 3% started 1/3 @ 1648 Na: 121 > CTM labs q2h x2; then q4h thereafter.

## 2022-04-09 NOTE — Progress Notes (Signed)
Progress Note   Patient: Troy Berry. OIN:[id number] DOB: 1945-11-11 DOA: 04/07/2022     1 DOS: the patient was seen and examined on 04/09/2022   Brief hospital course: Taken from H&P.  Troy Berry. is a 77 y.o. African-American male with medical history significant for stage III CKD, GERD, hypertension anemia, and dementia who presented to the emergency room with acute onset of hematuria in the setting of a suprapubic catheter along with blood around the stools per his wife discovered in the diaper.  He has a history of ongoing gross hematuria.  No melena was reported.  No nausea or vomiting.  ED course.  On arrival stable vitals, respiratory panel negative.  Pertinent labs with hyponatremia with sodium of 115, do not from 132 on 70/96/2836, metabolic acidosis with bicarb of 16 and chloride of 91, worsening renal function with BUN 70 and creatinine of 4.04 as compared to 49/3.97 on 12/25.  Lactic acid at 2>> 1.5.  CBC with hemoglobin 5, it was 8 on 12/25. EKG is sinus rhythm, prolonged PR interval and chronic Q waves inferiorly. CT chest, abdomen and pelvis with 1. Cardiomegaly with small bilateral pleural effusions. Small heterogeneous foci of ground-glass density in the left upper lobe and left lower lobe suspicious for pneumonia. 2. Increased density in the region of left renal collecting system and within the left ureter which appears dilated. Findings could be secondary to hemorrhage or infection. There are small left kidney stones. 3. Suspicion of hyperdense renal lesions superior pole left kidney, further assessment difficult without contrast, correlation with renal ultrasound could be attempted. 4. Small volume perihepatic ascites. Generalized mesenteric and subcutaneous edema suggesting anasarca. 5. Enlarged prostate with mass effect on the bladder. 6. Redemonstrated enlarged appearing left retroperitoneal lymph nodes which are either reactive or neoplastic in  etiology. 7. Large fluid collection within right groin hernia. 8. Gallstones. 9. Aortic atherosclerosis.  Patient received IV fluid with normal saline, Rocephin and Zithromax for concern of And blood transfusions were ordered.  1/2: Vitals stable, sodium improved to 118.  TSH within normal limit.  Renal function with some improvement to 61/3.66.  Urology was consulted.  Hemoglobin increased to 8.2 after getting 2 unit of PRBC.  Patient clinically appears dry to euvolemic but BNP at 1834 , markedly elevated from prior check of 395 74-monthago.  No history of heart failure or prior echocardiogram.  Echocardiogram ordered. Sodium slowly increasing, nephrology was also consulted as we might not be able to give him a lot of fluid for concern of heart failure with elevated BNP.  Per urology concern of renal mass/urothelial carcinoma but he is not a candidate for any intervention based on poor functional status.  They are recommending palliative care consult which was ordered..   Assessment and Plan: * Acute on chronic blood loss anemia Patient was transfused 2 units of packed red blood cells and hemoglobin came up to 8.1.  Most recent hemoglobin 7.9.  Will give IV iron.  Likely from hemorrhagic renal cyst versus mass.  CAP (community acquired pneumonia) Started on Rocephin and Zithromax  Hyponatremia Last sodium 121 up from 115.  Careful with IV fluids.  Essential hypertension Hold Coreg with bradycardia.  Acute cystitis with hematuria Klebsiella growing out of urine culture.  Will continue to follow.  Gout - We will continue allopurinol.  Dyslipidemia - We will continue Statin therapy.  CKD (chronic kidney disease), stage IV (HCC) Last hemoglobin 3.58 with a GFR of 17  Subjective: Patient awakened from sleep.  Offers no complaints.  Patient's wife has noticed that he has had blood in his urine for a long time.  Physical Exam: Vitals:   04/09/22 0600 04/09/22 0800  04/09/22 1000 04/09/22 1200  BP: (!) 126/52 (!) 129/52 137/64 (!) 112/52  Pulse: (!) 52 (!) 51 (!) 53 (!) 52  Resp: (!) 8 (!) 9 (!) 22 (!) 8  Temp: 98.8 F (37.1 C)     TempSrc: Oral     SpO2: 99% 100% 97% 100%  Weight:      Height:       Physical Exam HENT:     Head: Normocephalic.     Mouth/Throat:     Pharynx: No oropharyngeal exudate.  Eyes:     General: Lids are normal.  Cardiovascular:     Rate and Rhythm: Normal rate and regular rhythm.     Heart sounds: Normal heart sounds, S1 normal and S2 normal.  Pulmonary:     Breath sounds: No decreased breath sounds, wheezing, rhonchi or rales.  Abdominal:     Palpations: Abdomen is soft.     Tenderness: There is no abdominal tenderness.  Musculoskeletal:     Right lower leg: No swelling.     Left lower leg: No swelling.  Skin:    General: Skin is warm.     Findings: No rash.  Neurological:     Mental Status: He is alert.     Data Reviewed: Sodium 121, creatinine 3.58, hemoglobin 7.9  Family Communication: Updated patient's wife and daughter on the phone  Disposition: Status is: Inpatient Remains inpatient appropriate because: Watching hemoglobin closely with gross hematuria  Planned Discharge Destination: Home    Time spent: 28 minutes  Author: Loletha Grayer, MD 04/09/2022 2:16 PM  For on call review [email or web address].

## 2022-04-10 DIAGNOSIS — R310 Gross hematuria: Secondary | ICD-10-CM | POA: Diagnosis not present

## 2022-04-10 DIAGNOSIS — J189 Pneumonia, unspecified organism: Secondary | ICD-10-CM | POA: Diagnosis not present

## 2022-04-10 DIAGNOSIS — Z7189 Other specified counseling: Secondary | ICD-10-CM | POA: Diagnosis not present

## 2022-04-10 DIAGNOSIS — D62 Acute posthemorrhagic anemia: Secondary | ICD-10-CM | POA: Diagnosis not present

## 2022-04-10 DIAGNOSIS — R339 Retention of urine, unspecified: Secondary | ICD-10-CM | POA: Diagnosis not present

## 2022-04-10 DIAGNOSIS — E871 Hypo-osmolality and hyponatremia: Secondary | ICD-10-CM | POA: Diagnosis not present

## 2022-04-10 DIAGNOSIS — E43 Unspecified severe protein-calorie malnutrition: Secondary | ICD-10-CM

## 2022-04-10 DIAGNOSIS — R413 Other amnesia: Secondary | ICD-10-CM

## 2022-04-10 DIAGNOSIS — I10 Essential (primary) hypertension: Secondary | ICD-10-CM | POA: Diagnosis not present

## 2022-04-10 DIAGNOSIS — D500 Iron deficiency anemia secondary to blood loss (chronic): Secondary | ICD-10-CM | POA: Diagnosis not present

## 2022-04-10 DIAGNOSIS — N2889 Other specified disorders of kidney and ureter: Secondary | ICD-10-CM

## 2022-04-10 LAB — URINE CULTURE: Culture: >=100000 — AB

## 2022-04-10 LAB — BASIC METABOLIC PANEL
Anion gap: 9 (ref 5–15)
BUN: 65 mg/dL — ABNORMAL HIGH (ref 8–23)
CO2: 13 mmol/L — ABNORMAL LOW (ref 22–32)
Calcium: 9 mg/dL (ref 8.9–10.3)
Chloride: 103 mmol/L (ref 98–111)
Creatinine, Ser: 3.44 mg/dL — ABNORMAL HIGH (ref 0.61–1.24)
GFR, Estimated: 18 mL/min — ABNORMAL LOW (ref >60)
Glucose, Bld: 79 mg/dL (ref 70–99)
Potassium: 5.1 mmol/L (ref 3.5–5.1)
Sodium: 125 mmol/L — ABNORMAL LOW (ref 135–145)

## 2022-04-10 LAB — SODIUM
Sodium: 121 mmol/L — ABNORMAL LOW (ref 135–145)
Sodium: 126 mmol/L — ABNORMAL LOW (ref 135–145)
Sodium: 127 mmol/L — ABNORMAL LOW (ref 135–145)
Sodium: 127 mmol/L — ABNORMAL LOW (ref 135–145)
Sodium: 128 mmol/L — ABNORMAL LOW (ref 135–145)

## 2022-04-10 LAB — CBC
HCT: 24.4 % — ABNORMAL LOW (ref 39.0–52.0)
Hemoglobin: 8.2 g/dL — ABNORMAL LOW (ref 13.0–17.0)
MCH: 29.5 pg (ref 26.0–34.0)
MCHC: 33.6 g/dL (ref 30.0–36.0)
MCV: 87.8 fL (ref 80.0–100.0)
Platelets: 147 10*3/uL — ABNORMAL LOW (ref 150–400)
RBC: 2.78 MIL/uL — ABNORMAL LOW (ref 4.22–5.81)
RDW: 17.1 % — ABNORMAL HIGH (ref 11.5–15.5)
WBC: 7.6 10*3/uL (ref 4.0–10.5)
nRBC: 0 % (ref 0.0–0.2)

## 2022-04-10 MED ORDER — ADULT MULTIVITAMIN W/MINERALS CH
1.0000 | ORAL_TABLET | Freq: Every day | ORAL | Status: DC
  Administered 2022-04-11 – 2022-04-14 (×4): 1 via ORAL
  Filled 2022-04-10 (×4): qty 1

## 2022-04-10 MED ORDER — SODIUM CHLORIDE 0.9 % IV SOLN
300.0000 mg | Freq: Once | INTRAVENOUS | Status: AC
  Administered 2022-04-11: 300 mg via INTRAVENOUS
  Filled 2022-04-10: qty 300

## 2022-04-10 NOTE — Consult Note (Signed)
Pharmacy Consulted for monitoring of Hypertonic Saline  MIVF:  NaCL 3% @ 64m/h (pending start 1/3 @ 1515)  Date Time Na  1/1 2258 115  1/2 0512 118  1/2 1613 119  1/3 1006 121  1/3 1630 Na @ 30 mL/hr started  1/3 1816 123  1/4 0017 121  1/4 0720 125  1/4 1107 126    Goal of Therapy:  Goal of correction 861ml per 24 hours IF sodium rises > 4 mEq/L over 2 hours   > 6 mEq/L over 4 hours - Pharmacy will evaluate and contact MD per consult.  Plan:  Presenting with sodium of 121  MIVF NS stopped; NaCL 3% started 1/3 @ 1648 Most recent Na 126, representing a 5 mEq/L increase in approximately 18 hours. CTM labs q2h x2; then q4h thereafter.

## 2022-04-10 NOTE — Consult Note (Signed)
PHARMACY CONSULT NOTE - Hypertonic Saline  Pharmacy Consult for Hypertonic Saline Monitoring and Management  Recent Labs: Potassium (mmol/L)  Date Value  04/10/2022 5.1  09/26/2011 3.0 (L)   Magnesium (mg/dL)  Date Value  01/30/2022 2.2   Calcium (mg/dL)  Date Value  04/10/2022 9.0   Calcium, Total (mg/dL)  Date Value  09/26/2011 9.2   Albumin (g/dL)  Date Value  04/07/2022 2.1 (L)  11/24/2017 3.4 (L)   Phosphorus (mg/dL)  Date Value  01/30/2022 4.1   Sodium (mmol/L)  Date Value  04/10/2022 127 (L)  11/24/2017 141  09/26/2011 136    Assessment  Troy Berry. is a 77 y.o. male presenting with hematuria. PMH significant for CKD4, HTN, anemia, dementia. Patient initially received NS infusion for Na correction with minimal improvement (115 >> 121). Pharmacy has been consulted to monitor and and manage hypertonic saline (3%).  Baseline Labs: Serum Na 119, Serum Osm 278  Goal of Therapy:  Increase in Na by 4-6 mEq/L in 4-6 hours Do not exceed increase in Na by 10-12 mEq/L in 24 hours  Monitoring:  Date Time Na Rate (mL/hr)  1/3 1816 123 30 1/4 0017 121 30 1/4 0720 125 30 1/4 1107 126 30 1/4 1455 127 30 1/4 1937 128 30  Plan:  Initiate 3% saline at 30 mL/hr Check Na Q4H  Stop infusion if  Na increases > 6 mEq/L in 6 hours  Na increases > 8 mEq/L in 8 hours (give D5W bolus) Continue to monitor for signs of clinical improvement and recommendations per nephrology  Gretel Acre, PharmD PGY1 Pharmacy Resident 04/10/2022 5:13 PM

## 2022-04-10 NOTE — Assessment & Plan Note (Signed)
Continue supplements

## 2022-04-10 NOTE — Consult Note (Signed)
Pharmacy Consulted for monitoring of Hypertonic Saline  MIVF:  NaCL 3% @ 29m/h (pending start 1/3 @ 1515)  Date Time Na  1/1 2258 115  1/2 0512 118  1/2 1613 119  1/3 1006 121  1/3 1630 Na @ 30 mL/hr started  1/3 1816 123  1/4 0017 121    Goal of Therapy:  Goal of correction 852ml per 24 hours IF sodium rises > 4 mEq/L over 2 hours   > 6 mEq/L over 4 hours - Pharmacy will evaluate and contact MD per consult.  Plan:  Presenting with sodium of 121  MIVF NS stopped; NaCL 3% started 1/3 @ 1648 Na: 121 > CTM labs q2h x2; then q4h thereafter.

## 2022-04-10 NOTE — Consult Note (Signed)
Pharmacy Consulted for monitoring of Hypertonic Saline  MIVF:  NaCL 3% @ 55m/h (pending start 1/3 @ 1515)  Date Time Na  1/1 2258 115  1/2 0512 118  1/2 1613 119  1/3 1006 121  1/3 1630 Na @ 30 mL/hr started  1/3 1816 123  1/4 0017 121  1/4 0720 125    Goal of Therapy:  Goal of correction 812ml per 24 hours IF sodium rises > 4 mEq/L over 2 hours   > 6 mEq/L over 4 hours - Pharmacy will evaluate and contact MD per consult.  Plan:  Presenting with sodium of 121  MIVF NS stopped; NaCL 3% started 1/3 @ 1648 Most recent Na 125, representing a 58m17mL increase in approximately 14 hours. CTM labs q2h x2; then q4h thereafter.

## 2022-04-10 NOTE — Evaluation (Signed)
Occupational Therapy Evaluation Patient Details Name: Troy Berry. MRN: [id number] DOB: [birth year] Today's Date: 04/10/2022   History of Present Illness Troy Reyez. is a 77 y.o. male with a past medical history of anemia, CKD, hypertension, presents to the emergency department from home for shortness of breath and cough.  According to the family members patient is largely cared for by home health workers as the patient is bedbound.  States since this morning he has a very wet sounding cough and appears to have difficulty breathing at times.  They also state the patient's catheter was not draining correctly although currently is draining urine.  Patient has chronic hematuria per family for at least the last several months this is not new.   Clinical Impression   Patient presenting with decreased Ind in self care,balance, functional mobility/transfers, endurance, and safety awareness. Pt is poor historian. Per chart review, pt with recent rehab stay from October - November of 2023 and once returning home his family has been caring for him from bed level. Pt needing max- total A for functional mobility this session. Pt was resistive to movement once seated on EOB with posterior bias and unsafe to attempt transfers at this time.  Patient will benefit from acute OT to increase overall independence in the areas of ADLs, functional mobility, and safety awareness  in order to safely discharge home with family. Hospital bed also recommended to decrease caregiver burden.      Recommendations for follow up therapy are one component of a multi-disciplinary discharge planning process, led by the attending physician.  Recommendations may be updated based on patient status, additional functional criteria and insurance authorization.   Follow Up Recommendations  Home health OT     Assistance Recommended at Discharge Frequent or constant Supervision/Assistance  Patient can return home with the  following A lot of help with bathing/dressing/bathroom;Two people to help with walking and/or transfers;Assistance with feeding;Help with stairs or ramp for entrance;Assist for transportation;Assistance with cooking/housework;Direct supervision/assist for financial management;Direct supervision/assist for medications management    Functional Status Assessment  Patient has had a recent decline in their functional status and/or demonstrates limited ability to make significant improvements in function in a reasonable and predictable amount of time  Equipment Recommendations  Hospital bed    Recommendations for Other Services       Precautions / Restrictions Precautions Precautions: Fall Restrictions Weight Bearing Restrictions: No      Mobility Bed Mobility Overal bed mobility: Needs Assistance Bed Mobility: Supine to Sit, Sit to Supine     Supine to sit: Max assist, HOB elevated Sit to supine: Mod assist        Transfers                   General transfer comment: pt spontaneously returning to supine, resistive to transfer attempt.      Balance Overall balance assessment: Needs assistance Sitting-balance support: Bilateral upper extremity supported, Feet supported Sitting balance-[name]: Poor   Postural control: Posterior lean, Left lateral lean                                 ADL either performed or assessed with clinical judgement   ADL Overall ADL's : Needs assistance/impaired  General ADL Comments: hand over hand total A for self care tasks     Vision Patient Visual Report: No change from baseline       Perception     Praxis      Pertinent Vitals/Pain Pain Assessment Pain Assessment: Faces Faces Pain Scale: No hurt     Hand Dominance Right   Extremity/Trunk Assessment Upper Extremity Assessment Upper Extremity Assessment: Generalized weakness   Lower Extremity  Assessment Lower Extremity Assessment: Generalized weakness       Communication Communication Communication: Other (comment)   Cognition Arousal/Alertness: Awake/alert Behavior During Therapy: Flat affect Overall Cognitive Status: History of cognitive impairments - at baseline                                 General Comments: Pt answers "yes" and "no" to questions. Mod - max multimodal cuing.     General Comments       Exercises     Shoulder Instructions      Home Living Family/patient expects to be discharged to:: Private residence Living Arrangements: Spouse/significant other Available Help at Discharge: Family;Available 24 hours/day;Home health Type of Home: House Home Access: Level entry     Home Layout: One level     Bathroom Shower/Tub: Occupational psychologist: Standard Bathroom Accessibility: Yes   Home Equipment: Conservation officer, nature (2 wheels);BSC/3in1;Wheelchair - manual   Additional Comments: hoyer lift      Prior Functioning/Environment Prior Level of Function : Needs assist             Mobility Comments: Per EMR pt grossly bed bound. In october 2023 was ambulatory with RW. ADLs Comments: Spouse and HH services assisting pt in ADL's per EMR        OT Problem List: Decreased strength;Decreased activity tolerance;Impaired balance (sitting and/or standing);Decreased knowledge of use of DME or AE;Decreased safety awareness;Decreased knowledge of precautions;Decreased cognition      OT Treatment/Interventions: Self-care/ADL training;Therapeutic exercise;Therapeutic activities;Energy conservation;DME and/or AE instruction;Patient/family education;Balance training    OT Goals(Current goals can be found in the care plan section) Acute Rehab OT Goals Patient Stated Goal: none stated OT Goal Formulation: Patient unable to participate in goal setting Time For Goal Achievement: 04/24/22 Potential to Achieve Goals: Good ADL Goals Pt  Will Perform Grooming: sitting;with min assist Pt Will Perform Lower Body Dressing: with max assist Pt/caregiver will Perform Home Exercise Program: Increased ROM;Increased strength;Both right and left upper extremity;With written HEP provided;With minimal assist  OT Frequency: Min 2X/week    Co-evaluation              AM-PAC OT "6 Clicks" Daily Activity     Outcome Measure Help from another person eating meals?: A Little Help from another person taking care of personal grooming?: A Little Help from another person toileting, which includes using toliet, bedpan, or urinal?: Total Help from another person bathing (including washing, rinsing, drying)?: Total Help from another person to put on and taking off regular upper body clothing?: Total Help from another person to put on and taking off regular lower body clothing?: Total 6 Click Score: 10   End of Session Nurse Communication: Mobility status  Activity Tolerance: Patient limited by fatigue Patient left: in bed;with call bell/phone within reach;with bed alarm set  OT Visit Diagnosis: Muscle weakness (generalized) (M62.81)                Time: 7494-4967 OT Time  Calculation (min): 19 min Charges:  OT General Charges $OT Visit: 1 Visit OT Evaluation $OT Eval Moderate Complexity: 1 Mod OT Treatments $Therapeutic Activity: 8-22 mins  Darleen Crocker, MS, OTR/L , CBIS ascom [phone]  04/10/22, 4:41 PM

## 2022-04-10 NOTE — Evaluation (Signed)
Physical Therapy Evaluation Patient Details Name: Troy Berry. MRN: [id number] DOB: Nov 15, 1945 Today's Date: 04/10/2022  History of Present Illness  Troy Berry. is a 77 y.o. male with a past medical history of anemia, CKD, hypertension, presents to the emergency department from home for shortness of breath and cough.  According to the family members patient is largely cared for by home health workers as the patient is bedbound.  States since this morning he has a very wet sounding cough and appears to have difficulty breathing at times.  They also state the patient's catheter was not draining correctly although currently is draining urine.  Patient has chronic hematuria per family for at least the last several months this is not new.   Clinical Impression  Pt admitted with above diagnosis. Pt received side lying in bed. Appearing lethargic but awakens to voice. Does not communicate in effective manner due to baseline dementia. Per EMR pt is bed bound  but was ambulating with RW in October of 2023. At baseline has been receiving family support for ADL's and IADL's and [place] services.   To date pt follows single step commands with mod multi modal cuing and some hand over hand guidance to sit EOB with fair core strength able to come to long sitting but needs assist at LE's and torso to sit EOB. Once sitting, significant posterior bias and L lateral lean reliant on modA from PT to maintain upright sitting to prevent sliding onto floor. Cognition appears biggest limiting factor with pt spontaneously returning to supine prior to standing attempts. MinA required at LE's to return to supine and maxA+1 in trendelenburg to be supine at [facility]. Pt with pillows placed b/t LE's for skin integrity. Hard to say if pt is close to baseline, but will trial PT during admission to attempt to improve mobility for reduced caregiver burden. Pt currently with functional limitations due to the deficits listed below (see  PT Problem List). Pt will benefit from skilled PT to increase their independence and safety with mobility to allow discharge to the venue listed below.          Recommendations for follow up therapy are one component of a multi-disciplinary discharge planning process, led by the attending physician.  Recommendations may be updated based on patient status, additional functional criteria and insurance authorization.  Follow Up Recommendations Home health PT      Assistance Recommended at Discharge Frequent or constant Supervision/Assistance  Patient can return home with the following  A lot of help with walking and/or transfers;A lot of help with bathing/dressing/bathroom;Assistance with feeding;Assist for transportation;Help with stairs or ramp for entrance;Assistance with cooking/housework    Equipment Recommendations Hospital bed (per EMR pt has majority of DME. Pt's family requesting hospital bed. PT agreeable this would reduce care giver burden.)  Recommendations for Other Services       Functional Status Assessment Patient has had a recent decline in their functional status and demonstrates the ability to make significant improvements in function in a reasonable and predictable amount of time.     Precautions / Restrictions Precautions Precautions: Fall Restrictions Weight Bearing Restrictions: No      Mobility  Bed Mobility Overal bed mobility: Needs Assistance Bed Mobility: Supine to Sit, Sit to Supine     Supine to sit: Max assist, HOB elevated Sit to supine: Min assist   General bed mobility comments: to return to supine, minA at LE's Patient Response: Cooperative, Flat affect  Transfers  General transfer comment: pt spontaneously returning to supine, resistive to transfer attempt.    Ambulation/Gait                  Stairs            Wheelchair Mobility    Modified Rankin (Stroke Patients Only)       Balance  Overall balance assessment: Needs assistance Sitting-balance support: Bilateral upper extremity supported, Feet supported Sitting balance-[name]: Poor Sitting balance - Comments: UNable to attain upright without physical assistance with heavy posterior bias Postural control: Posterior lean, Left lateral lean                                   Pertinent Vitals/Pain Pain Assessment Pain Assessment: Faces Faces Pain Scale: No hurt    Home Living Family/patient expects to be discharged to:: Private residence Living Arrangements: Spouse/significant other Available Help at Discharge: Family;Available 24 hours/day;Home health Type of Home: House Home Access: Level entry       Home Layout: One level Home Equipment: Conservation officer, nature (2 wheels);BSC/3in1;Wheelchair - manual (hoyer lift)      Prior Function               Mobility Comments: Per EMR pt grossly bed bound. In otcober 2023 was ambulatory with RW. ADLs Comments: Spouse and HH services assisting pt in ADL's per EMR     Hand Dominance        Extremity/Trunk Assessment   Upper Extremity Assessment Upper Extremity Assessment: Defer to OT evaluation    Lower Extremity Assessment Lower Extremity Assessment: Generalized weakness       Communication   Communication: Other (comment) (history of dementia.)  Cognition Arousal/Alertness: Lethargic Behavior During Therapy: Flat affect Overall Cognitive Status: History of cognitive impairments - at baseline                                 General Comments: Unable to answer questions. Does respond to single step commands with hand over hand guidance and multimodal cuing.        General Comments      Exercises     Assessment/Plan    PT Assessment Patient needs continued PT services  PT Problem List Decreased strength;Decreased cognition;Decreased activity tolerance;Decreased balance;Decreased mobility       PT Treatment  Interventions DME instruction;Therapeutic exercise;Gait training;Balance training;Neuromuscular re-education;Functional mobility training;Therapeutic activities;Patient/family education    PT Goals (Current goals can be found in the Care Plan section)  Acute Rehab PT Goals PT Goal Formulation: Patient unable to participate in goal setting    Frequency Min 2X/week     Co-evaluation               AM-PAC PT "6 Clicks" Mobility  Outcome Measure Help needed turning from your back to your side while in a flat bed without using bedrails?: A Lot Help needed moving from lying on your back to sitting on the side of a flat bed without using bedrails?: A Lot Help needed moving to and from a bed to a chair (including a wheelchair)?: Total Help needed standing up from a chair using your arms (e.g., wheelchair or bedside chair)?: A Lot Help needed to walk in hospital room?: Total Help needed climbing 3-5 steps with a railing? : Total 6 Click Score: 9    End of Session  Activity Tolerance: Patient tolerated treatment well Patient left: in bed;with call bell/phone within reach;with bed alarm set   PT Visit Diagnosis: Muscle weakness (generalized) (M62.81)    Time: 8102-5486 PT Time Calculation (min) (ACUTE ONLY): 22 min   Charges:   PT Evaluation $PT Eval Moderate Complexity: [place] M. Fairly IV, PT, DPT Physical Therapist- [facility]  04/10/2022, 3:41 PM

## 2022-04-10 NOTE — Progress Notes (Signed)
Patient irrigated with saline. Removed several large clots. Patient urine is remains bloody. Will irrigate again as need due to clot formation.

## 2022-04-10 NOTE — Plan of Care (Signed)
  Problem: Activity: Goal: Ability to tolerate increased activity will improve Outcome: Progressing   

## 2022-04-10 NOTE — Progress Notes (Signed)
Urology Inpatient Progress Note  Subjective: No acute events overnight.  Nursing has been irrigating his catheter at the bedside. Hemoglobin stable, 8.2.  He is afebrile, VSS. Suprapubic catheter in place, plugged.  Urethral Foley in place draining clear, red urine without clots. He is lying in bed, awake, and unaccompanied.  I asked him how he is doing this morning and he replied "yes."  He states "no" when asked if he is having any pain.  Anti-infectives: Anti-infectives (From admission, onward)    Start     Dose/Rate Route Frequency Ordered Stop   04/09/22 0000  cefTRIAXone (ROCEPHIN) 2 g in sodium chloride 0.9 % 100 mL IVPB        2 g 200 mL/hr over 30 Minutes Intravenous Every 24 hours 04/08/22 0052 04/12/22 2359   04/09/22 0000  azithromycin (ZITHROMAX) 500 mg in sodium chloride 0.9 % 250 mL IVPB        500 mg 250 mL/hr over 60 Minutes Intravenous Every 24 hours 04/08/22 0052 04/12/22 2359   04/08/22 0230  cefTRIAXone (ROCEPHIN) 1 g in sodium chloride 0.9 % 100 mL IVPB        1 g 200 mL/hr over 30 Minutes Intravenous  Once 04/08/22 0226 04/08/22 0452   04/08/22 0000  cefTRIAXone (ROCEPHIN) 1 g in sodium chloride 0.9 % 100 mL IVPB        1 g 200 mL/hr over 30 Minutes Intravenous  Once 04/07/22 2358 04/08/22 0044   04/08/22 0000  azithromycin (ZITHROMAX) 500 mg in sodium chloride 0.9 % 250 mL IVPB        500 mg 250 mL/hr over 60 Minutes Intravenous  Once 04/07/22 2358 04/08/22 0156       Current Facility-Administered Medications  Medication Dose Route Frequency Provider Last Rate Last Admin   0.9 %  sodium chloride infusion  10 mL/hr Intravenous Once Harvest Dark, MD   Held at 04/08/22 0054   acetaminophen (TYLENOL) tablet 650 mg  650 mg Oral Q6H PRN Mansy, Jan A, MD       Or   acetaminophen (TYLENOL) suppository 650 mg  650 mg Rectal Q6H PRN Mansy, Jan A, MD       allopurinol (ZYLOPRIM) tablet 300 mg  300 mg Oral Daily Mansy, Jan A, MD   300 mg at 04/10/22 0855    amLODipine (NORVASC) tablet 10 mg  10 mg Oral Daily Mansy, Jan A, MD   10 mg at 04/10/22 0856   azithromycin (ZITHROMAX) 500 mg in sodium chloride 0.9 % 250 mL IVPB  500 mg Intravenous Q24H Mansy, Jan A, MD   Stopped at 04/10/22 0216   calcium-vitamin D (OSCAL WITH D) 500-5 MG-MCG per tablet 1 tablet  1 tablet Oral Daily Mansy, Jan A, MD   1 tablet at 04/10/22 0856   cefTRIAXone (ROCEPHIN) 2 g in sodium chloride 0.9 % 100 mL IVPB  2 g Intravenous Q24H Mansy, Jan A, MD   Stopped at 04/10/22 0015   cholecalciferol (VITAMIN D3) 25 MCG (1000 UNIT) tablet 1,000 Units  1,000 Units Oral Daily Mansy, Jan A, MD   1,000 Units at 04/10/22 0855   feeding supplement (ENSURE ENLIVE / ENSURE PLUS) liquid 237 mL  237 mL Oral TID BM Loletha Grayer, MD   237 mL at 04/10/22 0856   ferrous sulfate tablet 325 mg  325 mg Oral Daily Mansy, Jan A, MD   325 mg at 04/10/22 0855   finasteride (PROSCAR) tablet 5 mg  5 mg Oral Daily  Mansy, Jan A, MD   5 mg at 04/10/22 0856   magnesium hydroxide (MILK OF MAGNESIA) suspension 30 mL  30 mL Oral Daily PRN Mansy, Jan A, MD       ondansetron [facility]) tablet 4 mg  4 mg Oral Q6H PRN Mansy, Jan A, MD       Or   ondansetron [facility]) injection 4 mg  4 mg Intravenous Q6H PRN Mansy, Jan A, MD       polyethylene glycol (MIRALAX / GLYCOLAX) packet 17 g  17 g Oral Daily PRN Mansy, Jan A, MD       pravastatin (PRAVACHOL) tablet 40 mg  40 mg Oral q1800 Mansy, Jan A, MD   40 mg at 04/09/22 1821   sodium chloride (hypertonic) 3 % solution   Intravenous Continuous Colon Flattery, NP 30 mL/hr at 04/10/22 0855 New Bag at 04/10/22 0855   traZODone (DESYREL) tablet 25 mg  25 mg Oral QHS PRN Mansy, Jan A, MD       Objective: Vital signs in last 24 hours: Temp:  [97.3 F (36.3 C)-98.4 F (36.9 C)] 98.4 F (36.9 C) (01/04 0841) Pulse Rate:  [52-63] 60 (01/04 0841) Resp:  [8-22] 18 (01/04 0841) BP: (112-144)/(52-68) 144/60 (01/04 0841) SpO2:  [96 %-100 %] 100 % (01/04 0841)  Intake/Output  from previous day: 01/03 0701 - 01/04 0700 In: 721 [I.V.:286.2; IV Piggyback:364.8] Out: 7209 [OBSJG:2836] Intake/Output this shift: No intake/output data recorded.  Physical Exam Vitals and nursing note reviewed.  Constitutional:      General: He is not in acute distress.    Appearance: He is not ill-appearing, toxic-appearing or diaphoretic.  HENT:     Head: Normocephalic and atraumatic.  Pulmonary:     Effort: Pulmonary effort is normal. No respiratory distress.  Skin:    General: Skin is warm and dry.  Neurological:     Mental Status: He is alert. Mental status is at baseline.  Psychiatric:        Mood and Affect: Mood normal.        Behavior: Behavior normal.    Lab Results:  Recent Labs    04/09/22 1316 04/10/22 0720  WBC 8.9 7.6  HGB 7.9* 8.2*  HCT 23.5* 24.4*  PLT 134* 147*   BMET Recent Labs    04/09/22 1006 04/09/22 1816 04/10/22 0017 04/10/22 0720  NA 121*   < > 121* 125*  K 4.8  --   --  5.1  CL 99  --   --  103  CO2 16*  --   --  13*  GLUCOSE 83  --   --  79  BUN 66*  --   --  65*  CREATININE 3.58*  --   --  3.44*  CALCIUM 8.9  --   --  9.0   < > = values in this interval not displayed.   Studies/Results: ECHOCARDIOGRAM COMPLETE  Result Date: 04/09/2022    ECHOCARDIOGRAM REPORT   Patient Name:   Troy Berry. Date of Exam: 04/09/2022 Medical Rec #:  [id number]            Height:       68.0 in Accession #:    [id number]           Weight:       148.0 lb Date of Birth:  08/08/1945            BSA:          1.798  m Patient Age:    77 years             BP:           126/52 mmHg Patient Gender: M                    HR:           52 bpm. Exam Location:  [facility] Procedure: 2D Echo, Cardiac Doppler and Color Doppler Indications:     Abnormal ECG R94.31  History:         Patient has no prior history of Echocardiogram examinations.                  Risk Factors:Hypertension. CKD.  Sonographer:     Sherrie Sport Referring Phys:  [id number] [facility] AMIN Diagnosing  Phys: Ida Rogue MD  Sonographer Comments: Suboptimal parasternal window. IMPRESSIONS  1. Challenging images  2. Left ventricular ejection fraction, by estimation, is 55 to 60%. The left ventricle has normal function. The left ventricle has no regional wall motion abnormalities. There is mild left ventricular hypertrophy, unable to exclude moderate hypertrophy of the inferior and lateral wall. Left ventricular diastolic parameters are consistent with Grade I diastolic dysfunction (impaired relaxation).  3. Right ventricular systolic function is mildly reduced, though not well visualized. The right ventricular size is normal. There is normal pulmonary artery systolic pressure. The estimated right ventricular systolic pressure is 00.9 mmHg.  4. Left atrial size was mild to moderately dilated.  5. The mitral valve is normal in structure. Moderate mitral valve regurgitation. No evidence of mitral stenosis.  6. The aortic valve is normal in structure. Aortic valve regurgitation is moderate to severe. No aortic stenosis is present.  7. There is severe dilatation of the aortic root, measuring 49 mm.Ascending aorta not well visulized.  8. The inferior vena cava is normal in size with greater than 50% respiratory variability, suggesting right atrial pressure of 3 mmHg. FINDINGS  Left Ventricle: Left ventricular ejection fraction, by estimation, is 55 to 60%. The left ventricle has normal function. The left ventricle has no regional wall motion abnormalities. The left ventricular internal cavity size was normal in size. There is  mild left ventricular hypertrophy. Left ventricular diastolic parameters are consistent with Grade I diastolic dysfunction (impaired relaxation). Right Ventricle: The right ventricular size is normal. No increase in right ventricular wall thickness. Right ventricular systolic function is mildly reduced. There is normal pulmonary artery systolic pressure. The tricuspid regurgitant velocity is 2.78  m/s, and with an assumed right atrial pressure of 5 mmHg, the estimated right ventricular systolic pressure is 38.1 mmHg. Left Atrium: Left atrial size was mild to moderately dilated. Right Atrium: Right atrial size was normal in size. Pericardium: There is no evidence of pericardial effusion. Mitral Valve: The mitral valve is normal in structure. There is mild calcification of the mitral valve leaflet(s). Mild mitral annular calcification. Moderate mitral valve regurgitation. No evidence of mitral valve stenosis. Tricuspid Valve: The tricuspid valve is normal in structure. Tricuspid valve regurgitation is not demonstrated. No evidence of tricuspid stenosis. Aortic Valve: The aortic valve is normal in structure. Aortic valve regurgitation is moderate to severe. Aortic regurgitation PHT measures 433 msec. No aortic stenosis is present. Aortic valve mean gradient measures 4.0 mmHg. Aortic valve peak gradient measures 6.7 mmHg. Aortic valve area, by VTI measures 2.68 cm. Pulmonic Valve: The pulmonic valve was normal in structure. Pulmonic valve regurgitation is not visualized. No evidence of pulmonic  stenosis. Aorta: The aortic root is normal in size and structure. There is severe dilatation of the aortic root, measuring 49 mm. Venous: The inferior vena cava is normal in size with greater than 50% respiratory variability, suggesting right atrial pressure of 3 mmHg. IAS/Shunts: No atrial level shunt detected by color flow Doppler.  LEFT VENTRICLE PLAX 2D LVIDd:         4.90 cm   Diastology LVIDs:         3.50 cm   LV e' medial:    5.22 cm/s LV PW:         1.10 cm   LV E/e' medial:  14.9 LV IVS:        1.20 cm   LV e' lateral:   10.80 cm/s LVOT diam:     2.10 cm   LV E/e' lateral: 7.2 LV SV:         81 LV SV Index:   45 LVOT Area:     3.46 cm  RIGHT VENTRICLE RV Basal diam:  2.50 cm RV Mid diam:    3.10 cm RV S prime:     15.90 cm/s TAPSE (M-mode): 1.9 cm LEFT ATRIUM           Index        RIGHT ATRIUM           Index  LA diam:      4.10 cm 2.28 cm/m   RA Area:     10.80 cm LA Vol (A4C): 76.2 ml 42.37 ml/m  RA Volume:   15.90 ml  8.84 ml/m  AORTIC VALVE AV Area (Vmax):    3.52 cm AV Area (Vmean):   2.98 cm AV Area (VTI):     2.68 cm AV Vmax:           129.00 cm/s AV Vmean:          95.200 cm/s AV VTI:            0.301 m AV Peak Grad:      6.7 mmHg AV Mean Grad:      4.0 mmHg LVOT Vmax:         131.00 cm/s LVOT Vmean:        81.800 cm/s LVOT VTI:          0.233 m LVOT/AV VTI ratio: 0.77 AI PHT:            433 msec  AORTA Ao Root diam: 5.07 cm MITRAL VALVE                TRICUSPID VALVE MV Area (PHT): 2.70 cm     TR Peak grad:   30.9 mmHg MV Decel Time: 281 msec     TR Vmax:        278.00 cm/s MV E velocity: 77.60 cm/s MV A velocity: 123.00 cm/s  SHUNTS MV E/A ratio:  0.63         Systemic VTI:  0.23 m                             Systemic Diam: 2.10 cm Ida Rogue MD Electronically signed by Ida Rogue MD Signature Date/Time: 04/09/2022/12:44:09 PM    Final    US RENAL  Result Date: 04/08/2022 CLINICAL DATA:  Hydronephrosis, left.  Renal mass on CT. EXAM: RENAL / URINARY TRACT ULTRASOUND COMPLETE COMPARISON:  Noncontrast abdominal CT 04/07/2022 and 03/05/2022. Renal ultrasound 12/21/2017. FINDINGS: Right Kidney: Renal  measurements: 10.1 x 4.0 x 4.7 cm = volume: 99.1 mL. Cortical thinning with increased cortical echogenicity. Small renal cysts measuring up to 1.8 cm in diameter. No hydronephrosis. Left Kidney: Renal measurements: 12.3 x 6.1 x 5.7 cm = volume: 221.4 mL. Multiple cysts are noted, measuring up to 4.2 cm in the upper pole. No discrete mass is seen to correspond with the abnormality on recent CT. Bladder: Suprapubic catheter in place. The urinary bladder is mildly distended. Other: Ascites and a small left pleural effusion are noted. The prostate gland is moderately enlarged. Numerous hypoechoic liver lesions are noted, incompletely characterized. Examination limited by patient condition. IMPRESSION: 1. No  discrete left renal mass identified by ultrasound to correspond with the abnormality on recent CT's. CT finding remains indeterminate and could reflect parenchymal hemorrhage or a hemorrhagic mass. Recommend further evaluation with dedicated renal CT or MRI (without and with contrast). 2. Bilateral renal cysts. 3. Right renal cortical thinning and increased cortical echogenicity, nonspecific but can be seen with chronic medical renal disease. 4. Ascites and small left pleural effusion. 5. Numerous hypoechoic liver lesions, incompletely characterized. Electronically Signed   By: Richardean Sale M.D.   On: 04/08/2022 10:33    Assessment & Plan: A 14-year-old comorbid male with dementia, chronic urinary retention managed with suprapubic catheter and persistent gross hematuria currently admitted with CAP, hyponatremia, and acute on chronic blood loss anemia, now s/p 2 units PRBCs with recent imaging concerning for a possible neoplasm of the left kidney.  Urethral Foley continues to drain.  His blood counts are stable and gross hematuria is ongoing.  Will plan to keep suprapubic catheter in place and plugged for now, as we are able to keep a larger caliber catheter in his urethra which should allow for easier irrigation of further clots.  No indication for CBI at this time, we will plan to continue manual irrigation of his catheter as needed.  Unfortunately, I expect his gross hematuria will persist; I do not anticipate resolution of gross hematuria at this time.  Recommendations: -Continue to trend CBC and transfuse as needed -Hyponatremia, CAP management per primary team -Continue manually irrigating urethral Foley as needed to keep draining -Obtain urine cytology, ordered 2 days ago -Palliative care consult per previous notes  Debroah Loop, PA-C 04/10/2022

## 2022-04-10 NOTE — Progress Notes (Addendum)
Pt new admit to unit, came up w/ suprapubic catheter that appears to be clogged per urology,  urethral catheter in place, with dark red output, catheter irrigated w/ 70 ml NS, 450 ml output at this time, urine still dark red, will monitor x2 hr and irrigate if needed to attempt to clear catheter.pt tolerated well, no increase in pain or discomfort, some sediment observed in output but no clots at this time, will continue to monitor closely, urology to reassess later today

## 2022-04-10 NOTE — TOC CM/SW Note (Signed)
    Durable Medical Equipment  (From admission, onward)           Start     Ordered   04/10/22 1151  For home use only DME Hospital bed  Once       Question Answer Comment  Length of Need Lifetime   Patient has (list medical condition): bedbound   The above medical condition requires: Patient requires the ability to reposition frequently   Head must be elevated greater than: 30 degrees   Bed type Semi-electric   [employer] Yes   Trapeze Bar Yes   Support Surface: Gel Overlay      04/10/22 1150

## 2022-04-10 NOTE — Consult Note (Signed)
PHARMACY CONSULT NOTE - Hypertonic Saline  Pharmacy Consult for Hypertonic Saline Monitoring and Management  Recent Labs: Potassium (mmol/L)  Date Value  04/10/2022 5.1  09/26/2011 3.0 (L)   Magnesium (mg/dL)  Date Value  01/30/2022 2.2   Calcium (mg/dL)  Date Value  04/10/2022 9.0   Calcium, Total (mg/dL)  Date Value  09/26/2011 9.2   Albumin (g/dL)  Date Value  04/07/2022 2.1 (L)  11/24/2017 3.4 (L)   Phosphorus (mg/dL)  Date Value  01/30/2022 4.1   Sodium (mmol/L)  Date Value  04/10/2022 127 (L)  11/24/2017 141  09/26/2011 136    Assessment  Troy Beals. is a 77 y.o. male presenting with hematuria. PMH significant for CKD4, HTN, anemia, dementia. Patient initially received NS infusion for Na correction with minimal improvement (115 >> 121). Pharmacy has been consulted to monitor and and manage hypertonic saline (3%).  Baseline Labs: Serum Na 119, Serum Osm 278  Goal of Therapy:  Increase in Na by 4-6 mEq/L in 4-6 hours Do not exceed increase in Na by 10-12 mEq/L in 24 hours  Monitoring:  Date Time Na Rate (mL/hr)  1/3 1816 123 30 1/4 0017 121 30 1/4 0720 125 30 1/4 1107 126 30 1/4 1455 127 30 1/4 1937 128 30 1/4       2253    127     30  Plan:  Initiate 3% saline at 30 mL/hr Check Na Q4H  Stop infusion if  Na increases > 6 mEq/L in 6 hours  Na increases > 8 mEq/L in 8 hours (give D5W bolus) Continue to monitor for signs of clinical improvement and recommendations per nephrology  Troy Berry D 04/10/2022 11:33 PM

## 2022-04-10 NOTE — TOC Initial Note (Addendum)
Transition of Care (TOC) - Initial/Assessment Note    Patient Details  Name: Troy Berry. MRN: [id number] Date of Birth: [birth year]  Transition of Care [facility]) CM/SW Contact:    Candie Chroman, LCSW Phone Number: 04/10/2022, 9:36 AM  Clinical Narrative: Readmission prevention screen complete. Patient only oriented to self. Called wife, introduced role, and explained that discharge planning would be discussed. PCP office is [facility]. Wife cannot remember provider's name. She has been having difficulty getting him to appointments due to his weakness and her inability to get him into a wheelchair on her own. SDOH flag for transportation. Last admission, CSW provided information on medical transportation through [place]. Pharmacy is Paediatric nurse on [address]. No issues obtaining medications. Patient lives home with his wife. He was at [facility] 10/27-11/21 (about 24 days) and discharged home with home health through [facility]. Per wife, he has only seen a Marine scientist and Education officer, museum. CSW asked [facility] liaison to add PT, OT, aide at discharge. Wife stated he has a Civil Service fast streamer, wheelchair, and RW at home. She is thinking he might need a hospital bed but is not sure if he would like it or not. She will discuss with her daughter and follow back up. No further concerns. CSW encouraged patient's wife to contact CSW as needed. CSW will continue to follow patient and his wife for support and facilitate return home once stable. Asked wife if he would need EMS transport home but she declined, stating she already has bills due to them. She will ask her son-in-law to help her get him home at discharge.                 11:42 am: Received call from daughter. They are agreeable to hospital bed. She is also asking that PT and OT work with him while he is here with plan to return home with home health at discharge. Sent secure chat to MD.  12:59 pm: Per daughter request due to difficulty  getting patient to appointments, [facility] will perform suprapubic catheter changes at home. Urology will provide enough supplies for two changes prior to discharge so [facility] has enough time to order supplies. Daughter asked about getting home PCP services. Left message for [facility] liaison to see if [place] is within their service area.  1:47 pm: [facility] liaison knows someone that provides in-home primary care services. They will follow up with her tonight to see if they can accept and [facility] liaison will contact daughter tomorrow. She is aware. [employer] contacted wife regarding copays for hospital bed. She is going to talk to daughter about it.  Expected Discharge Plan: [place] Barriers to Discharge: Continued Medical Work up   Patient Goals and CMS Choice Patient states their goals for this hospitalization and ongoing recovery are:: Patient not fully oriented.   Choice offered to / list presented to : NA      Expected Discharge Plan and Services     Post Acute Care Choice: Resumption of Svcs/PTA Provider Living arrangements for the past 2 months: Single Family Home                           HH Arranged: RN, PT, OT, Nurse's Aide, Social Work [employer] Agency: [place] Date [place]: 04/10/22   Representative spoke with at [place]: Sharmon Revere  Prior Living Arrangements/Services Living arrangements for the past 2 months: Single Family  Home Lives with:: Spouse Patient language and need for interpreter reviewed:: Yes Do you feel safe going back to the place where you live?: Yes      Need for Family Participation in Patient Care: Yes (Comment) Care giver support system in place?: Yes (comment) Current home services: DME, Home RN, Other (comment) (Home social worker) Criminal Activity/Legal Involvement Pertinent to Current Situation/Hospitalization: No - Comment as needed  Activities of Daily Living Home Assistive  Devices/Equipment: Eyeglasses, Wheelchair ADL Screening (condition at time of admission) Patient's cognitive ability adequate to safely complete daily activities?: No Is the patient deaf or have difficulty hearing?: No Does the patient have difficulty seeing, even when wearing glasses/contacts?: No Does the patient have difficulty concentrating, remembering, or making decisions?: Yes Patient able to express need for assistance with ADLs?: Yes Does the patient have difficulty dressing or bathing?: Yes Independently performs ADLs?: No Communication: Independent Dressing (OT): Needs assistance Is this a change from baseline?: Pre-admission baseline Grooming: Needs assistance Is this a change from baseline?: Pre-admission baseline Feeding: Needs assistance Is this a change from baseline?: Pre-admission baseline Bathing: Needs assistance Is this a change from baseline?: Pre-admission baseline Toileting: Needs assistance Is this a change from baseline?: Pre-admission baseline In/Out Bed: Needs assistance Is this a change from baseline?: Pre-admission baseline Walks in Home: Needs assistance Is this a change from baseline?: Pre-admission baseline Does the patient have difficulty walking or climbing stairs?: Yes Weakness of Legs: Both Weakness of Arms/Hands: Both  Permission Sought/Granted Permission sought to share information with : Facility Sport and exercise psychologist, Family Supports    Share Information with NAME: Zeke Aker  Permission granted to share info w AGENCY: [place] granted to share info w Relationship: Wife  Permission granted to share info w Contact Information: [phone]  Emotional Assessment Appearance:: Appears stated age Attitude/Demeanor/Rapport: Unable to Assess Affect (typically observed): Unable to Assess Orientation: : Oriented to Self Alcohol / Substance Use: Not Applicable Psych Involvement: No (comment)  Admission diagnosis:   CAP (community acquired pneumonia) [J18.9] Symptomatic anemia [D64.9] Hypotension, unspecified hypotension type [I95.9] Anemia, unspecified type [D64.9] Dyspnea, unspecified type [R06.00] Pneumonia due to infectious organism, unspecified laterality, unspecified part of lung [J18.9] Hematuria, unspecified type [R31.9] Cough, unspecified type [R05.9] Patient Active Problem List   Diagnosis Date Noted   Acute cystitis with hematuria 04/09/2022   CAP (community acquired pneumonia) 04/08/2022   Acute on chronic blood loss anemia 04/08/2022   Dyslipidemia 04/08/2022   Gout 04/08/2022   Wound dehiscence 01/15/2022   Protein-calorie malnutrition, severe 01/14/2022   Acute blood loss anemia 01/05/2022   Dementia without behavioral disturbance ([place]) 01/04/2022   Hypokalemia 01/04/2022   Acute renal failure superimposed on stage 4 chronic kidney disease ([place]) 01/03/2022   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[place]) 01/03/2022   Hyponatremia 35/59/7416   Metabolic acidosis 38/45/3646   Hematuria 01/03/2022   SBO (small bowel obstruction) ([place]) 01/03/2022   Gross hematuria    Right inguinal hernia 10/13/2019   Urinary tract infection 04/29/2018   AKI (acute kidney injury) ([place])    Other hydronephrosis    Urinary retention    ARF (acute renal failure) ([place]) 07/29/2017   Anemia 03/09/2014   CKD (chronic kidney disease), stage IV ([place]) 03/09/2014   Essential hypertension 03/09/2014   PCP:  [place]:   [place] [address] [place] [address] [facility] [place]) [place] [address] Phone: [phone] Fax: [phone]     Social Determinants  of Health (SDOH) Social History: SDOH Screenings   Food Insecurity: No Food Insecurity (04/09/2022)  Housing: Low Risk  (04/09/2022)  Transportation Needs: Unmet Transportation Needs (04/09/2022)  Utilities: Not At Risk (04/09/2022)  Financial Resource Strain: Low Risk   (04/28/2018)  Physical Activity: Inactive (04/28/2018)  Social Connections: Moderately Isolated (04/28/2018)  Stress: No Stress Concern Present (04/28/2018)  Tobacco Use: Medium Risk (04/07/2022)   SDOH Interventions:     Readmission Risk Interventions    04/10/2022    9:31 AM  Readmission Risk Prevention Plan  Transportation Screening Complete  PCP or Specialist Appt within 3-5 Days Complete  HRI or [place] Complete  Social Work Consult for [place] Planning/Counseling Complete  Palliative Care Screening Complete  Medication Review Press photographer) Complete

## 2022-04-10 NOTE — Progress Notes (Signed)
Initial Nutrition Assessment  DOCUMENTATION CODES:   Severe malnutrition in context of chronic illness  INTERVENTION:   Ensure Enlive po TID, each supplement provides 350 kcal and 20 grams of protein.  Magic cup TID with meals, each supplement provides 290 kcal and 9 grams of protein  MVI po daily   Liberalize diet- mechanical soft   Assist with meals  Pt at high refeed risk; recommend monitor potassium, magnesium and phosphorus labs daily until stable  NUTRITION DIAGNOSIS:   Severe Malnutrition related to chronic illness (dementia, CKD IV) as evidenced by severe muscle depletion, severe fat depletion.  GOAL:   Patient will meet greater than or equal to 90% of their needs  MONITOR:   PO intake, Supplement acceptance, Labs, Weight trends, Skin, I & O's  REASON FOR ASSESSMENT:   Consult Assessment of nutrition requirement/status  ASSESSMENT:   77 y/o male with h/o atonic bladder with chronic foley, CKD IV, dementia, HTN, HLD, gout  and recent admission for SBO s/p ex lap with reduction of internal hernia 47/8 complicated by wound dehiscence requiring reopening of laparotomy and closure of abdominal wound 10/6 who is now admitted with hyponatremia, CAP, hematuria and new renal mass.  Pt unable to provide any history. Pt is bed bound at baseline. Pt is well known to this RD from a recent previous admission. Pt with fair appetite and oral intake at baseline. Pt is documented to have eaten 100% of breakfast with meal assist; pt did not eat lunch. Pt does appear to be drinking some of the Ensure supplements. Per chart, pt appears weight stable pta. RD will add supplements and MVI to help pt meet his estimated needs. RD will change pt to a mechanical soft diet and request meal assistance. Pt is likely at refeed risk.    Medications reviewed and include: allopurinol, oscal w/ D, D3, ferrous sulfate, azithromycin, ceftriaxone, hypertonic saline  Labs reviewed: Na 126(L), K 5.1 wnl,  BUN 65(H), creat 3.44(H) BNP- 1834(H)- 1/2 Hgb 8.2(L), Hct 24.4(L)  NUTRITION - FOCUSED PHYSICAL EXAM:  Flowsheet Row Most Recent Value  Orbital Region Moderate depletion  Upper Arm Region Severe depletion  Thoracic and Lumbar Region Severe depletion  Buccal Region Moderate depletion  Temple Region Moderate depletion  Clavicle Bone Region Severe depletion  Clavicle and Acromion Bone Region Severe depletion  Scapular Bone Region Severe depletion  Dorsal Hand Severe depletion  Patellar Region Severe depletion  Anterior Thigh Region Severe depletion  Posterior Calf Region Severe depletion  Edema (RD Assessment) None  Hair Reviewed  Eyes Reviewed  Mouth Reviewed  Skin Reviewed  Nails Reviewed   Diet Order:   Diet Order             Diet Heart Room service appropriate? Yes; Fluid consistency: Thin  Diet effective now                  EDUCATION NEEDS:   No education needs have been identified at this time  Skin:  Skin Assessment: Reviewed RN Assessment  Last BM:  1/1  Height:   Ht Readings from Last 1 Encounters:  04/08/22 '5\' 8"'$  (1.727 m)    Weight:   Wt Readings from Last 1 Encounters:  04/08/22 67.1 kg    Ideal Body Weight:  70 kg  BMI:  Body mass index is 22.5 kg/m.  Estimated Nutritional Needs:   Kcal:  1800-2100kcal/day  Protein:  90-105g/day  Fluid:  1.7-2.0L/day  Koleen Distance MS, RD, LDN Please refer to [facility]  for RD and/or RD on-call/weekend/after hours pager

## 2022-04-10 NOTE — Progress Notes (Signed)
[facility]  ROUNDING NOTE   Subjective:   Troy Leaming. is a 77 y.o. male with multiple medical problems including atonic bladder status post suprapubic catheter, chronic kidney disease, anemia, hypertension, gout presented to the emergency room with coffee ground emesis and hematuria.  Patient has been admitted for CAP (community acquired pneumonia) [J18.9] Symptomatic anemia [D64.9] Hypotension, unspecified hypotension type [I95.9] Anemia, unspecified type [D64.9] Dyspnea, unspecified type [R06.00] Pneumonia due to infectious organism, unspecified laterality, unspecified part of lung [J18.9] Hematuria, unspecified type [R31.9] Cough, unspecified type [R05.9]  Patient is known to our practice from previous admissions.  Patient was followed in our office but appears to have been lost to follow-up.    Patient laying in bed Alert, garbled speech Breakfast tray at bedside, untouched   Objective:  Vital signs in last 24 hours:  Temp:  [97.3 F (36.3 C)-98.4 F (36.9 C)] 98.4 F (36.9 C) (01/04 0841) Pulse Rate:  [52-63] 60 (01/04 0841) Resp:  [12-18] 18 (01/04 0841) BP: (114-144)/(52-68) 144/60 (01/04 0841) SpO2:  [96 %-100 %] 100 % (01/04 0841)  Weight change:  Filed Weights   04/08/22 0228  Weight: 67.1 kg    Intake/Output: I/O last 3 completed shifts: In: 2071 [I.V.:1286.2; Other:70; IV Piggyback:714.8] Out: 8338 [[id number]]   Intake/Output this shift:  No intake/output data recorded.  Physical Exam: General: NAD  Head: Normocephalic, atraumatic. Moist oral mucosal membranes  Eyes: Anicteric  Lungs:  Diminished, normal effort, room air  Heart: Regular rate and rhythm  Abdomen:  Soft, nontender, suprapubic catheter  Extremities: No peripheral edema.  Neurologic: Nonfocal, moving all four extremities  Skin: No lesions  Access: None    Basic Metabolic Panel: Recent Labs  Lab 04/07/22 2258 04/08/22 0512 04/08/22 0933 04/09/22 1006  04/09/22 1816 04/09/22 2038 04/10/22 0017 04/10/22 0720 04/10/22 1107  NA 115* 118*   < > 121* 123* 121* 121* 125* 126*  K 4.7 4.8  --  4.8  --   --   --  5.1  --   CL 91* 95*  --  99  --   --   --  103  --   CO2 16* 15*  --  16*  --   --   --  13*  --   GLUCOSE 133* 118*  --  83  --   --   --  79  --   BUN 70* 61*  --  66*  --   --   --  65*  --   CREATININE 4.04* 3.66*  --  3.58*  --   --   --  3.44*  --   CALCIUM 8.9 8.6*  --  8.9  --   --   --  9.0  --    < > = values in this interval not displayed.     Liver Function Tests: Recent Labs  Lab 04/07/22 2258  AST 41  ALT 31  ALKPHOS 253*  BILITOT 0.6  PROT 5.2*  ALBUMIN 2.1*    No results for input(s): "LIPASE", "AMYLASE" in the last 168 hours. No results for input(s): "AMMONIA" in the last 168 hours.  CBC: Recent Labs  Lab 04/07/22 2258 04/08/22 0933 04/09/22 1316 04/10/22 0720  WBC 8.7 9.6 8.9 7.6  NEUTROABS  --   --  7.1  --   HGB 5.0* 8.2* 7.9* 8.2*  HCT 15.4* 24.3* 23.5* 24.4*  MCV 87.0 86.8 87.0 87.8  PLT 118* 125* 134* 147*  Cardiac Enzymes: No results for input(s): "CKTOTAL", "CKMB", "CKMBINDEX", "TROPONINI" in the last 168 hours.  BNP: Invalid input(s): "POCBNP"  CBG: No results for input(s): "GLUCAP" in the last 168 hours.  Microbiology: Results for orders placed or performed during the hospital encounter of 04/07/22  Resp panel by RT-PCR (RSV, Flu A&B, Covid) Anterior Nasal Swab     Status: None   Collection Time: 04/07/22 10:58 PM   Specimen: Anterior Nasal Swab  Result Value Ref Range Status   SARS Coronavirus 2 by RT PCR NEGATIVE NEGATIVE Final    Comment: (NOTE) SARS-CoV-2 target nucleic acids are NOT DETECTED.  The SARS-CoV-2 RNA is generally detectable in upper respiratory specimens during the acute phase of infection. The lowest concentration of SARS-CoV-2 viral copies this assay can detect is 138 copies/mL. A negative result does not preclude SARS-Cov-2 infection and  should not be used as the sole basis for treatment or other patient management decisions. A negative result may occur with  improper specimen collection/handling, submission of specimen other than nasopharyngeal swab, presence of viral mutation(s) within the areas targeted by this assay, and inadequate number of viral copies(<138 copies/mL). A negative result must be combined with clinical observations, patient history, and epidemiological information. The expected result is Negative.  Fact Sheet for Patients:  EntrepreneurPulse.com.au  Fact Sheet for Healthcare Providers:  IncredibleEmployment.be  This test is no t yet approved or cleared by the [place] FDA and  has been authorized for detection and/or diagnosis of SARS-CoV-2 by FDA under an Emergency Use Authorization (EUA). This EUA will remain  in effect (meaning this test can be used) for the duration of the COVID-19 declaration under Section 564(b)(1) of the Act, 21 U.S.C.section 360bbb-3(b)(1), unless the authorization is terminated  or revoked sooner.       Influenza A by PCR NEGATIVE NEGATIVE Final   Influenza B by PCR NEGATIVE NEGATIVE Final    Comment: (NOTE) The Xpert Xpress SARS-CoV-2/FLU/RSV plus assay is intended as an aid in the diagnosis of influenza from Nasopharyngeal swab specimens and should not be used as a sole basis for treatment. Nasal washings and aspirates are unacceptable for Xpert Xpress SARS-CoV-2/FLU/RSV testing.  Fact Sheet for Patients: EntrepreneurPulse.com.au  Fact Sheet for Healthcare Providers: IncredibleEmployment.be  This test is not yet approved or cleared by the [place] FDA and has been authorized for detection and/or diagnosis of SARS-CoV-2 by FDA under an Emergency Use Authorization (EUA). This EUA will remain in effect (meaning this test can be used) for the duration of the COVID-19 declaration  under Section 564(b)(1) of the Act, 21 U.S.C. section 360bbb-3(b)(1), unless the authorization is terminated or revoked.     Resp Syncytial Virus by PCR NEGATIVE NEGATIVE Final    Comment: (NOTE) Fact Sheet for Patients: EntrepreneurPulse.com.au  Fact Sheet for Healthcare Providers: IncredibleEmployment.be  This test is not yet approved or cleared by the [place] FDA and has been authorized for detection and/or diagnosis of SARS-CoV-2 by FDA under an Emergency Use Authorization (EUA). This EUA will remain in effect (meaning this test can be used) for the duration of the COVID-19 declaration under Section 564(b)(1) of the Act, 21 U.S.C. section 360bbb-3(b)(1), unless the authorization is terminated or revoked.  Performed at [facility], [address], [place], [place] [address]   Urine Culture     Status: Abnormal   Collection Time: 04/07/22 11:36 PM   Specimen: Urine, Clean Catch  Result Value Ref Range Status   Specimen Description   Final  URINE, CLEAN CATCH Performed at [facility], [address] [place], [place] [address]    Special Requests   Final    NONE Performed at [facility], [address] [place], [place] [address]    Culture >=100,000 COLONIES/mL KLEBSIELLA PNEUMONIAE (A)  Final   Report Status 04/10/2022 FINAL  Final   Organism ID, Bacteria KLEBSIELLA PNEUMONIAE (A)  Final      Susceptibility   Klebsiella pneumoniae - MIC*    AMPICILLIN >=32 RESISTANT Resistant     CEFAZOLIN <=4 SENSITIVE Sensitive     CEFEPIME <=0.12 SENSITIVE Sensitive     CEFTRIAXONE <=0.25 SENSITIVE Sensitive     CIPROFLOXACIN <=0.25 SENSITIVE Sensitive     GENTAMICIN <=1 SENSITIVE Sensitive     IMIPENEM <=0.25 SENSITIVE Sensitive     NITROFURANTOIN 64 INTERMEDIATE Intermediate     TRIMETH/SULFA >=320 RESISTANT Resistant     AMPICILLIN/SULBACTAM 4 SENSITIVE Sensitive     PIP/TAZO <=4 SENSITIVE Sensitive      * >=100,000 COLONIES/mL KLEBSIELLA PNEUMONIAE    Coagulation Studies: No results for input(s): "LABPROT", "INR" in the last 72 hours.  Urinalysis: Recent Labs    04/07/22 2336  COLORURINE RED*  LABSPEC 1.019  PHURINE TEST NOT REPORTED DUE TO COLOR INTERFERENCE OF URINE PIGMENT  GLUCOSEU TEST NOT REPORTED DUE TO COLOR INTERFERENCE OF URINE PIGMENT*  HGBUR TEST NOT REPORTED DUE TO COLOR INTERFERENCE OF URINE PIGMENT*  BILIRUBINUR TEST NOT REPORTED DUE TO COLOR INTERFERENCE OF URINE PIGMENT*  KETONESUR TEST NOT REPORTED DUE TO COLOR INTERFERENCE OF URINE PIGMENT*  PROTEINUR TEST NOT REPORTED DUE TO COLOR INTERFERENCE OF URINE PIGMENT*  NITRITE TEST NOT REPORTED DUE TO COLOR INTERFERENCE OF URINE PIGMENT*  LEUKOCYTESUR TEST NOT REPORTED DUE TO COLOR INTERFERENCE OF URINE PIGMENT*       Imaging: ECHOCARDIOGRAM COMPLETE  Result Date: 04/09/2022    ECHOCARDIOGRAM REPORT   Patient Name:   Troy Bochicchio. Date of Exam: 04/09/2022 Medical Rec #:  [id number]            Height:       68.0 in Accession #:    [id number]           Weight:       148.0 lb Date of Birth:  1945/06/04            BSA:          1.798 m Patient Age:    7 years             BP:           126/52 mmHg Patient Gender: M                    HR:           52 bpm. Exam Location:  [facility] Procedure: 2D Echo, Cardiac Doppler and Color Doppler Indications:     Abnormal ECG R94.31  History:         Patient has no prior history of Echocardiogram examinations.                  Risk Factors:Hypertension. CKD.  Sonographer:     Sherrie Sport Referring Phys:  [id number] [facility] AMIN Diagnosing Phys: Ida Rogue MD  Sonographer Comments: Suboptimal parasternal window. IMPRESSIONS  1. Challenging images  2. Left ventricular ejection fraction, by estimation, is 55 to 60%. The left ventricle has normal function. The left ventricle has no regional wall motion abnormalities. There is mild left  ventricular hypertrophy, unable to exclude moderate  hypertrophy of the inferior and lateral wall. Left ventricular diastolic parameters are consistent with Grade I diastolic dysfunction (impaired relaxation).  3. Right ventricular systolic function is mildly reduced, though not well visualized. The right ventricular size is normal. There is normal pulmonary artery systolic pressure. The estimated right ventricular systolic pressure is 56.3 mmHg.  4. Left atrial size was mild to moderately dilated.  5. The mitral valve is normal in structure. Moderate mitral valve regurgitation. No evidence of mitral stenosis.  6. The aortic valve is normal in structure. Aortic valve regurgitation is moderate to severe. No aortic stenosis is present.  7. There is severe dilatation of the aortic root, measuring 49 mm.Ascending aorta not well visulized.  8. The inferior vena cava is normal in size with greater than 50% respiratory variability, suggesting right atrial pressure of 3 mmHg. FINDINGS  Left Ventricle: Left ventricular ejection fraction, by estimation, is 55 to 60%. The left ventricle has normal function. The left ventricle has no regional wall motion abnormalities. The left ventricular internal cavity size was normal in size. There is  mild left ventricular hypertrophy. Left ventricular diastolic parameters are consistent with Grade I diastolic dysfunction (impaired relaxation). Right Ventricle: The right ventricular size is normal. No increase in right ventricular wall thickness. Right ventricular systolic function is mildly reduced. There is normal pulmonary artery systolic pressure. The tricuspid regurgitant velocity is 2.78 m/s, and with an assumed right atrial pressure of 5 mmHg, the estimated right ventricular systolic pressure is 14.9 mmHg. Left Atrium: Left atrial size was mild to moderately dilated. Right Atrium: Right atrial size was normal in size. Pericardium: There is no evidence of pericardial effusion. Mitral Valve: The mitral valve is normal in structure.  There is mild calcification of the mitral valve leaflet(s). Mild mitral annular calcification. Moderate mitral valve regurgitation. No evidence of mitral valve stenosis. Tricuspid Valve: The tricuspid valve is normal in structure. Tricuspid valve regurgitation is not demonstrated. No evidence of tricuspid stenosis. Aortic Valve: The aortic valve is normal in structure. Aortic valve regurgitation is moderate to severe. Aortic regurgitation PHT measures 433 msec. No aortic stenosis is present. Aortic valve mean gradient measures 4.0 mmHg. Aortic valve peak gradient measures 6.7 mmHg. Aortic valve area, by VTI measures 2.68 cm. Pulmonic Valve: The pulmonic valve was normal in structure. Pulmonic valve regurgitation is not visualized. No evidence of pulmonic stenosis. Aorta: The aortic root is normal in size and structure. There is severe dilatation of the aortic root, measuring 49 mm. Venous: The inferior vena cava is normal in size with greater than 50% respiratory variability, suggesting right atrial pressure of 3 mmHg. IAS/Shunts: No atrial level shunt detected by color flow Doppler.  LEFT VENTRICLE PLAX 2D LVIDd:         4.90 cm   Diastology LVIDs:         3.50 cm   LV e' medial:    5.22 cm/s LV PW:         1.10 cm   LV E/e' medial:  14.9 LV IVS:        1.20 cm   LV e' lateral:   10.80 cm/s LVOT diam:     2.10 cm   LV E/e' lateral: 7.2 LV SV:         81 LV SV Index:   45 LVOT Area:     3.46 cm  RIGHT VENTRICLE RV Basal diam:  2.50 cm RV Mid diam:  3.10 cm RV S prime:     15.90 cm/s TAPSE (M-mode): 1.9 cm LEFT ATRIUM           Index        RIGHT ATRIUM           Index LA diam:      4.10 cm 2.28 cm/m   RA Area:     10.80 cm LA Vol (A4C): 76.2 ml 42.37 ml/m  RA Volume:   15.90 ml  8.84 ml/m  AORTIC VALVE AV Area (Vmax):    3.52 cm AV Area (Vmean):   2.98 cm AV Area (VTI):     2.68 cm AV Vmax:           129.00 cm/s AV Vmean:          95.200 cm/s AV VTI:            0.301 m AV Peak Grad:      6.7 mmHg AV Mean  Grad:      4.0 mmHg LVOT Vmax:         131.00 cm/s LVOT Vmean:        81.800 cm/s LVOT VTI:          0.233 m LVOT/AV VTI ratio: 0.77 AI PHT:            433 msec  AORTA Ao Root diam: 5.07 cm MITRAL VALVE                TRICUSPID VALVE MV Area (PHT): 2.70 cm     TR Peak grad:   30.9 mmHg MV Decel Time: 281 msec     TR Vmax:        278.00 cm/s MV E velocity: 77.60 cm/s MV A velocity: 123.00 cm/s  SHUNTS MV E/A ratio:  0.63         Systemic VTI:  0.23 m                             Systemic Diam: 2.10 cm Ida Rogue MD Electronically signed by Ida Rogue MD Signature Date/Time: 04/09/2022/12:44:09 PM    Final      Medications:    sodium chloride Stopped (04/08/22 0054)   azithromycin Stopped (04/10/22 0216)   cefTRIAXone (ROCEPHIN)  IV Stopped (04/10/22 0015)   sodium chloride (hypertonic) 30 mL/hr at 04/10/22 0855    allopurinol  300 mg Oral Daily   amLODipine  10 mg Oral Daily   calcium-vitamin D  1 tablet Oral Daily   cholecalciferol  1,000 Units Oral Daily   feeding supplement  237 mL Oral TID BM   ferrous sulfate  325 mg Oral Daily   finasteride  5 mg Oral Daily   pravastatin  40 mg Oral q1800   acetaminophen **OR** acetaminophen, magnesium hydroxide, ondansetron **OR** ondansetron (ZOFRAN) IV, polyethylene glycol, traZODone  Assessment/ Plan:  [name] is a 77 y.o.  male with multiple medical problems including atonic bladder status post suprapubic catheter, chronic kidney disease, anemia, hypertension, gout presented to the emergency room with hematuria and abdominal distention.  Severe hyponatremia, sodium 115 on ED arrival.  Sodium 125 today, continue 3% hypertonic saline at prescribed rate. Frequent sodium checks.  2. Anemia with acute blood loss:   Lab Results  Component Value Date   HGB 8.2 (L) 04/10/2022    Hgb has improved since admission. Patient has received 2 unit blood transfusion. Urology consulted and hematuria remains  in catheter. Believed due to  renal mass, not a surgical candidate. Recommend palliative care for [place] discussion.   3. Chronic kidney disease stage 4. Remains at baseline creatinine. Stable   LOS: 2   1/4/202412:43 PM

## 2022-04-10 NOTE — Assessment & Plan Note (Signed)
Suspect underlying dementia

## 2022-04-10 NOTE — Progress Notes (Signed)
Progress Note   Patient: Troy Berry. FHQ:[id number] DOB: October 09, 1945 DOA: 04/07/2022     2 DOS: the patient was seen and examined on 04/10/2022   Brief hospital course: Taken from H&P.  [name] is a 77 y.o. African-American male with medical history significant for stage III CKD, GERD, hypertension anemia, and dementia who presented to the emergency room with acute onset of hematuria in the setting of a suprapubic catheter along with blood around the stools per his wife discovered in the diaper.  He has a history of ongoing gross hematuria.  No melena was reported.  No nausea or vomiting.  ED course.  On arrival stable vitals, respiratory panel negative.  Pertinent labs with hyponatremia with sodium of 115, do not from 132 on 49/82/6415, metabolic acidosis with bicarb of 16 and chloride of 91, worsening renal function with BUN 70 and creatinine of 4.04 as compared to 49/3.97 on 12/25.  Lactic acid at 2>> 1.5.  CBC with hemoglobin 5, it was 8 on 12/25. EKG is sinus rhythm, prolonged PR interval and chronic Q waves inferiorly. CT chest, abdomen and pelvis with 1. Cardiomegaly with small bilateral pleural effusions. Small heterogeneous foci of ground-glass density in the left upper lobe and left lower lobe suspicious for pneumonia. 2. Increased density in the region of left renal collecting system and within the left ureter which appears dilated. Findings could be secondary to hemorrhage or infection. There are small left kidney stones. 3. Suspicion of hyperdense renal lesions superior pole left kidney, further assessment difficult without contrast, correlation with renal ultrasound could be attempted. 4. Small volume perihepatic ascites. Generalized mesenteric and subcutaneous edema suggesting anasarca. 5. Enlarged prostate with mass effect on the bladder. 6. Redemonstrated enlarged appearing left retroperitoneal lymph nodes which are either reactive or neoplastic in  etiology. 7. Large fluid collection within right groin hernia. 8. Gallstones. 9. Aortic atherosclerosis.  Patient received IV fluid with normal saline, Rocephin and Zithromax for concern of And blood transfusions were ordered.  1/2: Vitals stable, sodium improved to 118.  TSH within normal limit.  Renal function with some improvement to 61/3.66.  Urology was consulted.  Hemoglobin increased to 8.2 after getting 2 unit of PRBC.  Patient clinically appears dry to euvolemic but BNP at 1834 , markedly elevated from prior check of 395 19-monthago.  No history of heart failure or prior echocardiogram.  Echocardiogram ordered. Sodium slowly increasing, nephrology was also consulted as we might not be able to give him a lot of fluid for concern of heart failure with elevated BNP.  Per urology concern of renal mass/urothelial carcinoma but he is not a candidate for any intervention based on poor functional status.  They are recommending palliative care consult which was ordered..   Assessment and Plan: * Acute on chronic blood loss anemia Patient was transfused 2 units of packed red blood cells and hemoglobin came up to 8.1.  Most recent hemoglobin 8.2.  Likely from hemorrhagic renal cyst versus mass.  IV iron given on 04/09/2022.  Will give another dose on 04/11/2022.  Will need referral to hematology as outpatient for iron infusions and possible future transfusions.  CAP (community acquired pneumonia) On Rocephin and Zithromax  Hyponatremia Last sodium 126 up from 115.  Patient on low-dose hypertonic saline.  Essential hypertension Hold Coreg with bradycardia.  Memory loss Suspect underlying dementia  Acute cystitis with hematuria Catheter related.  Klebsiella growing out of urine culture.  Sensitive to Rocephin.  Secondary  to patient having a catheter, will have to give a longer course of antibiotics.  Gout Continue allopurinol.  Dyslipidemia Continue Statin therapy.  Protein-calorie  malnutrition, severe Continue supplements  CKD (chronic kidney disease), stage IV (HCC) Last creatinine 3.44 with a GFR of 18        Subjective: Patient answers a few questions.  Does not offer any complaints.  Still had blood in his urine this morning.  Physical Exam: Vitals:   04/09/22 2017 04/09/22 2045 04/10/22 0452 04/10/22 0841  BP: (!) 114/58  120/68 (!) 144/60  Pulse: (!) 57  63 60  Resp: '14  16 18  '$ Temp:  (!) 97.3 F (36.3 C)  98.4 F (36.9 C)  TempSrc:  Oral    SpO2: 100%  100% 100%  Weight:      Height:       Physical Exam HENT:     Head: Normocephalic.     Mouth/Throat:     Pharynx: No oropharyngeal exudate.  Eyes:     General: Lids are normal.  Cardiovascular:     Rate and Rhythm: Normal rate and regular rhythm.     Heart sounds: Normal heart sounds, S1 normal and S2 normal.  Pulmonary:     Breath sounds: No decreased breath sounds, wheezing, rhonchi or rales.  Abdominal:     Palpations: Abdomen is soft.     Tenderness: There is no abdominal tenderness.  Musculoskeletal:     Right lower leg: No swelling.     Left lower leg: No swelling.  Skin:    General: Skin is warm.     Findings: No rash.  Neurological:     Mental Status: He is alert.     Data Reviewed: Last sodium 126, creatinine 3.44, potassium 5.1, hemoglobin 8.2, platelet count 147  Family Communication: Updated patient's wife on the phone  Disposition: Status is: Inpatient Remains inpatient appropriate because: On hypertonic saline to improve sodium.  Planned Discharge Destination: Home with Home Health    Time spent: 28 minutes  Author: Loletha Grayer, MD 04/10/2022 3:20 PM  For on call review [email or web address].

## 2022-04-10 NOTE — Consult Note (Signed)
Consultation Note Date: 04/10/2022   [name].  DOB: Aug 13, 1945  MRN: [id number]  Age / Sex: 77 y.o., male  PCP: [facility] [place] Referring Physician: Loletha Grayer, MD  Reason for Consultation: Establishing goals of care  HPI/Patient Profile: Troy Berry. is a 77 y.o. African-American male with medical history significant for stage III CKD, GERD, hypertension anemia, and dementia who presented to the emergency room with acute onset of hematuria in the setting of a suprapubic catheter along with blood around the stools per his wife discovered in the diaper.  He has a history of ongoing gross hematuria.  No melena was reported.  No nausea or vomiting.   Clinical Assessment and Goals of Care: Notes and labs reviewed. Patient is sitting in bed, no family at bedside. He is awake and looks at me, but does not speak.   Called wife. She states prior to going to rehab a month ago, he was ambulatory, and able to complete ADLS'. She states since discharge from there, he does not want and she has to assist with all care.    She states he has never been a big talker. She states although he does not really speak, he is oriented and answers questions selectively, depending on who asks. She states he will do things for their daughter he will not do for her. She states he will tell you what kinds of eggs he wants, what he wants to drink, and sometimes speaks complete sentences, but she has to push him hard to get him to speak.   We discussed his diagnosis, prognosis, GOC, EOL wishes disposition and options.  Created space and opportunity for patient  to explore thoughts and feelings regarding current medical information.   A detailed discussion was had today regarding advanced directives.  Concepts specific to code status, artifical feeding and hydration, IV antibiotics and  rehospitalization were discussed.  The difference between an aggressive medical intervention path and a comfort care path was discussed.  Values and goals of care important to patient and family were attempted to be elicited.  Discussed limitations of medical interventions to prolong quality of life in some situations and discussed the concept of human mortality.  She states he has had a feeding tube previously, and she would never want this for him again, but states she would want full code/ full scope outside of this.   SUMMARY OF RECOMMENDATIONS   Full code/ full scope except feeding tube.          Primary Diagnoses: Present on Admission:  CAP (community acquired pneumonia)  Hyponatremia  Essential hypertension  CKD (chronic kidney disease), stage IV ([place])   I have reviewed the medical record, interviewed the patient and family, and examined the patient. The following aspects are pertinent.  Past Medical History:  Diagnosis Date   Anemia    CKD (chronic kidney disease), stage III ([place])    Gout    Hypertension    Urinary retention    in-dwelling  Foley since Nov 2019, sees Dr. Hollice Espy   Social History   Socioeconomic History   Marital status: Married    Spouse name: Not on file   Number of children: Not on file   Years of education: Not on file   Highest education level: Not on file  Occupational History   Not on file  Tobacco Use   Smoking status: Former    Types: Cigarettes    Quit date: 04/07/1998    Years since quitting: 24.0    Passive exposure: Past   Smokeless tobacco: Never  Vaping Use   Vaping Use: Never used  Substance and Sexual Activity   Alcohol use: Yes   Drug use: Never   Sexual activity: Not Currently    Birth control/protection: None  Other Topics Concern   Not on file  Social History Narrative   Lives at home with family   Social Determinants of Health   Financial Resource Strain: Low Risk  (04/28/2018)   Overall Financial  Resource Strain (CARDIA)    Difficulty of Paying Living Expenses: Not hard at all  Food Insecurity: No Food Insecurity (04/09/2022)   Hunger Vital Sign    Worried About Running Out of Food in the Last Year: Never true    [place] in the Last Year: Never true  Transportation Needs: Unmet Transportation Needs (04/09/2022)   PRAPARE - Hydrologist (Medical): Yes    Lack of Transportation (Non-Medical): No  Physical Activity: Inactive (04/28/2018)   Exercise Vital Sign    Days of Exercise per Week: 0 days    Minutes of Exercise per Session: 0 min  Stress: No Stress Concern Present (04/28/2018)   [place]    Feeling of Stress : Not at all  Social Connections: Moderately Isolated (04/28/2018)   Social Connection and Isolation Panel [NHANES]    Frequency of Communication with Friends and Family: Once a week    Frequency of Social Gatherings with Friends and Family: Once a week    Attends Religious Services: Never    Marine scientist or Organizations: No    Attends Music therapist: Never    Marital Status: Married   Family History  Problem Relation Age of Onset   Cancer Father    Scheduled Meds:  allopurinol  300 mg Oral Daily   amLODipine  10 mg Oral Daily   calcium-vitamin D  1 tablet Oral Daily   cholecalciferol  1,000 Units Oral Daily   feeding supplement  237 mL Oral TID BM   ferrous sulfate  325 mg Oral Daily   finasteride  5 mg Oral Daily   [START ON 04/11/2022] multivitamin with minerals  1 tablet Oral Daily   pravastatin  40 mg Oral q1800   Continuous Infusions:  sodium chloride Stopped (04/08/22 0054)   azithromycin Stopped (04/10/22 0216)   cefTRIAXone (ROCEPHIN)  IV Stopped (04/10/22 0015)   sodium chloride (hypertonic) 30 mL/hr at 04/10/22 0855   PRN Meds:.acetaminophen **OR** acetaminophen, magnesium hydroxide, ondansetron **OR** ondansetron (ZOFRAN)  IV, polyethylene glycol, traZODone Medications Prior to Admission:  Prior to Admission medications   Medication Sig Start Date End Date Taking? Authorizing Provider  allopurinol (ZYLOPRIM) 300 MG tablet Take 300 mg by mouth daily. 12/08/17  Yes [provider]  amLODipine (NORVASC) 10 MG tablet Take 10 mg by mouth daily.   Yes [provider]  calcium citrate-vitamin D (CITRACAL+D)  315-200 MG-UNIT tablet Take 1 tablet by mouth daily.   Yes [provider]  carvedilol (COREG) 25 MG tablet Take 25 mg by mouth 2 (two) times daily with a meal.  05/11/18  Yes [provider]  cholecalciferol (VITAMIN D3) 25 MCG (1000 UT) tablet Take 1,000 Units by mouth daily.   Yes [provider]  CRANBERRY-CALCIUM PO Take 1 capsule by mouth daily.   Yes [provider]  FEROSUL 325 (65 Fe) MG tablet Take 325 mg by mouth daily. 12/21/21  Yes [provider]  finasteride (PROSCAR) 5 MG tablet Take 1 tablet (5 mg total) by mouth daily. 04/03/22  Yes Vaillancourt, Samantha, PA-C  furosemide (LASIX) 40 MG tablet Take 40 mg by mouth daily. 07/16/20  Yes [provider]  lovastatin (MEVACOR) 40 MG tablet Take 1 tablet by mouth every evening. 07/16/17  Yes [provider]  acetaminophen (TYLENOL) 325 MG tablet Take 2 tablets (650 mg total) by mouth every 6 (six) hours as needed for mild pain (or Fever >/= 101). 04/30/18   Vaughan Basta, MD  polyethylene glycol (MIRALAX) 17 g packet Take 17 g by mouth daily. 08/30/18   Lavonia Drafts, MD  sulfamethoxazole-trimethoprim (BACTRIM DS) 800-160 MG tablet Take 1 tablet by mouth 2 (two) times daily. Patient not taking: Reported on 04/07/2022 03/17/22   Carrie Mew, MD   Allergies  Allergen Reactions   Ace Inhibitors     Other reaction(s): Other (See Comments) lip swelling   Review of Systems  Unable to perform ROS   Physical Exam Pulmonary:     Effort: Pulmonary effort is normal.   Neurological:     Mental Status: He is alert.     Vital Signs: BP (!) 144/60 (BP Location: Right Arm)   Pulse 60   Temp 98.4 F (36.9 C)   Resp 18   Ht '5\' 8"'$  (1.727 m)   Wt 67.1 kg   SpO2 100%   BMI 22.50 kg/m  Pain Scale: 0-10   Pain Score: 0-No pain   SpO2: SpO2: 100 % O2 Device:SpO2: 100 % O2 Flow Rate: .   IO: Intake/output summary:  Intake/Output Summary (Last 24 hours) at 04/10/2022 1503 Last data filed at 04/10/2022 1400 Gross per 24 hour  Intake 720.96 ml  Output 550 ml  Net 170.96 ml    LBM: Last BM Date : 04/07/22 Baseline Weight: Weight: 67.1 kg Most recent weight: Weight: 67.1 kg      Signed by: Asencion Gowda, NP   Please contact Palliative Medicine Team phone at [phone] for questions and concerns.  For individual provider: See Shea Evans

## 2022-04-11 ENCOUNTER — Inpatient Hospital Stay: Payer: Medicare HMO

## 2022-04-11 DIAGNOSIS — N2889 Other specified disorders of kidney and ureter: Secondary | ICD-10-CM | POA: Diagnosis not present

## 2022-04-11 DIAGNOSIS — J189 Pneumonia, unspecified organism: Secondary | ICD-10-CM | POA: Diagnosis not present

## 2022-04-11 DIAGNOSIS — D62 Acute posthemorrhagic anemia: Secondary | ICD-10-CM | POA: Diagnosis not present

## 2022-04-11 DIAGNOSIS — E871 Hypo-osmolality and hyponatremia: Secondary | ICD-10-CM | POA: Diagnosis not present

## 2022-04-11 LAB — BASIC METABOLIC PANEL
Anion gap: 6 (ref 5–15)
BUN: 68 mg/dL — ABNORMAL HIGH (ref 8–23)
CO2: 16 mmol/L — ABNORMAL LOW (ref 22–32)
Calcium: 9.1 mg/dL (ref 8.9–10.3)
Chloride: 107 mmol/L (ref 98–111)
Creatinine, Ser: 3.32 mg/dL — ABNORMAL HIGH (ref 0.61–1.24)
GFR, Estimated: 18 mL/min — ABNORMAL LOW (ref >60)
Glucose, Bld: 111 mg/dL — ABNORMAL HIGH (ref 70–99)
Potassium: 4.7 mmol/L (ref 3.5–5.1)
Sodium: 129 mmol/L — ABNORMAL LOW (ref 135–145)

## 2022-04-11 LAB — SODIUM
Sodium: 130 mmol/L — ABNORMAL LOW (ref 135–145)
Sodium: 131 mmol/L — ABNORMAL LOW (ref 135–145)
Sodium: 132 mmol/L — ABNORMAL LOW (ref 135–145)

## 2022-04-11 LAB — HEMOGLOBIN
Hemoglobin: 7 g/dL — ABNORMAL LOW (ref 13.0–17.0)
Hemoglobin: 7.5 g/dL — ABNORMAL LOW (ref 13.0–17.0)

## 2022-04-11 LAB — PREPARE RBC (CROSSMATCH)

## 2022-04-11 MED ORDER — SODIUM CHLORIDE 1 G PO TABS
1.0000 g | ORAL_TABLET | Freq: Three times a day (TID) | ORAL | Status: DC
  Administered 2022-04-11 – 2022-04-14 (×10): 1 g via ORAL
  Filled 2022-04-11 (×10): qty 1

## 2022-04-11 MED ORDER — CHLORHEXIDINE GLUCONATE CLOTH 2 % EX PADS
6.0000 | MEDICATED_PAD | Freq: Every day | CUTANEOUS | Status: DC
  Administered 2022-04-11 – 2022-04-14 (×4): 6 via TOPICAL

## 2022-04-11 MED ORDER — ACETAMINOPHEN 325 MG PO TABS
650.0000 mg | ORAL_TABLET | Freq: Once | ORAL | Status: AC
  Administered 2022-04-11: 650 mg via ORAL
  Filled 2022-04-11: qty 2

## 2022-04-11 MED ORDER — AZITHROMYCIN 250 MG PO TABS
500.0000 mg | ORAL_TABLET | Freq: Every day | ORAL | Status: DC
  Administered 2022-04-11 – 2022-04-13 (×3): 500 mg via ORAL
  Filled 2022-04-11 (×3): qty 2

## 2022-04-11 MED ORDER — SODIUM CHLORIDE 0.9% IV SOLUTION: Freq: Once | INTRAVENOUS | Status: AC

## 2022-04-11 NOTE — Plan of Care (Signed)
  Problem: Activity: Goal: Ability to tolerate increased activity will improve Outcome: Progressing   Problem: Clinical Measurements: Goal: Ability to maintain clinical measurements within normal limits will improve Outcome: Progressing   Problem: Elimination: Goal: Will not experience complications related to bowel motility Outcome: Progressing Goal: Will not experience complications related to urinary retention Outcome: Progressing   Problem: Pain Managment: Goal: General experience of comfort will improve Outcome: Progressing   Problem: Safety: Goal: Ability to remain free from injury will improve Outcome: Progressing

## 2022-04-11 NOTE — Assessment & Plan Note (Addendum)
MRI of the abdomen shows a left kidney mass, liver metastases and bone metastases and retroperitoneal lymphadenopathy.  Case discussed with Dr. Donneta Romberg and patient is not a good candidate for systemic treatment and recommended hospice.  Family has decided on hospice home

## 2022-04-11 NOTE — Progress Notes (Signed)
Brief Urology Inpatient Progress Note  -Patient's hemoglobin dropped again overnight, this morning 7.0.  Transfusion pending. -Palliative care spoke with [name] yesterday, who wishes to continue full code and scope of treatment, with the exception of a feeding tube. -I spoke with Dr. Jacalyn Lefevre in radiology today via telephone and we reviewed his recent scans. He feels that his left retroperitoneal lymph nodes are unlikely to be reactive due to their size. He recommends an MR abdomen with or without contrast to better characterize his left kidney and retroperitoneal lymphadenopathy. We specifically discussed that a noncontrast scan would be faster to perform, so the patient may better tolerate it with his dementia, and it would be less subject to motion artifact.  We remain in an extremely challenging situation with this patient. Unfortunately, despite wishes to continue full scope of treatment, that scope is extremely narrow at this point (e.g. transfuse as needed, irrigate Foley). To that end, with pursuing a definitive diagnosis as our next step, highest on our differential is a left renal malignancy (TCC vs RCC). In this case, his left retroperitoneal lymphadenopathy would likely represent metastatic disease.   Urine cytology has been sent today and we await those results, however as previously noted, a negative cytology would not rule out either diagnosis. I've ordered an MR abdomen without contrast per my conversation with Dr. Jacalyn Lefevre as above. If the retroperitoneal lymphadenopathy appears accessible on this scan, one diagnostic option would be to pursue percutaneous biopsy with IR; otherwise, could consider cystoscopy with left retrograde pyelogram and biopsy in the OR next week. Ultimately, however, if renal malignancy is confirmed, there are limited to no treatment options to offer this comorbid patient. We continue to recommend a palliative approach.  Troy Loop, PA-C 04/11/2022

## 2022-04-11 NOTE — Consult Note (Signed)
PHARMACY CONSULT NOTE - Hypertonic Saline  Pharmacy Consult for Hypertonic Saline Monitoring and Management  Recent Labs: Potassium (mmol/L)  Date Value  04/11/2022 4.7  09/26/2011 3.0 (L)   Magnesium (mg/dL)  Date Value  01/30/2022 2.2   Calcium (mg/dL)  Date Value  04/11/2022 9.1   Calcium, Total (mg/dL)  Date Value  09/26/2011 9.2   Albumin (g/dL)  Date Value  04/07/2022 2.1 (L)  11/24/2017 3.4 (L)   Phosphorus (mg/dL)  Date Value  01/30/2022 4.1   Sodium (mmol/L)  Date Value  04/11/2022 130 (L)  11/24/2017 141  09/26/2011 136    Assessment  Troy Berry. is a 77 y.o. male presenting with hematuria. PMH significant for CKD4, HTN, anemia, dementia. Patient initially received NS infusion for Na correction with minimal improvement (115 >> 121). Pharmacy has been consulted to monitor and and manage hypertonic saline (3%).  Baseline Labs: Serum Na 119, Serum Osm 278  Goal of Therapy:  Increase in Na by 4-6 mEq/L in 4-6 hours Do not exceed increase in Na by 10-12 mEq/L in 24 hours  Monitoring:  Date Time Na Rate (mL/hr)  1/4 0017 121 30 1/4 0720 125 30 1/4 1107 126 30 1/4 1455 127 30 1/4 1937 128 30 1/4       2253    127     30 1/5       0235    129     30 1/5 0709 130 30 Plan:  Initiate 3% saline at 30 mL/hr Check Na Q4H  Stop infusion if  Na increases > 6 mEq/L in 6 hours  Na increases > 8 mEq/L in 8 hours (give D5W bolus) Continue to monitor for signs of clinical improvement and recommendations per nephrology  Lorin Picket, PharmD 04/11/2022 7:44 AM

## 2022-04-11 NOTE — Plan of Care (Signed)
  Problem: Activity: Goal: Ability to tolerate increased activity will improve 04/11/2022 0038 by Teodoro Spray, RN Outcome: Progressing 04/10/2022 2044 by Teodoro Spray, RN Outcome: Progressing

## 2022-04-11 NOTE — Progress Notes (Signed)
PHARMACIST - PHYSICIAN COMMUNICATION DR:   Leslye Peer CONCERNING: Antibiotic IV to Oral Route Change Policy  RECOMMENDATION: This patient is receiving azithromycin by the intravenous route.  Based on criteria approved by the Pharmacy and Therapeutics Committee, the antibiotic(s) is/are being converted to the equivalent oral dose form(s).   DESCRIPTION: These criteria include: Patient being treated for a respiratory tract infection, urinary tract infection, cellulitis or clostridium difficile associated diarrhea if on metronidazole The patient is not neutropenic and does not exhibit a GI malabsorption state The patient is eating (either orally or via tube) and/or has been taking other orally administered medications for a least 24 hours The patient is improving clinically and has a Tmax < 100.5  If you have questions about this conversion, please contact the Pharmacy Department  '[x]'$   [phone] )  [facility]

## 2022-04-11 NOTE — TOC Progression Note (Signed)
Transition of Care (TOC) - Progression Note    Patient Details  Name: Troy Berry. MRN: [id number] Date of Birth: September 08, 1945  Transition of Care [facility]) CM/SW Contact  Candie Chroman, LCSW Phone Number: 04/11/2022, 1:49 PM  Clinical Narrative: [employer] liaison told daughter that it would be cheaper for her to get incontinence supplies through them than at the store. Per [employer] liaison that we speak to this was incorrect. She will ask that liaison to call daughter back to let her know. Per [employer] liaison, in-home PCP can accept patient but it will be a couple of weeks before she can schedule. Asked [facility] liaison to notify daughter.   Expected Discharge Plan: [place] Barriers to Discharge: Continued Medical Work up  Expected Discharge Plan and Services     Post Acute Care Choice: Resumption of Svcs/PTA Provider Living arrangements for the past 2 months: Single Family Home                           HH Arranged: RN, PT, OT, Nurse's Aide, Social Work [employer] Agency: [place] Date [place]: 04/10/22   Representative spoke with at [place]: [place] ([address]) Interventions SDOH Screenings   Food Insecurity: No Food Insecurity (04/09/2022)  Housing: Low Risk  (04/09/2022)  Transportation Needs: Unmet Transportation Needs (04/09/2022)  Utilities: Not At Risk (04/09/2022)  Financial Resource Strain: Low Risk  (04/28/2018)  Physical Activity: Inactive (04/28/2018)  Social Connections: Moderately Isolated (04/28/2018)  Stress: No Stress Concern Present (04/28/2018)  Tobacco Use: Medium Risk (04/07/2022)    Readmission Risk Interventions    04/10/2022    9:31 AM  Readmission Risk Prevention Plan  Transportation Screening Complete  PCP or Specialist Appt within 3-5 Days Complete  HRI or [place] Complete  Social Work Consult for [place] Planning/Counseling Complete  Palliative Care  Screening Complete  Medication Review Press photographer) Complete

## 2022-04-11 NOTE — Consult Note (Signed)
PHARMACY CONSULT NOTE - Hypertonic Saline  Pharmacy Consult for Hypertonic Saline Monitoring and Management  Recent Labs: Potassium (mmol/L)  Date Value  04/11/2022 4.7  09/26/2011 3.0 (L)   Magnesium (mg/dL)  Date Value  01/30/2022 2.2   Calcium (mg/dL)  Date Value  04/11/2022 9.1   Calcium, Total (mg/dL)  Date Value  09/26/2011 9.2   Albumin (g/dL)  Date Value  04/07/2022 2.1 (L)  11/24/2017 3.4 (L)   Phosphorus (mg/dL)  Date Value  01/30/2022 4.1   Sodium (mmol/L)  Date Value  04/11/2022 129 (L)  11/24/2017 141  09/26/2011 136    Assessment  Troy Berry. is a 77 y.o. male presenting with hematuria. PMH significant for CKD4, HTN, anemia, dementia. Patient initially received NS infusion for Na correction with minimal improvement (115 >> 121). Pharmacy has been consulted to monitor and and manage hypertonic saline (3%).  Baseline Labs: Serum Na 119, Serum Osm 278  Goal of Therapy:  Increase in Na by 4-6 mEq/L in 4-6 hours Do not exceed increase in Na by 10-12 mEq/L in 24 hours  Monitoring:  Date Time Na Rate (mL/hr)  1/3 1816 123 30 1/4 0017 121 30 1/4 0720 125 30 1/4 1107 126 30 1/4 1455 127 30 1/4 1937 128 30 1/4       2253    127     30 1/5       0235    129     30  Plan:  Initiate 3% saline at 30 mL/hr Check Na Q4H  Stop infusion if  Na increases > 6 mEq/L in 6 hours  Na increases > 8 mEq/L in 8 hours (give D5W bolus) Continue to monitor for signs of clinical improvement and recommendations per nephrology  [name] 04/11/2022 3:50 AM

## 2022-04-11 NOTE — Care Management Important Message (Signed)
Important Message  Patient Details  Name: Troy Berry. MRN: [id number] Date of Birth: 14-Jul-1945   Medicare Important Message Given:  Yes  Left voice message for the patient's daughter, Dineen Kid ([phone]) to call me at her convenience so I could review the Important Message from Medicare. Will await a return call.    Juliann Pulse A Kimeka Badour 04/11/2022, 2:42 PM

## 2022-04-11 NOTE — Care Management Important Message (Signed)
Important Message  Patient Details  Name: Troy Berry. MRN: [id number] Date of Birth: 10/14/1945   Medicare Important Message Given:  Other (see comment)     [name] 04/11/2022, 2:42 PM

## 2022-04-11 NOTE — Progress Notes (Signed)
Progress Note   Patient: Troy Berry. INO:[id number] DOB: 08-25-1945 DOA: 04/07/2022     3 DOS: the patient was seen and examined on 04/11/2022   Brief hospital course: Taken from H&P.  [name] is a 77 y.o. African-American male with medical history significant for stage III CKD, GERD, hypertension anemia, and dementia who presented to the emergency room with acute onset of hematuria in the setting of a suprapubic catheter along with blood around the stools per his wife discovered in the diaper.  He has a history of ongoing gross hematuria.  No melena was reported.  No nausea or vomiting.  ED course.  On arrival stable vitals, respiratory panel negative.  Pertinent labs with hyponatremia with sodium of 115, do not from 132 on 09/62/8366, metabolic acidosis with bicarb of 16 and chloride of 91, worsening renal function with BUN 70 and creatinine of 4.04 as compared to 49/3.97 on 12/25.  Lactic acid at 2>> 1.5.  CBC with hemoglobin 5, it was 8 on 12/25. EKG is sinus rhythm, prolonged PR interval and chronic Q waves inferiorly. CT chest, abdomen and pelvis with 1. Cardiomegaly with small bilateral pleural effusions. Small heterogeneous foci of ground-glass density in the left upper lobe and left lower lobe suspicious for pneumonia. 2. Increased density in the region of left renal collecting system and within the left ureter which appears dilated. Findings could be secondary to hemorrhage or infection. There are small left kidney stones. 3. Suspicion of hyperdense renal lesions superior pole left kidney, further assessment difficult without contrast, correlation with renal ultrasound could be attempted. 4. Small volume perihepatic ascites. Generalized mesenteric and subcutaneous edema suggesting anasarca. 5. Enlarged prostate with mass effect on the bladder. 6. Redemonstrated enlarged appearing left retroperitoneal lymph nodes which are either reactive or neoplastic in  etiology. 7. Large fluid collection within right groin hernia. 8. Gallstones. 9. Aortic atherosclerosis.  Patient received IV fluid with normal saline, Rocephin and Zithromax for concern of And blood transfusions were ordered.  1/2: Vitals stable, sodium improved to 118.  TSH within normal limit.  Renal function with some improvement to 61/3.66.  Urology was consulted.  Hemoglobin increased to 8.2 after getting 2 unit of PRBC.  Patient clinically appears dry to euvolemic but BNP at 1834 , markedly elevated from prior check of 395 64-monthago.  No history of heart failure or prior echocardiogram.  Echocardiogram showed normal EF. 1/3.  IV iron given.  Hypertonic saline started for hyponatremia.  Sodium up to 121 1/4.  Seen by palliative care 1/5.  Hemoglobin 7.0.  Transfusion of 1 unit of packed red blood cells and IV iron today.  Urology ordered an MRI of the abdomen for further evaluation   Assessment and Plan: * Acute on chronic blood loss anemia Patient was transfused 2 units of packed red blood cells and hemoglobin came up to 8.1.  Most recent hemoglobin 8.2.  Likely from hemorrhagic renal cyst versus mass.  IV iron given on 04/09/2022.  IV iron on 04/11/2022 and also a unit of blood for hemoglobin of 7.0.  Left kidney mass Kidney mass versus hemorrhagic cyst urology ordered an MRI of the abdomen for further evaluation.  Hyponatremia Sodium 131.  Off hypertonic saline and on sodium chloride tablets.  CAP (community acquired pneumonia) On Rocephin and Zithromax  Essential hypertension Hold Coreg with bradycardia.  Memory loss Suspect underlying dementia  Acute cystitis with hematuria Catheter related.  Klebsiella growing out of urine culture.  Sensitive  to Rocephin.  Secondary to patient having a catheter, will have to give a longer course of antibiotics.  Gout Continue allopurinol.  Dyslipidemia Continue Statin therapy.  Protein-calorie malnutrition, severe Continue  supplements  CKD (chronic kidney disease), stage IV ([facility]) Last creatinine 3.32 with a GFR of 18        Subjective: Patient seen this morning eating breakfast.  Slow with his eating.  Patient offers no complaints.  Hemoglobin dropped down to 7 and ordered a unit of blood.  Came in with hematuria.  Physical Exam: Vitals:   04/11/22 0830 04/11/22 1146 04/11/22 1211 04/11/22 1546  BP: 131/62 130/62 (!) 126/55 139/60  Pulse: 69 68 72 66  Resp: '16 15 16 14  '$ Temp: 98 F (36.7 C) 97.7 F (36.5 C) 97.7 F (36.5 C) 97.7 F (36.5 C)  TempSrc: Axillary Axillary Axillary Oral  SpO2: 100% 98% 99% 100%  Weight:      Height:       Physical Exam HENT:     Head: Normocephalic.     Mouth/Throat:     Pharynx: No oropharyngeal exudate.  Eyes:     General: Lids are normal.     Comments: Pale conjunctiva  Cardiovascular:     Rate and Rhythm: Normal rate and regular rhythm.     Heart sounds: Normal heart sounds, S1 normal and S2 normal.  Pulmonary:     Breath sounds: No decreased breath sounds, wheezing, rhonchi or rales.  Abdominal:     Palpations: Abdomen is soft.     Tenderness: There is no abdominal tenderness.  Musculoskeletal:     Right lower leg: No swelling.     Left lower leg: No swelling.  Skin:    General: Skin is warm.     Findings: No rash.  Neurological:     Mental Status: He is alert.     Data Reviewed: Sodium 131, hemoglobin 7  Family Communication: Left message for patient's daughter  Disposition: Status is: Inpatient Remains inpatient appropriate because: IV iron and transfusion today  Planned Discharge Destination: Home with Home Health    Time spent: 27 minutes  Author: Loletha Grayer, MD 04/11/2022 3:57 PM  For on call review [email or web address].

## 2022-04-11 NOTE — Progress Notes (Signed)
[facility]  ROUNDING NOTE   Subjective:   Troy Cahall. is a 77 y.o. male with multiple medical problems including atonic bladder status post suprapubic catheter, chronic kidney disease, anemia, hypertension, gout presented to the emergency room with coffee ground emesis and hematuria.  Patient has been admitted for CAP (community acquired pneumonia) [J18.9] Symptomatic anemia [D64.9] Hypotension, unspecified hypotension type [I95.9] Anemia, unspecified type [D64.9] Dyspnea, unspecified type [R06.00] Pneumonia due to infectious organism, unspecified laterality, unspecified part of lung [J18.9] Hematuria, unspecified type [R31.9] Cough, unspecified type [R05.9]  Patient is known to our practice from previous admissions.  Patient was followed in our office but appears to have been lost to follow-up.    Patient seen sitting up in bed Alert and oriented to self Receiving assistance with breakfast  Sodium 130   Objective:  Vital signs in last 24 hours:  Temp:  [97.5 F (36.4 C)-98 F (36.7 C)] 97.7 F (36.5 C) (01/05 1211) Pulse Rate:  [56-106] 72 (01/05 1211) Resp:  [15-18] 16 (01/05 1211) BP: (123-136)/(55-62) 126/55 (01/05 1211) SpO2:  [98 %-100 %] 99 % (01/05 1211) Weight:  [72.8 kg] 72.8 kg (01/05 0500)  Weight change:  Filed Weights   04/08/22 0228 04/11/22 0500  Weight: 67.1 kg 72.8 kg    Intake/Output: I/O last 3 completed shifts: In: 2511.7 [P.O.:230; I.V.:1056.8; Other:260; IV Piggyback:964.8] Out: 1750 [Urine:1750]   Intake/Output this shift:  Total I/O In: 515.4 [P.O.:120; I.V.:145.4; IV Piggyback:250] Out: -   Physical Exam: General: NAD  Head: Normocephalic, atraumatic. Moist oral mucosal membranes  Eyes: Anicteric  Lungs:  Diminished, normal effort, room air  Heart: Regular rate and rhythm  Abdomen:  Soft, nontender, suprapubic catheter  Extremities: No peripheral edema.  Neurologic: Nonfocal, moving all four extremities   Skin: No lesions  Access: None    Basic Metabolic Panel: Recent Labs  Lab 04/07/22 2258 04/08/22 0512 04/08/22 0933 04/09/22 1006 04/09/22 1816 04/10/22 0720 04/10/22 1107 04/10/22 1937 04/10/22 2253 04/11/22 0235 04/11/22 0709 04/11/22 1109  NA 115* 118*   < > 121*   < > 125*   < > 128* 127* 129* 130* 131*  K 4.7 4.8  --  4.8  --  5.1  --   --   --  4.7  --   --   CL 91* 95*  --  99  --  103  --   --   --  107  --   --   CO2 16* 15*  --  16*  --  13*  --   --   --  16*  --   --   GLUCOSE 133* 118*  --  83  --  79  --   --   --  111*  --   --   BUN 70* 61*  --  66*  --  65*  --   --   --  68*  --   --   CREATININE 4.04* 3.66*  --  3.58*  --  3.44*  --   --   --  3.32*  --   --   CALCIUM 8.9 8.6*  --  8.9  --  9.0  --   --   --  9.1  --   --    < > = values in this interval not displayed.     Liver Function Tests: Recent Labs  Lab 04/07/22 2258  AST 41  ALT 31  ALKPHOS 253*  BILITOT 0.6  PROT 5.2*  ALBUMIN 2.1*    No results for input(s): "LIPASE", "AMYLASE" in the last 168 hours. No results for input(s): "AMMONIA" in the last 168 hours.  CBC: Recent Labs  Lab 04/07/22 2258 04/08/22 0933 04/09/22 1316 04/10/22 0720 04/11/22 0235  WBC 8.7 9.6 8.9 7.6  --   NEUTROABS  --   --  7.1  --   --   HGB 5.0* 8.2* 7.9* 8.2* 7.0*  HCT 15.4* 24.3* 23.5* 24.4*  --   MCV 87.0 86.8 87.0 87.8  --   PLT 118* 125* 134* 147*  --      Cardiac Enzymes: No results for input(s): "CKTOTAL", "CKMB", "CKMBINDEX", "TROPONINI" in the last 168 hours.  BNP: Invalid input(s): "POCBNP"  CBG: No results for input(s): "GLUCAP" in the last 168 hours.  Microbiology: Results for orders placed or performed during the hospital encounter of 04/07/22  Resp panel by RT-PCR (RSV, Flu A&B, Covid) Anterior Nasal Swab     Status: None   Collection Time: 04/07/22 10:58 PM   Specimen: Anterior Nasal Swab  Result Value Ref Range Status   SARS Coronavirus 2 by RT PCR NEGATIVE NEGATIVE  Final    Comment: (NOTE) SARS-CoV-2 target nucleic acids are NOT DETECTED.  The SARS-CoV-2 RNA is generally detectable in upper respiratory specimens during the acute phase of infection. The lowest concentration of SARS-CoV-2 viral copies this assay can detect is 138 copies/mL. A negative result does not preclude SARS-Cov-2 infection and should not be used as the sole basis for treatment or other patient management decisions. A negative result may occur with  improper specimen collection/handling, submission of specimen other than nasopharyngeal swab, presence of viral mutation(s) within the areas targeted by this assay, and inadequate number of viral copies(<138 copies/mL). A negative result must be combined with clinical observations, patient history, and epidemiological information. The expected result is Negative.  Fact Sheet for Patients:  EntrepreneurPulse.com.au  Fact Sheet for Healthcare Providers:  IncredibleEmployment.be  This test is no t yet approved or cleared by the [place] FDA and  has been authorized for detection and/or diagnosis of SARS-CoV-2 by FDA under an Emergency Use Authorization (EUA). This EUA will remain  in effect (meaning this test can be used) for the duration of the COVID-19 declaration under Section 564(b)(1) of the Act, 21 U.S.C.section 360bbb-3(b)(1), unless the authorization is terminated  or revoked sooner.       Influenza A by PCR NEGATIVE NEGATIVE Final   Influenza B by PCR NEGATIVE NEGATIVE Final    Comment: (NOTE) The Xpert Xpress SARS-CoV-2/FLU/RSV plus assay is intended as an aid in the diagnosis of influenza from Nasopharyngeal swab specimens and should not be used as a sole basis for treatment. Nasal washings and aspirates are unacceptable for Xpert Xpress SARS-CoV-2/FLU/RSV testing.  Fact Sheet for Patients: EntrepreneurPulse.com.au  Fact Sheet for Healthcare  Providers: IncredibleEmployment.be  This test is not yet approved or cleared by the [place] FDA and has been authorized for detection and/or diagnosis of SARS-CoV-2 by FDA under an Emergency Use Authorization (EUA). This EUA will remain in effect (meaning this test can be used) for the duration of the COVID-19 declaration under Section 564(b)(1) of the Act, 21 U.S.C. section 360bbb-3(b)(1), unless the authorization is terminated or revoked.     Resp Syncytial Virus by PCR NEGATIVE NEGATIVE Final    Comment: (NOTE) Fact Sheet for Patients: EntrepreneurPulse.com.au  Fact Sheet for Healthcare Providers: IncredibleEmployment.be  This test is not yet approved  or cleared by the [place] and has been authorized for detection and/or diagnosis of SARS-CoV-2 by FDA under an Emergency Use Authorization (EUA). This EUA will remain in effect (meaning this test can be used) for the duration of the COVID-19 declaration under Section 564(b)(1) of the Act, 21 U.S.C. section 360bbb-3(b)(1), unless the authorization is terminated or revoked.  Performed at [facility], [address] [place] [address]   Urine Culture     Status: Abnormal   Collection Time: 04/07/22 11:36 PM   Specimen: Urine, Clean Catch  Result Value Ref Range Status   Specimen Description   Final    URINE, CLEAN CATCH Performed at [facility], [address], [place], [place] [address]    Special Requests   Final    NONE Performed at [facility], [place], [place] [address]    Culture >=100,000 COLONIES/mL KLEBSIELLA PNEUMONIAE (A)  Final   Report Status 04/10/2022 FINAL  Final   Organism ID, Bacteria KLEBSIELLA PNEUMONIAE (A)  Final      Susceptibility   Klebsiella pneumoniae - MIC*    AMPICILLIN >=32 RESISTANT Resistant     CEFAZOLIN <=4 SENSITIVE Sensitive     CEFEPIME <=0.12 SENSITIVE  Sensitive     CEFTRIAXONE <=0.25 SENSITIVE Sensitive     CIPROFLOXACIN <=0.25 SENSITIVE Sensitive     GENTAMICIN <=1 SENSITIVE Sensitive     IMIPENEM <=0.25 SENSITIVE Sensitive     NITROFURANTOIN 64 INTERMEDIATE Intermediate     TRIMETH/SULFA >=320 RESISTANT Resistant     AMPICILLIN/SULBACTAM 4 SENSITIVE Sensitive     PIP/TAZO <=4 SENSITIVE Sensitive     * >=100,000 COLONIES/mL KLEBSIELLA PNEUMONIAE    Coagulation Studies: No results for input(s): "LABPROT", "INR" in the last 72 hours.  Urinalysis: No results for input(s): "COLORURINE", "LABSPEC", "PHURINE", "GLUCOSEU", "HGBUR", "BILIRUBINUR", "KETONESUR", "PROTEINUR", "UROBILINOGEN", "NITRITE", "LEUKOCYTESUR" in the last 72 hours.  Invalid input(s): "APPERANCEUR"     Imaging: No results found.   Medications:    sodium chloride Stopped (04/08/22 0054)   cefTRIAXone (ROCEPHIN)  IV 2 g (04/10/22 2316)    allopurinol  300 mg Oral Daily   amLODipine  10 mg Oral Daily   azithromycin  500 mg Oral QHS   calcium-vitamin D  1 tablet Oral Daily   Chlorhexidine Gluconate Cloth  6 each Topical Daily   cholecalciferol  1,000 Units Oral Daily   feeding supplement  237 mL Oral TID BM   ferrous sulfate  325 mg Oral Daily   finasteride  5 mg Oral Daily   multivitamin with minerals  1 tablet Oral Daily   pravastatin  40 mg Oral q1800   sodium chloride  1 g Oral TID WC   acetaminophen **OR** acetaminophen, magnesium hydroxide, ondansetron **OR** ondansetron (ZOFRAN) IV, polyethylene glycol, traZODone  Assessment/ Plan:  [name] is a 77 y.o.  male with multiple medical problems including atonic bladder status post suprapubic catheter, chronic kidney disease, anemia, hypertension, gout presented to the emergency room with hematuria and abdominal distention.  Severe hyponatremia, sodium 115 on ED arrival.  Sodium corrected to 130, IVF stopped. Will order salt tabs 1g TID.   2. Anemia with acute blood loss:   Lab Results   Component Value Date   HGB 7.0 (L) 04/11/2022    Hgb has improved since admission. Patient has received 2 unit blood transfusion. Urology consulted and hematuria remains in catheter. Believed due to renal mass, not a surgical candidate.  Palliative care following.   3. Chronic kidney disease stage 4. Remains at baseline creatinine. Stable   LOS: [place] 1/5/20242:37 PM

## 2022-04-12 DIAGNOSIS — D62 Acute posthemorrhagic anemia: Secondary | ICD-10-CM | POA: Diagnosis not present

## 2022-04-12 DIAGNOSIS — I10 Essential (primary) hypertension: Secondary | ICD-10-CM | POA: Diagnosis not present

## 2022-04-12 DIAGNOSIS — E871 Hypo-osmolality and hyponatremia: Secondary | ICD-10-CM | POA: Diagnosis not present

## 2022-04-12 DIAGNOSIS — N2889 Other specified disorders of kidney and ureter: Secondary | ICD-10-CM | POA: Diagnosis not present

## 2022-04-12 LAB — CBC
HCT: 23 % — ABNORMAL LOW (ref 39.0–52.0)
Hemoglobin: 7.7 g/dL — ABNORMAL LOW (ref 13.0–17.0)
MCH: 29.2 pg (ref 26.0–34.0)
MCHC: 33.5 g/dL (ref 30.0–36.0)
MCV: 87.1 fL (ref 80.0–100.0)
Platelets: 131 10*3/uL — ABNORMAL LOW (ref 150–400)
RBC: 2.64 MIL/uL — ABNORMAL LOW (ref 4.22–5.81)
RDW: 18.6 % — ABNORMAL HIGH (ref 11.5–15.5)
WBC: 13 10*3/uL — ABNORMAL HIGH (ref 4.0–10.5)
nRBC: 0.2 % (ref 0.0–0.2)

## 2022-04-12 LAB — BASIC METABOLIC PANEL
Anion gap: 7 (ref 5–15)
BUN: 66 mg/dL — ABNORMAL HIGH (ref 8–23)
CO2: 16 mmol/L — ABNORMAL LOW (ref 22–32)
Calcium: 9.7 mg/dL (ref 8.9–10.3)
Chloride: 111 mmol/L (ref 98–111)
Creatinine, Ser: 3.22 mg/dL — ABNORMAL HIGH (ref 0.61–1.24)
GFR, Estimated: 19 mL/min — ABNORMAL LOW (ref >60)
Glucose, Bld: 69 mg/dL — ABNORMAL LOW (ref 70–99)
Potassium: 5 mmol/L (ref 3.5–5.1)
Sodium: 134 mmol/L — ABNORMAL LOW (ref 135–145)

## 2022-04-12 MED ORDER — CEPHALEXIN 250 MG PO CAPS
250.0000 mg | ORAL_CAPSULE | Freq: Three times a day (TID) | ORAL | Status: DC
  Administered 2022-04-12 – 2022-04-14 (×6): 250 mg via ORAL
  Filled 2022-04-12 (×7): qty 1

## 2022-04-12 MED ORDER — CEPHALEXIN 500 MG PO CAPS: 500.0000 mg | ORAL_CAPSULE | Freq: Three times a day (TID) | ORAL | Status: DC

## 2022-04-12 NOTE — Plan of Care (Signed)
  Problem: Clinical Measurements: Goal: Will remain free from infection Outcome: Progressing   Problem: Clinical Measurements: Goal: Respiratory complications will improve Outcome: Progressing   Problem: Elimination: Goal: Will not experience complications related to urinary retention Outcome: Progressing   Problem: Pain Managment: Goal: General experience of comfort will improve Outcome: Progressing   Problem: Safety: Goal: Ability to remain free from injury will improve Outcome: Progressing

## 2022-04-12 NOTE — Progress Notes (Signed)
Progress Note   Patient: Troy Berry. HLK:[id number] DOB: [birth year] DOA: 04/07/2022     4 DOS: the patient was seen and examined on 04/12/2022   Brief hospital course: Taken from H&P.  Troy Berry. is a 77 y.o. African-American male with medical history significant for stage III CKD, GERD, hypertension anemia, and dementia who presented to the emergency room with acute onset of hematuria in the setting of a suprapubic catheter along with blood around the stools per his wife discovered in the diaper.  He has a history of ongoing gross hematuria.  No melena was reported.  No nausea or vomiting.  ED course.  On arrival stable vitals, respiratory panel negative.  Pertinent labs with hyponatremia with sodium of 115, do not from 132 on 73/42/8768, metabolic acidosis with bicarb of 16 and chloride of 91, worsening renal function with BUN 70 and creatinine of 4.04 as compared to 49/3.97 on 12/25.  Lactic acid at 2>> 1.5.  CBC with hemoglobin 5, it was 8 on 12/25. EKG is sinus rhythm, prolonged PR interval and chronic Q waves inferiorly. CT chest, abdomen and pelvis with 1. Cardiomegaly with small bilateral pleural effusions. Small heterogeneous foci of ground-glass density in the left upper lobe and left lower lobe suspicious for pneumonia. 2. Increased density in the region of left renal collecting system and within the left ureter which appears dilated. Findings could be secondary to hemorrhage or infection. There are small left kidney stones. 3. Suspicion of hyperdense renal lesions superior pole left kidney, further assessment difficult without contrast, correlation with renal ultrasound could be attempted. 4. Small volume perihepatic ascites. Generalized mesenteric and subcutaneous edema suggesting anasarca. 5. Enlarged prostate with mass effect on the bladder. 6. Redemonstrated enlarged appearing left retroperitoneal lymph nodes which are either reactive or neoplastic in  etiology. 7. Large fluid collection within right groin hernia. 8. Gallstones. 9. Aortic atherosclerosis.  Patient received IV fluid with normal saline, Rocephin and Zithromax for concern of And blood transfusions were ordered.  1/2: Vitals stable, sodium improved to 118.  TSH within normal limit.  Renal function with some improvement to 61/3.66.  Urology was consulted.  Hemoglobin increased to 8.2 after getting 2 unit of PRBC.  Patient clinically appears dry to euvolemic but BNP at 1834 , markedly elevated from prior check of 395 37-monthago.  No history of heart failure or prior echocardiogram.  Echocardiogram showed normal EF. 1/3.  IV iron given.  Hypertonic saline started for hyponatremia.  Sodium up to 121 1/4.  Seen by palliative care 1/5.  Hemoglobin 7.0.  Transfusion of 1 unit of packed red blood cells and IV iron today.  Urology ordered an MRI of the abdomen for further evaluation 1/6.  MRI of the abdomen showing left kidney mass going down into the ureter, possible liver metastases, retroperitoneal adenopathy and possible bone metastases.  Hemoglobin up to 7.7.  Assessment and Plan: * Acute on chronic blood loss anemia Patient was transfused 3 units of packed red blood cells during the hospital course.  Last hemoglobin 7.7.  2 doses of IV Venofer on 1/3 and 1/5.  Patient will likely continue to bleed from his left kidney.  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  I updated patient's wife and daughter on the phone separately.  They will speak together and I will get back with them tomorrow about setting up for a plan.  The patient would be a candidate for hospice if they are agreeable.  Will also consult oncology since the patient's daughter is interested in diagnosis and potential treatment.  CAP (community acquired pneumonia) Completed Rocephin and  Zithromax.  Hyponatremia Sodium 134.  Off hypertonic saline and on sodium chloride tablets.  Essential hypertension Hold Coreg with bradycardia.  Memory loss Suspect underlying dementia  Acute cystitis with hematuria Catheter related.  Klebsiella growing out of urine culture.  Sensitive to Rocephin.  Secondary to patient having a catheter, will have to give a longer course of antibiotics.  Switch Rocephin over to Keflex.  Gout Continue allopurinol.  Dyslipidemia Continue Statin therapy.  Protein-calorie malnutrition, severe Continue supplements  CKD (chronic kidney disease), stage IV (HCC) Last creatinine 3.22 with a GFR of 19        Subjective: Patient answers some yes/no questions.  Offers no complaints.  No pain in his abdomen.  Still having hematuria in the Foley.  Physical Exam: Vitals:   04/11/22 1948 04/12/22 0402 04/12/22 0415 04/12/22 0831  BP: (!) 143/76 129/62  (!) 113/59  Pulse: 73 70  70  Resp: '18 20  20  '$ Temp: 98.1 F (36.7 C) 99.4 F (37.4 C)  97.6 F (36.4 C)  TempSrc:      SpO2: 100% 98%  100%  Weight:   70.3 kg   Height:       Physical Exam HENT:     Head: Normocephalic.     Mouth/Throat:     Pharynx: No oropharyngeal exudate.  Eyes:     General: Lids are normal.  Cardiovascular:     Rate and Rhythm: Normal rate and regular rhythm.     Heart sounds: Normal heart sounds, S1 normal and S2 normal.  Pulmonary:     Breath sounds: No decreased breath sounds, wheezing, rhonchi or rales.  Abdominal:     Palpations: Abdomen is soft.     Tenderness: There is no abdominal tenderness.  Musculoskeletal:     Right lower leg: No swelling.     Left lower leg: No swelling.  Skin:    General: Skin is warm.     Findings: No rash.  Neurological:     Mental Status: He is alert.     Data Reviewed: Sodium 134, creatinine 3.22 with a GFR of 19, hemoglobin 7.7, white blood cell count 13.0, platelet count 131  Family Communication: Spoke with  patient's daughter and patient's wife on the phone  Disposition: Status is: Inpatient Remains inpatient appropriate because: Need to set up an appropriate plan for the patient prior to any disposition. Planned Discharge Destination: Likely home with home health but still considering other options.    Time spent: 34 minutes  Author: Loletha Grayer, MD 04/12/2022 1:32 PM  For on call review [email or web address].

## 2022-04-12 NOTE — Consult Note (Signed)
[place] NOTE  Patient Care Team: [facility], [facility] as PCP - General (General Practice)  CHIEF COMPLAINTS/PURPOSE OF CONSULTATION: Suspected kidney cancer   HISTORY OF PRESENTING ILLNESS: Patient a poor historian given his advanced dementia.  No family by the bedside.  Unable to reach the family.  Chart reviewed in detail.  Troy Berry. 77 y.o.  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    Of note patient also treated for community-acquired pneumonia.  Patient had multiple imaging including CT chest non-contrast; and also CT noncontrast abdomen pelvis noncontrast.  However MRI noncontrast kidney concerning for left renal mass; approximate 2 cm retroperitoneal adenopathy; possible multiple liver lesions, possible skeletal lesions.  Given ongoing anemia patient s/p PRBC transfusion also status post IV iron infusion.  Oncology has been consulted for further discussion regarding concerns for malignancy as noted on imaging.  Review of Systems  Unable to perform ROS: Dementia    MEDICAL HISTORY:  Past Medical History:  Diagnosis Date   Anemia    CKD (chronic kidney disease), stage III ([place])    Gout    Hypertension    Urinary retention    in-dwelling [place] since Nov 2019, sees Dr. Hollice Espy    SURGICAL HISTORY: Past Surgical History:  Procedure Laterality Date   IR CATHETER TUBE CHANGE  12/30/2021   IR RADIOLOGIST EVAL & MGMT  01/01/2022   LAPAROTOMY N/A 01/07/2022   Procedure: EXPLORATORY LAPAROTOMY;  Surgeon: Herbert Pun, MD;  Location: [facility] ORS;  Service: General;  Laterality: N/A;   LAPAROTOMY N/A 01/10/2022   Procedure: EXPLORATORY LAPAROTOMY, Closure of Opened Abdominal Incision Wound;  Surgeon: Herbert Pun, MD;  Location: [facility] ORS;  Service:  General;  Laterality: N/A;    SOCIAL HISTORY: Social History   Socioeconomic History   Marital status: Married    Spouse name: Not on file   Number of children: Not on file   Years of education: Not on file   Highest education level: Not on file  Occupational History   Not on file  Tobacco Use   Smoking status: Former    Types: Cigarettes    Quit date: 04/07/1998    Years since quitting: 24.0    Passive exposure: Past   Smokeless tobacco: Never  Vaping Use   Vaping Use: Never used  Substance and Sexual Activity   Alcohol use: Yes   Drug use: Never   Sexual activity: Not Currently    Birth control/protection: None  Other Topics Concern   Not on file  Social History Narrative   Lives at home with family   Social Determinants of Health   Financial Resource Strain: Low Risk  (04/28/2018)   Overall Financial Resource Strain (CARDIA)    Difficulty of Paying Living Expenses: Not hard at all  Food Insecurity: No Food Insecurity (04/09/2022)   Hunger Vital Sign    Worried About Running Out of Food in the Last Year: Never true    Ran Out of Food in the Last Year: Never true  Transportation Needs: Unmet Transportation Needs (04/09/2022)   PRAPARE - Hydrologist (Medical): Yes    Lack of Transportation (Non-Medical): No  Physical Activity: Inactive (04/28/2018)   Exercise Vital Sign    Days of Exercise per Week: 0 days    Minutes of Exercise per Session: 0 min  Stress: No Stress Concern Present (04/28/2018)   [place]    Feeling of Stress : Not at all  Social Connections: Moderately Isolated (04/28/2018)   Social Connection and Isolation Panel [NHANES]    Frequency of Communication with Friends and Family: Once a week    Frequency of Social Gatherings with Friends and Family: Once a week    Attends Religious Services: Never    Marine scientist or Organizations: No    Attends English as a second language teacher Meetings: Never    Marital Status: Married  Human resources officer Violence: Not At Risk (04/09/2022)   Humiliation, Afraid, Rape, and Kick questionnaire    Fear of Current or Ex-Partner: No    Emotionally Abused: No    Physically Abused: No    Sexually Abused: No    FAMILY HISTORY: Family History  Problem Relation Age of Onset   Cancer Father     ALLERGIES:  is allergic to ace inhibitors.  MEDICATIONS:  Current Facility-Administered Medications  Medication Dose Route Frequency Provider Last Rate Last Admin   0.9 %  sodium chloride infusion  10 mL/hr Intravenous Once Harvest Dark, MD   Held at 04/08/22 0054   acetaminophen (TYLENOL) tablet 650 mg  650 mg Oral Q6H PRN Mansy, Jan A, MD   650 mg at 04/10/22 2310   Or   acetaminophen (TYLENOL) suppository 650 mg  650 mg Rectal Q6H PRN Mansy, Jan A, MD       allopurinol (ZYLOPRIM) tablet 300 mg  300 mg Oral Daily Mansy, Jan A, MD   300 mg at 04/12/22 0959   amLODipine (NORVASC) tablet 10 mg  10 mg Oral Daily Mansy, Jan A, MD   10 mg at 04/12/22 1000   azithromycin (ZITHROMAX) tablet 500 mg  500 mg Oral QHS Lorin Picket, RPH   500 mg at 04/11/22 2216   calcium-vitamin D (OSCAL WITH D) 500-5 MG-MCG per tablet 1 tablet  1 tablet Oral Daily Mansy, Jan A, MD   1 tablet at 04/12/22 1000   cephALEXin (KEFLEX) capsule 250 mg  250 mg Oral Q8H Wieting, Richard, MD   250 mg at 04/12/22 1527   Chlorhexidine Gluconate Cloth 2 % PADS 6 each  6 each Topical Daily Loletha Grayer, MD   6 each at 04/12/22 1000   cholecalciferol (VITAMIN D3) 25 MCG (1000 UNIT) tablet 1,000 Units  1,000 Units Oral Daily Mansy, Jan A, MD   1,000 Units at 04/12/22 1000   feeding supplement (ENSURE ENLIVE / ENSURE PLUS) liquid 237 mL  237 mL Oral TID BM Loletha Grayer, MD   237 mL at 04/12/22 1351   ferrous sulfate tablet 325 mg  325 mg Oral Daily Mansy, Jan A, MD   325 mg at 04/12/22 0959   finasteride (PROSCAR) tablet 5 mg  5 mg Oral Daily Mansy, Jan  A, MD   5 mg at 04/12/22 1000   magnesium hydroxide (MILK OF MAGNESIA) suspension 30 mL  30 mL Oral Daily PRN Mansy, Jan A, MD       multivitamin with minerals tablet 1 tablet  1 tablet Oral Daily Loletha Grayer, MD   1 tablet at 04/12/22 0959   ondansetron (ZOFRAN) tablet 4 mg  4 mg Oral Q6H PRN Mansy, Jan A, MD       Or   ondansetron [facility]) injection 4 mg  4 mg Intravenous Q6H PRN Mansy, Arvella Merles, MD  polyethylene glycol (MIRALAX / GLYCOLAX) packet 17 g  17 g Oral Daily PRN Mansy, Jan A, MD       pravastatin (PRAVACHOL) tablet 40 mg  40 mg Oral q1800 Mansy, Jan A, MD   40 mg at 04/11/22 1842   sodium chloride tablet 1 g  1 g Oral TID WC Breeze, Shantelle, NP   1 g at 04/12/22 1351   traZODone (DESYREL) tablet 25 mg  25 mg Oral QHS PRN Mansy, Jan A, MD        PHYSICAL EXAMINATION:  Vitals:   04/12/22 0831 04/12/22 1540  BP: (!) 113/59 (!) 112/54  Pulse: 70 70  Resp: 20 18  Temp: 97.6 F (36.4 C) 98.6 F (37 C)  SpO2: 100% 100%   Filed Weights   04/08/22 0228 04/11/22 0500 04/12/22 0415  Weight: 148 lb (67.1 kg) 160 lb 7.9 oz (72.8 kg) 154 lb 15.7 oz (70.3 kg)    Physical Exam Vitals and nursing note reviewed.  HENT:     Head: Normocephalic and atraumatic.     Mouth/Throat:     Pharynx: Oropharynx is clear.  Eyes:     Extraocular Movements: Extraocular movements intact.     Pupils: Pupils are equal, round, and reactive to light.  Cardiovascular:     Rate and Rhythm: Normal rate and regular rhythm.  Pulmonary:     Comments: Decreased breath sounds bilaterally.  Abdominal:     Palpations: Abdomen is soft.  Musculoskeletal:        General: Normal range of motion.     Cervical back: Normal range of motion.  Skin:    General: Skin is warm.  Neurological:     General: No focal deficit present.     Mental Status: He is alert.     LABORATORY DATA:  I have reviewed the data as listed Lab Results  Component Value Date   WBC 13.0 (H) 04/12/2022   HGB 7.7 (L)  04/12/2022   HCT 23.0 (L) 04/12/2022   MCV 87.1 04/12/2022   PLT 131 (L) 04/12/2022   Recent Labs    03/05/22 1551 03/17/22 1245 03/31/22 1240 04/07/22 2258 04/08/22 0512 04/10/22 0720 04/10/22 1107 04/11/22 0235 04/11/22 0709 04/11/22 1109 04/11/22 1620 04/12/22 0446  NA 135 133*   < > 115*   < > 125*   < > 129*   < > 131* 132* 134*  K 4.8 5.4*   < > 4.7   < > 5.1  --  4.7  --   --   --  5.0  CL 108 103   < > 91*   < > 103  --  107  --   --   --  111  CO2 20* 23   < > 16*   < > 13*  --  16*  --   --   --  16*  GLUCOSE 91 101*   < > 133*   < > 79  --  111*  --   --   --  69*  BUN 55* 40*   < > 70*   < > 65*  --  68*  --   --   --  66*  CREATININE 3.71* 3.66*   < > 4.04*   < > 3.44*  --  3.32*  --   --   --  3.22*  CALCIUM 10.0 10.0   < > 8.9   < > 9.0  --  9.1  --   --   --  9.7  GFRNONAA 16* 16*   < > 15*   < > 18*  --  18*  --   --   --  19*  PROT 6.4* 6.2*  --  5.2*  --   --   --   --   --   --   --   --   ALBUMIN 2.7* 2.7*  --  2.1*  --   --   --   --   --   --   --   --   AST 14* 27  --  41  --   --   --   --   --   --   --   --   ALT 9 19  --  31  --   --   --   --   --   --   --   --   ALKPHOS 64 84  --  253*  --   --   --   --   --   --   --   --   BILITOT 0.5 0.6  --  0.6  --   --   --   --   --   --   --   --    < > = values in this interval not displayed.    RADIOGRAPHIC STUDIES: I have personally reviewed the radiological images as listed and agreed with the findings in the report. MR ABDOMEN WO CONTRAST  Result Date: 04/12/2022 CLINICAL DATA:  Indeterminate renal mass/cyst. EXAM: MRI ABDOMEN WITHOUT CONTRAST TECHNIQUE: Multiplanar multisequence MR imaging was performed without the administration of intravenous contrast. COMPARISON:  CT AP 04/07/2022 FINDINGS: Exam detail is markedly diminished due to excessive motion artifact. Further, evaluation for solid organ pathology is limited reflecting lack of IV contrast material. Lower chest: Cardiac enlargement.   Bilateral pleural effusions. Hepatobiliary: Limited assessment due to excessive motion artifact. Diffusely heterogeneous appearance of the liver with multiple scattered areas of increased T2 signal. Reference lesion in segment 7 measures 1.7 x 1.5 cm, image 13/8. Reference lesion in segment 6 measures 1.5 x 1.2 cm, image 24/8. Multiple small stones are seen layering within the dependent portion of the gallbladder. Assessment of gallbladder wall thickness is limited due to motion artifact. No intrahepatic bile duct dilatation. Diffuse periportal edema noted. No signs of common bile duct dilatation. Pancreas: No mass, inflammatory changes, or other parenchymal abnormality identified. Spleen: There is an indeterminate T2 hyperintense lesion within the spleen measures 1.7 cm. This contains a central area of low T2 signal. This is incompletely characterized without IV contrast. Adrenals/Urinary Tract: The adrenal glands are grossly unremarkable. Evaluation of the kidneys is markedly limited due to motion artifact. Bilateral kidney cysts identified. Asymmetric left-sided perinephric fat stranding noted. Within these limitations, there appears to be a mass occupying lesion within the upper pole and interpolar left kidney with extension into the collecting system and dilated left ureter, image 12/4 and image 27/8. Cannot excess patency of the left renal vein reflecting lack of IV contrast material and motion artifact. Urinary bladder is identified which appears partially decompressed around a suprapubic catheter and Foley catheter. Stomach/Bowel: No pathologic dilatation of the large or small bowel loops. Vascular/Lymphatic: No signs of infrarenal abdominal aortic aneurysm. Aortic atherosclerosis. Retroperitoneal adenopathy is identified: Reference left periaortic node measures 1.7 cm, image 17/8. Unchanged. Reference left periaortic node measures 2 cm, image 21/8. Unchanged. Other:  Small volume of abdominal ascites.  Musculoskeletal:  The bone marrow appears diffusely abnormal with multiple lesions throughout the lumbar spine and visualized bony pelvis worrisome for bone metastases. Reference lesion within the L4 vertebral body measures 1.8 cm, image 30/8. IMPRESSION: 1. Exam detail is markedly diminished due to excessive motion artifact. Further, evaluation for solid organ pathology is limited reflecting lack of IV contrast material. 2. There appears to be a mass occupying lesion within the upper pole and interpolar left kidney with extension into the collecting system and dilated left ureter. Cannot excess patency of the left renal vein reflecting lack of IV contrast material and motion artifact. Imaging findings are suspicious for underlying malignancy. 3. Diffusely heterogeneous appearance of the liver with multiple scattered areas of increased T2 signal. Although incompletely characterized without IV contrast, findings are suspicious for diffuse liver metastases. 4. Retroperitoneal adenopathy. 5. The bone marrow appears diffusely abnormal with multiple lesions throughout the lumbar spine and visualized bony pelvis worrisome for bone metastases. 6. Small volume of abdominal ascites. This may be secondary to fluid overload. 7. Cardiac enlargement and bilateral pleural effusions concerning for CHF. Bilateral pleural effusions. 8. Cholelithiasis. Electronically Signed   By: Kerby Moors M.D.   On: 04/12/2022 08:20   ECHOCARDIOGRAM COMPLETE  Result Date: 04/09/2022    ECHOCARDIOGRAM REPORT   Patient Name:   Troy Berry. Date of Exam: 04/09/2022 Medical Rec #:  [id number]            Height:       68.0 in Accession #:    [id number]           Weight:       148.0 lb Date of Birth:  January 20, 1946            BSA:          1.798 m Patient Age:    79 years             BP:           126/52 mmHg Patient Gender: M                    HR:           52 bpm. Exam Location:  [facility] Procedure: 2D Echo, Cardiac Doppler and Color Doppler  Indications:     Abnormal ECG R94.31  History:         Patient has no prior history of Echocardiogram examinations.                  Risk Factors:Hypertension. CKD.  Sonographer:     Sherrie Sport Referring Phys:  [id number] [facility] AMIN Diagnosing Phys: Ida Rogue MD  Sonographer Comments: Suboptimal parasternal window. IMPRESSIONS  1. Challenging images  2. Left ventricular ejection fraction, by estimation, is 55 to 60%. The left ventricle has normal function. The left ventricle has no regional wall motion abnormalities. There is mild left ventricular hypertrophy, unable to exclude moderate hypertrophy of the inferior and lateral wall. Left ventricular diastolic parameters are consistent with Grade I diastolic dysfunction (impaired relaxation).  3. Right ventricular systolic function is mildly reduced, though not well visualized. The right ventricular size is normal. There is normal pulmonary artery systolic pressure. The estimated right ventricular systolic pressure is 85.8 mmHg.  4. Left atrial size was mild to moderately dilated.  5. The mitral valve is normal in structure. Moderate mitral valve regurgitation. No evidence of mitral stenosis.  6. The aortic valve is normal in structure. Aortic valve regurgitation is moderate  to severe. No aortic stenosis is present.  7. There is severe dilatation of the aortic root, measuring 49 mm.Ascending aorta not well visulized.  8. The inferior vena cava is normal in size with greater than 50% respiratory variability, suggesting right atrial pressure of 3 mmHg. FINDINGS  Left Ventricle: Left ventricular ejection fraction, by estimation, is 55 to 60%. The left ventricle has normal function. The left ventricle has no regional wall motion abnormalities. The left ventricular internal cavity size was normal in size. There is  mild left ventricular hypertrophy. Left ventricular diastolic parameters are consistent with Grade I diastolic dysfunction (impaired relaxation). Right  Ventricle: The right ventricular size is normal. No increase in right ventricular wall thickness. Right ventricular systolic function is mildly reduced. There is normal pulmonary artery systolic pressure. The tricuspid regurgitant velocity is 2.78 m/s, and with an assumed right atrial pressure of 5 mmHg, the estimated right ventricular systolic pressure is 49.7 mmHg. Left Atrium: Left atrial size was mild to moderately dilated. Right Atrium: Right atrial size was normal in size. Pericardium: There is no evidence of pericardial effusion. Mitral Valve: The mitral valve is normal in structure. There is mild calcification of the mitral valve leaflet(s). Mild mitral annular calcification. Moderate mitral valve regurgitation. No evidence of mitral valve stenosis. Tricuspid Valve: The tricuspid valve is normal in structure. Tricuspid valve regurgitation is not demonstrated. No evidence of tricuspid stenosis. Aortic Valve: The aortic valve is normal in structure. Aortic valve regurgitation is moderate to severe. Aortic regurgitation PHT measures 433 msec. No aortic stenosis is present. Aortic valve mean gradient measures 4.0 mmHg. Aortic valve peak gradient measures 6.7 mmHg. Aortic valve area, by VTI measures 2.68 cm. Pulmonic Valve: The pulmonic valve was normal in structure. Pulmonic valve regurgitation is not visualized. No evidence of pulmonic stenosis. Aorta: The aortic root is normal in size and structure. There is severe dilatation of the aortic root, measuring 49 mm. Venous: The inferior vena cava is normal in size with greater than 50% respiratory variability, suggesting right atrial pressure of 3 mmHg. IAS/Shunts: No atrial level shunt detected by color flow Doppler.  LEFT VENTRICLE PLAX 2D LVIDd:         4.90 cm   Diastology LVIDs:         3.50 cm   LV e' medial:    5.22 cm/s LV PW:         1.10 cm   LV E/e' medial:  14.9 LV IVS:        1.20 cm   LV e' lateral:   10.80 cm/s LVOT diam:     2.10 cm   LV E/e'  lateral: 7.2 LV SV:         81 LV SV Index:   45 LVOT Area:     3.46 cm  RIGHT VENTRICLE RV Basal diam:  2.50 cm RV Mid diam:    3.10 cm RV S prime:     15.90 cm/s TAPSE (M-mode): 1.9 cm LEFT ATRIUM           Index        RIGHT ATRIUM           Index LA diam:      4.10 cm 2.28 cm/m   RA Area:     10.80 cm LA Vol (A4C): 76.2 ml 42.37 ml/m  RA Volume:   15.90 ml  8.84 ml/m  AORTIC VALVE AV Area (Vmax):    3.52 cm AV Area (Vmean):   2.98  cm AV Area (VTI):     2.68 cm AV Vmax:           129.00 cm/s AV Vmean:          95.200 cm/s AV VTI:            0.301 m AV Peak Grad:      6.7 mmHg AV Mean Grad:      4.0 mmHg LVOT Vmax:         131.00 cm/s LVOT Vmean:        81.800 cm/s LVOT VTI:          0.233 m LVOT/AV VTI ratio: 0.77 AI PHT:            433 msec  AORTA Ao Root diam: 5.07 cm MITRAL VALVE                TRICUSPID VALVE MV Area (PHT): 2.70 cm     TR Peak grad:   30.9 mmHg MV Decel Time: 281 msec     TR Vmax:        278.00 cm/s MV E velocity: 77.60 cm/s MV A velocity: 123.00 cm/s  SHUNTS MV E/A ratio:  0.63         Systemic VTI:  0.23 m                             Systemic Diam: 2.10 cm Ida Rogue MD Electronically signed by Ida Rogue MD Signature Date/Time: 04/09/2022/12:44:09 PM    Final    US RENAL  Result Date: 04/08/2022 CLINICAL DATA:  Hydronephrosis, left.  Renal mass on CT. EXAM: RENAL / URINARY TRACT ULTRASOUND COMPLETE COMPARISON:  Noncontrast abdominal CT 04/07/2022 and 03/05/2022. Renal ultrasound 12/21/2017. FINDINGS: Right Kidney: Renal measurements: 10.1 x 4.0 x 4.7 cm = volume: 99.1 mL. Cortical thinning with increased cortical echogenicity. Small renal cysts measuring up to 1.8 cm in diameter. No hydronephrosis. Left Kidney: Renal measurements: 12.3 x 6.1 x 5.7 cm = volume: 221.4 mL. Multiple cysts are noted, measuring up to 4.2 cm in the upper pole. No discrete mass is seen to correspond with the abnormality on recent CT. Bladder: Suprapubic catheter in place. The urinary bladder is  mildly distended. Other: Ascites and a small left pleural effusion are noted. The prostate gland is moderately enlarged. Numerous hypoechoic liver lesions are noted, incompletely characterized. Examination limited by patient condition. IMPRESSION: 1. No discrete left renal mass identified by ultrasound to correspond with the abnormality on recent CT's. CT finding remains indeterminate and could reflect parenchymal hemorrhage or a hemorrhagic mass. Recommend further evaluation with dedicated renal CT or MRI (without and with contrast). 2. Bilateral renal cysts. 3. Right renal cortical thinning and increased cortical echogenicity, nonspecific but can be seen with chronic medical renal disease. 4. Ascites and small left pleural effusion. 5. Numerous hypoechoic liver lesions, incompletely characterized. Electronically Signed   By: Richardean Sale M.D.   On: 04/08/2022 10:33   CT CHEST ABDOMEN PELVIS WO CONTRAST  Result Date: 04/07/2022 CLINICAL DATA:  Cough EXAM: CT CHEST, ABDOMEN AND PELVIS WITHOUT CONTRAST TECHNIQUE: Multidetector CT imaging of the chest, abdomen and pelvis was performed following the standard protocol without IV contrast. RADIATION DOSE REDUCTION: This exam was performed according to the departmental dose-optimization program which includes automated exposure control, adjustment of the mA and/or kV according to patient size and/or use of iterative reconstruction technique. COMPARISON:  Chest x-ray 04/07/2022, CT 03/05/2022 FINDINGS: CT  CHEST FINDINGS Cardiovascular: Limited evaluation without intravenous contrast. Advanced aortic atherosclerosis. 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 Extensive coronary vascular calcifications. Cardiomegaly. No pericardial effusion. Mediastinum/Nodes: Midline trachea. No thyroid mass. Numerous calcified mediastinal and hilar nodes as before. Esophagus within normal limits. Lungs/Pleura: Small bilateral pleural effusions are new.  Respiratory motion degradation. Small heterogeneous foci of ground-glass density in the left upper lobe and lower lobe. Musculoskeletal: Sternum is intact. Degenerative changes of the spine. No acute osseous abnormality. CT ABDOMEN PELVIS FINDINGS Hepatobiliary: No focal hepatic abnormality. Gallstones. No biliary dilatation. Pancreas: Unremarkable. No pancreatic ductal dilatation or surrounding inflammatory changes. Spleen: Normal in size without focal abnormality. Adrenals/Urinary Tract: Adrenal glands are within normal limits. Extensive intrarenal vascular calcifications. Mild atrophy of right kidney. Bilateral renal cysts for which no imaging follow-up is recommended. Possible hyperdense lesion at the upper pole of the left kidney, series 2, image 54, limited assessment without contrast and the images are degraded by motion. Increased density in the region of left renal collecting system, series 2, image 64, and within the left ureter which appears dilated. Small left kidney stones. Suprapubic bladder catheter with small amount of air in the bladder. Stomach/Bowel: The stomach is mildly distended. There is no dilated small bowel. No acute bowel wall thickening. Negative appendix. No acute bowel wall thickening. Vascular/Lymphatic: Advanced aortic atherosclerosis. No aneurysm. Enlarged appearing left retroperitoneal lymph nodes measuring up to 2.4 cm. Reproductive: Prostate is enlarged. Mass effect on the bladder. Large fluid collection in the right groin measuring 5.6 cm. Other: No free air. Small volume perihepatic ascites. Generalized mesenteric and subcutaneous edema. Musculoskeletal: No acute osseous abnormality. IMPRESSION: 1. Cardiomegaly with small bilateral pleural effusions. Small heterogeneous foci of ground-glass density in the left upper lobe and left lower lobe suspicious for pneumonia. 2. Increased density in the region of left renal collecting system and within the left ureter which appears  dilated. Findings could be secondary to hemorrhage or infection. There are small left kidney stones. 3. Suspicion of hyperdense renal lesions superior pole left kidney, further assessment difficult without contrast, correlation with renal ultrasound could be attempted. 4. Small volume perihepatic ascites. Generalized mesenteric and subcutaneous edema suggesting anasarca. 5. Enlarged prostate with mass effect on the bladder. 6. Redemonstrated enlarged appearing left retroperitoneal lymph nodes which are either reactive or neoplastic in etiology. 7. Large fluid collection within right groin hernia. 8. Gallstones. 9. Aortic atherosclerosis. Aortic Atherosclerosis (ICD10-I70.0). Electronically Signed   By: Donavan Foil M.D.   On: 04/07/2022 23:51   DG Chest Portable 1 View  Result Date: 04/07/2022 CLINICAL DATA:  Shortness of breath and cough EXAM: PORTABLE CHEST 1 VIEW COMPARISON:  03/17/2022 FINDINGS: Cardiac shadow is enlarged. Multiple calcified hilar lymph nodes are noted. Thoracic aorta is tortuous. Vascular congestion is seen with mild interstitial edema. Small right pleural effusion is noted. No acute bony abnormality is noted. IMPRESSION: Changes consistent with CHF with small left pleural effusion. Electronically Signed   By: Inez Catalina M.D.   On: 04/07/2022 23:15   DG Chest Port 1 View  Result Date: 03/17/2022 CLINICAL DATA:  Recently hospitalized for pneumonia, difficulty ambulating. EXAM: PORTABLE CHEST 1 VIEW COMPARISON:  01/31/2019 and CT chest 03/05/2022. FINDINGS: Patient's head obscures the superior mediastinum. Heart is enlarged. Calcified mediastinal and hilar lymph nodes. Tortuous thoracic aorta. Lungs are fairly low in volume. Probable small right pleural effusion. Old resection of the distal left clavicle. High-riding humeral heads bilaterally. IMPRESSION: Probable small right pleural effusion. Electronically  Signed   By: Lorin Picket M.D.   On: 03/17/2022 3:30    77 year old male  patient with multiple medical problems including atonic bladder status post suprapubic catheter dementia chronic kidney disease hypertension presents to the hospital with coffee-ground emesis/hematuria.  Patient also treated for community-acquired pneumonia.  Noncontrast MRI kidney concerning for left renal mass; approximate 2 cm retroperitoneal adenopathy; possible multiple liver lesions, possible skeletal lesions.  # Hematuria/kidney mass- Noncontrast MRI kidney concerning for left renal mass; approximate 2 cm retroperitoneal adenopathy; possible multiple liver lesions, possible skeletal lesions.  Highly concerning for malignancy causing patient's hematuria/anemia.  # Anemia-hemoglobin 7.7-secondary to hematuria/chronic disease/possible underlying malignancy.  # Multiple comorbidities including chronic urinary retention-s/p suprapubic catheter placement; chronic kidney disease-IV; dementia.  # RECOMMENDATIONS:  # Agree with IV iron infusion.  And also PRBC transfusion to keep the hemoglobin above 7.  # If this is malignancy as indicated by the imaging/symptoms given patient's advanced dementia/multiple other co-morbidities/poor functional status-patient is not candidate for any therapy.  Hospice would be reasonable option.  I tried to reach Ms. Jimmye Norman patient's daughter.  I was unable to reach; I left a voicemail for the patient's daughter to call me back to discuss the above plan of care.  Also discussed with Dr. Earleen Newport.   # I aso reviewed the imaging independently [as summarized above]-     Cammie Sickle, MD 04/12/2022 5:24 PM

## 2022-04-12 NOTE — Progress Notes (Signed)
Noted on chart review this a.m. that urine cytology sent yesterday continued to show "needs collection".  Called lab to verify specimen had been received and was informed specimen received when sent yesterday however they could not find an order to correlate even though this RN had written "Urine Cytology" on specimen label.  Per lab, pathology/cytology requisition printed to place with specimen for processing as of our conversation this a.m.

## 2022-04-12 NOTE — Progress Notes (Signed)
[facility]  PROGRESS NOTE   Subjective:     Objective:  Vital signs: Blood pressure (!) 113/59, pulse 70, temperature 97.6 F (36.4 C), resp. rate 20, height '5\' 8"'$  (1.727 m), weight 70.3 kg, SpO2 100 %.  Intake/Output Summary (Last 24 hours) at 04/12/2022 1511 Last data filed at 04/12/2022 1355 Gross per 24 hour  Intake 655 ml  Output 1750 ml  Net -1095 ml   Filed Weights   04/08/22 0228 04/11/22 0500 04/12/22 0415  Weight: 67.1 kg 72.8 kg 70.3 kg     Physical Exam: General:  No acute distress  Head:  Normocephalic, atraumatic. Moist oral mucosal membranes  Eyes:  Anicteric  Neck:  Supple  Lungs:   Clear to auscultation, normal effort  Heart:  S1S2 no rubs  Abdomen:   Soft, nontender, bowel sounds present  Extremities:  peripheral edema.  Neurologic:  Awake, alert, following commands  Skin:  No lesions  Access:     Basic Metabolic Panel: Recent Labs  Lab 04/08/22 0512 04/08/22 0933 04/09/22 1006 04/09/22 1816 04/10/22 0720 04/10/22 1107 04/11/22 0235 04/11/22 0709 04/11/22 1109 04/11/22 1620 04/12/22 0446  NA 118*   < > 121*   < > 125*   < > 129* 130* 131* 132* 134*  K 4.8  --  4.8  --  5.1  --  4.7  --   --   --  5.0  CL 95*  --  99  --  103  --  107  --   --   --  111  CO2 15*  --  16*  --  13*  --  16*  --   --   --  16*  GLUCOSE 118*  --  83  --  79  --  111*  --   --   --  69*  BUN 61*  --  66*  --  65*  --  68*  --   --   --  66*  CREATININE 3.66*  --  3.58*  --  3.44*  --  3.32*  --   --   --  3.22*  CALCIUM 8.6*  --  8.9  --  9.0  --  9.1  --   --   --  9.7   < > = values in this interval not displayed.   GFR: Estimated Creatinine Clearance: 18.6 mL/min (A) (by C-G formula based on SCr of 3.22 mg/dL (H)).  Liver Function Tests: Recent Labs  Lab 04/07/22 2258  AST 41  ALT 31  ALKPHOS 253*  BILITOT 0.6  PROT 5.2*  ALBUMIN 2.1*   No results for input(s): "LIPASE", "AMYLASE" in the last 168 hours. No results for input(s):  "AMMONIA" in the last 168 hours.  CBC: Recent Labs  Lab 04/07/22 2258 04/08/22 0933 04/09/22 1316 04/10/22 0720 04/11/22 0235 04/11/22 1620 04/12/22 0446  WBC 8.7 9.6 8.9 7.6  --   --  13.0*  NEUTROABS  --   --  7.1  --   --   --   --   HGB 5.0* 8.2* 7.9* 8.2* 7.0* 7.5* 7.7*  HCT 15.4* 24.3* 23.5* 24.4*  --   --  23.0*  MCV 87.0 86.8 87.0 87.8  --   --  87.1  PLT 118* 125* 134* 147*  --   --  131*     HbA1C: Hgb A1c MFr Bld  Date/Time Value Ref Range Status  01/15/2022 05:45 AM 5.2 4.8 -  5.6 % Final    Comment:    (NOTE) Pre diabetes:          5.7%-6.4%  Diabetes:              >6.4%  Glycemic control for   <7.0% adults with diabetes     Urinalysis: No results for input(s): "COLORURINE", "LABSPEC", "PHURINE", "GLUCOSEU", "HGBUR", "BILIRUBINUR", "KETONESUR", "PROTEINUR", "UROBILINOGEN", "NITRITE", "LEUKOCYTESUR" in the last 72 hours.  Invalid input(s): "APPERANCEUR"    Imaging: MR ABDOMEN WO CONTRAST  Result Date: 04/12/2022 CLINICAL DATA:  Indeterminate renal mass/cyst. EXAM: MRI ABDOMEN WITHOUT CONTRAST TECHNIQUE: Multiplanar multisequence MR imaging was performed without the administration of intravenous contrast. COMPARISON:  CT AP 04/07/2022 FINDINGS: Exam detail is markedly diminished due to excessive motion artifact. Further, evaluation for solid organ pathology is limited reflecting lack of IV contrast material. Lower chest: Cardiac enlargement.  Bilateral pleural effusions. Hepatobiliary: Limited assessment due to excessive motion artifact. Diffusely heterogeneous appearance of the liver with multiple scattered areas of increased T2 signal. Reference lesion in segment 7 measures 1.7 x 1.5 cm, image 13/8. Reference lesion in segment 6 measures 1.5 x 1.2 cm, image 24/8. Multiple small stones are seen layering within the dependent portion of the gallbladder. Assessment of gallbladder wall thickness is limited due to motion artifact. No intrahepatic bile duct  dilatation. Diffuse periportal edema noted. No signs of common bile duct dilatation. Pancreas: No mass, inflammatory changes, or other parenchymal abnormality identified. Spleen: There is an indeterminate T2 hyperintense lesion within the spleen measures 1.7 cm. This contains a central area of low T2 signal. This is incompletely characterized without IV contrast. Adrenals/Urinary Tract: The adrenal glands are grossly unremarkable. Evaluation of the kidneys is markedly limited due to motion artifact. Bilateral kidney cysts identified. Asymmetric left-sided perinephric fat stranding noted. Within these limitations, there appears to be a mass occupying lesion within the upper pole and interpolar left kidney with extension into the collecting system and dilated left ureter, image 12/4 and image 27/8. Cannot excess patency of the left renal vein reflecting lack of IV contrast material and motion artifact. Urinary bladder is identified which appears partially decompressed around a suprapubic catheter and Foley catheter. Stomach/Bowel: No pathologic dilatation of the large or small bowel loops. Vascular/Lymphatic: No signs of infrarenal abdominal aortic aneurysm. Aortic atherosclerosis. Retroperitoneal adenopathy is identified: Reference left periaortic node measures 1.7 cm, image 17/8. Unchanged. Reference left periaortic node measures 2 cm, image 21/8. Unchanged. Other:  Small volume of abdominal ascites. Musculoskeletal: The bone marrow appears diffusely abnormal with multiple lesions throughout the lumbar spine and visualized bony pelvis worrisome for bone metastases. Reference lesion within the L4 vertebral body measures 1.8 cm, image 30/8. IMPRESSION: 1. Exam detail is markedly diminished due to excessive motion artifact. Further, evaluation for solid organ pathology is limited reflecting lack of IV contrast material. 2. There appears to be a mass occupying lesion within the upper pole and interpolar left kidney with  extension into the collecting system and dilated left ureter. Cannot excess patency of the left renal vein reflecting lack of IV contrast material and motion artifact. Imaging findings are suspicious for underlying malignancy. 3. Diffusely heterogeneous appearance of the liver with multiple scattered areas of increased T2 signal. Although incompletely characterized without IV contrast, findings are suspicious for diffuse liver metastases. 4. Retroperitoneal adenopathy. 5. The bone marrow appears diffusely abnormal with multiple lesions throughout the lumbar spine and visualized bony pelvis worrisome for bone metastases. 6. Small volume of abdominal ascites. This may be  secondary to fluid overload. 7. Cardiac enlargement and bilateral pleural effusions concerning for CHF. Bilateral pleural effusions. 8. Cholelithiasis. Electronically Signed   By: Kerby Moors M.D.   On: 04/12/2022 08:20     Medications:    sodium chloride Stopped (04/08/22 0054)    allopurinol  300 mg Oral Daily   amLODipine  10 mg Oral Daily   azithromycin  500 mg Oral QHS   calcium-vitamin D  1 tablet Oral Daily   cephALEXin  250 mg Oral Q8H   Chlorhexidine Gluconate Cloth  6 each Topical Daily   cholecalciferol  1,000 Units Oral Daily   feeding supplement  237 mL Oral TID BM   ferrous sulfate  325 mg Oral Daily   finasteride  5 mg Oral Daily   multivitamin with minerals  1 tablet Oral Daily   pravastatin  40 mg Oral q1800   sodium chloride  1 g Oral TID WC    Assessment/ Plan:     77 y.o. male with multiple medical problems including atonic bladder status post suprapubic catheter, chronic kidney disease, anemia, hypertension, gout presented to the emergency room with coffee ground emesis and hematuria.  Patient has been admitted for CAP (community acquired pneumonia) [J18.9] Symptomatic anemia [D64.9] Hypotension, unspecified hypotension type [I95.9] Anemia, unspecified type [D64.9] Dyspnea, unspecified type  [R06.00] Pneumonia due to infectious organism, unspecified laterality, unspecified part of lung [J18.9] Hematuria, unspecified type [R31.9]  #1: Chronic kidney disease: Patient has stage IV CKD which has been stable at this time.  #2: Hyponatremia: Sodium is now corrected over 130.  Presently on salt tablets.  #3: Anemia: Anemia secondary to chronic kidney disease.  S/p transfusion.  #4: Gout: Patient has stage IV kidney disease and has been on allopurinol at 300 mg daily.  Will need to decrease it to 100 mg daily.  #5: Secondary hyperparathyroidism: Will check PTH, calcium and phosphorus levels.  Will continue to monitor.    LOS: [place], [facility] 1/6/20243:11 PM

## 2022-04-12 NOTE — Plan of Care (Signed)
  Problem: Clinical Measurements: Goal: Ability to maintain a body temperature in the normal range will improve Outcome: Progressing   Problem: Respiratory: Goal: Ability to maintain adequate ventilation will improve Outcome: Progressing   Problem: Coping: Goal: Level of anxiety will decrease Outcome: Progressing   Problem: Pain Managment: Goal: General experience of comfort will improve Outcome: Progressing   Problem: Safety: Goal: Ability to remain free from injury will improve Outcome: Progressing   Problem: Skin Integrity: Goal: Risk for impaired skin integrity will decrease Outcome: Progressing

## 2022-04-12 NOTE — Plan of Care (Signed)

## 2022-04-13 DIAGNOSIS — R319 Hematuria, unspecified: Secondary | ICD-10-CM

## 2022-04-13 DIAGNOSIS — D62 Acute posthemorrhagic anemia: Secondary | ICD-10-CM | POA: Diagnosis not present

## 2022-04-13 DIAGNOSIS — E871 Hypo-osmolality and hyponatremia: Secondary | ICD-10-CM | POA: Diagnosis not present

## 2022-04-13 DIAGNOSIS — N2889 Other specified disorders of kidney and ureter: Secondary | ICD-10-CM | POA: Diagnosis not present

## 2022-04-13 LAB — BASIC METABOLIC PANEL
Anion gap: 6 (ref 5–15)
BUN: 69 mg/dL — ABNORMAL HIGH (ref 8–23)
CO2: 17 mmol/L — ABNORMAL LOW (ref 22–32)
Calcium: 10.1 mg/dL (ref 8.9–10.3)
Chloride: 114 mmol/L — ABNORMAL HIGH (ref 98–111)
Creatinine, Ser: 3.11 mg/dL — ABNORMAL HIGH (ref 0.61–1.24)
GFR, Estimated: 20 mL/min — ABNORMAL LOW (ref >60)
Glucose, Bld: 102 mg/dL — ABNORMAL HIGH (ref 70–99)
Potassium: 5 mmol/L (ref 3.5–5.1)
Sodium: 137 mmol/L (ref 135–145)

## 2022-04-13 LAB — HEMOGLOBIN: Hemoglobin: 7.2 g/dL — ABNORMAL LOW (ref 13.0–17.0)

## 2022-04-13 LAB — PREPARE RBC (CROSSMATCH)

## 2022-04-13 MED ORDER — SODIUM CHLORIDE 0.9% IV SOLUTION: Freq: Once | INTRAVENOUS | Status: AC

## 2022-04-13 MED ORDER — FUROSEMIDE 10 MG/ML IJ SOLN
40.0000 mg | Freq: Once | INTRAMUSCULAR | Status: AC
  Administered 2022-04-13: 40 mg via INTRAVENOUS
  Filled 2022-04-13: qty 4

## 2022-04-13 NOTE — Progress Notes (Signed)
Progress Note   Patient: Troy Berry. UEA:[id number] DOB: October 27, 1945 DOA: 04/07/2022     5 DOS: the patient was seen and examined on 04/13/2022   Brief hospital course: Taken from H&P.  Troy Berry. is a 77 y.o. African-American male with medical history significant for stage III CKD, GERD, hypertension anemia, and dementia who presented to the emergency room with acute onset of hematuria in the setting of a suprapubic catheter along with blood around the stools per his wife discovered in the diaper.  He has a history of ongoing gross hematuria.  No melena was reported.  No nausea or vomiting.  ED course.  On arrival stable vitals, respiratory panel negative.  Pertinent labs with hyponatremia with sodium of 115, do not from 132 on 47/82/9562, metabolic acidosis with bicarb of 16 and chloride of 91, worsening renal function with BUN 70 and creatinine of 4.04 as compared to 49/3.97 on 12/25.  Lactic acid at 2>> 1.5.  CBC with hemoglobin 5, it was 8 on 12/25. EKG is sinus rhythm, prolonged PR interval and chronic Q waves inferiorly. CT chest, abdomen and pelvis with 1. Cardiomegaly with small bilateral pleural effusions. Small heterogeneous foci of ground-glass density in the left upper lobe and left lower lobe suspicious for pneumonia. 2. Increased density in the region of left renal collecting system and within the left ureter which appears dilated. Findings could be secondary to hemorrhage or infection. There are small left kidney stones. 3. Suspicion of hyperdense renal lesions superior pole left kidney, further assessment difficult without contrast, correlation with renal ultrasound could be attempted. 4. Small volume perihepatic ascites. Generalized mesenteric and subcutaneous edema suggesting anasarca. 5. Enlarged prostate with mass effect on the bladder. 6. Redemonstrated enlarged appearing left retroperitoneal lymph nodes which are either reactive or neoplastic in  etiology. 7. Large fluid collection within right groin hernia. 8. Gallstones. 9. Aortic atherosclerosis.  Patient received IV fluid with normal saline, Rocephin and Zithromax for concern of And blood transfusions were ordered.  1/2: Vitals stable, sodium improved to 118.  TSH within normal limit.  Renal function with some improvement to 61/3.66.  Urology was consulted.  Hemoglobin increased to 8.2 after getting 2 unit of PRBC.  Patient clinically appears dry to euvolemic but BNP at 1834 , markedly elevated from prior check of 395 44-monthago.  No history of heart failure or prior echocardiogram.  Echocardiogram showed normal EF. 1/3.  IV iron given.  Hypertonic saline started for hyponatremia.  Sodium up to 121 1/4.  Seen by palliative care 1/5.  Hemoglobin 7.0.  Transfusion of 1 unit of packed red blood cells and IV iron today.  Urology ordered an MRI of the abdomen for further evaluation 1/6.  MRI of the abdomen showing left kidney mass going down into the ureter, possible liver metastases, retroperitoneal adenopathy and possible bone metastases.  Hemoglobin up to 7.7. 1/7.  Hemoglobin down to 7.2.  Still with gross hematuria.  Will transfuse 1 unit of packed red blood cells.    Assessment and Plan: * Acute on chronic blood loss anemia Patient was transfused 3 units of packed red blood cells during the hospital course.  Last hemoglobin 7.7.  2 doses of IV Venofer on 1/3 and 1/5.  Patient will likely continue to bleed from his left kidney.  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  Case discussed with Dr. BRogue Bussingand  patient is not a good candidate for systemic treatment and recommended hospice.  I spoke with the patient's wife about hospice.  I tried to reach the patient's daughter but unable to do so currently.  CAP (community acquired pneumonia) Completed Rocephin and  Zithromax.  Hyponatremia Last sodium normal range.  Currently on salt tablets.  Received hypertonic saline during the hospital course.  Essential hypertension Hold Coreg with bradycardia.  Memory loss Suspect underlying dementia  Acute cystitis with hematuria Catheter related.  Klebsiella growing out of urine culture.  Sensitive to Rocephin.  Secondary to patient having a catheter, will have to give a longer course of antibiotics.  Switched Rocephin over to Keflex on 1/6.  Gout Continue allopurinol.  Dyslipidemia Continue Statin therapy.  Protein-calorie malnutrition, severe Continue supplements  CKD (chronic kidney disease), stage IV (HCC) Last creatinine 3.11 with a GFR of 20        Subjective:  Patient feels okay.  Offers no complaints.  No pain.  Still having hematuria.  Admitted with hematuria and anemia.  Physical Exam: Vitals:   04/13/22 0045 04/13/22 0457 04/13/22 0500 04/13/22 0839  BP: 131/62 (!) 117/58  138/66  Pulse: 84 79  75  Resp: '18 17  16  '$ Temp: 98.6 F (37 C) 98.4 F (36.9 C)  97.6 F (36.4 C)  TempSrc: Oral   Oral  SpO2: 99% 99%  100%  Weight:   72.3 kg   Height:       Physical Exam HENT:     Head: Normocephalic.     Mouth/Throat:     Pharynx: No oropharyngeal exudate.  Eyes:     General: Lids are normal.  Cardiovascular:     Rate and Rhythm: Normal rate and regular rhythm.     Heart sounds: Normal heart sounds, S1 normal and S2 normal.  Pulmonary:     Breath sounds: No decreased breath sounds, wheezing, rhonchi or rales.  Abdominal:     Palpations: Abdomen is soft.     Tenderness: There is no abdominal tenderness.  Genitourinary:    Comments: Still with gross hematuria in the Foley Musculoskeletal:     Right lower leg: No swelling.     Left lower leg: No swelling.  Skin:    General: Skin is warm.     Findings: No rash.  Neurological:     Mental Status: He is alert.     Data Reviewed: Hemoglobin 7.2, sodium 137,  creatinine 3.11  Family Communication: Spoke with patient's wife on the phone.  Tried to reach patient's daughter but unable to get her.  Disposition: Status is: Inpatient Remains inpatient appropriate because: Options are limited if I send him home he will continue to bleed and he will bounce right back into the hospital.  Talking to family about potential hospice. Planned Discharge Destination: To be determined    Time spent: 28 minutes  Author: Loletha Grayer, MD 04/13/2022 1:21 PM  For on call review [email or web address].

## 2022-04-13 NOTE — Progress Notes (Signed)
PT Cancellation Note  Patient Details Name: Troy Berry. MRN: [id number] DOB: [birth year]   Cancelled Treatment:    Reason Eval/Treat Not Completed: Medical issues which prohibited therapy  Pt receiving blood this pm.  Will resume at a later time/date as appropriate.  Chesley Noon 04/13/2022, 3:22 PM

## 2022-04-13 NOTE — Progress Notes (Signed)
[facility]  PROGRESS NOTE   Subjective:   Patient is lethargic but arousable.  Patient's wife and daughter at bedside.  Foley still draining gross hematuria.  Objective:  Vital signs: Blood pressure 138/66, pulse 75, temperature 97.6 F (36.4 C), temperature source Oral, resp. rate 16, height '5\' 8"'$  (1.727 m), weight 72.3 kg, SpO2 100 %.  Intake/Output Summary (Last 24 hours) at 04/13/2022 1355 Last data filed at 04/13/2022 1030 Gross per 24 hour  Intake 380 ml  Output 900 ml  Net -520 ml   Filed Weights   04/11/22 0500 04/12/22 0415 04/13/22 0500  Weight: 72.8 kg 70.3 kg 72.3 kg     Physical Exam: General:  No acute distress  Head:  Normocephalic, atraumatic. Moist oral mucosal membranes  Eyes:  Anicteric  Neck:  Supple  Lungs:   Clear to auscultation, normal effort  Heart:  S1S2 no rubs  Abdomen:   Soft, nontender, bowel sounds present  Extremities:  peripheral edema.  Neurologic: Lethargic.  Skin:  No lesions  Access:     Basic Metabolic Panel: Recent Labs  Lab 04/09/22 1006 04/09/22 1816 04/10/22 0720 04/10/22 1107 04/11/22 0235 04/11/22 0709 04/11/22 1109 04/11/22 1620 04/12/22 0446 04/13/22 0436  NA 121*   < > 125*   < > 129* 130* 131* 132* 134* 137  K 4.8  --  5.1  --  4.7  --   --   --  5.0 5.0  CL 99  --  103  --  107  --   --   --  111 114*  CO2 16*  --  13*  --  16*  --   --   --  16* 17*  GLUCOSE 83  --  79  --  111*  --   --   --  69* 102*  BUN 66*  --  65*  --  68*  --   --   --  66* 69*  CREATININE 3.58*  --  3.44*  --  3.32*  --   --   --  3.22* 3.11*  CALCIUM 8.9  --  9.0  --  9.1  --   --   --  9.7 10.1   < > = values in this interval not displayed.   GFR: Estimated Creatinine Clearance: 19.2 mL/min (A) (by C-G formula based on SCr of 3.11 mg/dL (H)).  Liver Function Tests: Recent Labs  Lab 04/07/22 2258  AST 41  ALT 31  ALKPHOS 253*  BILITOT 0.6  PROT 5.2*  ALBUMIN 2.1*   No results for input(s): "LIPASE",  "AMYLASE" in the last 168 hours. No results for input(s): "AMMONIA" in the last 168 hours.  CBC: Recent Labs  Lab 04/07/22 2258 04/08/22 0933 04/09/22 1316 04/10/22 0720 04/11/22 0235 04/11/22 1620 04/12/22 0446 04/13/22 0436  WBC 8.7 9.6 8.9 7.6  --   --  13.0*  --   NEUTROABS  --   --  7.1  --   --   --   --   --   HGB 5.0* 8.2* 7.9* 8.2* 7.0* 7.5* 7.7* 7.2*  HCT 15.4* 24.3* 23.5* 24.4*  --   --  23.0*  --   MCV 87.0 86.8 87.0 87.8  --   --  87.1  --   PLT 118* 125* 134* 147*  --   --  131*  --      HbA1C: Hgb A1c MFr Bld  Date/Time Value Ref Range Status  01/15/2022 05:45 AM 5.2 4.8 - 5.6 % Final    Comment:    (NOTE) Pre diabetes:          5.7%-6.4%  Diabetes:              >6.4%  Glycemic control for   <7.0% adults with diabetes     Urinalysis: No results for input(s): "COLORURINE", "LABSPEC", "PHURINE", "GLUCOSEU", "HGBUR", "BILIRUBINUR", "KETONESUR", "PROTEINUR", "UROBILINOGEN", "NITRITE", "LEUKOCYTESUR" in the last 72 hours.  Invalid input(s): "APPERANCEUR"    Imaging: MR ABDOMEN WO CONTRAST  Result Date: 04/12/2022 CLINICAL DATA:  Indeterminate renal mass/cyst. EXAM: MRI ABDOMEN WITHOUT CONTRAST TECHNIQUE: Multiplanar multisequence MR imaging was performed without the administration of intravenous contrast. COMPARISON:  CT AP 04/07/2022 FINDINGS: Exam detail is markedly diminished due to excessive motion artifact. Further, evaluation for solid organ pathology is limited reflecting lack of IV contrast material. Lower chest: Cardiac enlargement.  Bilateral pleural effusions. Hepatobiliary: Limited assessment due to excessive motion artifact. Diffusely heterogeneous appearance of the liver with multiple scattered areas of increased T2 signal. Reference lesion in segment 7 measures 1.7 x 1.5 cm, image 13/8. Reference lesion in segment 6 measures 1.5 x 1.2 cm, image 24/8. Multiple small stones are seen layering within the dependent portion of the gallbladder.  Assessment of gallbladder wall thickness is limited due to motion artifact. No intrahepatic bile duct dilatation. Diffuse periportal edema noted. No signs of common bile duct dilatation. Pancreas: No mass, inflammatory changes, or other parenchymal abnormality identified. Spleen: There is an indeterminate T2 hyperintense lesion within the spleen measures 1.7 cm. This contains a central area of low T2 signal. This is incompletely characterized without IV contrast. Adrenals/Urinary Tract: The adrenal glands are grossly unremarkable. Evaluation of the kidneys is markedly limited due to motion artifact. Bilateral kidney cysts identified. Asymmetric left-sided perinephric fat stranding noted. Within these limitations, there appears to be a mass occupying lesion within the upper pole and interpolar left kidney with extension into the collecting system and dilated left ureter, image 12/4 and image 27/8. Cannot excess patency of the left renal vein reflecting lack of IV contrast material and motion artifact. Urinary bladder is identified which appears partially decompressed around a suprapubic catheter and Foley catheter. Stomach/Bowel: No pathologic dilatation of the large or small bowel loops. Vascular/Lymphatic: No signs of infrarenal abdominal aortic aneurysm. Aortic atherosclerosis. Retroperitoneal adenopathy is identified: Reference left periaortic node measures 1.7 cm, image 17/8. Unchanged. Reference left periaortic node measures 2 cm, image 21/8. Unchanged. Other:  Small volume of abdominal ascites. Musculoskeletal: The bone marrow appears diffusely abnormal with multiple lesions throughout the lumbar spine and visualized bony pelvis worrisome for bone metastases. Reference lesion within the L4 vertebral body measures 1.8 cm, image 30/8. IMPRESSION: 1. Exam detail is markedly diminished due to excessive motion artifact. Further, evaluation for solid organ pathology is limited reflecting lack of IV contrast  material. 2. There appears to be a mass occupying lesion within the upper pole and interpolar left kidney with extension into the collecting system and dilated left ureter. Cannot excess patency of the left renal vein reflecting lack of IV contrast material and motion artifact. Imaging findings are suspicious for underlying malignancy. 3. Diffusely heterogeneous appearance of the liver with multiple scattered areas of increased T2 signal. Although incompletely characterized without IV contrast, findings are suspicious for diffuse liver metastases. 4. Retroperitoneal adenopathy. 5. The bone marrow appears diffusely abnormal with multiple lesions throughout the lumbar spine and visualized bony pelvis worrisome for bone metastases. 6. Small volume  of abdominal ascites. This may be secondary to fluid overload. 7. Cardiac enlargement and bilateral pleural effusions concerning for CHF. Bilateral pleural effusions. 8. Cholelithiasis. Electronically Signed   By: Kerby Moors M.D.   On: 04/12/2022 08:20     Medications:    sodium chloride Stopped (04/08/22 0054)    sodium chloride   Intravenous Once   allopurinol  300 mg Oral Daily   amLODipine  10 mg Oral Daily   azithromycin  500 mg Oral QHS   calcium-vitamin D  1 tablet Oral Daily   cephALEXin  250 mg Oral Q8H   Chlorhexidine Gluconate Cloth  6 each Topical Daily   cholecalciferol  1,000 Units Oral Daily   feeding supplement  237 mL Oral TID BM   ferrous sulfate  325 mg Oral Daily   finasteride  5 mg Oral Daily   multivitamin with minerals  1 tablet Oral Daily   pravastatin  40 mg Oral q1800   sodium chloride  1 g Oral TID WC    Assessment/ Plan:     77 y.o. male with multiple medical problems including atonic bladder status post suprapubic catheter, chronic kidney disease, anemia, hypertension, gout presented to the emergency room with coffee ground emesis and hematuria.  Patient has been admitted for CAP (community acquired pneumonia)  [J18.9] Symptomatic anemia [D64.9] Hypotension, unspecified hypotension type [I95.9] Anemia, unspecified type [D64.9] Dyspnea, unspecified type [R06.00] Pneumonia due to infectious organism, unspecified laterality, unspecified part of lung [J18.9] Hematuria, unspecified type [R31.9]  #1: Chronic kidney disease: Patient has stage IV CKD which has been stable at this time. Patient had an MRI of the abdomen which shows a left kidney mass, possible liver metastases and possible bone metastases and retroperitoneal lymphadenopathy.  Patient had oncology consult done yesterday.   #2: Hyponatremia: Sodium is now corrected over 130.  Presently on salt tablets.   #3: Anemia: Anemia secondary to chronic kidney disease on gross hematuria.  To be transfused again today.Marland Kitchen   #4: Gout: Patient has stage IV kidney disease and has been on allopurinol at 300 mg daily.  Will need to decrease it to 100 mg daily.   #5: Secondary hyperparathyroidism: Will monitor.  .  #6: Possible urosepsis: Presently on antibiotics.   Overall prognosis seems to be poor.  Will continue to monitor.    LOS: [place], [facility] 1/7/20241:55 PM

## 2022-04-14 DIAGNOSIS — Z515 Encounter for palliative care: Secondary | ICD-10-CM | POA: Diagnosis not present

## 2022-04-14 DIAGNOSIS — C7951 Secondary malignant neoplasm of bone: Secondary | ICD-10-CM

## 2022-04-14 DIAGNOSIS — N2889 Other specified disorders of kidney and ureter: Secondary | ICD-10-CM | POA: Diagnosis not present

## 2022-04-14 DIAGNOSIS — C799 Secondary malignant neoplasm of unspecified site: Secondary | ICD-10-CM

## 2022-04-14 DIAGNOSIS — D62 Acute posthemorrhagic anemia: Secondary | ICD-10-CM | POA: Diagnosis not present

## 2022-04-14 DIAGNOSIS — E875 Hyperkalemia: Secondary | ICD-10-CM

## 2022-04-14 LAB — BASIC METABOLIC PANEL
Anion gap: 7 (ref 5–15)
BUN: 75 mg/dL — ABNORMAL HIGH (ref 8–23)
CO2: 17 mmol/L — ABNORMAL LOW (ref 22–32)
Calcium: 10.1 mg/dL (ref 8.9–10.3)
Chloride: 113 mmol/L — ABNORMAL HIGH (ref 98–111)
Creatinine, Ser: 3.13 mg/dL — ABNORMAL HIGH (ref 0.61–1.24)
GFR, Estimated: 20 mL/min — ABNORMAL LOW (ref >60)
Glucose, Bld: 102 mg/dL — ABNORMAL HIGH (ref 70–99)
Potassium: 5.3 mmol/L — ABNORMAL HIGH (ref 3.5–5.1)
Sodium: 137 mmol/L (ref 135–145)

## 2022-04-14 LAB — HEMOGLOBIN: Hemoglobin: 7.4 g/dL — ABNORMAL LOW (ref 13.0–17.0)

## 2022-04-14 LAB — PREPARE RBC (CROSSMATCH)

## 2022-04-14 MED ORDER — GLYCOPYRROLATE 0.2 MG/ML IJ SOLN: 0.2000 mg | INTRAMUSCULAR | Status: AC | PRN

## 2022-04-14 MED ORDER — HALOPERIDOL LACTATE 5 MG/ML IJ SOLN: 0.5000 mg | INTRAMUSCULAR | Status: AC | PRN

## 2022-04-14 MED ORDER — BIOTENE DRY MOUTH MT LIQD: 15.0000 mL | OROMUCOSAL | Status: AC | PRN

## 2022-04-14 MED ORDER — ONDANSETRON HCL 4 MG/2ML IJ SOLN: 4.0000 mg | Freq: Four times a day (QID) | INTRAMUSCULAR | 0 refills | Status: AC | PRN

## 2022-04-14 MED ORDER — ENSURE ENLIVE PO LIQD: 237.0000 mL | Freq: Three times a day (TID) | ORAL | 12 refills | Status: AC

## 2022-04-14 MED ORDER — HALOPERIDOL LACTATE 5 MG/ML IJ SOLN: 0.5000 mg | INTRAMUSCULAR | Status: DC | PRN

## 2022-04-14 MED ORDER — ACETAMINOPHEN 650 MG RE SUPP: 650.0000 mg | Freq: Four times a day (QID) | RECTAL | 0 refills | Status: AC | PRN

## 2022-04-14 MED ORDER — FUROSEMIDE 10 MG/ML IJ SOLN
40.0000 mg | Freq: Once | INTRAMUSCULAR | Status: AC
  Administered 2022-04-14: 40 mg via INTRAVENOUS
  Filled 2022-04-14: qty 4

## 2022-04-14 MED ORDER — ACETAMINOPHEN 325 MG PO TABS
650.0000 mg | ORAL_TABLET | Freq: Once | ORAL | Status: AC
  Administered 2022-04-14: 650 mg via ORAL
  Filled 2022-04-14: qty 2

## 2022-04-14 MED ORDER — TRAZODONE HCL 50 MG PO TABS: 25.0000 mg | ORAL_TABLET | Freq: Every evening | ORAL | Status: AC | PRN

## 2022-04-14 MED ORDER — HALOPERIDOL LACTATE 2 MG/ML PO CONC: 0.5000 mg | ORAL | Status: DC | PRN

## 2022-04-14 MED ORDER — GLYCOPYRROLATE 1 MG PO TABS: 1.0000 mg | ORAL_TABLET | ORAL | Status: DC | PRN

## 2022-04-14 MED ORDER — BIOTENE DRY MOUTH MT LIQD: 15.0000 mL | OROMUCOSAL | Status: DC | PRN

## 2022-04-14 MED ORDER — POLYVINYL ALCOHOL 1.4 % OP SOLN: 1.0000 [drp] | Freq: Four times a day (QID) | OPHTHALMIC | Status: DC | PRN

## 2022-04-14 MED ORDER — GLYCOPYRROLATE 0.2 MG/ML IJ SOLN: 0.2000 mg | INTRAMUSCULAR | Status: DC | PRN

## 2022-04-14 MED ORDER — POLYVINYL ALCOHOL 1.4 % OP SOLN: 1.0000 [drp] | Freq: Four times a day (QID) | OPHTHALMIC | 0 refills | Status: AC | PRN

## 2022-04-14 MED ORDER — HYDROMORPHONE HCL 1 MG/ML IJ SOLN: 0.5000 mg | INTRAMUSCULAR | 0 refills | Status: AC | PRN

## 2022-04-14 MED ORDER — SODIUM ZIRCONIUM CYCLOSILICATE 10 G PO PACK
10.0000 g | PACK | Freq: Once | ORAL | Status: AC
  Administered 2022-04-14: 10 g via ORAL
  Filled 2022-04-14: qty 1

## 2022-04-14 MED ORDER — CHLORHEXIDINE GLUCONATE CLOTH 2 % EX PADS: 6.0000 | MEDICATED_PAD | Freq: Every day | CUTANEOUS | Status: AC

## 2022-04-14 MED ORDER — ACETAMINOPHEN 325 MG PO TABS: 650.0000 mg | ORAL_TABLET | Freq: Four times a day (QID) | ORAL | Status: AC | PRN

## 2022-04-14 MED ORDER — HALOPERIDOL 0.5 MG PO TABS: 0.5000 mg | ORAL_TABLET | ORAL | Status: DC | PRN

## 2022-04-14 MED ORDER — HYDROMORPHONE HCL 1 MG/ML IJ SOLN: 0.5000 mg | INTRAMUSCULAR | Status: DC | PRN

## 2022-04-14 MED ORDER — SODIUM CHLORIDE 0.9% IV SOLUTION: Freq: Once | INTRAVENOUS | Status: AC

## 2022-04-14 NOTE — Progress Notes (Signed)
Occupational Therapy Treatment Patient Details Name: Troy Berry. MRN: [id number] DOB: Dec 19, 1945 Today's Date: 04/14/2022   History of present illness Troy Berry. is a 77 y.o. male with a past medical history of anemia, CKD, hypertension, presents to the emergency department from home for shortness of breath and cough.  According to the family members patient is largely cared for by home health workers as the patient is bedbound.  States since this morning he has a very wet sounding cough and appears to have difficulty breathing at times.  They also state the patient's catheter was not draining correctly although currently is draining urine.  Patient has chronic hematuria per family for at least the last several months this is not new.   OT comments  Upon entering the room, pt is in R side lying position in bed with eyes closed. Pt does not communicate with therapist during session but does moan and grimace with rolling in bed for hygiene needs. Pt notes to have had BM in bed and needing total A of 2 for rolling and hygiene. Pt unable to actively participate in therapeutic intervention and does not appear to be appropriate for therapy at this time. OT repositioned pt in bed with +2 assistance and pt resistive to movement. OT to complete orders and sign off at this time. Please re-consult if pt is able to actively participate.    Recommendations for follow up therapy are one component of a multi-disciplinary discharge planning process, led by the attending physician.  Recommendations may be updated based on patient status, additional functional criteria and insurance authorization.    Follow Up Recommendations  Follow physician's recommendations for discharge plan and follow up therapies     Assistance Recommended at Discharge Frequent or constant Supervision/Assistance  Patient can return home with the following  Two people to help with bathing/dressing/bathroom;Two people to help  with walking and/or transfers;Help with stairs or ramp for entrance;Assist for transportation;Direct supervision/assist for financial management;Direct supervision/assist for medications management   Equipment Recommendations  Hospital bed       Precautions / Restrictions Precautions Precautions: Fall       Mobility Bed Mobility Overal bed mobility: Needs Assistance Bed Mobility: Rolling Rolling: Total assist, +2 for physical assistance              Transfers                   General transfer comment: not attempted secondary to safety concerns         ADL either performed or assessed with clinical judgement   ADL Overall ADL's : Needs assistance/impaired                                       General ADL Comments: total A of 2 to roll in bed and assist with peri hygiene    Extremity/Trunk Assessment Upper Extremity Assessment Upper Extremity Assessment: Generalized weakness   Lower Extremity Assessment Lower Extremity Assessment: Generalized weakness        Vision Patient Visual Report: No change from baseline            Cognition Arousal/Alertness: Lethargic Behavior During Therapy: Flat affect Overall Cognitive Status: History of cognitive impairments - at baseline  General Comments: does not verbally communicate with therapist                   Pertinent Vitals/ Pain       Pain Assessment Pain Assessment: Faces Faces Pain Scale: Hurts whole lot Pain Location: discomfort with bed mobility Pain Descriptors / Indicators: Discomfort Pain Intervention(s): Repositioned      Progress Toward Goals  OT Goals(current goals can now be found in the care plan section)  Progress towards OT goals: Not progressing toward goals - comment  Acute Rehab OT Goals Patient Stated Goal: none stated OT Goal Formulation: Patient unable to participate in goal setting Time For Goal  Achievement: 04/24/22 Potential to Achieve Goals: Good  Plan Other (comment) (pt unable to actively participate in OT intervention)       AM-PAC OT "6 Clicks" Daily Activity     Outcome Measure   Help from another person eating meals?: Total Help from another person taking care of personal grooming?: Total Help from another person toileting, which includes using toliet, bedpan, or urinal?: Total Help from another person bathing (including washing, rinsing, drying)?: Total Help from another person to put on and taking off regular upper body clothing?: Total Help from another person to put on and taking off regular lower body clothing?: Total 6 Click Score: 6    End of Session    OT Visit Diagnosis: Muscle weakness (generalized) (M62.81)   Activity Tolerance Patient limited by fatigue   Patient Left in bed;with call bell/phone within reach;with bed alarm set   Nurse Communication Mobility status        Time: 0488-8916 OT Time Calculation (min): 13 min  Charges: OT General Charges $OT Visit: 1 Visit OT Treatments $Self Care/Home Management : 8-22 mins  Darleen Crocker, MS, OTR/L , CBIS ascom [phone]  04/14/22, 2:46 PM

## 2022-04-14 NOTE — TOC Progression Note (Addendum)
Transition of Care (TOC) - Progression Note    Patient Details  Name: Troy Berry. MRN: [id number] Date of Birth: Jul 30, 1945  Transition of Care [facility]) CM/SW Contact  Candie Chroman, LCSW Phone Number: 04/14/2022, 9:57 AM  Clinical Narrative:   Received call from daughter. They are looking into starting home hospice services but she wants to try to find an agency that can give patient blood if needed. [facility] hospice liaison is checking on this. Sent secure chat to [employer]. Left messages for [place].  11:45 am: Per MD, wife and son-in-law are now considering the hospice facility. CSW met with them and discussed. Sent referral to Daphene Calamity with [place].  Expected Discharge Plan: [place] Barriers to Discharge: Continued Medical Work up  Expected Discharge Plan and Services     Post Acute Care Choice: Resumption of Svcs/PTA Provider Living arrangements for the past 2 months: Single Family Home                           HH Arranged: RN, PT, OT, Nurse's Aide, Social Work [employer] Agency: [place] Date [place]: 04/10/22   Representative spoke with at [place]: [place] ([place]) Interventions SDOH Screenings   Food Insecurity: No Food Insecurity (04/09/2022)  Housing: Low Risk  (04/09/2022)  Transportation Needs: Unmet Transportation Needs (04/09/2022)  Utilities: Not At Risk (04/09/2022)  Financial Resource Strain: Low Risk  (04/28/2018)  Physical Activity: Inactive (04/28/2018)  Social Connections: Moderately Isolated (04/28/2018)  Stress: No Stress Concern Present (04/28/2018)  Tobacco Use: Medium Risk (04/07/2022)    Readmission Risk Interventions    04/10/2022    9:31 AM  Readmission Risk Prevention Plan  Transportation Screening Complete  PCP or Specialist Appt within 3-5 Days Complete  HRI or [place] Complete  Social Work Consult for [place]  Planning/Counseling Complete  Palliative Care Screening Complete  Medication Review Press [employer]) Complete

## 2022-04-14 NOTE — Consult Note (Signed)
[place] at [facility] [phone] Fax:([phone]   Name: Troy Berry. Date: 04/14/2022 MRN: [id number]  DOB: 1945-04-26  Patient Care Team: [facility] as PCP - General (General Practice)    REASON FOR CONSULTATION: Troy Berry. is a 77 y.o. male with multiple medical problems including dementia, stage IV CKD, history of atonic bladder requiring suprapubic catheter.  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  Patient was treated with antibiotics but did not improve with conservative management and subsequently underwent exploratory laparoscopy and reduction of an internal hernia on 01/07/2022.  His hospital course was complicated by wound dehiscence and patient was taken back to the OR on 01/10/2022 for wound closure.  Patient was ultimately discharged to SNF.  He has subsequently had multiple ED visits for variety of complaints including constipation, hematuria, and rectal bleeding.  Patient was admitted to the hospital in 04/08/2022 with hematuria, ultimately with CT chest, abdomen, and pelvis revealing hyperdense renal lesions in the left kidney, and a large left retroperitoneal lymph nodes.  This was followed by an abdominal MRI which showed a left kidney mass with extension into the collecting system and dilated left ureter, diffusely heterogeneous appearance of the liver suspicious for diffuse liver metastases, retroperitoneal adenopathy, and diffusely abnormal bone marrow concerning for multiple lesions throughout the lumbar spine and bony pelvis.  Patient has had persistent hematuria requiring multiple blood transfusions.  Palliative care was consulted to address goals.  SOCIAL HISTORY:     reports that he quit  smoking about 24 years ago. His smoking use included cigarettes. He has been exposed to tobacco smoke. He has never used smokeless tobacco. He reports current alcohol use. He reports that he does not use drugs.  Patient is married and lives at home with his wife.  He has a daughter and his son-in-law who are involved.  ADVANCE DIRECTIVES:  Not on file  CODE STATUS: DNR  PAST MEDICAL HISTORY: Past Medical History:  Diagnosis Date   Anemia    CKD (chronic kidney disease), stage III ([place])    Gout    Hypertension    Urinary retention    in-dwelling [place] since Nov 2019, sees Dr. Hollice Espy    PAST SURGICAL HISTORY:  Past Surgical History:  Procedure Laterality Date   IR CATHETER TUBE CHANGE  12/30/2021   IR RADIOLOGIST EVAL & MGMT  01/01/2022   LAPAROTOMY N/A 01/07/2022   Procedure: EXPLORATORY LAPAROTOMY;  Surgeon: Herbert Pun, MD;  Location: [facility] ORS;  Service: General;  Laterality: N/A;   LAPAROTOMY N/A 01/10/2022   Procedure: EXPLORATORY LAPAROTOMY, Closure of Opened Abdominal Incision Wound;  Surgeon: Herbert Pun, MD;  Location: [facility] ORS;  Service: General;  Laterality: N/A;    HEMATOLOGY/ONCOLOGY HISTORY:  Oncology History   No history exists.    ALLERGIES:  is allergic to ace inhibitors.  MEDICATIONS:  Current Facility-Administered Medications  Medication Dose Route Frequency Provider Last Rate Last Admin   0.9 %  sodium chloride infusion (Manually program via Guardrails IV Fluids)   Intravenous Once Loletha Grayer, MD       0.9 %  sodium chloride infusion  10 mL/hr Intravenous Once Harvest Dark, MD   Held at 04/08/22 0054   acetaminophen (TYLENOL) tablet 650 mg  650 mg Oral Q6H PRN Mansy,  Arvella Merles, MD   650 mg at 04/13/22 1532   Or   acetaminophen (TYLENOL) suppository 650 mg  650 mg Rectal Q6H PRN Mansy, Jan A, MD       acetaminophen (TYLENOL) tablet 650 mg  650 mg Oral Once Loletha Grayer, MD       allopurinol (ZYLOPRIM) tablet 300 mg   300 mg Oral Daily Mansy, Jan A, MD   300 mg at 04/14/22 0912   amLODipine (NORVASC) tablet 10 mg  10 mg Oral Daily Mansy, Jan A, MD   10 mg at 04/14/22 0912   cephALEXin (KEFLEX) capsule 250 mg  250 mg Oral Q8H Loletha Grayer, MD   250 mg at 04/14/22 2831   Chlorhexidine Gluconate Cloth 2 % PADS 6 each  6 each Topical Daily Loletha Grayer, MD   6 each at 04/14/22 0914   feeding supplement (ENSURE ENLIVE / ENSURE PLUS) liquid 237 mL  237 mL Oral TID BM Loletha Grayer, MD   237 mL at 04/14/22 0914   furosemide (LASIX) injection 40 mg  40 mg Intravenous Once Loletha Grayer, MD       HYDROmorphone (DILAUDID) injection 0.5 mg  0.5 mg Intravenous Q2H PRN Marisa Hufstetler, Kirt Boys, NP       magnesium hydroxide (MILK OF MAGNESIA) suspension 30 mL  30 mL Oral Daily PRN Mansy, Jan A, MD       ondansetron [facility]) tablet 4 mg  4 mg Oral Q6H PRN Mansy, Jan A, MD       Or   ondansetron [facility]) injection 4 mg  4 mg Intravenous Q6H PRN Mansy, Jan A, MD       polyethylene glycol (MIRALAX / GLYCOLAX) packet 17 g  17 g Oral Daily PRN Mansy, Jan A, MD       sodium chloride tablet 1 g  1 g Oral TID WC Colon Flattery, NP   1 g at 04/14/22 0913   traZODone (DESYREL) tablet 25 mg  25 mg Oral QHS PRN Mansy, Jan A, MD        VITAL SIGNS: BP (!) 154/66 (BP Location: Left Arm)   Pulse 71   Temp 98.2 F (36.8 C) (Oral)   Resp 20   Ht '5\' 8"'$  (1.727 m)   Wt 152 lb 8.9 oz (69.2 kg)   SpO2 100%   BMI 23.20 kg/m  Filed Weights   04/12/22 0415 04/13/22 0500 04/14/22 0539  Weight: 154 lb 15.7 oz (70.3 kg) 159 lb 6.3 oz (72.3 kg) 152 lb 8.9 oz (69.2 kg)    Estimated body mass index is 23.2 kg/m as calculated from the following:   Height as of this encounter: '5\' 8"'$  (1.727 m).   Weight as of this encounter: 152 lb 8.9 oz (69.2 kg).  LABS: CBC:    Component Value Date/Time   WBC 13.0 (H) 04/12/2022 0446   HGB 7.4 (L) 04/14/2022 0400   HGB 8.3 (L) 11/24/2017 1040   HCT 23.0 (L) 04/12/2022 0446   HCT 26.3 (L)  11/24/2017 1040   PLT 131 (L) 04/12/2022 0446   PLT 110 (L) 09/26/2011 1609   MCV 87.1 04/12/2022 0446   MCV 80 11/24/2017 1040   MCV 92 09/26/2011 1609   NEUTROABS 7.1 04/09/2022 1316   NEUTROABS 3.7 11/24/2017 1040   LYMPHSABS 0.5 (L) 04/09/2022 1316   LYMPHSABS 0.9 11/24/2017 1040   MONOABS 1.1 (H) 04/09/2022 1316   EOSABS 0.1 04/09/2022 1316   EOSABS 0.2 11/24/2017 1040   BASOSABS 0.0  04/09/2022 1316   BASOSABS 0.0 11/24/2017 1040   Comprehensive Metabolic Panel:    Component Value Date/Time   NA 137 04/14/2022 0400   NA 141 11/24/2017 1040   NA 136 09/26/2011 1609   K 5.3 (H) 04/14/2022 0400   K 3.0 (L) 09/26/2011 1609   CL 113 (H) 04/14/2022 0400   CL 100 09/26/2011 1609   CO2 17 (L) 04/14/2022 0400   CO2 21 09/26/2011 1609   BUN 75 (H) 04/14/2022 0400   BUN 24 11/24/2017 1040   BUN 10 09/26/2011 1609   CREATININE 3.13 (H) 04/14/2022 0400   CREATININE 1.57 (H) 09/26/2011 1609   GLUCOSE 102 (H) 04/14/2022 0400   GLUCOSE 211 (H) 09/26/2011 1609   CALCIUM 10.1 04/14/2022 0400   CALCIUM 9.2 09/26/2011 1609   AST 41 04/07/2022 2258   ALT 31 04/07/2022 2258   ALKPHOS 253 (H) 04/07/2022 2258   BILITOT 0.6 04/07/2022 2258   BILITOT 0.2 11/24/2017 1040   PROT 5.2 (L) 04/07/2022 2258   PROT 6.8 11/24/2017 1040   ALBUMIN 2.1 (L) 04/07/2022 2258   ALBUMIN 3.4 (L) 11/24/2017 1040    RADIOGRAPHIC STUDIES: MR ABDOMEN WO CONTRAST  Result Date: 04/12/2022 CLINICAL DATA:  Indeterminate renal mass/cyst. EXAM: MRI ABDOMEN WITHOUT CONTRAST TECHNIQUE: Multiplanar multisequence MR imaging was performed without the administration of intravenous contrast. COMPARISON:  CT AP 04/07/2022 FINDINGS: Exam detail is markedly diminished due to excessive motion artifact. Further, evaluation for solid organ pathology is limited reflecting lack of IV contrast material. Lower chest: Cardiac enlargement.  Bilateral pleural effusions. Hepatobiliary: Limited assessment due to excessive motion  artifact. Diffusely heterogeneous appearance of the liver with multiple scattered areas of increased T2 signal. Reference lesion in segment 7 measures 1.7 x 1.5 cm, image 13/8. Reference lesion in segment 6 measures 1.5 x 1.2 cm, image 24/8. Multiple small stones are seen layering within the dependent portion of the gallbladder. Assessment of gallbladder wall thickness is limited due to motion artifact. No intrahepatic bile duct dilatation. Diffuse periportal edema noted. No signs of common bile duct dilatation. Pancreas: No mass, inflammatory changes, or other parenchymal abnormality identified. Spleen: There is an indeterminate T2 hyperintense lesion within the spleen measures 1.7 cm. This contains a central area of low T2 signal. This is incompletely characterized without IV contrast. Adrenals/Urinary Tract: The adrenal glands are grossly unremarkable. Evaluation of the kidneys is markedly limited due to motion artifact. Bilateral kidney cysts identified. Asymmetric left-sided perinephric fat stranding noted. Within these limitations, there appears to be a mass occupying lesion within the upper pole and interpolar left kidney with extension into the collecting system and dilated left ureter, image 12/4 and image 27/8. Cannot excess patency of the left renal vein reflecting lack of IV contrast material and motion artifact. Urinary bladder is identified which appears partially decompressed around a suprapubic catheter and Foley catheter. Stomach/Bowel: No pathologic dilatation of the large or small bowel loops. Vascular/Lymphatic: No signs of infrarenal abdominal aortic aneurysm. Aortic atherosclerosis. Retroperitoneal adenopathy is identified: Reference left periaortic node measures 1.7 cm, image 17/8. Unchanged. Reference left periaortic node measures 2 cm, image 21/8. Unchanged. Other:  Small volume of abdominal ascites. Musculoskeletal: The bone marrow appears diffusely abnormal with multiple lesions throughout  the lumbar spine and visualized bony pelvis worrisome for bone metastases. Reference lesion within the L4 vertebral body measures 1.8 cm, image 30/8. IMPRESSION: 1. Exam detail is markedly diminished due to excessive motion artifact. Further, evaluation for solid organ pathology is limited reflecting lack  of IV contrast material. 2. There appears to be a mass occupying lesion within the upper pole and interpolar left kidney with extension into the collecting system and dilated left ureter. Cannot excess patency of the left renal vein reflecting lack of IV contrast material and motion artifact. Imaging findings are suspicious for underlying malignancy. 3. Diffusely heterogeneous appearance of the liver with multiple scattered areas of increased T2 signal. Although incompletely characterized without IV contrast, findings are suspicious for diffuse liver metastases. 4. Retroperitoneal adenopathy. 5. The bone marrow appears diffusely abnormal with multiple lesions throughout the lumbar spine and visualized bony pelvis worrisome for bone metastases. 6. Small volume of abdominal ascites. This may be secondary to fluid overload. 7. Cardiac enlargement and bilateral pleural effusions concerning for CHF. Bilateral pleural effusions. 8. Cholelithiasis. Electronically Signed   By: Kerby Moors M.D.   On: 04/12/2022 08:20   ECHOCARDIOGRAM COMPLETE  Result Date: 04/09/2022    ECHOCARDIOGRAM REPORT   Patient Name:   Mehran Guderian. Date of Exam: 04/09/2022 Medical Rec #:  [id number]            Height:       68.0 in Accession #:    [id number]           Weight:       148.0 lb Date of Birth:  1946/01/22            BSA:          1.798 m Patient Age:    43 years             BP:           126/52 mmHg Patient Gender: M                    HR:           52 bpm. Exam Location:  [facility] Procedure: 2D Echo, Cardiac Doppler and Color Doppler Indications:     Abnormal ECG R94.31  History:         Patient has no prior history of  Echocardiogram examinations.                  Risk Factors:Hypertension. CKD.  Sonographer:     Sherrie Sport Referring Phys:  [id number] [facility] AMIN Diagnosing Phys: Ida Rogue MD  Sonographer Comments: Suboptimal parasternal window. IMPRESSIONS  1. Challenging images  2. Left ventricular ejection fraction, by estimation, is 55 to 60%. The left ventricle has normal function. The left ventricle has no regional wall motion abnormalities. There is mild left ventricular hypertrophy, unable to exclude moderate hypertrophy of the inferior and lateral wall. Left ventricular diastolic parameters are consistent with Grade I diastolic dysfunction (impaired relaxation).  3. Right ventricular systolic function is mildly reduced, though not well visualized. The right ventricular size is normal. There is normal pulmonary artery systolic pressure. The estimated right ventricular systolic pressure is 63.1 mmHg.  4. Left atrial size was mild to moderately dilated.  5. The mitral valve is normal in structure. Moderate mitral valve regurgitation. No evidence of mitral stenosis.  6. The aortic valve is normal in structure. Aortic valve regurgitation is moderate to severe. No aortic stenosis is present.  7. There is severe dilatation of the aortic root, measuring 49 mm.Ascending aorta not well visulized.  8. The inferior vena cava is normal in size with greater than 50% respiratory variability, suggesting right atrial pressure of 3 mmHg. FINDINGS  Left Ventricle: Left ventricular ejection fraction,  by estimation, is 55 to 60%. The left ventricle has normal function. The left ventricle has no regional wall motion abnormalities. The left ventricular internal cavity size was normal in size. There is  mild left ventricular hypertrophy. Left ventricular diastolic parameters are consistent with Grade I diastolic dysfunction (impaired relaxation). Right Ventricle: The right ventricular size is normal. No increase in right ventricular wall  thickness. Right ventricular systolic function is mildly reduced. There is normal pulmonary artery systolic pressure. The tricuspid regurgitant velocity is 2.78 m/s, and with an assumed right atrial pressure of 5 mmHg, the estimated right ventricular systolic pressure is 16.1 mmHg. Left Atrium: Left atrial size was mild to moderately dilated. Right Atrium: Right atrial size was normal in size. Pericardium: There is no evidence of pericardial effusion. Mitral Valve: The mitral valve is normal in structure. There is mild calcification of the mitral valve leaflet(s). Mild mitral annular calcification. Moderate mitral valve regurgitation. No evidence of mitral valve stenosis. Tricuspid Valve: The tricuspid valve is normal in structure. Tricuspid valve regurgitation is not demonstrated. No evidence of tricuspid stenosis. Aortic Valve: The aortic valve is normal in structure. Aortic valve regurgitation is moderate to severe. Aortic regurgitation PHT measures 433 msec. No aortic stenosis is present. Aortic valve mean gradient measures 4.0 mmHg. Aortic valve peak gradient measures 6.7 mmHg. Aortic valve area, by VTI measures 2.68 cm. Pulmonic Valve: The pulmonic valve was normal in structure. Pulmonic valve regurgitation is not visualized. No evidence of pulmonic stenosis. Aorta: The aortic root is normal in size and structure. There is severe dilatation of the aortic root, measuring 49 mm. Venous: The inferior vena cava is normal in size with greater than 50% respiratory variability, suggesting right atrial pressure of 3 mmHg. IAS/Shunts: No atrial level shunt detected by color flow Doppler.  LEFT VENTRICLE PLAX 2D LVIDd:         4.90 cm   Diastology LVIDs:         3.50 cm   LV e' medial:    5.22 cm/s LV PW:         1.10 cm   LV E/e' medial:  14.9 LV IVS:        1.20 cm   LV e' lateral:   10.80 cm/s LVOT diam:     2.10 cm   LV E/e' lateral: 7.2 LV SV:         81 LV SV Index:   45 LVOT Area:     3.46 cm  RIGHT VENTRICLE  RV Basal diam:  2.50 cm RV Mid diam:    3.10 cm RV S prime:     15.90 cm/s TAPSE (M-mode): 1.9 cm LEFT ATRIUM           Index        RIGHT ATRIUM           Index LA diam:      4.10 cm 2.28 cm/m   RA Area:     10.80 cm LA Vol (A4C): 76.2 ml 42.37 ml/m  RA Volume:   15.90 ml  8.84 ml/m  AORTIC VALVE AV Area (Vmax):    3.52 cm AV Area (Vmean):   2.98 cm AV Area (VTI):     2.68 cm AV Vmax:           129.00 cm/s AV Vmean:          95.200 cm/s AV VTI:            0.301 m AV Peak Grad:  6.7 mmHg AV Mean Grad:      4.0 mmHg LVOT Vmax:         131.00 cm/s LVOT Vmean:        81.800 cm/s LVOT VTI:          0.233 m LVOT/AV VTI ratio: 0.77 AI PHT:            433 msec  AORTA Ao Root diam: 5.07 cm MITRAL VALVE                TRICUSPID VALVE MV Area (PHT): 2.70 cm     TR Peak grad:   30.9 mmHg MV Decel Time: 281 msec     TR Vmax:        278.00 cm/s MV E velocity: 77.60 cm/s MV A velocity: 123.00 cm/s  SHUNTS MV E/A ratio:  0.63         Systemic VTI:  0.23 m                             Systemic Diam: 2.10 cm Ida Rogue MD Electronically signed by Ida Rogue MD Signature Date/Time: 04/09/2022/12:44:09 PM    Final    US RENAL  Result Date: 04/08/2022 CLINICAL DATA:  Hydronephrosis, left.  Renal mass on CT. EXAM: RENAL / URINARY TRACT ULTRASOUND COMPLETE COMPARISON:  Noncontrast abdominal CT 04/07/2022 and 03/05/2022. Renal ultrasound 12/21/2017. FINDINGS: Right Kidney: Renal measurements: 10.1 x 4.0 x 4.7 cm = volume: 99.1 mL. Cortical thinning with increased cortical echogenicity. Small renal cysts measuring up to 1.8 cm in diameter. No hydronephrosis. Left Kidney: Renal measurements: 12.3 x 6.1 x 5.7 cm = volume: 221.4 mL. Multiple cysts are noted, measuring up to 4.2 cm in the upper pole. No discrete mass is seen to correspond with the abnormality on recent CT. Bladder: Suprapubic catheter in place. The urinary bladder is mildly distended. Other: Ascites and a small left pleural effusion are noted. The prostate  gland is moderately enlarged. Numerous hypoechoic liver lesions are noted, incompletely characterized. Examination limited by patient condition. IMPRESSION: 1. No discrete left renal mass identified by ultrasound to correspond with the abnormality on recent CT's. CT finding remains indeterminate and could reflect parenchymal hemorrhage or a hemorrhagic mass. Recommend further evaluation with dedicated renal CT or MRI (without and with contrast). 2. Bilateral renal cysts. 3. Right renal cortical thinning and increased cortical echogenicity, nonspecific but can be seen with chronic medical renal disease. 4. Ascites and small left pleural effusion. 5. Numerous hypoechoic liver lesions, incompletely characterized. Electronically Signed   By: Richardean Sale M.D.   On: 04/08/2022 10:33   CT CHEST ABDOMEN PELVIS WO CONTRAST  Result Date: 04/07/2022 CLINICAL DATA:  Cough EXAM: CT CHEST, ABDOMEN AND PELVIS WITHOUT CONTRAST TECHNIQUE: Multidetector CT imaging of the chest, abdomen and pelvis was performed following the standard protocol without IV contrast. RADIATION DOSE REDUCTION: This exam was performed according to the departmental dose-optimization program which includes automated exposure control, adjustment of the mA and/or kV according to patient size and/or use of iterative reconstruction technique. COMPARISON:  Chest x-ray 04/07/2022, CT 03/05/2022 FINDINGS: CT CHEST FINDINGS Cardiovascular: Limited evaluation without intravenous contrast. Advanced aortic atherosclerosis. 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 Extensive coronary vascular calcifications. Cardiomegaly. No pericardial effusion. Mediastinum/Nodes: Midline trachea. No thyroid mass. Numerous calcified mediastinal and hilar nodes as before. Esophagus within normal limits. Lungs/Pleura: Small bilateral pleural effusions are new. Respiratory motion degradation. Small heterogeneous  foci of ground-glass density in the left  upper lobe and lower lobe. Musculoskeletal: Sternum is intact. Degenerative changes of the spine. No acute osseous abnormality. CT ABDOMEN PELVIS FINDINGS Hepatobiliary: No focal hepatic abnormality. Gallstones. No biliary dilatation. Pancreas: Unremarkable. No pancreatic ductal dilatation or surrounding inflammatory changes. Spleen: Normal in size without focal abnormality. Adrenals/Urinary Tract: Adrenal glands are within normal limits. Extensive intrarenal vascular calcifications. Mild atrophy of right kidney. Bilateral renal cysts for which no imaging follow-up is recommended. Possible hyperdense lesion at the upper pole of the left kidney, series 2, image 54, limited assessment without contrast and the images are degraded by motion. Increased density in the region of left renal collecting system, series 2, image 64, and within the left ureter which appears dilated. Small left kidney stones. Suprapubic bladder catheter with small amount of air in the bladder. Stomach/Bowel: The stomach is mildly distended. There is no dilated small bowel. No acute bowel wall thickening. Negative appendix. No acute bowel wall thickening. Vascular/Lymphatic: Advanced aortic atherosclerosis. No aneurysm. Enlarged appearing left retroperitoneal lymph nodes measuring up to 2.4 cm. Reproductive: Prostate is enlarged. Mass effect on the bladder. Large fluid collection in the right groin measuring 5.6 cm. Other: No free air. Small volume perihepatic ascites. Generalized mesenteric and subcutaneous edema. Musculoskeletal: No acute osseous abnormality. IMPRESSION: 1. Cardiomegaly with small bilateral pleural effusions. Small heterogeneous foci of ground-glass density in the left upper lobe and left lower lobe suspicious for pneumonia. 2. Increased density in the region of left renal collecting system and within the left ureter which appears dilated. Findings could be secondary to hemorrhage or infection. There are small left kidney  stones. 3. Suspicion of hyperdense renal lesions superior pole left kidney, further assessment difficult without contrast, correlation with renal ultrasound could be attempted. 4. Small volume perihepatic ascites. Generalized mesenteric and subcutaneous edema suggesting anasarca. 5. Enlarged prostate with mass effect on the bladder. 6. Redemonstrated enlarged appearing left retroperitoneal lymph nodes which are either reactive or neoplastic in etiology. 7. Large fluid collection within right groin hernia. 8. Gallstones. 9. Aortic atherosclerosis. Aortic Atherosclerosis (ICD10-I70.0). Electronically Signed   By: Donavan Foil M.D.   On: 04/07/2022 23:51   DG Chest Portable 1 View  Result Date: 04/07/2022 CLINICAL DATA:  Shortness of breath and cough EXAM: PORTABLE CHEST 1 VIEW COMPARISON:  03/17/2022 FINDINGS: Cardiac shadow is enlarged. Multiple calcified hilar lymph nodes are noted. Thoracic aorta is tortuous. Vascular congestion is seen with mild interstitial edema. Small right pleural effusion is noted. No acute bony abnormality is noted. IMPRESSION: Changes consistent with CHF with small left pleural effusion. Electronically Signed   By: Inez Catalina M.D.   On: 04/07/2022 23:15   DG Chest Port 1 View  Result Date: 03/17/2022 CLINICAL DATA:  Recently hospitalized for pneumonia, difficulty ambulating. EXAM: PORTABLE CHEST 1 VIEW COMPARISON:  01/31/2019 and CT chest 03/05/2022. FINDINGS: Patient's head obscures the superior mediastinum. Heart is enlarged. Calcified mediastinal and hilar lymph nodes. Tortuous thoracic aorta. Lungs are fairly low in volume. Probable small right pleural effusion. Old resection of the distal left clavicle. High-riding humeral heads bilaterally. IMPRESSION: Probable small right pleural effusion. Electronically Signed   By: Lorin Picket M.D.   On: 03/17/2022 13:22    PERFORMANCE STATUS (ECOG) : 4 - Bedbound  Review of Systems Unless otherwise noted, a complete review of  systems is negative.  Physical Exam General: Frail appearing Pulmonary: Unlabored Abdomen: soft, nontender, + bowel sounds GU: SP cath with dark colored urine Extremities:  no edema, no joint deformities Skin: no rashes Neurological: Confused  IMPRESSION: I met with patient's wife and son-in-law.  Together, we reviewed patient's clinical course over the past several months.  Family admits that patient has significantly declined since his hospitalization in October.  He has been severely debilitated since returning home from SNF with multiple ED visits.  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  We reviewed imaging together.  Patient is not felt to be a candidate for any cancer treatment.  His prognosis is poor.  Comfort care/hospice has been recommended.  Family spoke earlier with Dr. Leslye Peer and changed code status to DNR and are interested in pursuing hospice.  We discussed comfort care and hospice in detail.  Family report being shocked by patient's sudden decline and the news that his life expectancy is likely only measured in days-weeks.   Family confirm interest in [place].  I do feel like patient would be appropriate for inpatient hospice given his ongoing symptom needs such as pain management. Will start patient on IV hydromorphone as he is uncomfortable appearing currently.  PLAN: -Recommend hospice IPU -Start IV hydromorphone as needed -Agree with DNR/DNI  Case and plan discussed with Dr. Rogue Bussing. I also called and spoke with hospice physician, Dr. Lyman Speller.  Time Total: 60 minutes  Visit consisted of counseling and education dealing with the complex and emotionally intense issues of symptom management and palliative care in the setting of serious and potentially life-threatening illness.Greater than 50%  of this time was spent counseling and coordinating care related to the above assessment and plan.  Signed by: Altha Harm, PhD, NP-C

## 2022-04-14 NOTE — Care Management Important Message (Signed)
Important Message  Patient Details  Name: Troy Berry. MRN: [id number] Date of Birth: 1945-07-01   Medicare Important Message Given:  Other (see comment)  Disposition to discharge with hospice services.  Medicare IM withheld at this time out of respect for patient and family.    Dannette Barbara 04/14/2022, 2:23 PM

## 2022-04-14 NOTE — Plan of Care (Signed)

## 2022-04-14 NOTE — Assessment & Plan Note (Addendum)
Long discussion with family about goals of care.  Patient made DO NOT RESUSCITATE.  Will start comfort care measures.  Will go to the hospice home this evening.

## 2022-04-14 NOTE — Progress Notes (Addendum)
Manufacturing engineer [facility]) Hospital Liaison Note  Received request from Transitions of Care Manager Judson Roch for family interest in [place]. Visited patient at bedside and spoke with spouse/Faye & SIL/robert to confirm interest and explain services.  Approval for Hospice Home is determined by [facility] MD. Once [facility] MD has determined Hospice Home eligibility, [place] will update hospital staff and family.  Addendum: Bed offered to family today. However, family wanted to hold on consent completion until patients daughter is available.   Transfer is unable to occur until consents are complete.  Addendum: Family agreed to proceed with transfer tonight.  Consent forms to be completed  by SIL/Robert per spouse request..  EMS notified of patient D/C and transport arranged for 7pm pick up by MSW. TOC/Sarah and Attending Physician/Dr. Leslye Peer also notified of transport arrangement.   Please do not hesitate to call with any hospice related quesdtions.    Thank you for the opportunity to participate in this patient's care.  Daphene Calamity, MSW [facility] Liaison  [phone]

## 2022-04-14 NOTE — Assessment & Plan Note (Signed)
Potassium of 5.3 this morning.  Given a dose of Lokelma.

## 2022-04-14 NOTE — Discharge Summary (Signed)
Physician Discharge Summary   Patient: Troy Berry. MRN: [id number] DOB: Feb 17, 1946  Admit date:     04/07/2022  Discharge date: 04/14/22  Discharge Physician: Loletha Grayer   PCP: [facility]   Recommendations at discharge:   Follow-up at the hospice facility.  Discharge Diagnoses: Principal Problem:   End of life care Active Problems:   Metastatic cancer ([place])   Left kidney mass   Acute on chronic blood loss anemia   Hyponatremia   CAP (community acquired pneumonia)   CKD (chronic kidney disease), stage IV (HCC)   Hyperkalemia   Protein-calorie malnutrition, severe   Essential hypertension   Acute cystitis with hematuria   Memory loss   Gout   Dyslipidemia    Hospital Course: Taken from H&P.  Troy Berry. is a 77 y.o. African-American male with medical history significant for stage III CKD, GERD, hypertension anemia, and dementia who presented to the emergency room with acute onset of hematuria in the setting of a suprapubic catheter along with blood around the stools per his wife discovered in the diaper.  He has a history of ongoing gross hematuria.  No melena was reported.  No nausea or vomiting.  ED course.  On arrival stable vitals, respiratory panel negative.  Pertinent labs with hyponatremia with sodium of 115, do not from 132 on 29/51/8841, metabolic acidosis with bicarb of 16 and chloride of 91, worsening renal function with BUN 70 and creatinine of 4.04 as compared to 49/3.97 on 12/25.  Lactic acid at 2>> 1.5.  CBC with hemoglobin 5, it was 8 on 12/25. EKG is sinus rhythm, prolonged PR interval and chronic Q waves inferiorly. CT chest, abdomen and pelvis with 1. Cardiomegaly with small bilateral pleural effusions. Small heterogeneous foci of ground-glass density in the left upper lobe and left lower lobe suspicious for pneumonia. 2. Increased density in the region of left renal collecting system and within the left ureter  which appears dilated. Findings could be secondary to hemorrhage or infection. There are small left kidney stones. 3. Suspicion of hyperdense renal lesions superior pole left kidney, further assessment difficult without contrast, correlation with renal ultrasound could be attempted. 4. Small volume perihepatic ascites. Generalized mesenteric and subcutaneous edema suggesting anasarca. 5. Enlarged prostate with mass effect on the bladder. 6. Redemonstrated enlarged appearing left retroperitoneal lymph nodes which are either reactive or neoplastic in etiology. 7. Large fluid collection within right groin hernia. 8. Gallstones. 9. Aortic atherosclerosis.  Patient received IV fluid with normal saline, Rocephin and Zithromax for concern of And blood transfusions were ordered.  1/2: Vitals stable, sodium improved to 118.  TSH within normal limit.  Renal function with some improvement to 61/3.66.  Urology was consulted.  Hemoglobin increased to 8.2 after getting 2 unit of PRBC.  Patient clinically appears dry to euvolemic but BNP at 1834 , markedly elevated from prior check of 395 35-monthago.  No history of heart failure or prior echocardiogram.  Echocardiogram showed normal EF. 1/3.  IV iron given.  Hypertonic saline started for hyponatremia.  Sodium up to 121 1/4.  Seen by palliative care 1/5.  Hemoglobin 7.0.  Transfusion of 1 unit of packed red blood cells and IV iron today.  Urology ordered an MRI of the abdomen for further evaluation 1/6.  MRI of the abdomen showing left kidney mass going down into the ureter, possible liver metastases, retroperitoneal adenopathy and possible bone metastases.  Hemoglobin up to 7.7. 1/7.  Hemoglobin down  to 7.2.  Still with gross hematuria.  Will transfuse 1 unit of packed red blood cells.   1/8.  Long discussion with son-in-law and wife at the bedside.  Overall prognosis poor with metastatic cancer.  Patient will continue to bleed no matter what we do.  Not a  candidate for treatment with poor functional status.  Family has decided on hospice home.  1/8.  Addendum: Notified that family has accepted the hospice home bed and we do not have a bed for this evening.  Assessment and Plan: * End of life care Long discussion with family about goals of care.  Patient made DO NOT RESUSCITATE.  Will start comfort care measures.  Will go to the hospice home once patient's daughter is available.  Metastatic cancer [facility]) Patient will go to the hospice home.  Left kidney mass MRI of the abdomen shows a left kidney mass, liver metastases and bone metastases and retroperitoneal lymphadenopathy.  Case discussed with Dr. Rogue Bussing and patient is not a good candidate for systemic treatment and recommended hospice.  Family has decided on hospice home  Acute on chronic blood loss anemia Patient was transfused 4 units of packed red blood cells during the hospital course.  2 doses of IV Venofer on 1/3 and 1/5.  Family was interested in another transfusion today for hemoglobin of 7.4.  No further lab draws after this transfusion.  Patient will continue to bleed with the kidney mass  CAP (community acquired pneumonia) Completed Rocephin and Zithromax.  Hyponatremia With comfort care measures will discontinue salt tablets  Hyperkalemia Potassium of 5.3 this morning.  Given a dose of Lokelma.  No further lab draws.  CKD (chronic kidney disease), stage IV (HCC) Last creatinine 3.13 with a GFR of 20  Protein-calorie malnutrition, severe Continue supplements  Acute cystitis with hematuria Catheter related.  Klebsiella growing out of urine culture.  Sensitive to Rocephin.  Secondary to patient having a catheter, will have to give a longer course of antibiotics.  Switched Rocephin over to Keflex on 1/6.  Discontinue antibiotics with going to hospice.  Essential hypertension Hold Coreg with bradycardia.  Memory loss Suspect underlying dementia  Gout Discontinue  allopurinol  Dyslipidemia Discontinue statin         Consultants: Neurology, nephrology, oncology, palliative care and hospice Procedures performed: Foley catheter insertion Disposition: Hospice care Diet recommendation:  As tolerated.  Currently on a dysphagia 3 with thin liquids. DISCHARGE MEDICATION: Allergies as of 04/14/2022       Reactions   Ace Inhibitors    Other reaction(s): Other (See Comments) lip swelling        Medication List     STOP taking these medications    allopurinol 300 MG tablet Commonly known as: ZYLOPRIM   amLODipine 10 MG tablet Commonly known as: NORVASC   calcium citrate-vitamin D 315-200 MG-UNIT tablet Commonly known as: CITRACAL+D   carvedilol 25 MG tablet Commonly known as: COREG   cholecalciferol 25 MCG (1000 UNIT) tablet Commonly known as: VITAMIN D3   CRANBERRY-CALCIUM PO   FeroSul 325 (65 FE) MG tablet Generic drug: ferrous sulfate   finasteride 5 MG tablet Commonly known as: PROSCAR   furosemide 40 MG tablet Commonly known as: LASIX   lovastatin 40 MG tablet Commonly known as: MEVACOR   polyethylene glycol 17 g packet Commonly known as: MiraLax   sulfamethoxazole-trimethoprim 800-160 MG tablet Commonly known as: Bactrim DS       TAKE these medications    acetaminophen 325 MG  tablet Commonly known as: TYLENOL Take 2 tablets (650 mg total) by mouth every 6 (six) hours as needed for mild pain (or Fever >/= 101). What changed: Another medication with the same name was added. Make sure you understand how and when to take each.   acetaminophen 650 MG suppository Commonly known as: TYLENOL Place 1 suppository (650 mg total) rectally every 6 (six) hours as needed for mild pain (or Fever >/= 101). What changed: You were already taking a medication with the same name, and this prescription was added. Make sure you understand how and when to take each.   antiseptic oral rinse Liqd Apply 15 mLs topically as  needed for dry mouth.   Chlorhexidine Gluconate Cloth 2 % Pads Apply 6 each topically daily. Start taking on: April 15, 2022   feeding supplement Liqd Take 237 mLs by mouth 3 (three) times daily between meals.   glycopyrrolate 0.2 MG/ML injection Commonly known as: ROBINUL Inject 1 mL (0.2 mg total) into the vein every 4 (four) hours as needed (excessive secretions).   haloperidol lactate 5 MG/ML injection Commonly known as: HALDOL Inject 0.1 mLs (0.5 mg total) into the vein every 4 (four) hours as needed (or delirium).   HYDROmorphone 1 MG/ML injection Commonly known as: DILAUDID Inject 0.5 mLs (0.5 mg total) into the vein every 2 (two) hours as needed for severe pain (dyspnea).   ondansetron 4 MG/2ML Soln injection Commonly known as: ZOFRAN Inject 2 mLs (4 mg total) into the vein every 6 (six) hours as needed for nausea.   polyvinyl alcohol 1.4 % ophthalmic solution Commonly known as: LIQUIFILM TEARS Place 1 drop into both eyes 4 (four) times daily as needed for dry eyes.   traZODone 50 MG tablet Commonly known as: DESYREL Take 0.5 tablets (25 mg total) by mouth at bedtime as needed for sleep.               Durable Medical Equipment  (From admission, onward)           Start     Ordered   04/10/22 1151  For home use only DME Hospital bed  Once       Question Answer Comment  Length of Need Lifetime   Patient has (list medical condition): bedbound   The above medical condition requires: Patient requires the ability to reposition frequently   Head must be elevated greater than: 30 degrees   Bed type Semi-electric   Hoyer Lift Yes   Trapeze Bar Yes   Support Surface: Gel Overlay      04/10/22 1150            Follow-up Information     hospice facility Follow up in 1 day(s).                 Discharge Exam: Filed Weights   04/12/22 0415 04/13/22 0500 04/14/22 0539  Weight: 70.3 kg 72.3 kg 69.2 kg   Physical Exam HENT:     Head:  Normocephalic.     Mouth/Throat:     Pharynx: No oropharyngeal exudate.  Eyes:     General: Lids are normal.  Cardiovascular:     Rate and Rhythm: Normal rate and regular rhythm.     Heart sounds: Normal heart sounds, S1 normal and S2 normal.  Pulmonary:     Breath sounds: No decreased breath sounds, wheezing, rhonchi or rales.  Abdominal:     Palpations: Abdomen is soft.     Tenderness: There is no  abdominal tenderness.  Genitourinary:    Comments: Still with gross hematuria in the Foley Musculoskeletal:     Right lower leg: No swelling.     Left lower leg: No swelling.  Skin:    General: Skin is warm.     Findings: No rash.  Neurological:     Mental Status: He is alert.      Condition at discharge: fair  The results of significant diagnostics from this hospitalization (including imaging, microbiology, ancillary and laboratory) are listed below for reference.   Imaging Studies: MR ABDOMEN WO CONTRAST  Result Date: 04/12/2022 CLINICAL DATA:  Indeterminate renal mass/cyst. EXAM: MRI ABDOMEN WITHOUT CONTRAST TECHNIQUE: Multiplanar multisequence MR imaging was performed without the administration of intravenous contrast. COMPARISON:  CT AP 04/07/2022 FINDINGS: Exam detail is markedly diminished due to excessive motion artifact. Further, evaluation for solid organ pathology is limited reflecting lack of IV contrast material. Lower chest: Cardiac enlargement.  Bilateral pleural effusions. Hepatobiliary: Limited assessment due to excessive motion artifact. Diffusely heterogeneous appearance of the liver with multiple scattered areas of increased T2 signal. Reference lesion in segment 7 measures 1.7 x 1.5 cm, image 13/8. Reference lesion in segment 6 measures 1.5 x 1.2 cm, image 24/8. Multiple small stones are seen layering within the dependent portion of the gallbladder. Assessment of gallbladder wall thickness is limited due to motion artifact. No intrahepatic bile duct dilatation. Diffuse  periportal edema noted. No signs of common bile duct dilatation. Pancreas: No mass, inflammatory changes, or other parenchymal abnormality identified. Spleen: There is an indeterminate T2 hyperintense lesion within the spleen measures 1.7 cm. This contains a central area of low T2 signal. This is incompletely characterized without IV contrast. Adrenals/Urinary Tract: The adrenal glands are grossly unremarkable. Evaluation of the kidneys is markedly limited due to motion artifact. Bilateral kidney cysts identified. Asymmetric left-sided perinephric fat stranding noted. Within these limitations, there appears to be a mass occupying lesion within the upper pole and interpolar left kidney with extension into the collecting system and dilated left ureter, image 12/4 and image 27/8. Cannot excess patency of the left renal vein reflecting lack of IV contrast material and motion artifact. Urinary bladder is identified which appears partially decompressed around a suprapubic catheter and Foley catheter. Stomach/Bowel: No pathologic dilatation of the large or small bowel loops. Vascular/Lymphatic: No signs of infrarenal abdominal aortic aneurysm. Aortic atherosclerosis. Retroperitoneal adenopathy is identified: Reference left periaortic node measures 1.7 cm, image 17/8. Unchanged. Reference left periaortic node measures 2 cm, image 21/8. Unchanged. Other:  Small volume of abdominal ascites. Musculoskeletal: The bone marrow appears diffusely abnormal with multiple lesions throughout the lumbar spine and visualized bony pelvis worrisome for bone metastases. Reference lesion within the L4 vertebral body measures 1.8 cm, image 30/8. IMPRESSION: 1. Exam detail is markedly diminished due to excessive motion artifact. Further, evaluation for solid organ pathology is limited reflecting lack of IV contrast material. 2. There appears to be a mass occupying lesion within the upper pole and interpolar left kidney with extension into the  collecting system and dilated left ureter. Cannot excess patency of the left renal vein reflecting lack of IV contrast material and motion artifact. Imaging findings are suspicious for underlying malignancy. 3. Diffusely heterogeneous appearance of the liver with multiple scattered areas of increased T2 signal. Although incompletely characterized without IV contrast, findings are suspicious for diffuse liver metastases. 4. Retroperitoneal adenopathy. 5. The bone marrow appears diffusely abnormal with multiple lesions throughout the lumbar spine and visualized bony pelvis worrisome  for bone metastases. 6. Small volume of abdominal ascites. This may be secondary to fluid overload. 7. Cardiac enlargement and bilateral pleural effusions concerning for CHF. Bilateral pleural effusions. 8. Cholelithiasis. Electronically Signed   By: Kerby Moors M.D.   On: 04/12/2022 08:20   ECHOCARDIOGRAM COMPLETE  Result Date: 04/09/2022    ECHOCARDIOGRAM REPORT   Patient Name:   Troy Berry. Date of Exam: 04/09/2022 Medical Rec #:  [id number]            Height:       68.0 in Accession #:    [id number]           Weight:       148.0 lb Date of Birth:  [birth year]            BSA:          1.798 m Patient Age:    77 years             BP:           126/52 mmHg Patient Gender: M                    HR:           52 bpm. Exam Location:  [facility] Procedure: 2D Echo, Cardiac Doppler and Color Doppler Indications:     Abnormal ECG R94.31  History:         Patient has no prior history of Echocardiogram examinations.                  Risk Factors:Hypertension. CKD.  Sonographer:     Sherrie Sport Referring Phys:  [id number] [facility] AMIN Diagnosing Phys: Ida Rogue MD  Sonographer Comments: Suboptimal parasternal window. IMPRESSIONS  1. Challenging images  2. Left ventricular ejection fraction, by estimation, is 55 to 60%. The left ventricle has normal function. The left ventricle has no regional wall motion abnormalities. There is mild left  ventricular hypertrophy, unable to exclude moderate hypertrophy of the inferior and lateral wall. Left ventricular diastolic parameters are consistent with Grade I diastolic dysfunction (impaired relaxation).  3. Right ventricular systolic function is mildly reduced, though not well visualized. The right ventricular size is normal. There is normal pulmonary artery systolic pressure. The estimated right ventricular systolic pressure is 43.3 mmHg.  4. Left atrial size was mild to moderately dilated.  5. The mitral valve is normal in structure. Moderate mitral valve regurgitation. No evidence of mitral stenosis.  6. The aortic valve is normal in structure. Aortic valve regurgitation is moderate to severe. No aortic stenosis is present.  7. There is severe dilatation of the aortic root, measuring 49 mm.Ascending aorta not well visulized.  8. The inferior vena cava is normal in size with greater than 50% respiratory variability, suggesting right atrial pressure of 3 mmHg. FINDINGS  Left Ventricle: Left ventricular ejection fraction, by estimation, is 55 to 60%. The left ventricle has normal function. The left ventricle has no regional wall motion abnormalities. The left ventricular internal cavity size was normal in size. There is  mild left ventricular hypertrophy. Left ventricular diastolic parameters are consistent with Grade I diastolic dysfunction (impaired relaxation). Right Ventricle: The right ventricular size is normal. No increase in right ventricular wall thickness. Right ventricular systolic function is mildly reduced. There is normal pulmonary artery systolic pressure. The tricuspid regurgitant velocity is 2.78 m/s, and with an assumed right atrial pressure of 5 mmHg, the estimated right ventricular systolic pressure is 35.9  mmHg. Left Atrium: Left atrial size was mild to moderately dilated. Right Atrium: Right atrial size was normal in size. Pericardium: There is no evidence of pericardial effusion. Mitral  Valve: The mitral valve is normal in structure. There is mild calcification of the mitral valve leaflet(s). Mild mitral annular calcification. Moderate mitral valve regurgitation. No evidence of mitral valve stenosis. Tricuspid Valve: The tricuspid valve is normal in structure. Tricuspid valve regurgitation is not demonstrated. No evidence of tricuspid stenosis. Aortic Valve: The aortic valve is normal in structure. Aortic valve regurgitation is moderate to severe. Aortic regurgitation PHT measures 433 msec. No aortic stenosis is present. Aortic valve mean gradient measures 4.0 mmHg. Aortic valve peak gradient measures 6.7 mmHg. Aortic valve area, by VTI measures 2.68 cm. Pulmonic Valve: The pulmonic valve was normal in structure. Pulmonic valve regurgitation is not visualized. No evidence of pulmonic stenosis. Aorta: The aortic root is normal in size and structure. There is severe dilatation of the aortic root, measuring 49 mm. Venous: The inferior vena cava is normal in size with greater than 50% respiratory variability, suggesting right atrial pressure of 3 mmHg. IAS/Shunts: No atrial level shunt detected by color flow Doppler.  LEFT VENTRICLE PLAX 2D LVIDd:         4.90 cm   Diastology LVIDs:         3.50 cm   LV e' medial:    5.22 cm/s LV PW:         1.10 cm   LV E/e' medial:  14.9 LV IVS:        1.20 cm   LV e' lateral:   10.80 cm/s LVOT diam:     2.10 cm   LV E/e' lateral: 7.2 LV SV:         81 LV SV Index:   45 LVOT Area:     3.46 cm  RIGHT VENTRICLE RV Basal diam:  2.50 cm RV Mid diam:    3.10 cm RV S prime:     15.90 cm/s TAPSE (M-mode): 1.9 cm LEFT ATRIUM           Index        RIGHT ATRIUM           Index LA diam:      4.10 cm 2.28 cm/m   RA Area:     10.80 cm LA Vol (A4C): 76.2 ml 42.37 ml/m  RA Volume:   15.90 ml  8.84 ml/m  AORTIC VALVE AV Area (Vmax):    3.52 cm AV Area (Vmean):   2.98 cm AV Area (VTI):     2.68 cm AV Vmax:           129.00 cm/s AV Vmean:          95.200 cm/s AV VTI:             0.301 m AV Peak Grad:      6.7 mmHg AV Mean Grad:      4.0 mmHg LVOT Vmax:         131.00 cm/s LVOT Vmean:        81.800 cm/s LVOT VTI:          0.233 m LVOT/AV VTI ratio: 0.77 AI PHT:            433 msec  AORTA Ao Root diam: 5.07 cm MITRAL VALVE                TRICUSPID VALVE MV Area (PHT): 2.70 cm  TR Peak grad:   30.9 mmHg MV Decel Time: 281 msec     TR Vmax:        278.00 cm/s MV E velocity: 77.60 cm/s MV A velocity: 123.00 cm/s  SHUNTS MV E/A ratio:  0.63         Systemic VTI:  0.23 m                             Systemic Diam: 2.10 cm Ida Rogue MD Electronically signed by Ida Rogue MD Signature Date/Time: 04/09/2022/12:44:09 PM    Final    US RENAL  Result Date: 04/08/2022 CLINICAL DATA:  Hydronephrosis, left.  Renal mass on CT. EXAM: RENAL / URINARY TRACT ULTRASOUND COMPLETE COMPARISON:  Noncontrast abdominal CT 04/07/2022 and 03/05/2022. Renal ultrasound 12/21/2017. FINDINGS: Right Kidney: Renal measurements: 10.1 x 4.0 x 4.7 cm = volume: 99.1 mL. Cortical thinning with increased cortical echogenicity. Small renal cysts measuring up to 1.8 cm in diameter. No hydronephrosis. Left Kidney: Renal measurements: 12.3 x 6.1 x 5.7 cm = volume: 221.4 mL. Multiple cysts are noted, measuring up to 4.2 cm in the upper pole. No discrete mass is seen to correspond with the abnormality on recent CT. Bladder: Suprapubic catheter in place. The urinary bladder is mildly distended. Other: Ascites and a small left pleural effusion are noted. The prostate gland is moderately enlarged. Numerous hypoechoic liver lesions are noted, incompletely characterized. Examination limited by patient condition. IMPRESSION: 1. No discrete left renal mass identified by ultrasound to correspond with the abnormality on recent CT's. CT finding remains indeterminate and could reflect parenchymal hemorrhage or a hemorrhagic mass. Recommend further evaluation with dedicated renal CT or MRI (without and with contrast). 2. Bilateral  renal cysts. 3. Right renal cortical thinning and increased cortical echogenicity, nonspecific but can be seen with chronic medical renal disease. 4. Ascites and small left pleural effusion. 5. Numerous hypoechoic liver lesions, incompletely characterized. Electronically Signed   By: Richardean Sale M.D.   On: 04/08/2022 10:33   CT CHEST ABDOMEN PELVIS WO CONTRAST  Result Date: 04/07/2022 CLINICAL DATA:  Cough EXAM: CT CHEST, ABDOMEN AND PELVIS WITHOUT CONTRAST TECHNIQUE: Multidetector CT imaging of the chest, abdomen and pelvis was performed following the standard protocol without IV contrast. RADIATION DOSE REDUCTION: This exam was performed according to the departmental dose-optimization program which includes automated exposure control, adjustment of the mA and/or kV according to patient size and/or use of iterative reconstruction technique. COMPARISON:  Chest x-ray 04/07/2022, CT 03/05/2022 FINDINGS: CT CHEST FINDINGS Cardiovascular: Limited evaluation without intravenous contrast. Advanced aortic atherosclerosis. 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 Extensive coronary vascular calcifications. Cardiomegaly. No pericardial effusion. Mediastinum/Nodes: Midline trachea. No thyroid mass. Numerous calcified mediastinal and hilar nodes as before. Esophagus within normal limits. Lungs/Pleura: Small bilateral pleural effusions are new. Respiratory motion degradation. Small heterogeneous foci of ground-glass density in the left upper lobe and lower lobe. Musculoskeletal: Sternum is intact. Degenerative changes of the spine. No acute osseous abnormality. CT ABDOMEN PELVIS FINDINGS Hepatobiliary: No focal hepatic abnormality. Gallstones. No biliary dilatation. Pancreas: Unremarkable. No pancreatic ductal dilatation or surrounding inflammatory changes. Spleen: Normal in size without focal abnormality. Adrenals/Urinary Tract: Adrenal glands are within normal limits. Extensive intrarenal  vascular calcifications. Mild atrophy of right kidney. Bilateral renal cysts for which no imaging follow-up is recommended. Possible hyperdense lesion at the upper pole of the left kidney, series 2, image 54, limited assessment without contrast  and the images are degraded by motion. Increased density in the region of left renal collecting system, series 2, image 64, and within the left ureter which appears dilated. Small left kidney stones. Suprapubic bladder catheter with small amount of air in the bladder. Stomach/Bowel: The stomach is mildly distended. There is no dilated small bowel. No acute bowel wall thickening. Negative appendix. No acute bowel wall thickening. Vascular/Lymphatic: Advanced aortic atherosclerosis. No aneurysm. Enlarged appearing left retroperitoneal lymph nodes measuring up to 2.4 cm. Reproductive: Prostate is enlarged. Mass effect on the bladder. Large fluid collection in the right groin measuring 5.6 cm. Other: No free air. Small volume perihepatic ascites. Generalized mesenteric and subcutaneous edema. Musculoskeletal: No acute osseous abnormality. IMPRESSION: 1. Cardiomegaly with small bilateral pleural effusions. Small heterogeneous foci of ground-glass density in the left upper lobe and left lower lobe suspicious for pneumonia. 2. Increased density in the region of left renal collecting system and within the left ureter which appears dilated. Findings could be secondary to hemorrhage or infection. There are small left kidney stones. 3. Suspicion of hyperdense renal lesions superior pole left kidney, further assessment difficult without contrast, correlation with renal ultrasound could be attempted. 4. Small volume perihepatic ascites. Generalized mesenteric and subcutaneous edema suggesting anasarca. 5. Enlarged prostate with mass effect on the bladder. 6. Redemonstrated enlarged appearing left retroperitoneal lymph nodes which are either reactive or neoplastic in etiology. 7. Large  fluid collection within right groin hernia. 8. Gallstones. 9. Aortic atherosclerosis. Aortic Atherosclerosis (ICD10-I70.0). Electronically Signed   By: Donavan Foil M.D.   On: 04/07/2022 23:51   DG Chest Portable 1 View  Result Date: 04/07/2022 CLINICAL DATA:  Shortness of breath and cough EXAM: PORTABLE CHEST 1 VIEW COMPARISON:  03/17/2022 FINDINGS: Cardiac shadow is enlarged. Multiple calcified hilar lymph nodes are noted. Thoracic aorta is tortuous. Vascular congestion is seen with mild interstitial edema. Small right pleural effusion is noted. No acute bony abnormality is noted. IMPRESSION: Changes consistent with CHF with small left pleural effusion. Electronically Signed   By: Inez Catalina M.D.   On: 04/07/2022 23:15   DG Chest Port 1 View  Result Date: 03/17/2022 CLINICAL DATA:  Recently hospitalized for pneumonia, difficulty ambulating. EXAM: PORTABLE CHEST 1 VIEW COMPARISON:  01/31/2019 and CT chest 03/05/2022. FINDINGS: Patient's head obscures the superior mediastinum. Heart is enlarged. Calcified mediastinal and hilar lymph nodes. Tortuous thoracic aorta. Lungs are fairly low in volume. Probable small right pleural effusion. Old resection of the distal left clavicle. High-riding humeral heads bilaterally. IMPRESSION: Probable small right pleural effusion. Electronically Signed   By: Lorin Picket M.D.   On: 03/17/2022 13:22    Microbiology: Results for orders placed or performed during the hospital encounter of 04/07/22  Resp panel by RT-PCR (RSV, Flu A&B, Covid) Anterior Nasal Swab     Status: None   Collection Time: 04/07/22 10:58 PM   Specimen: Anterior Nasal Swab  Result Value Ref Range Status   SARS Coronavirus 2 by RT PCR NEGATIVE NEGATIVE Final    Comment: (NOTE) SARS-CoV-2 target nucleic acids are NOT DETECTED.  The SARS-CoV-2 RNA is generally detectable in upper respiratory specimens during the acute phase of infection. The lowest concentration of SARS-CoV-2 viral copies  this assay can detect is 138 copies/mL. A negative result does not preclude SARS-Cov-2 infection and should not be used as the sole basis for treatment or other patient management decisions. A negative result may occur with  improper specimen collection/handling, submission of specimen other than nasopharyngeal swab,  presence of viral mutation(s) within the areas targeted by this assay, and inadequate number of viral copies(<138 copies/mL). A negative result must be combined with clinical observations, patient history, and epidemiological information. The expected result is Negative.  Fact Sheet for Patients:  EntrepreneurPulse.com.au  Fact Sheet for Healthcare Providers:  IncredibleEmployment.be  This test is no t yet approved or cleared by the [place] FDA and  has been authorized for detection and/or diagnosis of SARS-CoV-2 by FDA under an Emergency Use Authorization (EUA). This EUA will remain  in effect (meaning this test can be used) for the duration of the COVID-19 declaration under Section 564(b)(1) of the Act, 21 U.S.C.section 360bbb-3(b)(1), unless the authorization is terminated  or revoked sooner.       Influenza A by PCR NEGATIVE NEGATIVE Final   Influenza B by PCR NEGATIVE NEGATIVE Final    Comment: (NOTE) The Xpert Xpress SARS-CoV-2/FLU/RSV plus assay is intended as an aid in the diagnosis of influenza from Nasopharyngeal swab specimens and should not be used as a sole basis for treatment. Nasal washings and aspirates are unacceptable for Xpert Xpress SARS-CoV-2/FLU/RSV testing.  Fact Sheet for Patients: EntrepreneurPulse.com.au  Fact Sheet for Healthcare Providers: IncredibleEmployment.be  This test is not yet approved or cleared by the [place] FDA and has been authorized for detection and/or diagnosis of SARS-CoV-2 by FDA under an Emergency Use Authorization (EUA). This EUA  will remain in effect (meaning this test can be used) for the duration of the COVID-19 declaration under Section 564(b)(1) of the Act, 21 U.S.C. section 360bbb-3(b)(1), unless the authorization is terminated or revoked.     Resp Syncytial Virus by PCR NEGATIVE NEGATIVE Final    Comment: (NOTE) Fact Sheet for Patients: EntrepreneurPulse.com.au  Fact Sheet for Healthcare Providers: IncredibleEmployment.be  This test is not yet approved or cleared by the [place] FDA and has been authorized for detection and/or diagnosis of SARS-CoV-2 by FDA under an Emergency Use Authorization (EUA). This EUA will remain in effect (meaning this test can be used) for the duration of the COVID-19 declaration under Section 564(b)(1) of the Act, 21 U.S.C. section 360bbb-3(b)(1), unless the authorization is terminated or revoked.  Performed at [facility], [address] [place], [place] [address]   Urine Culture     Status: Abnormal   Collection Time: 04/07/22 11:36 PM   Specimen: Urine, Clean Catch  Result Value Ref Range Status   Specimen Description   Final    URINE, CLEAN CATCH Performed at [facility], [address], [place], [place] [address]    Special Requests   Final    NONE Performed at [facility], [place], [place] [address]    Culture >=100,000 COLONIES/mL KLEBSIELLA PNEUMONIAE (A)  Final   Report Status 04/10/2022 FINAL  Final   Organism ID, Bacteria KLEBSIELLA PNEUMONIAE (A)  Final      Susceptibility   Klebsiella pneumoniae - MIC*    AMPICILLIN >=32 RESISTANT Resistant     CEFAZOLIN <=4 SENSITIVE Sensitive     CEFEPIME <=0.12 SENSITIVE Sensitive     CEFTRIAXONE <=0.25 SENSITIVE Sensitive     CIPROFLOXACIN <=0.25 SENSITIVE Sensitive     GENTAMICIN <=1 SENSITIVE Sensitive     IMIPENEM <=0.25 SENSITIVE Sensitive     NITROFURANTOIN 64 INTERMEDIATE Intermediate     TRIMETH/SULFA >=320  RESISTANT Resistant     AMPICILLIN/SULBACTAM 4 SENSITIVE Sensitive     PIP/TAZO <=4 SENSITIVE Sensitive     * >=100,000 COLONIES/mL KLEBSIELLA  PNEUMONIAE    Labs: CBC: Recent Labs  Lab 04/07/22 2258 04/08/22 0933 04/09/22 1316 04/10/22 0720 04/11/22 0235 04/11/22 1620 04/12/22 0446 04/13/22 0436 04/14/22 0400  WBC 8.7 9.6 8.9 7.6  --   --  13.0*  --   --   NEUTROABS  --   --  7.1  --   --   --   --   --   --   HGB 5.0* 8.2* 7.9* 8.2* 7.0* 7.5* 7.7* 7.2* 7.4*  HCT 15.4* 24.3* 23.5* 24.4*  --   --  23.0*  --   --   MCV 87.0 86.8 87.0 87.8  --   --  87.1  --   --   PLT 118* 125* 134* 147*  --   --  131*  --   --    Basic Metabolic Panel: Recent Labs  Lab 04/10/22 0720 04/10/22 1107 04/11/22 0235 04/11/22 0709 04/11/22 1109 04/11/22 1620 04/12/22 0446 04/13/22 0436 04/14/22 0400  NA 125*   < > 129*   < > 131* 132* 134* 137 137  K 5.1  --  4.7  --   --   --  5.0 5.0 5.3*  CL 103  --  107  --   --   --  111 114* 113*  CO2 13*  --  16*  --   --   --  16* 17* 17*  GLUCOSE 79  --  111*  --   --   --  69* 102* 102*  BUN 65*  --  68*  --   --   --  66* 69* 75*  CREATININE 3.44*  --  3.32*  --   --   --  3.22* 3.11* 3.13*  CALCIUM 9.0  --  9.1  --   --   --  9.7 10.1 10.1   < > = values in this interval not displayed.   Liver Function Tests: Recent Labs  Lab 04/07/22 2258  AST 41  ALT 31  ALKPHOS 253*  BILITOT 0.6  PROT 5.2*  ALBUMIN 2.1*     Discharge time spent: greater than 30 minutes.  Signed: Loletha Grayer, MD [facility] 04/14/2022

## 2022-04-14 NOTE — Progress Notes (Signed)
Progress Note   Patient: Troy Berry. KDX:[id number] DOB: [birth year] DOA: 04/07/2022     6 DOS: the patient was seen and examined on 04/14/2022   Brief hospital course: Taken from H&P.  Troy Berry. is a 77 y.o. African-American male with medical history significant for stage III CKD, GERD, hypertension anemia, and dementia who presented to the emergency room with acute onset of hematuria in the setting of a suprapubic catheter along with blood around the stools per his wife discovered in the diaper.  He has a history of ongoing gross hematuria.  No melena was reported.  No nausea or vomiting.  ED course.  On arrival stable vitals, respiratory panel negative.  Pertinent labs with hyponatremia with sodium of 115, do not from 132 on 97/67/3419, metabolic acidosis with bicarb of 16 and chloride of 91, worsening renal function with BUN 70 and creatinine of 4.04 as compared to 49/3.97 on 12/25.  Lactic acid at 2>> 1.5.  CBC with hemoglobin 5, it was 8 on 12/25. EKG is sinus rhythm, prolonged PR interval and chronic Q waves inferiorly. CT chest, abdomen and pelvis with 1. Cardiomegaly with small bilateral pleural effusions. Small heterogeneous foci of ground-glass density in the left upper lobe and left lower lobe suspicious for pneumonia. 2. Increased density in the region of left renal collecting system and within the left ureter which appears dilated. Findings could be secondary to hemorrhage or infection. There are small left kidney stones. 3. Suspicion of hyperdense renal lesions superior pole left kidney, further assessment difficult without contrast, correlation with renal ultrasound could be attempted. 4. Small volume perihepatic ascites. Generalized mesenteric and subcutaneous edema suggesting anasarca. 5. Enlarged prostate with mass effect on the bladder. 6. Redemonstrated enlarged appearing left retroperitoneal lymph nodes which are either reactive or neoplastic in  etiology. 7. Large fluid collection within right groin hernia. 8. Gallstones. 9. Aortic atherosclerosis.  Patient received IV fluid with normal saline, Rocephin and Zithromax for concern of And blood transfusions were ordered.  1/2: Vitals stable, sodium improved to 118.  TSH within normal limit.  Renal function with some improvement to 61/3.66.  Urology was consulted.  Hemoglobin increased to 8.2 after getting 2 unit of PRBC.  Patient clinically appears dry to euvolemic but BNP at 1834 , markedly elevated from prior check of 395 37-monthago.  No history of heart failure or prior echocardiogram.  Echocardiogram showed normal EF. 1/3.  IV iron given.  Hypertonic saline started for hyponatremia.  Sodium up to 121 1/4.  Seen by palliative care 1/5.  Hemoglobin 7.0.  Transfusion of 1 unit of packed red blood cells and IV iron today.  Urology ordered an MRI of the abdomen for further evaluation 1/6.  MRI of the abdomen showing left kidney mass going down into the ureter, possible liver metastases, retroperitoneal adenopathy and possible bone metastases.  Hemoglobin up to 7.7. 1/7.  Hemoglobin down to 7.2.  Still with gross hematuria.  Will transfuse 1 unit of packed red blood cells.   1/8.  Long discussion with son-in-law and wife at the bedside.  Overall prognosis poor with metastatic cancer.  Patient will continue to bleed no matter what we do.  Not a candidate for treatment with poor functional status.  Family has decided on hospice home.  Assessment and Plan: * End of life care Long discussion with family about goals of care.  Patient made DO NOT RESUSCITATE.  Will start comfort care measures.  Will go to the  hospice home once patient's daughter is available.  Metastatic cancer [facility]) Patient will go to the hospice home.  Left kidney mass MRI of the abdomen shows a left kidney mass, liver metastases and bone metastases and retroperitoneal lymphadenopathy.  Case discussed with Dr. Rogue Bussing and  patient is not a good candidate for systemic treatment and recommended hospice.  Family has decided on hospice home  Acute on chronic blood loss anemia Patient was transfused 4 units of packed red blood cells during the hospital course.  2 doses of IV Venofer on 1/3 and 1/5.  Family was interested in another transfusion today for hemoglobin of 7.4.  CAP (community acquired pneumonia) Completed Rocephin and Zithromax.  Hyponatremia With comfort care measures will discontinue salt tablets  Hyperkalemia Potassium of 5.3 this morning.  Given a dose of Lokelma.  CKD (chronic kidney disease), stage IV (HCC) Last creatinine 3.13 with a GFR of 20  Protein-calorie malnutrition, severe Continue supplements  Acute cystitis with hematuria Catheter related.  Klebsiella growing out of urine culture.  Sensitive to Rocephin.  Secondary to patient having a catheter, will have to give a longer course of antibiotics.  Switched Rocephin over to Keflex on 1/6.  Discontinue antibiotics with going to hospice.  Essential hypertension Hold Coreg with bradycardia.  Memory loss Suspect underlying dementia  Gout Discontinue allopurinol  Dyslipidemia Discontinue statin        Subjective: Patient unable to precipitate in goals of care conversation.  Not complaining of any pain when I saw him.  Physical Exam: Vitals:   04/14/22 0539 04/14/22 0539 04/14/22 0814 04/14/22 1508  BP: (!) 151/85 (!) 151/85 (!) 154/66 (!) 114/51  Pulse: 82 82 71 77  Resp: '20 20 20   '$ Temp: 97.8 F (36.6 C) 97.8 F (36.6 C) 98.2 F (36.8 C) 98.4 F (36.9 C)  TempSrc: Axillary Axillary Oral Oral  SpO2: 100% 100% 100% 98%  Weight: 69.2 kg     Height:       Physical Exam HENT:     Head: Normocephalic.     Mouth/Throat:     Pharynx: No oropharyngeal exudate.  Eyes:     General: Lids are normal.  Cardiovascular:     Rate and Rhythm: Normal rate and regular rhythm.     Heart sounds: Normal heart sounds, S1  normal and S2 normal.  Pulmonary:     Breath sounds: No decreased breath sounds, wheezing, rhonchi or rales.  Abdominal:     Palpations: Abdomen is soft.     Tenderness: There is no abdominal tenderness.  Genitourinary:    Comments: Still with gross hematuria in the Foley Musculoskeletal:     Right lower leg: No swelling.     Left lower leg: No swelling.  Skin:    General: Skin is warm.     Findings: No rash.  Neurological:     Mental Status: He is alert.     Data Reviewed: Potassium 5.3, creatinine 3.13, hemoglobin 7.4  Family Communication: Spoke with son-in-law and wife at the bedside  Disposition: Status is: Inpatient Remains inpatient appropriate because: Patient will be converted to end-of-life care and comfort measures and go to the hospice home. Planned Discharge Destination: Hospice home once daughter available and hopefully hospice will have a bed at that time.    Time spent: 45 minutes Spoke with nursing staff, oncology, palliative care, [facility], hospice liaison  Author: Loletha Grayer, MD 04/14/2022 3:36 PM  For on call review [email or web address].

## 2022-04-14 NOTE — Assessment & Plan Note (Signed)
Patient will go to the hospice home.

## 2022-04-14 NOTE — Progress Notes (Signed)
Physical Therapy Treatment Patient Details Name: Troy Berry. MRN: [id number] DOB: 01-01-1946 Today's Date: 04/14/2022   History of Present Illness Troy Berry. is a 77 y.o. male with a past medical history of anemia, CKD, hypertension, presents to the emergency department from home for shortness of breath and cough.  According to the family members patient is largely cared for by home health workers as the patient is bedbound.  States since this morning he has a very wet sounding cough and appears to have difficulty breathing at times.  They also state the patient's catheter was not draining correctly although currently is draining urine.  Patient has chronic hematuria per family for at least the last several months this is not new.    PT Comments    Pt generally improved today per RN.  Pt is awake but keeps eyes closed through most of session.  Answers yes/no on occasion but does not expand.  BLE AA/PROM x 10 for general comfort.  Pt helps very minimally but does not resist.  Does not assist in repositioning in bed and requires max A/dependant.  Appears generally comfortable today.  Per notes pt was bedbound prior to admission with [facility] and care by family as needed.  Pt is appropriate to return home with 24 hour care.  Would benefit from a hospital bed if he did not have one previously.     Recommendations for follow up therapy are one component of a multi-disciplinary discharge planning process, led by the attending physician.  Recommendations may be updated based on patient status, additional functional criteria and insurance authorization.  Follow Up Recommendations  Home health PT     Assistance Recommended at Discharge Frequent or constant Supervision/Assistance  Patient can return home with the following A lot of help with walking and/or transfers;A lot of help with bathing/dressing/bathroom;Assistance with feeding;Assist for transportation;Help with stairs or ramp for  entrance;Assistance with cooking/housework   Equipment Recommendations  Hospital bed    Recommendations for Other Services       Precautions / Restrictions Precautions Precautions: Fall Restrictions Weight Bearing Restrictions: No     Mobility  Bed Mobility Overal bed mobility: Needs Assistance Bed Mobility: Rolling Rolling: Total assist         General bed mobility comments: total assist to reposition    Transfers                        Ambulation/Gait                   Stairs             Wheelchair Mobility    Modified Rankin (Stroke Patients Only)       Balance                                            Cognition Arousal/Alertness: Lethargic Behavior During Therapy: Flat affect Overall Cognitive Status: History of cognitive impairments - at baseline                                 General Comments: answers yes/no but doesn't expand        Exercises Other Exercises Other Exercises: BLE AAROM and repositioning    General Comments  Pertinent Vitals/Pain Pain Assessment Pain Assessment: No/denies pain Pain Location: appears generally comfortable. Pain Intervention(s): Monitored during session, Repositioned    Home Living                          Prior Function            PT Goals (current goals can now be found in the care plan section) Progress towards PT goals: Not progressing toward goals - comment    Frequency    Min 2X/week      PT Plan Current plan remains appropriate    Co-evaluation              AM-PAC PT "6 Clicks" Mobility   Outcome Measure  Help needed turning from your back to your side while in a flat bed without using bedrails?: A Lot Help needed moving from lying on your back to sitting on the side of a flat bed without using bedrails?: A Lot Help needed moving to and from a bed to a chair (including a wheelchair)?: Total Help  needed standing up from a chair using your arms (e.g., wheelchair or bedside chair)?: A Lot Help needed to walk in hospital room?: Total Help needed climbing 3-5 steps with a railing? : Total 6 Click Score: 9    End of Session   Activity Tolerance: Patient limited by lethargy Patient left: in bed;with call bell/phone within reach;with bed alarm set Nurse Communication: Mobility status PT Visit Diagnosis: Muscle weakness (generalized) (M62.81)     Time: 9983-3825 PT Time Calculation (min) (ACUTE ONLY): 8 min  Charges:  $Therapeutic Exercise: 8-22 mins                   Chesley Noon, PTA 04/14/22, 9:14 AM

## 2022-04-14 NOTE — Progress Notes (Signed)
  Interdisciplinary Goals of Care Family Meeting   Date carried out: 04/14/2022  Location of the meeting: Bedside  Member's involved: Physician and Family Member or next of kin patient's son-in-law and wife at the bedside  Durable Power of Attorney or acting medical decision maker: Son-in-law to assist in making decisions.  Discussion: We discussed goals of care for Troy Berry. with suspected metastatic cancer with large kidney mass that continues to bleed.  Requiring multiple transfusions during this hospital course.  Not a candidate for chemotherapy or systemic treatment with poor functional status including being nonambulatory and suspected dementia.  Patient would likely bounce back and forth into the hospital for repeated transfusions.  Overall prognosis poor.  Discussed hospice facility and DO NOT RESUSCITATE status.  Code status:   Code Status: DNR   Disposition: Hospice facility  Time spent for the meeting: 30 minutes    Loletha Grayer, MD  04/14/2022, 2:42 PM

## 2022-04-15 LAB — BPAM RBC
Blood Product Expiration Date: 202401262359
Blood Product Expiration Date: 202402122359
Blood Product Expiration Date: 202402122359
ISSUE DATE / TIME: 202401051152
ISSUE DATE / TIME: 202401071428
ISSUE DATE / TIME: 202401081520
Unit Type and Rh: 6200
Unit Type and Rh: 6200
Unit Type and Rh: 6200

## 2022-04-15 LAB — TYPE AND SCREEN
ABO/RH(D): A POS
Antibody Screen: NEGATIVE
Unit division: 0
Unit division: 0
Unit division: 0

## 2022-04-15 LAB — CYTOLOGY - NON PAP

## 2022-04-22 ENCOUNTER — Telehealth: Payer: Self-pay | Admitting: Physician Assistant

## 2022-04-22 NOTE — Telephone Encounter (Signed)
Sauna from [facility] called.  She is waiting on the status for order [id number] for pt Troy Berry public cath.  [phone].

## 2022-04-23 NOTE — Telephone Encounter (Signed)
Spoke with Davy Pique at [facility] and they faxed over a order on 04/04/2022 for Dr. Bernardo Heater to sign for order to flush the SPT tube.  ( Pt is now deceased)

## 2022-05-06 ENCOUNTER — Ambulatory Visit: Payer: Medicare HMO | Admitting: Physician Assistant

## 2022-05-07 ENCOUNTER — Other Ambulatory Visit: Payer: Medicare HMO | Admitting: Urology

## 2022-05-08 DEATH — deceased
